# Patient Record
Sex: Female | Born: 1970 | Race: Black or African American | Hispanic: No | Marital: Single | Smoking: Current every day smoker
Health system: Southern US, Community
[De-identification: ages and names within clinical notes are randomized; demographics above are authoritative.]

## PROBLEM LIST (undated history)

## (undated) DIAGNOSIS — I10 Essential (primary) hypertension: Secondary | ICD-10-CM

## (undated) DIAGNOSIS — B029 Zoster without complications: Secondary | ICD-10-CM

## (undated) DIAGNOSIS — R51 Headache: Secondary | ICD-10-CM

## (undated) DIAGNOSIS — I639 Cerebral infarction, unspecified: Secondary | ICD-10-CM

## (undated) DIAGNOSIS — R519 Headache, unspecified: Secondary | ICD-10-CM

## (undated) DIAGNOSIS — E119 Type 2 diabetes mellitus without complications: Secondary | ICD-10-CM

## (undated) DIAGNOSIS — Z21 Asymptomatic human immunodeficiency virus [HIV] infection status: Secondary | ICD-10-CM

## (undated) DIAGNOSIS — I219 Acute myocardial infarction, unspecified: Secondary | ICD-10-CM

## (undated) DIAGNOSIS — B20 Human immunodeficiency virus [HIV] disease: Secondary | ICD-10-CM

---

## 2014-01-23 ENCOUNTER — Encounter (HOSPITAL_COMMUNITY): Payer: Self-pay | Admitting: Emergency Medicine

## 2014-01-23 ENCOUNTER — Emergency Department (HOSPITAL_COMMUNITY)
Admission: EM | Admit: 2014-01-23 | Discharge: 2014-01-23 | Discharge status: Against Medical Advice | Payer: Medicaid Other | Attending: Emergency Medicine | Admitting: Emergency Medicine

## 2014-01-23 DIAGNOSIS — I252 Old myocardial infarction: Secondary | ICD-10-CM | POA: Diagnosis not present

## 2014-01-23 DIAGNOSIS — I10 Essential (primary) hypertension: Secondary | ICD-10-CM | POA: Insufficient documentation

## 2014-01-23 DIAGNOSIS — Z720 Tobacco use: Secondary | ICD-10-CM | POA: Insufficient documentation

## 2014-01-23 DIAGNOSIS — M545 Low back pain: Secondary | ICD-10-CM | POA: Insufficient documentation

## 2014-01-23 DIAGNOSIS — M549 Dorsalgia, unspecified: Secondary | ICD-10-CM | POA: Diagnosis present

## 2014-01-23 DIAGNOSIS — E119 Type 2 diabetes mellitus without complications: Secondary | ICD-10-CM | POA: Diagnosis not present

## 2014-01-23 HISTORY — DX: Type 2 diabetes mellitus without complications: E11.9

## 2014-01-23 HISTORY — DX: Acute myocardial infarction, unspecified: I21.9

## 2014-01-23 HISTORY — DX: Cerebral infarction, unspecified: I63.9

## 2014-01-23 HISTORY — DX: Essential (primary) hypertension: I10

## 2014-01-23 LAB — URINALYSIS, ROUTINE W REFLEX MICROSCOPIC
Bilirubin Urine: NEGATIVE
Glucose, UA: NEGATIVE mg/dL
KETONES UR: NEGATIVE mg/dL
NITRITE: NEGATIVE
PROTEIN: 30 mg/dL — AB
Specific Gravity, Urine: 1.022 (ref 1.005–1.030)
Urobilinogen, UA: 0.2 mg/dL (ref 0.0–1.0)
pH: 5.5 (ref 5.0–8.0)

## 2014-01-23 LAB — CBC WITH DIFFERENTIAL/PLATELET
BASOS ABS: 0 10*3/uL (ref 0.0–0.1)
BASOS PCT: 0 % (ref 0–1)
EOS ABS: 0 10*3/uL (ref 0.0–0.7)
Eosinophils Relative: 1 % (ref 0–5)
HCT: 30.5 % — ABNORMAL LOW (ref 36.0–46.0)
Hemoglobin: 9.9 g/dL — ABNORMAL LOW (ref 12.0–15.0)
Lymphocytes Relative: 9 % — ABNORMAL LOW (ref 12–46)
Lymphs Abs: 0.4 10*3/uL — ABNORMAL LOW (ref 0.7–4.0)
MCH: 25 pg — AB (ref 26.0–34.0)
MCHC: 32.5 g/dL (ref 30.0–36.0)
MCV: 77 fL — ABNORMAL LOW (ref 78.0–100.0)
MONOS PCT: 8 % (ref 3–12)
Monocytes Absolute: 0.4 10*3/uL (ref 0.1–1.0)
NEUTROS ABS: 3.4 10*3/uL (ref 1.7–7.7)
NEUTROS PCT: 82 % — AB (ref 43–77)
Platelets: 343 10*3/uL (ref 150–400)
RBC: 3.96 MIL/uL (ref 3.87–5.11)
RDW: 16.3 % — AB (ref 11.5–15.5)
WBC: 4.2 10*3/uL (ref 4.0–10.5)

## 2014-01-23 LAB — COMPREHENSIVE METABOLIC PANEL
ALBUMIN: 3.4 g/dL — AB (ref 3.5–5.2)
ALK PHOS: 102 U/L (ref 39–117)
ALT: 10 U/L (ref 0–35)
AST: 20 U/L (ref 0–37)
Anion gap: 18 — ABNORMAL HIGH (ref 5–15)
BILIRUBIN TOTAL: 0.4 mg/dL (ref 0.3–1.2)
BUN: 18 mg/dL (ref 6–23)
CO2: 21 mEq/L (ref 19–32)
CREATININE: 1 mg/dL (ref 0.50–1.10)
Calcium: 9.8 mg/dL (ref 8.4–10.5)
Chloride: 97 mEq/L (ref 96–112)
GFR calc Af Amer: 78 mL/min — ABNORMAL LOW (ref >90)
GFR calc non Af Amer: 67 mL/min — ABNORMAL LOW (ref >90)
Glucose, Bld: 95 mg/dL (ref 70–99)
Potassium: 4.2 mEq/L (ref 3.7–5.3)
Sodium: 136 mEq/L — ABNORMAL LOW (ref 137–147)
TOTAL PROTEIN: 9.6 g/dL — AB (ref 6.0–8.3)

## 2014-01-23 LAB — URINE MICROSCOPIC-ADD ON

## 2014-01-23 LAB — LIPASE, BLOOD: LIPASE: 32 U/L (ref 11–59)

## 2014-01-23 MED ORDER — OXYCODONE-ACETAMINOPHEN 5-325 MG PO TABS
1.0000 | ORAL_TABLET | Freq: Once | ORAL | Status: AC
  Administered 2014-01-23: 1 via ORAL
  Filled 2014-01-23: qty 1

## 2014-01-23 MED ORDER — ONDANSETRON 4 MG PO TBDP
8.0000 mg | ORAL_TABLET | Freq: Once | ORAL | Status: AC
  Administered 2014-01-23: 8 mg via ORAL
  Filled 2014-01-23: qty 2

## 2014-01-23 NOTE — ED Notes (Signed)
The pt is c/o lower back pain since yesterday.  She has also had n and v all day

## 2014-01-23 NOTE — ED Notes (Signed)
The pt is hyperventilating 

## 2014-01-23 NOTE — ED Notes (Signed)
The pt takes percocet at home

## 2014-01-27 ENCOUNTER — Encounter (HOSPITAL_COMMUNITY): Payer: Self-pay | Admitting: Emergency Medicine

## 2014-01-27 ENCOUNTER — Emergency Department (HOSPITAL_COMMUNITY): Payer: Medicaid Other

## 2014-01-27 ENCOUNTER — Inpatient Hospital Stay (HOSPITAL_COMMUNITY)
Admission: EM | Admit: 2014-01-27 | Discharge: 2014-02-23 | DRG: 974 | Disposition: A | Discharge status: Home Health | Payer: Medicaid Other | Attending: Internal Medicine | Admitting: Internal Medicine

## 2014-01-27 DIAGNOSIS — D509 Iron deficiency anemia, unspecified: Secondary | ICD-10-CM | POA: Diagnosis present

## 2014-01-27 DIAGNOSIS — E872 Acidosis, unspecified: Secondary | ICD-10-CM

## 2014-01-27 DIAGNOSIS — I252 Old myocardial infarction: Secondary | ICD-10-CM | POA: Diagnosis not present

## 2014-01-27 DIAGNOSIS — A0839 Other viral enteritis: Secondary | ICD-10-CM | POA: Diagnosis present

## 2014-01-27 DIAGNOSIS — E1165 Type 2 diabetes mellitus with hyperglycemia: Secondary | ICD-10-CM | POA: Diagnosis present

## 2014-01-27 DIAGNOSIS — R000 Tachycardia, unspecified: Secondary | ICD-10-CM | POA: Insufficient documentation

## 2014-01-27 DIAGNOSIS — L89152 Pressure ulcer of sacral region, stage 2: Secondary | ICD-10-CM | POA: Diagnosis present

## 2014-01-27 DIAGNOSIS — I10 Essential (primary) hypertension: Secondary | ICD-10-CM

## 2014-01-27 DIAGNOSIS — G0490 Encephalitis and encephalomyelitis, unspecified: Secondary | ICD-10-CM

## 2014-01-27 DIAGNOSIS — B59 Pneumocystosis: Secondary | ICD-10-CM | POA: Diagnosis present

## 2014-01-27 DIAGNOSIS — G969 Disorder of central nervous system, unspecified: Secondary | ICD-10-CM

## 2014-01-27 DIAGNOSIS — B258 Other cytomegaloviral diseases: Secondary | ICD-10-CM | POA: Diagnosis present

## 2014-01-27 DIAGNOSIS — B20 Human immunodeficiency virus [HIV] disease: Secondary | ICD-10-CM

## 2014-01-27 DIAGNOSIS — F17210 Nicotine dependence, cigarettes, uncomplicated: Secondary | ICD-10-CM | POA: Diagnosis present

## 2014-01-27 DIAGNOSIS — E875 Hyperkalemia: Secondary | ICD-10-CM | POA: Diagnosis present

## 2014-01-27 DIAGNOSIS — J690 Pneumonitis due to inhalation of food and vomit: Secondary | ICD-10-CM

## 2014-01-27 DIAGNOSIS — I248 Other forms of acute ischemic heart disease: Secondary | ICD-10-CM | POA: Diagnosis present

## 2014-01-27 DIAGNOSIS — R64 Cachexia: Secondary | ICD-10-CM | POA: Diagnosis present

## 2014-01-27 DIAGNOSIS — R509 Fever, unspecified: Secondary | ICD-10-CM

## 2014-01-27 DIAGNOSIS — N179 Acute kidney failure, unspecified: Secondary | ICD-10-CM

## 2014-01-27 DIAGNOSIS — I5033 Acute on chronic diastolic (congestive) heart failure: Secondary | ICD-10-CM | POA: Diagnosis present

## 2014-01-27 DIAGNOSIS — Z0189 Encounter for other specified special examinations: Secondary | ICD-10-CM

## 2014-01-27 DIAGNOSIS — R451 Restlessness and agitation: Secondary | ICD-10-CM | POA: Diagnosis present

## 2014-01-27 DIAGNOSIS — G9349 Other encephalopathy: Secondary | ICD-10-CM | POA: Diagnosis present

## 2014-01-27 DIAGNOSIS — A312 Disseminated mycobacterium avium-intracellulare complex (DMAC): Secondary | ICD-10-CM | POA: Diagnosis present

## 2014-01-27 DIAGNOSIS — R54 Age-related physical debility: Secondary | ICD-10-CM | POA: Diagnosis present

## 2014-01-27 DIAGNOSIS — B370 Candidal stomatitis: Secondary | ICD-10-CM

## 2014-01-27 DIAGNOSIS — E870 Hyperosmolality and hypernatremia: Secondary | ICD-10-CM | POA: Diagnosis present

## 2014-01-27 DIAGNOSIS — R4182 Altered mental status, unspecified: Secondary | ICD-10-CM | POA: Diagnosis present

## 2014-01-27 DIAGNOSIS — Z515 Encounter for palliative care: Secondary | ICD-10-CM | POA: Diagnosis not present

## 2014-01-27 DIAGNOSIS — R29810 Facial weakness: Secondary | ICD-10-CM | POA: Diagnosis present

## 2014-01-27 DIAGNOSIS — K209 Esophagitis, unspecified: Secondary | ICD-10-CM | POA: Diagnosis present

## 2014-01-27 DIAGNOSIS — G40909 Epilepsy, unspecified, not intractable, without status epilepticus: Secondary | ICD-10-CM | POA: Diagnosis present

## 2014-01-27 DIAGNOSIS — I4892 Unspecified atrial flutter: Secondary | ICD-10-CM | POA: Diagnosis present

## 2014-01-27 DIAGNOSIS — B004 Herpesviral encephalitis: Principal | ICD-10-CM

## 2014-01-27 DIAGNOSIS — E11649 Type 2 diabetes mellitus with hypoglycemia without coma: Secondary | ICD-10-CM | POA: Diagnosis present

## 2014-01-27 DIAGNOSIS — Z681 Body mass index (BMI) 19 or less, adult: Secondary | ICD-10-CM | POA: Diagnosis not present

## 2014-01-27 DIAGNOSIS — I129 Hypertensive chronic kidney disease with stage 1 through stage 4 chronic kidney disease, or unspecified chronic kidney disease: Secondary | ICD-10-CM | POA: Diagnosis present

## 2014-01-27 DIAGNOSIS — N280 Ischemia and infarction of kidney: Secondary | ICD-10-CM | POA: Diagnosis present

## 2014-01-27 DIAGNOSIS — R6510 Systemic inflammatory response syndrome (SIRS) of non-infectious origin without acute organ dysfunction: Secondary | ICD-10-CM | POA: Diagnosis present

## 2014-01-27 DIAGNOSIS — R0602 Shortness of breath: Secondary | ICD-10-CM

## 2014-01-27 DIAGNOSIS — J9601 Acute respiratory failure with hypoxia: Secondary | ICD-10-CM | POA: Diagnosis not present

## 2014-01-27 DIAGNOSIS — E43 Unspecified severe protein-calorie malnutrition: Secondary | ICD-10-CM | POA: Diagnosis present

## 2014-01-27 DIAGNOSIS — E860 Dehydration: Secondary | ICD-10-CM

## 2014-01-27 DIAGNOSIS — F1290 Cannabis use, unspecified, uncomplicated: Secondary | ICD-10-CM | POA: Diagnosis present

## 2014-01-27 DIAGNOSIS — R29898 Other symptoms and signs involving the musculoskeletal system: Secondary | ICD-10-CM

## 2014-01-27 DIAGNOSIS — J189 Pneumonia, unspecified organism: Secondary | ICD-10-CM

## 2014-01-27 DIAGNOSIS — N170 Acute kidney failure with tubular necrosis: Secondary | ICD-10-CM | POA: Diagnosis present

## 2014-01-27 DIAGNOSIS — N189 Chronic kidney disease, unspecified: Secondary | ICD-10-CM | POA: Diagnosis present

## 2014-01-27 DIAGNOSIS — R569 Unspecified convulsions: Secondary | ICD-10-CM

## 2014-01-27 DIAGNOSIS — G9340 Encephalopathy, unspecified: Secondary | ICD-10-CM

## 2014-01-27 DIAGNOSIS — N1330 Unspecified hydronephrosis: Secondary | ICD-10-CM

## 2014-01-27 DIAGNOSIS — Z9911 Dependence on respirator [ventilator] status: Secondary | ICD-10-CM

## 2014-01-27 DIAGNOSIS — Z9289 Personal history of other medical treatment: Secondary | ICD-10-CM

## 2014-01-27 DIAGNOSIS — R4189 Other symptoms and signs involving cognitive functions and awareness: Secondary | ICD-10-CM

## 2014-01-27 DIAGNOSIS — J9600 Acute respiratory failure, unspecified whether with hypoxia or hypercapnia: Secondary | ICD-10-CM

## 2014-01-27 DIAGNOSIS — R739 Hyperglycemia, unspecified: Secondary | ICD-10-CM | POA: Diagnosis present

## 2014-01-27 DIAGNOSIS — R404 Transient alteration of awareness: Secondary | ICD-10-CM

## 2014-01-27 HISTORY — DX: Headache, unspecified: R51.9

## 2014-01-27 HISTORY — DX: Asymptomatic human immunodeficiency virus (hiv) infection status: Z21

## 2014-01-27 HISTORY — DX: Human immunodeficiency virus (HIV) disease: B20

## 2014-01-27 HISTORY — DX: Headache: R51

## 2014-01-27 HISTORY — DX: Type 2 diabetes mellitus without complications: E11.9

## 2014-01-27 HISTORY — DX: Zoster without complications: B02.9

## 2014-01-27 LAB — I-STAT CG4 LACTIC ACID, ED
Lactic Acid, Venous: 3.65 mmol/L — ABNORMAL HIGH (ref 0.5–2.2)
Lactic Acid, Venous: 1.57 mmol/L (ref 0.5–2.2)

## 2014-01-27 LAB — COMPREHENSIVE METABOLIC PANEL
ALT: 8 U/L (ref 0–35)
AST: 15 U/L (ref 0–37)
Albumin: 3.4 g/dL — ABNORMAL LOW (ref 3.5–5.2)
Alkaline Phosphatase: 96 U/L (ref 39–117)
Anion gap: 27 — ABNORMAL HIGH (ref 5–15)
BUN: 75 mg/dL — ABNORMAL HIGH (ref 6–23)
CO2: 16 mEq/L — ABNORMAL LOW (ref 19–32)
Calcium: 10.6 mg/dL — ABNORMAL HIGH (ref 8.4–10.5)
Chloride: 94 mEq/L — ABNORMAL LOW (ref 96–112)
Creatinine, Ser: 3.07 mg/dL — ABNORMAL HIGH (ref 0.50–1.10)
GFR calc Af Amer: 10 mL/min — ABNORMAL LOW (ref >90)
GFR calc non Af Amer: 9 mL/min — ABNORMAL LOW (ref >90)
Glucose, Bld: 147 mg/dL — ABNORMAL HIGH (ref 70–99)
Potassium: 5 mEq/L (ref 3.7–5.3)
SODIUM: 137 meq/L (ref 137–147)
TOTAL PROTEIN: 9.8 g/dL — AB (ref 6.0–8.3)
Total Bilirubin: 0.4 mg/dL (ref 0.3–1.2)

## 2014-01-27 LAB — CK: CK TOTAL: 58 U/L (ref 7–177)

## 2014-01-27 LAB — CBC WITH DIFFERENTIAL/PLATELET
BASOS ABS: 0 10*3/uL (ref 0.0–0.1)
Basophils Relative: 0 % (ref 0–1)
EOS PCT: 0 % (ref 0–5)
Eosinophils Absolute: 0 10*3/uL (ref 0.0–0.7)
HEMATOCRIT: 32.8 % — AB (ref 36.0–46.0)
Hemoglobin: 10.9 g/dL — ABNORMAL LOW (ref 12.0–15.0)
LYMPHS PCT: 8 % — AB (ref 12–46)
Lymphs Abs: 0.4 10*3/uL — ABNORMAL LOW (ref 0.7–4.0)
MCH: 24.9 pg — ABNORMAL LOW (ref 26.0–34.0)
MCHC: 33.2 g/dL (ref 30.0–36.0)
MCV: 74.9 fL — AB (ref 78.0–100.0)
Monocytes Absolute: 0.6 10*3/uL (ref 0.1–1.0)
Monocytes Relative: 11 % (ref 3–12)
Neutro Abs: 4.5 10*3/uL (ref 1.7–7.7)
Neutrophils Relative %: 81 % — ABNORMAL HIGH (ref 43–77)
PLATELETS: 383 10*3/uL (ref 150–400)
RBC: 4.38 MIL/uL (ref 3.87–5.11)
RDW: 16.2 % — AB (ref 11.5–15.5)
WBC: 5.6 10*3/uL (ref 4.0–10.5)

## 2014-01-27 LAB — URINALYSIS, ROUTINE W REFLEX MICROSCOPIC
Bilirubin Urine: NEGATIVE
GLUCOSE, UA: NEGATIVE mg/dL
KETONES UR: NEGATIVE mg/dL
Leukocytes, UA: NEGATIVE
NITRITE: NEGATIVE
PH: 5 (ref 5.0–8.0)
Protein, ur: 100 mg/dL — AB
SPECIFIC GRAVITY, URINE: 1.019 (ref 1.005–1.030)
Urobilinogen, UA: 0.2 mg/dL (ref 0.0–1.0)

## 2014-01-27 LAB — I-STAT TROPONIN, ED: Troponin i, poc: 0.07 ng/mL (ref 0.00–0.08)

## 2014-01-27 LAB — URINE MICROSCOPIC-ADD ON

## 2014-01-27 LAB — PROTIME-INR
INR: 1.06 (ref 0.00–1.49)
Prothrombin Time: 13.9 seconds (ref 11.6–15.2)

## 2014-01-27 LAB — ETHANOL: Alcohol, Ethyl (B): <11 mg/dL (ref 0–11)

## 2014-01-27 LAB — TSH
TSH: 1.69 u[IU]/mL (ref 0.350–4.500)
TSH: 0.883 u[IU]/mL (ref 0.350–4.500)

## 2014-01-27 LAB — GLUCOSE, CAPILLARY: Glucose-Capillary: 108 mg/dL — ABNORMAL HIGH (ref 70–99)

## 2014-01-27 LAB — RAPID URINE DRUG SCREEN, HOSP PERFORMED
Amphetamines: NOT DETECTED
Barbiturates: NOT DETECTED
Benzodiazepines: NOT DETECTED
COCAINE: NOT DETECTED
Opiates: NOT DETECTED
Tetrahydrocannabinol: POSITIVE — AB

## 2014-01-27 LAB — AMMONIA: Ammonia: 21 umol/L (ref 11–60)

## 2014-01-27 LAB — TROPONIN I: Troponin I: 0.58 ng/mL (ref <0.30)

## 2014-01-27 LAB — CBG MONITORING, ED: Glucose-Capillary: 168 mg/dL — ABNORMAL HIGH (ref 70–99)

## 2014-01-27 MED ORDER — SODIUM CHLORIDE 0.9 % IV SOLN
INTRAVENOUS | Status: DC
  Administered 2014-01-27: 1000 mL via INTRAVENOUS
  Administered 2014-01-28 – 2014-01-29 (×2): via INTRAVENOUS

## 2014-01-27 MED ORDER — ONDANSETRON HCL 4 MG PO TABS: 4.0000 mg | ORAL_TABLET | Freq: Four times a day (QID) | ORAL | Status: DC | PRN

## 2014-01-27 MED ORDER — SODIUM CHLORIDE 0.9 % IJ SOLN
3.0000 mL | Freq: Two times a day (BID) | INTRAMUSCULAR | Status: DC
  Administered 2014-01-28 – 2014-02-07 (×16): 3 mL via INTRAVENOUS

## 2014-01-27 MED ORDER — SODIUM CHLORIDE 0.9 % IV BOLUS (SEPSIS)
1000.0000 mL | Freq: Once | INTRAVENOUS | Status: AC
  Administered 2014-01-27: 1000 mL via INTRAVENOUS

## 2014-01-27 MED ORDER — METOPROLOL TARTRATE 1 MG/ML IV SOLN
5.0000 mg | Freq: Once | INTRAVENOUS | Status: AC
  Administered 2014-01-27: 5 mg via INTRAVENOUS
  Filled 2014-01-27: qty 5

## 2014-01-27 MED ORDER — INSULIN ASPART 100 UNIT/ML ~~LOC~~ SOLN: 0.0000 [IU] | Freq: Three times a day (TID) | SUBCUTANEOUS | Status: DC

## 2014-01-27 MED ORDER — HYDRALAZINE HCL 20 MG/ML IJ SOLN
10.0000 mg | Freq: Four times a day (QID) | INTRAMUSCULAR | Status: DC | PRN
  Administered 2014-01-27 – 2014-01-29 (×4): 10 mg via INTRAVENOUS
  Filled 2014-01-27 (×4): qty 1

## 2014-01-27 MED ORDER — ENOXAPARIN SODIUM 40 MG/0.4ML ~~LOC~~ SOLN
40.0000 mg | SUBCUTANEOUS | Status: DC
  Administered 2014-01-27: 40 mg via SUBCUTANEOUS
  Filled 2014-01-27: qty 0.4

## 2014-01-27 MED ORDER — ONDANSETRON HCL 4 MG/2ML IJ SOLN: 4.0000 mg | Freq: Four times a day (QID) | INTRAMUSCULAR | Status: DC | PRN

## 2014-01-27 MED ORDER — SODIUM CHLORIDE 0.9 % IV SOLN
Freq: Once | INTRAVENOUS | Status: AC
  Administered 2014-01-27: 18:00:00 via INTRAVENOUS

## 2014-01-27 MED ORDER — STROKE: EARLY STAGES OF RECOVERY BOOK
Freq: Once | Status: AC
  Administered 2014-01-27
  Filled 2014-01-27: qty 1

## 2014-01-27 MED ORDER — ASPIRIN 300 MG RE SUPP
300.0000 mg | Freq: Every day | RECTAL | Status: DC
  Administered 2014-01-27 – 2014-01-29 (×3): 300 mg via RECTAL
  Filled 2014-01-27 (×7): qty 1

## 2014-01-27 NOTE — ED Notes (Signed)
Patient transported to CT 

## 2014-01-27 NOTE — ED Notes (Signed)
Pt here from home found in floor of bedroom unresponsive, pt awake  But not talking on arrival lsn on Monday

## 2014-01-27 NOTE — ED Notes (Signed)
Family at bedside with MD

## 2014-01-27 NOTE — Progress Notes (Signed)
Patient troponin level 0.58. Notified by lab. MD notified.

## 2014-01-27 NOTE — Consult Note (Addendum)
Referring Physician: Izola PriceMYERS, I    Chief Complaint: Loss of consciousness with possible seizure as well as no speech output.  HPI: Desiree Hale is an 43 y.o. female with a history of hypertension and diabetes mellitus as well as marijuana use, brought to the emergency room following an episode of loss of consciousness and possible seizure. Patient was found unresponsive by a family member with convulsive-like movements. Patient has been unable to speak since regaining consciousness. CT scan of her head showed no acute intracranial abnormality. Patient was noted to be markedly cachectic and dehydrated. Blood sugar was 147. Hemoglobin A1c is pending. Urine drug screen was positive for THC. Ammonia level was normal.  LSN: Unclear from history obtained from family except she was able to speak at 4:00: 01/26/2014. tPA Given: No: Unclear when last seen normal mRankin:  History reviewed. No pertinent past medical history.  No family history on file.   Medications: I have reviewed the patient's current medications.  ROS: Unavailable due to patient's mental status.  Physical Examination: Blood pressure 161/94, pulse 124, temperature 97.8 F (36.6 C), temperature source Axillary, resp. rate 18, height 5\' 7"  (1.702 m), weight 40.2 kg (88 lb 10 oz), SpO2 100.00%.  Neurologic Examination: Patient was alert but lethargic. She had no verbal output and was unable to follow commands. She appear to be moderately agitated. Pupils were equal and reacted normally to light. Extraocular movements were full on right left lateral gaze. Unable to adequately assess visual fields. She appear to have moderate right lower facial weakness. She was diffusely tremulous with slight increase in muscle tone throughout. She also appeared to have mild asterixis bilaterally. Resistance to passive movement was symmetrical. Withdrawal movements to noxious stimuli were equal. Responses to sensory testing unreliable. Deep tendon  reflexes were 1+ and symmetrical. Plantar responses were flexor bilaterally.  Ct Head Wo Contrast  01/27/2014   CLINICAL DATA:  Found unresponsive.  Altered mental status.  EXAM: CT HEAD WITHOUT CONTRAST  CT CERVICAL SPINE WITHOUT CONTRAST  TECHNIQUE: Multidetector CT imaging of the head and cervical spine was performed following the standard protocol without intravenous contrast. Multiplanar CT image reconstructions of the cervical spine were also generated.  COMPARISON:  None.  FINDINGS: CT HEAD FINDINGS  The ventricles are normal in size and configuration. No extra-axial fluid collections are identified. The gray-white differentiation is normal. No CT findings for acute intracranial process such as hemorrhage or infarction. No mass lesions. The brainstem and cerebellum are grossly normal.  The bony structures are intact. The paranasal sinuses and mastoid air cells are clear. The globes are intact.  CT CERVICAL SPINE FINDINGS  Normal alignment of the cervical vertebral bodies. Disc spaces and vertebral bodies are maintained. No acute fracture or abnormal prevertebral soft tissue swelling. The facets are normally aligned. The skullbase C1 and C1-2 articulations are maintained. The dens is normal. No large disc protrusions, spinal or foraminal stenosis. The lung apices are clear. Emphysematous changes are noted. There is some air in the supraclavicular fossa possibly due to a laceration. No pneumothorax is identified.  IMPRESSION: No acute intracranial findings or skull fracture.  Normal CT examination of the cervical spine. No cervical spine fracture.   Electronically Signed   By: Loralie ChampagneMark  Gallerani M.D.   On: 01/27/2014 17:27   Ct Cervical Spine Wo Contrast  01/27/2014   CLINICAL DATA:  Found unresponsive.  Altered mental status.  EXAM: CT HEAD WITHOUT CONTRAST  CT CERVICAL SPINE WITHOUT CONTRAST  TECHNIQUE: Multidetector CT imaging of  the head and cervical spine was performed following the standard protocol  without intravenous contrast. Multiplanar CT image reconstructions of the cervical spine were also generated.  COMPARISON:  None.  FINDINGS: CT HEAD FINDINGS  The ventricles are normal in size and configuration. No extra-axial fluid collections are identified. The gray-white differentiation is normal. No CT findings for acute intracranial process such as hemorrhage or infarction. No mass lesions. The brainstem and cerebellum are grossly normal.  The bony structures are intact. The paranasal sinuses and mastoid air cells are clear. The globes are intact.  CT CERVICAL SPINE FINDINGS  Normal alignment of the cervical vertebral bodies. Disc spaces and vertebral bodies are maintained. No acute fracture or abnormal prevertebral soft tissue swelling. The facets are normally aligned. The skullbase C1 and C1-2 articulations are maintained. The dens is normal. No large disc protrusions, spinal or foraminal stenosis. The lung apices are clear. Emphysematous changes are noted. There is some air in the supraclavicular fossa possibly due to a laceration. No pneumothorax is identified.  IMPRESSION: No acute intracranial findings or skull fracture.  Normal CT examination of the cervical spine. No cervical spine fracture.   Electronically Signed   By: Loralie ChampagneMark  Gallerani M.D.   On: 01/27/2014 17:27   Dg Chest Portable 1 View  01/27/2014   CLINICAL DATA:  Altered mental status  EXAM: PORTABLE CHEST - 1 VIEW  COMPARISON:  None.  FINDINGS: The patient is rotated. There is no focal parenchymal opacity, pleural effusion, or pneumothorax. The heart and mediastinal contours are unremarkable.  The osseous structures are unremarkable.  IMPRESSION: No active disease.   Electronically Signed   By: Elige KoHetal  Patel   On: 01/27/2014 16:17    Assessment: 43 y.o. female with multiple risk factors for stroke presenting with acute encephalopathy, with possible acute left MCA territory cerebral infarction as well as loss of consciousness with possible  seizure activity, and marked dehydration.  Stroke Risk Factors - diabetes mellitus and hypertension  Plan: 1. HgbA1c, fasting lipid panel 2. MRI, MRA  of the brain without contrast 3. PT consult, OT consult, Speech consult 4. Echocardiogram 5. Carotid dopplers 6. Prophylactic therapy-Antiplatelet med: Aspirin 325 mg per day by mouth for 300 mg suppository daily 7. EEG, routine adult study   C.R. Roseanne RenoStewart, MD [facility] [phone]  01/27/2014, 8:20 PM

## 2014-01-27 NOTE — H&P (Addendum)
[facility] History and Physical  Beryle BeamsKristy Valletta RUE:[id number]:[id number] DOB: 1970/04/19 DOA: 01/27/2014  Referring physician: ED physician PCP: No primary provider on file.   Chief Complaint: AMS  HPI:  43 year old female with past medical history of hypertension, marijuana use who [facility] 01/27/2014 after she was found unresponsive at home by a family member today. Patient was found shaking, lying on the floor, unable to speak. Per family members she was covered in urine and feces. Patient's son at the bedside says he spoke with her over the phone at 4 PM one day prior to the admission and patient seemed to be fine. Patient is not able to provide history of present illness and family members at the bedside at this time were not the ones who found her unresponsive at home. There is no respiratory distress at this time. No fevers. No vomiting. No reports of blood in the stool or urine. In ED, blood pressure is 160/112, heart rate 120, Tmax 98.7F and oxygen saturation 100% on room air. Blood work revealed hemoglobin of 10.9, CO2 16, creatinine 3.07, calcium 10.6, glucose 147. CT head shows no acute intracranial findings. Chest x-ray showed no active disease. Patient has received fluid boluses in ED. Patient was admitted for further evaluation and management of altered mental status, dehydration.   Assessment and Plan:  Principal Problem: Acute encephalopathy / dehydration / possible acute CVA  Unclear etiology. CT head did not show acute intracranial findings. UDS positive for THC. Alcohol level within normal limits. Ammonia level within normal limits.   No evidence of infection based on urinalysis a chest x-ray.   Patient is alert at this time but still unable to speak. Obvious concern is for possible stroke. Stroke order set placed. Followup MRI brain, carotid Doppler, 2-D echo. Followup A1c, lipid panel.  PT/OT evaluation once patient able to participate.  Provide supportive care  with IV fluids. Active Problems:   HTN (hypertension)  Added Hydralazine 10 mg IV every 6 hours for better blood pressure control.    Metabolic acidosis / Acute renal failure  Likely prerenal etiology, dehydration   Continue IV fluids started in ED.  Followup BMP in the morning   Hyperglycemia  Glucose 147 on the admission. Patient started on sliding scale insulin.    Hypercalcemia   Likely because of dehydration. Continue IV fluids. Followup calcium level in the morning.   Microcytic anemia   hemoglobin 10.9 on the admission. No evidence of bleeding. Continue to monitor CBC.  Radiological Exams on Admission:  Ct Head Wo Contrast  01/27/2014   No acute intracranial findings or skull fracture.  Normal CT examination of the cervical spine. No cervical spine fracture.    Ct Cervical Spine Wo Contrast  01/27/2014   No acute intracranial findings or skull fracture.  Normal CT examination of the cervical spine. No cervical spine fracture.    Dg Chest Portable 1 View  01/27/2014  No active disease.    Code Status: Full Family Communication: Pt at bedside Disposition Plan: Admit for further evaluation     Review of Systems:  Unable to obtain due to AMS    Unknown past medical history  Unknown past family history due to AMS   Social History:  has no tobacco, alcohol, and drug history on file.  NKDA   Prior to Admission medications   Not on File    Physical Exam: Filed Vitals:   01/27/14 1510  BP: 160/112  Pulse: 140  Temp: 98.1 F (  36.7 C)  TempSrc: Rectal  Resp: 26  SpO2: 100%    Physical Exam  Constitutional: Appears malnourished, in no acute distress HENT: Normocephalic. External right and left ear normal.Dry MM Eyes: Conjunctivae are pale. PERRLA, no scleral icterus.  Neck: Normal ROM. Neck supple. No JVD. No tracheal deviation. No thyromegaly.  CVS: RRR, S1/S2 +, no murmurs, no gallops, no carotid bruit.  Pulmonary: Effort and breath sounds normal, no  stridor, rhonchi, wheezes, rales.  Abdominal: Soft. BS +,  no distension, tenderness, rebound or guarding.  Musculoskeletal: Normal range of motion. No edema and no tenderness.  Lymphadenopathy: No lymphadenopathy noted, cervical, inguinal. Neuro: Alert. No focal neurological deficits. Skin: Skin is warm and dry. No rash noted. Not diaphoretic. No erythema. No pallor.  Psychiatric: Unable to accurately assess due to mental status changes  Labs on Admission:  Basic Metabolic Panel:  Recent Labs Lab 01/27/14 1512  NA 137  K 5.0  CL 94*  CO2 16*  GLUCOSE 147*  BUN 75*  CREATININE 3.07*  CALCIUM 10.6*   Liver Function Tests:  Recent Labs Lab 01/27/14 1512  AST 15  ALT 8  ALKPHOS 96  BILITOT 0.4  PROT 9.8*  ALBUMIN 3.4*    Recent Labs Lab 01/27/14 1514  AMMONIA 21   CBC:  Recent Labs Lab 01/27/14 1512  WBC 5.6  NEUTROABS 4.5  HGB 10.9*  HCT 32.8*  MCV 74.9*  PLT 383   Cardiac Enzymes:  Recent Labs Lab 01/27/14 1524  CKTOTAL 58   CBG:  Recent Labs Lab 01/27/14 1512  GLUCAP 168*    Debbora PrestoMAGICK-Kerri Asche, MD  [facility] Pager [phone]  If 7PM-7AM, please contact night-coverage [email or web address] Password [email or web address] 01/27/2014, 6:19 PM

## 2014-01-27 NOTE — Progress Notes (Signed)
Patient arrive to 4N29 via stretcher from the ED. Patient transferred to bed and assessed. Patient unable to answer any questions. Heart rate tachycardic. Telemetry placed. Husband in room and oriented to room and unit. Will continue to monitor patient closely. Eliezer ChampagneShakenna Idara Woodside,RN

## 2014-01-27 NOTE — ED Provider Notes (Signed)
CSN: [id number]     Arrival date & time 01/27/14  1506 History   First MD Initiated Contact with Patient 01/27/14 1511     Chief Complaint  Patient presents with  . Altered Mental Status     (Consider location/radiation/quality/duration/timing/severity/associated sxs/prior Treatment) Patient is a [age] y.o. female presenting with altered mental status.  Altered Mental Status Presenting symptoms: confusion and partial responsiveness   Severity:  Severe Most recent episode:  2 days ago Episode history:  Continuous Duration:  2 days Timing:  Constant Progression:  Unchanged Chronicity:  New Context comment:  Unknown history Associated symptoms comment:  Non verbal   History reviewed. No pertinent past medical history. History reviewed. No pertinent past surgical history. No family history on file. History  Substance Use Topics  . Smoking status: Not on file  . Smokeless tobacco: Not on file  . Alcohol Use: Not on file   OB History   Grav Para Term Preterm Abortions TAB SAB Ect Mult Living                 Review of Systems  Unable to perform ROS: Acuity of condition  Psychiatric/Behavioral: Positive for confusion.      Allergies  Review of patient's allergies indicates no known allergies.  Home Medications   Prior to Admission medications   Not on File   BP 160/112  Pulse 140  Temp(Src) 98.1 F (36.7 C) (Rectal)  Resp 26  SpO2 100% Physical Exam  Vitals reviewed. Constitutional: She appears lethargic. She appears cachectic. She has a sickly appearance. She appears distressed. Face mask in place.  HENT:  Head: Normocephalic and atraumatic.  Eyes: EOM are normal. Pupils are equal, round, and reactive to light.  Neck: Neck supple.  Cardiovascular: Regular rhythm and normal heart sounds.  Tachycardia present.   No murmur heard. Pulmonary/Chest: Effort normal and breath sounds normal. No stridor. No respiratory distress. She has no wheezes.  Abdominal: Soft.  Bowel sounds are normal. She exhibits no distension. There is no tenderness.  Musculoskeletal: She exhibits no edema.  Neurological: She appears lethargic. GCS eye subscore is 4. GCS verbal subscore is 1. GCS motor subscore is 5.  Skin: Skin is warm and dry. She is not diaphoretic.    ED Course  Procedures (including critical care time) Labs Review Labs Reviewed  CBC WITH DIFFERENTIAL - Abnormal; Notable for the following:    Hemoglobin 10.9 (*)    HCT 32.8 (*)    MCV 74.9 (*)    MCH 24.9 (*)    RDW 16.2 (*)    Neutrophils Relative % 81 (*)    Lymphocytes Relative 8 (*)    Lymphs Abs 0.4 (*)    All other components within normal limits  COMPREHENSIVE METABOLIC PANEL - Abnormal; Notable for the following:    Chloride 94 (*)    CO2 16 (*)    Glucose, Bld 147 (*)    BUN 75 (*)    Creatinine, Ser 3.07 (*)    Calcium 10.6 (*)    Total Protein 9.8 (*)    Albumin 3.4 (*)    GFR calc non Af Amer 9 (*)    GFR calc Af Amer 10 (*)    Anion gap 27 (*)    All other components within normal limits  URINALYSIS, ROUTINE W REFLEX MICROSCOPIC - Abnormal; Notable for the following:    Hgb urine dipstick TRACE (*)    Protein, ur 100 (*)    All other components  within normal limits  URINE RAPID DRUG SCREEN (HOSP PERFORMED) - Abnormal; Notable for the following:    Tetrahydrocannabinol POSITIVE (*)    All other components within normal limits  URINE MICROSCOPIC-ADD ON - Abnormal; Notable for the following:    Squamous Epithelial / LPF FEW (*)    Bacteria, UA FEW (*)    Casts GRANULAR CAST (*)    All other components within normal limits  CBG MONITORING, ED - Abnormal; Notable for the following:    Glucose-Capillary 168 (*)    All other components within normal limits  I-STAT CG4 LACTIC ACID, ED - Abnormal; Notable for the following:    Lactic Acid, Venous 3.65 (*)    All other components within normal limits  URINE CULTURE  AMMONIA  ETHANOL  PROTIME-INR  CK  TSH  I-STAT BETA HCG  BLOOD, ED ([facility], [facility], [facility] ONLY)  I-STAT TROPOININ, ED  I-STAT CG4 LACTIC ACID, ED    Imaging Review Ct Head Wo Contrast  01/27/2014   CLINICAL DATA:  Found unresponsive.  Altered mental status.  EXAM: CT HEAD WITHOUT CONTRAST  CT CERVICAL SPINE WITHOUT CONTRAST  TECHNIQUE: Multidetector CT imaging of the head and cervical spine was performed following the standard protocol without intravenous contrast. Multiplanar CT image reconstructions of the cervical spine were also generated.  COMPARISON:  None.  FINDINGS: CT HEAD FINDINGS  The ventricles are normal in size and configuration. No extra-axial fluid collections are identified. The gray-white differentiation is normal. No CT findings for acute intracranial process such as hemorrhage or infarction. No mass lesions. The brainstem and cerebellum are grossly normal.  The bony structures are intact. The paranasal sinuses and mastoid air cells are clear. The globes are intact.  CT CERVICAL SPINE FINDINGS  Normal alignment of the cervical vertebral bodies. Disc spaces and vertebral bodies are maintained. No acute fracture or abnormal prevertebral soft tissue swelling. The facets are normally aligned. The skullbase C1 and C1-2 articulations are maintained. The dens is normal. No large disc protrusions, spinal or foraminal stenosis. The lung apices are clear. Emphysematous changes are noted. There is some air in the supraclavicular fossa possibly due to a laceration. No pneumothorax is identified.  IMPRESSION: No acute intracranial findings or skull fracture.  Normal CT examination of the cervical spine. No cervical spine fracture.   Electronically Signed   By: Loralie ChampagneMark  Gallerani M.D.   On: 01/27/2014 17:27   Ct Cervical Spine Wo Contrast  01/27/2014   CLINICAL DATA:  Found unresponsive.  Altered mental status.  EXAM: CT HEAD WITHOUT CONTRAST  CT CERVICAL SPINE WITHOUT CONTRAST  TECHNIQUE: Multidetector CT imaging of the head and cervical spine was performed following  the standard protocol without intravenous contrast. Multiplanar CT image reconstructions of the cervical spine were also generated.  COMPARISON:  None.  FINDINGS: CT HEAD FINDINGS  The ventricles are normal in size and configuration. No extra-axial fluid collections are identified. The gray-white differentiation is normal. No CT findings for acute intracranial process such as hemorrhage or infarction. No mass lesions. The brainstem and cerebellum are grossly normal.  The bony structures are intact. The paranasal sinuses and mastoid air cells are clear. The globes are intact.  CT CERVICAL SPINE FINDINGS  Normal alignment of the cervical vertebral bodies. Disc spaces and vertebral bodies are maintained. No acute fracture or abnormal prevertebral soft tissue swelling. The facets are normally aligned. The skullbase C1 and C1-2 articulations are maintained. The dens is normal. No large disc protrusions, spinal or foraminal  stenosis. The lung apices are clear. Emphysematous changes are noted. There is some air in the supraclavicular fossa possibly due to a laceration. No pneumothorax is identified.  IMPRESSION: No acute intracranial findings or skull fracture.  Normal CT examination of the cervical spine. No cervical spine fracture.   Electronically Signed   By: Loralie Champagne M.D.   On: 01/27/2014 17:27   Dg Chest Portable 1 View  01/27/2014   CLINICAL DATA:  Altered mental status  EXAM: PORTABLE CHEST - 1 VIEW  COMPARISON:  None.  FINDINGS: The patient is rotated. There is no focal parenchymal opacity, pleural effusion, or pneumothorax. The heart and mediastinal contours are unremarkable.  The osseous structures are unremarkable.  IMPRESSION: No active disease.   Electronically Signed   By: Elige Ko   On: 01/27/2014 16:17     EKG Interpretation None      MDM   Final diagnoses:  Altered mental status, unspecified altered mental status type  Dehydration  AKI (acute kidney injury)    Patient is a  female that is estimated to be 39 they presents with 2 days of decreased responsiveness. Patient's daughter called EMS because she has not moved from the bed and today's and was covered in feces. When EMS arrived patient was unresponsive to painful stimulation and oxygen was placed. Patient's CBG was 200 and oh. Patient's heart rate is 140. We unfortunately do not have any past medical history and EMS reports that daughter had no knowledge of her mother's medical history. On file the patient had a NPA in and sating 100%. Patient would try me with her eyes however would not respond verbally. Patient localized pain. Patient has no obvious source on initial exam. Patient is very cachectic in appearance. We will get labs, imaging, EKG. Patient's EKG shows sinus tachycardia. Patient's lactic acid is 3.6. Patient is dehydrated with a creatinine of 3 BUN of 75. We will give the patient IV fluid boluses and started on maintenance and a half. Patient will be admitted to medicine telemetry service for further evaluation and care. Patient has had improvement of alertness however still has not been verbal to this point. We'll continue to try to contact family.  The patient's daughter who states that there may have been seizure activity prior to calling EMS. Daughter describes a generalized shaking that stopped prior to EMSs arrival. Patient has no known seizure history. Patient's daughter states that she does have a history of hypertension and pancreatic cancer otherwise she does not note any additional medical history. Patient is to be admitted to medicine however this, we'll consult neurology. Neurologist to evaluate the patient on the floor.  Beverely Risen, MD 01/27/14 [phone]

## 2014-01-27 NOTE — ED Notes (Signed)
i-stat CG4  Lactic acid result given to Dr. Jodi MourningZavitz

## 2014-01-27 NOTE — ED Notes (Signed)
PT CBG 168. RN notified.

## 2014-01-27 NOTE — ED Notes (Signed)
Attempted report 

## 2014-01-28 ENCOUNTER — Inpatient Hospital Stay (HOSPITAL_COMMUNITY): Payer: Medicaid Other

## 2014-01-28 DIAGNOSIS — D509 Iron deficiency anemia, unspecified: Secondary | ICD-10-CM

## 2014-01-28 DIAGNOSIS — E872 Acidosis: Secondary | ICD-10-CM

## 2014-01-28 DIAGNOSIS — I319 Disease of pericardium, unspecified: Secondary | ICD-10-CM

## 2014-01-28 DIAGNOSIS — R4182 Altered mental status, unspecified: Secondary | ICD-10-CM

## 2014-01-28 LAB — HEMOGLOBIN A1C
HEMOGLOBIN A1C: 5.8 % — AB (ref <5.7)
HEMOGLOBIN A1C: 5.7 % — AB (ref <5.7)
MEAN PLASMA GLUCOSE: 117 mg/dL — AB (ref <117)
Mean Plasma Glucose: 120 mg/dL — ABNORMAL HIGH (ref <117)

## 2014-01-28 LAB — COMPREHENSIVE METABOLIC PANEL
ALBUMIN: 2.7 g/dL — AB (ref 3.5–5.2)
ALT: 6 U/L (ref 0–35)
ANION GAP: 19 — AB (ref 5–15)
AST: 14 U/L (ref 0–37)
Alkaline Phosphatase: 77 U/L (ref 39–117)
BILIRUBIN TOTAL: 0.3 mg/dL (ref 0.3–1.2)
BUN: 76 mg/dL — AB (ref 6–23)
CHLORIDE: 107 meq/L (ref 96–112)
CO2: 15 meq/L — AB (ref 19–32)
CREATININE: 2.59 mg/dL — AB (ref 0.50–1.10)
Calcium: 9.3 mg/dL (ref 8.4–10.5)
GFR, EST AFRICAN AMERICAN: 25 mL/min — AB (ref >90)
GFR, EST NON AFRICAN AMERICAN: 21 mL/min — AB (ref >90)
GLUCOSE: 102 mg/dL — AB (ref 70–99)
Potassium: 4.7 mEq/L (ref 3.7–5.3)
Sodium: 141 mEq/L (ref 137–147)
Total Protein: 7.9 g/dL (ref 6.0–8.3)

## 2014-01-28 LAB — TROPONIN I
TROPONIN I: 0.53 ng/mL — AB (ref <0.30)
TROPONIN I: 0.52 ng/mL — AB (ref <0.30)
Troponin I: 0.55 ng/mL (ref <0.30)
Troponin I: 0.56 ng/mL (ref <0.30)

## 2014-01-28 LAB — LIPID PANEL
CHOL/HDL RATIO: 3.4 ratio
CHOLESTEROL: 182 mg/dL (ref 0–200)
HDL: 54 mg/dL (ref >39)
LDL Cholesterol: 104 mg/dL — ABNORMAL HIGH (ref 0–99)
TRIGLYCERIDES: 119 mg/dL (ref <150)
VLDL: 24 mg/dL (ref 0–40)

## 2014-01-28 LAB — URINE CULTURE
Colony Count: NO GROWTH
Culture: NO GROWTH

## 2014-01-28 LAB — GLUCOSE, CAPILLARY
GLUCOSE-CAPILLARY: 103 mg/dL — AB (ref 70–99)
Glucose-Capillary: 94 mg/dL (ref 70–99)
Glucose-Capillary: 84 mg/dL (ref 70–99)
Glucose-Capillary: 83 mg/dL (ref 70–99)

## 2014-01-28 LAB — CBC
HCT: 27.8 % — ABNORMAL LOW (ref 36.0–46.0)
Hemoglobin: 9.2 g/dL — ABNORMAL LOW (ref 12.0–15.0)
MCH: 25.4 pg — ABNORMAL LOW (ref 26.0–34.0)
MCHC: 33.1 g/dL (ref 30.0–36.0)
MCV: 76.8 fL — ABNORMAL LOW (ref 78.0–100.0)
PLATELETS: 295 10*3/uL (ref 150–400)
RBC: 3.62 MIL/uL — ABNORMAL LOW (ref 3.87–5.11)
RDW: 16.6 % — ABNORMAL HIGH (ref 11.5–15.5)
WBC: 6.7 10*3/uL (ref 4.0–10.5)

## 2014-01-28 MED ORDER — ENOXAPARIN SODIUM 30 MG/0.3ML ~~LOC~~ SOLN
30.0000 mg | SUBCUTANEOUS | Status: DC
  Administered 2014-01-28: 30 mg via SUBCUTANEOUS
  Filled 2014-01-28 (×2): qty 0.3

## 2014-01-28 MED ORDER — LEVETIRACETAM IN NACL 500 MG/100ML IV SOLN
500.0000 mg | Freq: Two times a day (BID) | INTRAVENOUS | Status: DC
  Administered 2014-01-28 – 2014-02-17 (×40): 500 mg via INTRAVENOUS
  Filled 2014-01-28 (×47): qty 100

## 2014-01-28 MED ORDER — LEVETIRACETAM IN NACL 1000 MG/100ML IV SOLN
1000.0000 mg | INTRAVENOUS | Status: AC
  Administered 2014-01-28: 1000 mg via INTRAVENOUS
  Filled 2014-01-28: qty 100

## 2014-01-28 MED ORDER — METOPROLOL TARTRATE 1 MG/ML IV SOLN
2.5000 mg | Freq: Four times a day (QID) | INTRAVENOUS | Status: DC
  Administered 2014-01-28 – 2014-01-29 (×4): 2.5 mg via INTRAVENOUS
  Filled 2014-01-28 (×9): qty 5

## 2014-01-28 NOTE — Evaluation (Addendum)
Physical Therapy Evaluation Patient Details Name: Desiree BeamsKristy Hale MRN: [id number] DOB: 1970/04/11 Today's Date: 01/28/2014   History of Present Illness  43 year old female with past medical history of hypertension, pancreatic cancer (per ED resident note, but this is not noted in H&P), marijuana use who [facility] after she was found unresponsive at home by a family member. Patient was found shaking, lying on the floor, unable to speak.  Clinical Impression  *Pt admitted with *unresponsiveness*. Pt currently with functional limitations due to the deficits listed below (see PT Problem List).  Pt will benefit from skilled PT to increase their independence and safety with mobility to allow discharge to the venue listed below.   Noted CT imaging of head negative, MRI of brain performed today results are pending. Per chart pt was "normal" until 01/27/14 when she was found unresponsive on floor by family. Pt opens eyes but otherwise does not respond or communicate in any way. Her PROM is WNL for BUE/LEs, no active movement noted. She is a total assist for mobility. SNF recommended.   No contact information for family available in her chart. Her baseline of function is unknown.  **    Follow Up Recommendations SNF    Equipment Recommendations  Other (comment) (to be determined)    Recommendations for Other Services OT consult     Precautions / Restrictions Precautions Precautions: Fall Precaution Comments: non-communicative Restrictions Weight Bearing Restrictions: No      Mobility  Bed Mobility Overal bed mobility: +2 for physical assistance;Needs Assistance Bed Mobility: Supine to Sit     Supine to sit: Total assist     General bed mobility comments: pt 0%  Transfers                    Ambulation/Gait                Stairs            Wheelchair Mobility    Modified Rankin (Stroke Patients Only)       Balance Overall balance  assessment: Needs assistance   Sitting balance-Leahy Scale: Zero                                       Pertinent Vitals/Pain Pain Assessment: Faces Faces Pain Scale: No hurt    Home Living Family/patient expects to be discharged to:: Skilled nursing facility                      Prior Function Level of Independence: Independent               Hand Dominance   Dominant Hand:  (unknown)    Extremity/Trunk Assessment   Upper Extremity Assessment: Difficult to assess due to impaired cognition ([facility])           Lower Extremity Assessment: Difficult to assess due to impaired cognition ([facility])         Communication   Communication: Receptive difficulties;Expressive difficulties (pt did not communicate verbally or non-verbally, no response to questions/commands)  Cognition Arousal/Alertness: Lethargic Behavior During Therapy: Flat affect Overall Cognitive Status: No family/caregiver present to determine baseline cognitive functioning                      General Comments      Exercises  Assessment/Plan    PT Assessment Patient needs continued PT services  PT Diagnosis Altered mental status;Generalized weakness   PT Problem List Decreased activity tolerance;Decreased strength;Decreased mobility;Decreased balance;Decreased cognition  PT Treatment Interventions Functional mobility training;Therapeutic activities;Patient/family education;Therapeutic exercise;Balance training   PT Goals (Current goals can be found in the Care Plan section) Acute Rehab PT Goals PT Goal Formulation: Patient unable to participate in goal setting Time For Goal Achievement: 02/11/14 Potential to Achieve Goals: Fair    Frequency Min 3X/week   Barriers to discharge        Co-evaluation               End of Session   Activity Tolerance: Patient tolerated treatment well Patient left: in bed;with call bell/phone  within reach;with bed alarm set Nurse Communication: Mobility status;Need for lift equipment         Time: [id number] PT Time Calculation (min): 8 min   Charges:   PT Evaluation $Initial PT Evaluation Tier I: 1 Procedure     PT G Codes:          [name], [name] 01/28/2014, 10:30 AM [phone]

## 2014-01-28 NOTE — Progress Notes (Signed)
Routine EEG completed, results pending. 

## 2014-01-28 NOTE — Evaluation (Signed)
Speech Language Pathology Evaluation Patient Details Name: Desiree Hale MRN: [id number] DOB: 08-Nov-1970 Today's Date: 01/28/2014 Time: [id number] SLP Time Calculation (min): 10 min  Problem List:  Patient Active Problem List   Diagnosis Date Noted  . Altered mental state 01/27/2014  . HTN (hypertension) 01/27/2014  . Tachycardia 01/27/2014  . Metabolic acidosis 01/27/2014  . Hyperglycemia 01/27/2014  . Acute renal failure 01/27/2014  . Microcytic anemia 01/27/2014  . Hypercalcemia 01/27/2014   Past Medical History: History reviewed. No pertinent past medical history. Past Surgical History: History reviewed. No pertinent past surgical history. HPI:  [age] [facility] ? [facility].  Pt with PMH + for DM, marijuana use, HTN, pancreatic cancer per resident MD note.  Speech and swallow evaluation ordered.  CT head negative.  MRI results pending.  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     Assessment / Plan / Recommendation Clinical Impression  Pt with gross cognitive linguistic deficits with primitive phonation only.  Pt did not follow directions but turned and tracked SLP when her name was called.   She did not nod yes/no to basic environmental or needs questions.  Suspect cogntiive deficits present as well  - including decreased attention, problem solving.    No family present to establish baseline.  Per RN, pt was verbal prior to this medical event.  Will follow for basic cognitive linguistic goals to decrease caregiver burden and family education.      SLP Assessment  Patient needs continued Speech Lanaguage Pathology Services    Follow Up Recommendations  Skilled Nursing facility    Frequency and Duration min 2x/week  2 weeks   Pertinent Vitals/Pain Pain Assessment: Faces Faces Pain Scale: No hurt   SLP Goals  Patient/Family Stated Goal: none stated Potential to Achieve Goals: Fair Potential Considerations: Co-morbidities;Severity of impairments  SLP Evaluation Prior  Functioning  Cognitive/Linguistic Baseline: Information not available (no family present, per RN, pt stays with daughter when spouse goes to work at night)   Cognition  Overall Cognitive Status: No family/caregiver present to determine baseline cognitive functioning Arousal/Alertness: Awake/alert Orientation Level: Disoriented X4 (pt non verbal and not answering yes/no questions) Attention: Focused Focused Attention: Impaired Focused Attention Impairment: Functional basic Problem Solving: Impaired Problem Solving Impairment: Functional basic (grossly impaired)    Comprehension  Auditory Comprehension Overall Auditory Comprehension: Impaired Yes/No Questions: Impaired Basic Biographical Questions: 0-25% accurate Commands: Impaired One Step Basic Commands: 0-24% accurate Conversation: Simple Interfering Components: Attention EffectiveTechniques:  (multiple techniques attempted without success) Visual Recognition/Discrimination Discrimination: Not tested Reading Comprehension Reading Status: Not tested    Expression Expression Primary Mode of Expression: Other (comment) (per discussion with RN, pt was verbal prior to this medical event) Verbal Expression Overall Verbal Expression: Impaired Other Verbal Expression Comments: pt with basic primitive vocalizations only, no attempts to verbally communicate Written Expression Dominant Hand:  (unknown) Written Expression: Not tested   Oral / Motor Oral Motor/Sensory Function Overall Oral Motor/Sensory Function:  (pt did not follow directions, bit down on oral suction cathether, able to seal lips bilaterally) Motor Speech Overall Motor Speech: Other (comment) (pt nonverbal currently, primitive phonation only)   GO     Donavan Burnetamara [name], MS [facility] SLP [phone]

## 2014-01-28 NOTE — ED Provider Notes (Signed)
Medical screening examination/treatment/procedure(s) were conducted as a shared visit with non-physician practitioner(s) or resident and myself. I personally evaluated the patient during the encounter and agree with the findings.  I have personally reviewed any xrays and/ or EKG's with the provider and I agree with interpretation.  Patient presents with altered mental status/unresponsive worsening for the past 2 days. Unable to get details on history and even EMS because family did not get very many details except patient is been in the bed for 2 days with no nutrition and no specific reason why. Patient arrived with general lethargy, nonverbal, alert to voice and follows some commands. Very dry mucous membranes, significant sinus tachycardia, abdomen soft nontender, significant weakness bilateral, very mild flexion of both arms, sensation of pain intact, patient unable to left extremities on her own, pupils equal bilateral neck supple no meningismus, cachectic. Patient received fluid boluses in ER, bedside ultrasound for significant tachycardia performed no significant pericardial effusion, mild decreased ejection fraction, IVC collapsing consistent with significant dehydration. CT head pending,  plan for admission for acute renal therapy, altered mental status, dehydration, lethargy, lactic acidosis, general weakness.  EMERGENCY DEPARTMENT US CARDIAC EXAM  "Study: Limited Ultrasound of the heart and pericardium"  INDICATIONS:Tachycardia  Multiple views of the heart and pericardium were obtained in real-time with a multi-frequency probe.  PERFORMED ZO:XWRUEABY:Myself  IMAGES ARCHIVED?: Yes  FINDINGS: Small effusion, Decreased contractility and Tamponade physiology absent  LIMITATIONS: Body habitus cachectic  VIEWS USED: Subcostal 4 chamber, Parasternal long axis, Parasternal short axis, Apical 4 chamber and Inferior Vena Cava  INTERPRETATION: Cardiac activity present, Pericardial effusioin absent, Cardiac  tamponade absent, Probable low CVP and Decreased contractility  CRITICAL CARE Performed by: Enid SkeensZAVITZ, Nakia Koble M  Total critical care time: 35 min  Critical care time was exclusive of separately billable procedures and treating other patients.  Critical care was necessary to treat or prevent imminent or life-threatening deterioration.  Critical care was time spent personally by me on the following activities: development of treatment plan with patient and/or surrogate as well as nursing, discussions with consultants, evaluation of patient's response to treatment, examination of patient, obtaining history from patient or surrogate, ordering and performing treatments and interventions, ordering and review of laboratory studies, ordering and review of radiographic studies, pulse oximetry and re-evaluation of patient's condition.      Enid SkeensJoshua M Crystall Donaldson, MD 01/28/14 [phone]

## 2014-01-28 NOTE — Progress Notes (Signed)
RN paged elevated troponin of .58. Pt's mental status precludes her from complaining of CP. Admitted with ? Stroke vs seizure, acidosis, and ARF 01/27/14. EKG showed Aflutter at that time. Metoprolol x 1 dose given.  Neuro had seen pt and placed on daily ASA PR. No hx CAD. On O2. Given multiple issues on admission, elevated troponin was thought to be demand ischemia. 2nd Troponin down to .55. Pt no longer in Aflutter, but ST.  Continue to follow enzymes and continue stroke/sz workup.  KJKG, NP

## 2014-01-28 NOTE — Progress Notes (Signed)
UR complete.  Chelsia Serres RN, MSN 

## 2014-01-28 NOTE — Procedures (Signed)
ELECTROENCEPHALOGRAM REPORT   Patient: Desiree Hale       Room #: 1O104N29 EEG No. ID: [id number] Age: 43 y.o.        Sex: female Referring Physician: Rai Report Date:  01/28/2014        Interpreting Physician: Thana FarrEYNOLDS,Mahad Newstrom D  History: Desiree BeamsKristy Balaban is an 43 y.o. female with episodes of loss of consciousness and possible seizures  Medications:  Scheduled: . aspirin  300 mg Rectal Daily  . enoxaparin (LOVENOX) injection  30 mg Subcutaneous Q24H  . insulin aspart  0-9 Units Subcutaneous TID WC  . levETIRAcetam  500 mg Intravenous Q12H  . metoprolol  2.5 mg Intravenous 4 times per day  . sodium chloride  3 mL Intravenous Q12H    Conditions of Recording:  This is a 16 channel EEG carried out with the patient in the drowsy and asleep states.  Description:  The patient is lying in bed with her eyes closed throughout the entire recording.  The initial tracing resembles drowse with a background activity of low to moderate voltage that is poorly organized and consisting mostly of slower frequencies.  Only some occasional alpha is noted that is poorly organized and poorly sustained.    The patient goes in to a light sleep with symmetrical sleep spindles, vertex central sharp transients and irregular slow activity.    No epileptiform activity is noted.    Hyperventilation and intermittent photic stimulation were not performed.   IMPRESSION: This is a normal drowsy and asleep electroencephalogram.  No epileptiform activity is noted.     Thana FarrLeslie Koi Zangara, MD [facility] [phone] 01/28/2014, 4:16 PM

## 2014-01-28 NOTE — Evaluation (Signed)
Clinical/Bedside Swallow Evaluation Patient Details  Name: Beryle BeamsKristy Goldwasser MRN: [id number] Date of Birth: 07-31-1970  Today's Date: 01/28/2014 Time: [id number] SLP Time Calculation (min): 24 min  Past Medical History: History reviewed. No pertinent past medical history. Past Surgical History: History reviewed. No pertinent past surgical history. HPI:  43 [facility] ? [facility].  Pt with PMH + for DM, marijuana use, HTN, pancreatic cancer per resident MD note.  Speech and swallow evaluation ordered.     Assessment / Plan / Recommendation Clinical Impression  Pt currently presents with severe oral dysphagia and possible pharyngeal deficits.  Poor awareness to boluses evidenced by pt's lack of oral transiting, oral holding and right anterior labial spillage (pt leaning on right side).  She will readily open her mouth to accept moisture via toothette but does not transit and swallow.    With oral suctioning, she demonstrated tonic bite on Yankauer catheter - ? cognitive based.  SLP able to remove using toothette with oral moisture to allow reflexive oral opening.  RN made aware.    Recommend NPO with oral care and SlP to re-evaluate for po readiness.  No family present at this time.      Aspiration Risk  Severe    Diet Recommendation NPO   Medication Administration: Via alternative means    Other  Recommendations Oral Care Recommendations: Oral care Q4 per protocol   Follow Up Recommendations  Skilled Nursing facility    Frequency and Duration min 2x/week  2 weeks   Pertinent Vitals/Pain Low grade temperature, decreased      Swallow Study Prior Functional Status  Cognitive/Linguistic Baseline: Information not available (no family present, per RN, pt stays with daughter when spouse goes to work at night)    General Date of Onset: 01/28/14 H[facility]: 43 [facility].  Pt with PMH + for DM, marijuana use, HTN, pancreatic cancer per resident  MD note.  Speech and swallow evaluation ordered.   Type of Study: Bedside swallow evaluation Diet Prior to this Study: NPO Temperature Spikes Noted: No Respiratory Status: Nasal cannula History of Recent Intubation: No Behavior/Cognition: Distractible;Doesn't follow directions;Decreased sustained attention;Alert Oral Cavity - Dentition: Adequate natural dentition (missing some dentition, difficult to examine due to decreased direction following) Self-Feeding Abilities: Total assist Patient Positioning: Upright in bed Baseline Vocal Quality: Low vocal intensity (primitive phonation only) Volitional Cough: Cognitively unable to elicit Volitional Swallow: Unable to elicit    Oral/Motor/Sensory Function Overall Oral Motor/Sensory Function:  (pt did not follow directions, bit down on oral suction cathether, able to seal lips bilaterally)   Ice Chips Ice chips: Not tested   Thin Liquid Thin Liquid: Impaired Presentation: Spoon (via toothette) Oral Phase Impairments: Reduced labial seal;Reduced lingual movement/coordination;Impaired anterior to posterior transit;Poor awareness of bolus Oral Phase Functional Implications: Right anterior spillage;Oral residue;Oral holding (pt leaning on right side) Other Comments: slp suctioned water from pt's oral cavity    Nectar Thick Nectar Thick Liquid: Not tested   Honey Thick Honey Thick Liquid: Not tested   Puree Puree: Not tested   Solid   GO    Solid: Not tested       Donavan Burnetamara Dalayna Lauter, MS [facility] SLP [phone]

## 2014-01-28 NOTE — Progress Notes (Signed)
Patient ID: Desiree Hale  female  RUE:[id number]    DOB: 19-Nov-1970    DOA: 01/27/2014  PCP: No primary provider on file.   Brief history of present illness  Patient is a 43 year old female with past medical history of hypertension, marijuana use who presented to [facility] ED 01/27/2014 after she was found unresponsive at home by a family member today. Patient was found shaking, lying on the floor, unable to speak. Patient's son at the bedside says he spoke with her over the phone at 4 PM one day prior to the admission and patient seemed to be fine. Neurology consulted. Patient was admitted for acute encephalopathy and dehydration.    Assessment/Plan: Principal Problem:   Altered mental state/acute encephalopathy - Neurology consulted initial concern for acute CVA however MRI negative for acute stroke, recommended followup MRI without and with contrast in 2-3 weeks - PTOT evaluation when possible to participate continue IV fluids for now - No UTI on UA, chest x-ray showed no acute disease  Active Problems:   HTN (hypertension) - Placed on IV Lopressor, aspirin PR    Metabolic acidosis/acute renal insufficiency -Improving, likely due to lactic acidosis from seizures, continue IV fluid hydration     Microcytic anemia - Follow closely, hemoglobin currently stable    Hypercalcemia -Improved likely due to dehydration   elevated troponins: Likely due to demand ischemia from seizures - Recheck serial troponins, obtain 2-D echocardiogram - EKG showed sinus tachycardia with repolarization abnormalities  - Will place on IV Lopressor and aspirin, add statins once taking oral  DVT Prophylaxis:  Code Status:  Family Communication:  Disposition:  Consultants:  Neurology   Procedures: MRI of the brain : Diffuse cortical and subcortical signal abnormality bilaterally  within the temporal and posterior frontal lobes as described. This  is most likely a postictal phenomenon. There is no  restricted  diffusion to suggest acute or subacute ischemia.     Antibiotics:  None    Subjective: Patient seen and examined, confused not following any verbal commands   Objective: Weight change:  No intake or output data in the 24 hours ending 01/28/14 1310 Blood pressure 178/97, pulse 100, temperature 98.1 F (36.7 C), temperature source Oral, resp. rate 18, height 5\' 7"  (1.702 m), weight 42.7 kg (94 lb 2.2 oz), SpO2 100.00%.  Physical Exam: General: Alert and awake, confused, not following commands  CVS: S1-S2 clear, no murmur rubs or gallops Chest: clear to auscultation bilaterally, no wheezing, rales or rhonchi Abdomen: soft nontender, nondistended, normal bowel sounds  Extremities: no cyanosis, clubbing or edema noted bilaterally Neuro:  not following commands   Lab Results: Basic Metabolic Panel:  Recent Labs Lab 01/27/14 1512 01/28/14 0137  NA 137 141  K 5.0 4.7  CL 94* 107  CO2 16* 15*  GLUCOSE 147* 102*  BUN 75* 76*  CREATININE 3.07* 2.59*  CALCIUM 10.6* 9.3   Liver Function Tests:  Recent Labs Lab 01/27/14 1512 01/28/14 0137  AST 15 14  ALT 8 6  ALKPHOS 96 77  BILITOT 0.4 0.3  PROT 9.8* 7.9  ALBUMIN 3.4* 2.7*   No results found for this basename: LIPASE, AMYLASE,  in the last 168 hours  Recent Labs Lab 01/27/14 1514  AMMONIA 21   CBC:  Recent Labs Lab 01/27/14 1512 01/28/14 0137  WBC 5.6 6.7  NEUTROABS 4.5  --   HGB 10.9* 9.2*  HCT 32.8* 27.8*  MCV 74.9* 76.8*  PLT 383 295   Cardiac Enzymes:  Recent  Labs Lab 01/27/14 1524 01/27/14 1956 01/28/14 0137 01/28/14 0916  CKTOTAL 58  --   --   --   TROPONINI  --  0.58* 0.55* 0.56*   BNP: No components found with this basename: POCBNP,  CBG:  Recent Labs Lab 01/27/14 1512 01/27/14 2211 01/28/14 0701 01/28/14 1237  GLUCAP 168* 108* 94 103*     Micro Results: No results found for this or any previous visit (from the past 240 hour(s)).  Studies/Results: Ct Head  Wo Contrast  01/27/2014   CLINICAL DATA:  Found unresponsive.  Altered mental status.  EXAM: CT HEAD WITHOUT CONTRAST  CT CERVICAL SPINE WITHOUT CONTRAST  TECHNIQUE: Multidetector CT imaging of the head and cervical spine was performed following the standard protocol without intravenous contrast. Multiplanar CT image reconstructions of the cervical spine were also generated.  COMPARISON:  None.  FINDINGS: CT HEAD FINDINGS  The ventricles are normal in size and configuration. No extra-axial fluid collections are identified. The gray-white differentiation is normal. No CT findings for acute intracranial process such as hemorrhage or infarction. No mass lesions. The brainstem and cerebellum are grossly normal.  The bony structures are intact. The paranasal sinuses and mastoid air cells are clear. The globes are intact.  CT CERVICAL SPINE FINDINGS  Normal alignment of the cervical vertebral bodies. Disc spaces and vertebral bodies are maintained. No acute fracture or abnormal prevertebral soft tissue swelling. The facets are normally aligned. The skullbase C1 and C1-2 articulations are maintained. The dens is normal. No large disc protrusions, spinal or foraminal stenosis. The lung apices are clear. Emphysematous changes are noted. There is some air in the supraclavicular fossa possibly due to a laceration. No pneumothorax is identified.  IMPRESSION: No acute intracranial findings or skull fracture.  Normal CT examination of the cervical spine. No cervical spine fracture.   Electronically Signed   By: Loralie ChampagneMark  Gallerani M.D.   On: 01/27/2014 17:27   Ct Cervical Spine Wo Contrast  01/27/2014   CLINICAL DATA:  Found unresponsive.  Altered mental status.  EXAM: CT HEAD WITHOUT CONTRAST  CT CERVICAL SPINE WITHOUT CONTRAST  TECHNIQUE: Multidetector CT imaging of the head and cervical spine was performed following the standard protocol without intravenous contrast. Multiplanar CT image reconstructions of the cervical spine  were also generated.  COMPARISON:  None.  FINDINGS: CT HEAD FINDINGS  The ventricles are normal in size and configuration. No extra-axial fluid collections are identified. The gray-white differentiation is normal. No CT findings for acute intracranial process such as hemorrhage or infarction. No mass lesions. The brainstem and cerebellum are grossly normal.  The bony structures are intact. The paranasal sinuses and mastoid air cells are clear. The globes are intact.  CT CERVICAL SPINE FINDINGS  Normal alignment of the cervical vertebral bodies. Disc spaces and vertebral bodies are maintained. No acute fracture or abnormal prevertebral soft tissue swelling. The facets are normally aligned. The skullbase C1 and C1-2 articulations are maintained. The dens is normal. No large disc protrusions, spinal or foraminal stenosis. The lung apices are clear. Emphysematous changes are noted. There is some air in the supraclavicular fossa possibly due to a laceration. No pneumothorax is identified.  IMPRESSION: No acute intracranial findings or skull fracture.  Normal CT examination of the cervical spine. No cervical spine fracture.   Electronically Signed   By: Loralie ChampagneMark  Gallerani M.D.   On: 01/27/2014 17:27   Mr Brain Wo Contrast  01/28/2014   CLINICAL DATA:  Unresponsive. The patient had an episode  with loss of consciousness. She has been aphasic since that episode despite regaining who consciousness otherwise.  EXAM: MRI HEAD WITHOUT CONTRAST  TECHNIQUE: Multiplanar, multiecho pulse sequences of the brain and surrounding structures were obtained without intravenous contrast.  COMPARISON:  CT head without contrast 01/27/2014.  FINDINGS: Abnormal cortical and subcortical T2 hyperintensity is evident in the posterior insular cortex and along the sylvian fissure bilaterally. There is abnormal signal extending into the operculum bilaterally, left greater than right. This signal abnormality extends nearly to the vertex within the  precentral sulcus. There is no significant associated restricted diffusion. No hemorrhage or mass lesion is present. There is no significant mass effect.  Flow is present in the major intracranial arteries. The globes and orbits are intact. Mild mucosal thickening is noted in the ethmoid air cells and sphenoid sinuses bilaterally.  The coronal T2 weighted images are somewhat degraded by patient motion. No definite hippocampal abnormality is evident.  IMPRESSION: 1. Diffuse cortical and subcortical signal abnormality bilaterally within the temporal and posterior frontal lobes as described. This is most likely a postictal phenomenon. There is no restricted diffusion to suggest acute or subacute ischemia. No mass lesion is evident. This could be related to elevated blood pressure similar to posterior reversible encephalopathy syndrome. The distribution is atypical. Recommend follow-up MRI of the brain without and with contrast in 2-3 weeks as the symptoms resolve. 2. Minimal sinus disease.   Electronically Signed   By: Gennette Pachris  Mattern M.D.   On: 01/28/2014 10:27   Dg Chest Portable 1 View  01/27/2014   CLINICAL DATA:  Altered mental status  EXAM: PORTABLE CHEST - 1 VIEW  COMPARISON:  None.  FINDINGS: The patient is rotated. There is no focal parenchymal opacity, pleural effusion, or pneumothorax. The heart and mediastinal contours are unremarkable.  The osseous structures are unremarkable.  IMPRESSION: No active disease.   Electronically Signed   By: Elige KoHetal  Patel   On: 01/27/2014 16:17    Medications: Scheduled Meds: . aspirin  300 mg Rectal Daily  . enoxaparin (LOVENOX) injection  30 mg Subcutaneous Q24H  . insulin aspart  0-9 Units Subcutaneous TID WC  . levETIRAcetam  500 mg Intravenous Q12H  . sodium chloride  3 mL Intravenous Q12H      LOS: 1 day   Devante Capano M.D. [facility] 01/28/2014, 1:10 PM Pager: [phone]  If 7PM-7AM, please contact night-coverage [email or web address] Password  [email or web address]

## 2014-01-28 NOTE — Progress Notes (Signed)
  Echocardiogram 2D Echocardiogram has been performed.  Arvil ChacoFoster, Elba Dendinger 01/28/2014, 11:08 AM

## 2014-01-28 NOTE — Progress Notes (Signed)
Subjective: Patient remains nonverbal.  She is moaning but follows no commands.   Objective: Current vital signs: BP 178/97  Pulse 100  Temp(Src) 98.1 F (36.7 C) (Oral)  Resp 18  Ht 5\' 7"  (1.702 m)  Wt 42.7 kg (94 lb 2.2 oz)  BMI 14.74 kg/m2  SpO2 100% Vital signs in last 24 hours: Temp:  [97.8 F (36.6 C)-99.1 F (37.3 C)] 98.1 F (36.7 C) (10/22 1012) Pulse Rate:  [99-140] 100 (10/22 1012) Resp:  [14-26] 18 (10/22 1012) BP: (160-198)/(88-126) 178/97 mmHg (10/22 1012) SpO2:  [97 %-100 %] 100 % (10/22 1012) Weight:  [40.2 kg (88 lb 10 oz)-42.7 kg (94 lb 2.2 oz)] 42.7 kg (94 lb 2.2 oz) (10/22 0500)  Intake/Output from previous day:   Intake/Output this shift:   Nutritional status: NPO  Neurologic Exam: Patient was alert and moaning in bed. She had no verbal output and not following commands. She appear to be moderately agitated.  Pupils were equal and reacted normally to light.  Extraocular movements were full on right left lateral gaze and tracks my motions in the room Blinks to threat bilaterally.  She appear to have moderate right lower facial weakness.  She was diffusely tremulous with slight increase in muscle tone throughout. She also appeared to have mild asterixis bilaterally. Resistance to passive movement was symmetrical. Withdrawal movements to noxious stimuli were equal.  Responses to sensory testing unreliable.  Deep tendon reflexes were 1+ and symmetrical.  Plantar responses were flexor bilaterally.   Lab Results: Basic Metabolic Panel:  Recent Labs Lab 01/27/14 1512 01/28/14 0137  NA 137 141  K 5.0 4.7  CL 94* 107  CO2 16* 15*  GLUCOSE 147* 102*  BUN 75* 76*  CREATININE 3.07* 2.59*  CALCIUM 10.6* 9.3    Liver Function Tests:  Recent Labs Lab 01/27/14 1512 01/28/14 0137  AST 15 14  ALT 8 6  ALKPHOS 96 77  BILITOT 0.4 0.3  PROT 9.8* 7.9  ALBUMIN 3.4* 2.7*   No results found for this basename: LIPASE, AMYLASE,  in the last 168  hours  Recent Labs Lab 01/27/14 1514  AMMONIA 21    CBC:  Recent Labs Lab 01/27/14 1512 01/28/14 0137  WBC 5.6 6.7  NEUTROABS 4.5  --   HGB 10.9* 9.2*  HCT 32.8* 27.8*  MCV 74.9* 76.8*  PLT 383 295    Cardiac Enzymes:  Recent Labs Lab 01/27/14 1524 01/27/14 1956 01/28/14 0137 01/28/14 0916  CKTOTAL 58  --   --   --   TROPONINI  --  0.58* 0.55* 0.56*    Lipid Panel:  Recent Labs Lab 01/28/14 0137  CHOL 182  TRIG 119  HDL 54  CHOLHDL 3.4  VLDL 24  LDLCALC 161104*    CBG:  Recent Labs Lab 01/27/14 1512 01/27/14 2211 01/28/14 0701 01/28/14 1237  GLUCAP 168* 108* 94 103*    Microbiology: No results found for this or any previous visit.  Coagulation Studies:  Recent Labs  01/27/14 1512  LABPROT 13.9  INR 1.06    Imaging: Ct Head Wo Contrast  01/27/2014   CLINICAL DATA:  Found unresponsive.  Altered mental status.  EXAM: CT HEAD WITHOUT CONTRAST  CT CERVICAL SPINE WITHOUT CONTRAST  TECHNIQUE: Multidetector CT imaging of the head and cervical spine was performed following the standard protocol without intravenous contrast. Multiplanar CT image reconstructions of the cervical spine were also generated.  COMPARISON:  None.  FINDINGS: CT HEAD FINDINGS  The ventricles are normal  in size and configuration. No extra-axial fluid collections are identified. The gray-white differentiation is normal. No CT findings for acute intracranial process such as hemorrhage or infarction. No mass lesions. The brainstem and cerebellum are grossly normal.  The bony structures are intact. The paranasal sinuses and mastoid air cells are clear. The globes are intact.  CT CERVICAL SPINE FINDINGS  Normal alignment of the cervical vertebral bodies. Disc spaces and vertebral bodies are maintained. No acute fracture or abnormal prevertebral soft tissue swelling. The facets are normally aligned. The skullbase C1 and C1-2 articulations are maintained. The dens is normal. No large disc  protrusions, spinal or foraminal stenosis. The lung apices are clear. Emphysematous changes are noted. There is some air in the supraclavicular fossa possibly due to a laceration. No pneumothorax is identified.  IMPRESSION: No acute intracranial findings or skull fracture.  Normal CT examination of the cervical spine. No cervical spine fracture.   Electronically Signed   By: Loralie Champagne M.D.   On: 01/27/2014 17:27   Ct Cervical Spine Wo Contrast  01/27/2014   CLINICAL DATA:  Found unresponsive.  Altered mental status.  EXAM: CT HEAD WITHOUT CONTRAST  CT CERVICAL SPINE WITHOUT CONTRAST  TECHNIQUE: Multidetector CT imaging of the head and cervical spine was performed following the standard protocol without intravenous contrast. Multiplanar CT image reconstructions of the cervical spine were also generated.  COMPARISON:  None.  FINDINGS: CT HEAD FINDINGS  The ventricles are normal in size and configuration. No extra-axial fluid collections are identified. The gray-white differentiation is normal. No CT findings for acute intracranial process such as hemorrhage or infarction. No mass lesions. The brainstem and cerebellum are grossly normal.  The bony structures are intact. The paranasal sinuses and mastoid air cells are clear. The globes are intact.  CT CERVICAL SPINE FINDINGS  Normal alignment of the cervical vertebral bodies. Disc spaces and vertebral bodies are maintained. No acute fracture or abnormal prevertebral soft tissue swelling. The facets are normally aligned. The skullbase C1 and C1-2 articulations are maintained. The dens is normal. No large disc protrusions, spinal or foraminal stenosis. The lung apices are clear. Emphysematous changes are noted. There is some air in the supraclavicular fossa possibly due to a laceration. No pneumothorax is identified.  IMPRESSION: No acute intracranial findings or skull fracture.  Normal CT examination of the cervical spine. No cervical spine fracture.    Electronically Signed   By: Loralie Champagne M.D.   On: 01/27/2014 17:27   Mr Brain Wo Contrast  01/28/2014   CLINICAL DATA:  Unresponsive. The patient had an episode with loss of consciousness. She has been aphasic since that episode despite regaining who consciousness otherwise.  EXAM: MRI HEAD WITHOUT CONTRAST  TECHNIQUE: Multiplanar, multiecho pulse sequences of the brain and surrounding structures were obtained without intravenous contrast.  COMPARISON:  CT head without contrast 01/27/2014.  FINDINGS: Abnormal cortical and subcortical T2 hyperintensity is evident in the posterior insular cortex and along the sylvian fissure bilaterally. There is abnormal signal extending into the operculum bilaterally, left greater than right. This signal abnormality extends nearly to the vertex within the precentral sulcus. There is no significant associated restricted diffusion. No hemorrhage or mass lesion is present. There is no significant mass effect.  Flow is present in the major intracranial arteries. The globes and orbits are intact. Mild mucosal thickening is noted in the ethmoid air cells and sphenoid sinuses bilaterally.  The coronal T2 weighted images are somewhat degraded by patient motion. No definite  hippocampal abnormality is evident.  IMPRESSION: 1. Diffuse cortical and subcortical signal abnormality bilaterally within the temporal and posterior frontal lobes as described. This is most likely a postictal phenomenon. There is no restricted diffusion to suggest acute or subacute ischemia. No mass lesion is evident. This could be related to elevated blood pressure similar to posterior reversible encephalopathy syndrome. The distribution is atypical. Recommend follow-up MRI of the brain without and with contrast in 2-3 weeks as the symptoms resolve. 2. Minimal sinus disease.   Electronically Signed   By: Gennette Pachris  Mattern M.D.   On: 01/28/2014 10:27   Dg Chest Portable 1 View  01/27/2014   CLINICAL DATA:   Altered mental status  EXAM: PORTABLE CHEST - 1 VIEW  COMPARISON:  None.  FINDINGS: The patient is rotated. There is no focal parenchymal opacity, pleural effusion, or pneumothorax. The heart and mediastinal contours are unremarkable.  The osseous structures are unremarkable.  IMPRESSION: No active disease.   Electronically Signed   By: Elige KoHetal  Patel   On: 01/27/2014 16:17    Medications:  Scheduled: . aspirin  300 mg Rectal Daily  . enoxaparin (LOVENOX) injection  30 mg Subcutaneous Q24H  . insulin aspart  0-9 Units Subcutaneous TID WC  . levETIRAcetam  1,000 mg Intravenous STAT  . levETIRAcetam  500 mg Intravenous Q12H  . sodium chloride  3 mL Intravenous Q12H    Assessment/Plan: 43 yO female presenting with loss of consciousness followed by encephalopathy.  Patient remains non verbal on exam. MRI brain demonstrates diffuse cortical and subcortical signal abnormality bilaterally within the temporal and posterior frontal lobe most likely a postictal phenomenon. EEG has been obtained but pending formal reading.  Due to MRI findings Keppra has been started with initial 1 gram bolus followed bu renal dose of 500 mg BID.   Recommend: 1) Keppra at current dose.  2) EEG and will adjust AED according to EEG findings.  3) MRI shows no acute stroke thus Echo and carotids have been canceled.       Felicie MornDavid Kaylub Detienne PA-C [facility] [phone]  01/28/2014, 12:50 PM

## 2014-01-29 ENCOUNTER — Inpatient Hospital Stay (HOSPITAL_COMMUNITY): Payer: Medicaid Other

## 2014-01-29 ENCOUNTER — Encounter (HOSPITAL_COMMUNITY): Payer: Self-pay | Admitting: *Deleted

## 2014-01-29 DIAGNOSIS — J9600 Acute respiratory failure, unspecified whether with hypoxia or hypercapnia: Secondary | ICD-10-CM | POA: Insufficient documentation

## 2014-01-29 DIAGNOSIS — B370 Candidal stomatitis: Secondary | ICD-10-CM

## 2014-01-29 DIAGNOSIS — J9601 Acute respiratory failure with hypoxia: Secondary | ICD-10-CM

## 2014-01-29 DIAGNOSIS — R197 Diarrhea, unspecified: Secondary | ICD-10-CM

## 2014-01-29 DIAGNOSIS — B59 Pneumocystosis: Secondary | ICD-10-CM

## 2014-01-29 DIAGNOSIS — J690 Pneumonitis due to inhalation of food and vomit: Secondary | ICD-10-CM | POA: Insufficient documentation

## 2014-01-29 DIAGNOSIS — E43 Unspecified severe protein-calorie malnutrition: Secondary | ICD-10-CM

## 2014-01-29 DIAGNOSIS — R569 Unspecified convulsions: Secondary | ICD-10-CM

## 2014-01-29 DIAGNOSIS — N189 Chronic kidney disease, unspecified: Secondary | ICD-10-CM

## 2014-01-29 LAB — BASIC METABOLIC PANEL
Anion gap: 20 — ABNORMAL HIGH (ref 5–15)
BUN: 87 mg/dL — ABNORMAL HIGH (ref 6–23)
CALCIUM: 9.8 mg/dL (ref 8.4–10.5)
CHLORIDE: 113 meq/L — AB (ref 96–112)
CO2: 13 meq/L — AB (ref 19–32)
CREATININE: 2.67 mg/dL — AB (ref 0.50–1.10)
GFR calc non Af Amer: 21 mL/min — ABNORMAL LOW (ref >90)
GFR, EST AFRICAN AMERICAN: 24 mL/min — AB (ref >90)
Glucose, Bld: 83 mg/dL (ref 70–99)
Potassium: 4.2 mEq/L (ref 3.7–5.3)
SODIUM: 146 meq/L (ref 137–147)

## 2014-01-29 LAB — RPR

## 2014-01-29 LAB — HEPATIC FUNCTION PANEL
ALBUMIN: 2.8 g/dL — AB (ref 3.5–5.2)
ALT: 6 U/L (ref 0–35)
AST: 16 U/L (ref 0–37)
Alkaline Phosphatase: 69 U/L (ref 39–117)
BILIRUBIN TOTAL: 0.4 mg/dL (ref 0.3–1.2)
Bilirubin, Direct: <0.2 mg/dL (ref 0.0–0.3)
Total Protein: 7.5 g/dL (ref 6.0–8.3)

## 2014-01-29 LAB — CBC
HEMATOCRIT: 25.7 % — AB (ref 36.0–46.0)
HEMOGLOBIN: 8.3 g/dL — AB (ref 12.0–15.0)
MCH: 24.7 pg — ABNORMAL LOW (ref 26.0–34.0)
MCHC: 32.3 g/dL (ref 30.0–36.0)
MCV: 76.5 fL — ABNORMAL LOW (ref 78.0–100.0)
Platelets: 283 10*3/uL (ref 150–400)
RBC: 3.36 MIL/uL — ABNORMAL LOW (ref 3.87–5.11)
RDW: 17 % — AB (ref 11.5–15.5)
WBC: 10.2 10*3/uL (ref 4.0–10.5)

## 2014-01-29 LAB — GLUCOSE, CAPILLARY
GLUCOSE-CAPILLARY: 79 mg/dL (ref 70–99)
GLUCOSE-CAPILLARY: 108 mg/dL — AB (ref 70–99)
GLUCOSE-CAPILLARY: 142 mg/dL — AB (ref 70–99)
Glucose-Capillary: 83 mg/dL (ref 70–99)
Glucose-Capillary: 146 mg/dL — ABNORMAL HIGH (ref 70–99)

## 2014-01-29 LAB — BLOOD GAS, ARTERIAL
Acid-base deficit: 11.4 mmol/L — ABNORMAL HIGH (ref 0.0–2.0)
BICARBONATE: 12.9 meq/L — AB (ref 20.0–24.0)
Drawn by: 31101
O2 Content: 2 L/min
O2 Saturation: 92.7 %
PATIENT TEMPERATURE: 98.6
PCO2 ART: 22.9 mmHg — AB (ref 35.0–45.0)
PH ART: 7.369 (ref 7.350–7.450)
TCO2: 13.6 mmol/L (ref 0–100)
pO2, Arterial: 71.8 mmHg — ABNORMAL LOW (ref 80.0–100.0)

## 2014-01-29 LAB — CRYPTOCOCCAL ANTIGEN: Crypto Ag: NEGATIVE

## 2014-01-29 LAB — CK: Total CK: 142 U/L (ref 7–177)

## 2014-01-29 LAB — TROPONIN I: TROPONIN I: 0.49 ng/mL — AB (ref <0.30)

## 2014-01-29 LAB — PROCALCITONIN: PROCALCITONIN: 0.54 ng/mL

## 2014-01-29 LAB — T-HELPER CELLS (CD4) COUNT (NOT AT ARMC)
CD4 % Helper T Cell: 4 % — ABNORMAL LOW (ref 33–55)
CD4 T CELL ABS: 10 /uL — AB (ref 400–2700)

## 2014-01-29 LAB — CLOSTRIDIUM DIFFICILE BY PCR: Toxigenic C. Difficile by PCR: NEGATIVE

## 2014-01-29 LAB — STREP PNEUMONIAE URINARY ANTIGEN: STREP PNEUMO URINARY ANTIGEN: NEGATIVE

## 2014-01-29 LAB — INFLUENZA PANEL BY PCR (TYPE A & B)
H1N1 flu by pcr: NOT DETECTED
INFLAPCR: NEGATIVE
Influenza B By PCR: NEGATIVE

## 2014-01-29 LAB — AMMONIA: AMMONIA: 22 umol/L (ref 11–60)

## 2014-01-29 LAB — MRSA PCR SCREENING: MRSA by PCR: POSITIVE — AB

## 2014-01-29 LAB — LACTIC ACID, PLASMA: LACTIC ACID, VENOUS: 1.1 mmol/L (ref 0.5–2.2)

## 2014-01-29 MED ORDER — CETYLPYRIDINIUM CHLORIDE 0.05 % MT LIQD
7.0000 mL | Freq: Two times a day (BID) | OROMUCOSAL | Status: DC
  Administered 2014-01-29 – 2014-02-04 (×14): 7 mL via OROMUCOSAL

## 2014-01-29 MED ORDER — CHLORHEXIDINE GLUCONATE 0.12 % MT SOLN
15.0000 mL | Freq: Two times a day (BID) | OROMUCOSAL | Status: DC
  Administered 2014-01-29 – 2014-02-06 (×17): 15 mL via OROMUCOSAL
  Filled 2014-01-29 (×18): qty 15

## 2014-01-29 MED ORDER — DEXTROSE 5 % IV SOLN
1.0000 g | INTRAVENOUS | Status: DC
  Filled 2014-01-29: qty 10

## 2014-01-29 MED ORDER — SODIUM BICARBONATE 8.4 % IV SOLN
INTRAVENOUS | Status: DC
  Administered 2014-01-29 – 2014-01-30 (×3): via INTRAVENOUS
  Filled 2014-01-29 (×5): qty 1000

## 2014-01-29 MED ORDER — DEXTROSE 5 % IV SOLN
420.0000 mg | INTRAVENOUS | Status: DC
  Administered 2014-01-29: 420 mg via INTRAVENOUS
  Filled 2014-01-29: qty 8.4

## 2014-01-29 MED ORDER — DEXTROSE-NACL 5-0.45 % IV SOLN
INTRAVENOUS | Status: DC
  Administered 2014-01-29: 08:00:00 via INTRAVENOUS

## 2014-01-29 MED ORDER — METOPROLOL TARTRATE 1 MG/ML IV SOLN
5.0000 mg | Freq: Four times a day (QID) | INTRAVENOUS | Status: DC
  Administered 2014-01-29 – 2014-02-01 (×11): 5 mg via INTRAVENOUS
  Filled 2014-01-29 (×17): qty 5

## 2014-01-29 MED ORDER — SODIUM CHLORIDE 0.9 % IV SOLN
500.0000 mg | INTRAVENOUS | Status: DC
  Administered 2014-01-29 – 2014-01-31 (×2): 500 mg via INTRAVENOUS
  Filled 2014-01-29 (×3): qty 500

## 2014-01-29 MED ORDER — DEXTROSE 5 % IV SOLN
500.0000 mg | INTRAVENOUS | Status: DC
  Filled 2014-01-29: qty 500

## 2014-01-29 MED ORDER — PIPERACILLIN-TAZOBACTAM IN DEX 2-0.25 GM/50ML IV SOLN
2.2500 g | Freq: Four times a day (QID) | INTRAVENOUS | Status: DC
  Administered 2014-01-29 – 2014-01-31 (×9): 2.25 g via INTRAVENOUS
  Filled 2014-01-29 (×12): qty 50

## 2014-01-29 MED ORDER — MUPIROCIN 2 % EX OINT
1.0000 "application " | TOPICAL_OINTMENT | Freq: Two times a day (BID) | CUTANEOUS | Status: AC
  Administered 2014-01-29 – 2014-02-03 (×10): 1 via NASAL
  Filled 2014-01-29: qty 22

## 2014-01-29 MED ORDER — LABETALOL HCL 5 MG/ML IV SOLN
10.0000 mg | INTRAVENOUS | Status: DC | PRN
  Administered 2014-01-29 – 2014-02-01 (×5): 10 mg via INTRAVENOUS
  Filled 2014-01-29 (×5): qty 4

## 2014-01-29 NOTE — Progress Notes (Signed)
Patient ID: Desiree BeamsKristy Hale  female  ZOX:[id number]    DOB: 1970/10/08    DOA: 01/27/2014  PCP: No primary provider on file.  Brief history of present illness  Patient is a 43 year old female with past medical history of hypertension, marijuana use who presented to [facility] 01/27/2014 after she was found unresponsive at home by a family member today. Patient was found shaking, lying on the floor, unable to speak. Patient's son at the bedside says he spoke with her over the phone at 4 PM one day prior to the admission and patient seemed to be fine. Neurology consulted. Patient was admitted for acute encephalopathy and dehydration. On 10/23, response was called due to tachypnea and acute respiratory distress, chest x-ray consistent with right lung pneumonia possibly due to aspiration at the time of seizures. Patient spiking temp, chills,   Assessment/Plan: Principal Problem:   Acute respiratory failure likely due to aspiration pneumonia, SIRS - Patient seen and examined earlier this morning during rapid response, having tachypnea, tachycardia - Chest x-ray showed increased density in the right lung base was placed on IV vancomycin and Zosyn - Calcitonin 0.54, follow blood cultures - Stat ABG showed respiratory failure compensated with metabolic acidosis bicarbonate 13   Active Problems:   acute encephalopathy; no improvement since admission - I talked to her husband in detail this morning, he reported that patient was in her normal state of health (oriented x3, conversing) before this admission. [place] for shingles on her face, since April she has lost about 50-60 lbs unintentionally. She hasn't seen her PCP in one year, he does not remember the name of the PCP. He reported that she probably has been having Pap smears and mammograms.  - Given her mental status, fever, I am concerned about some underlying disease, possible herpes encephalitis or meningitis, discussed with  infectious disease, Dr. Ilsa IhaSnyder. I have ordered influenza panel, urine legionella antigen, urine strep antigen, HIV, RPR, LP with CSF studies (discussed with neurology, Dr. Cyril Mourningamillo will attempt lumbar puncture). - EEG negative for acute epileptiform activity   Seizures - Continue IV Keppra  High anion gap metabolic acidosis with acute renal insufficiency: Unclear etiology - Placed on IV fluids with bicarbonate    HTN (hypertension) with Tachycardia    Microcytic anemia: Hemoglobin currently stable   elevated troponins: Likely due to demand ischemia from seizures, SIRS  - Troponins trending down, 2-D echo showed EF of 55-60%, grade 1 diastolic dysfunction  - Continue IV Lopressor, aspirin pr, statin when taking oral  DVT Prophylaxis:  Code Status: FULL CODE  Family Communication: d/w patient's husband Danne Baxternthony Toro   Disposition: transferred to step down unit   Consultants:  Infectious disease, Dr. Ilsa IhaSnyder    neurology, Dr. Cyril Mourningamillo  Procedures:  MRI brain    2-D echo  Antibiotics:  IV vancomycin    IV Zosyn   Subjective: Patient seen and examined, rapid response this morning, not following verbal commands, having difficulty breathing, shivering   Objective: Weight change: 1.894 kg (4 lb 2.8 oz)  Intake/Output Summary (Last 24 hours) at 01/29/14 0931 Last data filed at 01/29/14 0900  Gross per 24 hour  Intake    200 ml  Output      1 ml  Net    199 ml   Blood pressure 176/96, pulse 123, temperature 100.8 F (38.2 C), temperature source Oral, resp. rate 30, height 5\' 7"  (1.702 m), weight 42.094 kg (92 lb 12.8 oz), SpO2  95.00%.  Physical Exam: General: not following verbal commands, confused  CVS: S1-S2 clear, no murmur rubs or gallops Chest:  coarse breath sounds throughout  Abdomen: soft nontender, nondistended, normal bowel sounds  Extremities: no cyanosis, clubbing or edema noted bilaterally Neuro:  not following commands   Lab Results: Basic Metabolic  Panel:  Recent Labs Lab 01/28/14 0137 01/29/14 0658  NA 141 146  K 4.7 4.2  CL 107 113*  CO2 15* 13*  GLUCOSE 102* 83  BUN 76* 87*  CREATININE 2.59* 2.67*  CALCIUM 9.3 9.8   Liver Function Tests:  Recent Labs Lab 01/27/14 1512 01/28/14 0137  AST 15 14  ALT 8 6  ALKPHOS 96 77  BILITOT 0.4 0.3  PROT 9.8* 7.9  ALBUMIN 3.4* 2.7*   No results found for this basename: LIPASE, AMYLASE,  in the last 168 hours  Recent Labs Lab 01/27/14 1514 01/29/14 0845  AMMONIA 21 22   CBC:  Recent Labs Lab 01/27/14 1512 01/28/14 0137 01/29/14 0658  WBC 5.6 6.7 10.2  NEUTROABS 4.5  --   --   HGB 10.9* 9.2* 8.3*  HCT 32.8* 27.8* 25.7*  MCV 74.9* 76.8* 76.5*  PLT 383 295 283   Cardiac Enzymes:  Recent Labs Lab 01/27/14 1524  01/28/14 1500 01/28/14 1900 01/29/14 0105  CKTOTAL 58  --   --   --   --   TROPONINI  --   < > 0.53* 0.52* 0.49*  < > = values in this interval not displayed. BNP: No components found with this basename: POCBNP,  CBG:  Recent Labs Lab 01/28/14 1237 01/28/14 1625 01/28/14 2134 01/29/14 0651 01/29/14 0803  GLUCAP 103* 84 83 83 79     Micro Results: Recent Results (from the past 240 hour(s))  URINE CULTURE     Status: None   Collection Time    01/27/14  3:26 PM      Result Value Ref Range Status   Specimen Description URINE, CATHETERIZED   Final   Special Requests NONE   Final   Culture  Setup Time     Final   Value: 01/27/2014 22:22     Performed at [facility]   Colony Count     Final   Value: NO GROWTH     Performed at [facility]   Culture     Final   Value: NO GROWTH     Performed at [facility]   Report Status 01/28/2014 FINAL   Final    Studies/Results: Ct Head Wo Contrast  01/27/2014   CLINICAL DATA:  Found unresponsive.  Altered mental status.  EXAM: CT HEAD WITHOUT CONTRAST  CT CERVICAL SPINE WITHOUT CONTRAST  TECHNIQUE: Multidetector CT imaging of the head and cervical spine was performed  following the standard protocol without intravenous contrast. Multiplanar CT image reconstructions of the cervical spine were also generated.  COMPARISON:  None.  FINDINGS: CT HEAD FINDINGS  The ventricles are normal in size and configuration. No extra-axial fluid collections are identified. The gray-white differentiation is normal. No CT findings for acute intracranial process such as hemorrhage or infarction. No mass lesions. The brainstem and cerebellum are grossly normal.  The bony structures are intact. The paranasal sinuses and mastoid air cells are clear. The globes are intact.  CT CERVICAL SPINE FINDINGS  Normal alignment of the cervical vertebral bodies. Disc spaces and vertebral bodies are maintained. No acute fracture or abnormal prevertebral soft tissue swelling. The facets are normally aligned. The  skullbase C1 and C1-2 articulations are maintained. The dens is normal. No large disc protrusions, spinal or foraminal stenosis. The lung apices are clear. Emphysematous changes are noted. There is some air in the supraclavicular fossa possibly due to a laceration. No pneumothorax is identified.  IMPRESSION: No acute intracranial findings or skull fracture.  Normal CT examination of the cervical spine. No cervical spine fracture.   Electronically Signed   By: Loralie Champagne M.D.   On: 01/27/2014 17:27   Ct Cervical Spine Wo Contrast  01/27/2014   CLINICAL DATA:  Found unresponsive.  Altered mental status.  EXAM: CT HEAD WITHOUT CONTRAST  CT CERVICAL SPINE WITHOUT CONTRAST  TECHNIQUE: Multidetector CT imaging of the head and cervical spine was performed following the standard protocol without intravenous contrast. Multiplanar CT image reconstructions of the cervical spine were also generated.  COMPARISON:  None.  FINDINGS: CT HEAD FINDINGS  The ventricles are normal in size and configuration. No extra-axial fluid collections are identified. The gray-white differentiation is normal. No CT findings for  acute intracranial process such as hemorrhage or infarction. No mass lesions. The brainstem and cerebellum are grossly normal.  The bony structures are intact. The paranasal sinuses and mastoid air cells are clear. The globes are intact.  CT CERVICAL SPINE FINDINGS  Normal alignment of the cervical vertebral bodies. Disc spaces and vertebral bodies are maintained. No acute fracture or abnormal prevertebral soft tissue swelling. The facets are normally aligned. The skullbase C1 and C1-2 articulations are maintained. The dens is normal. No large disc protrusions, spinal or foraminal stenosis. The lung apices are clear. Emphysematous changes are noted. There is some air in the supraclavicular fossa possibly due to a laceration. No pneumothorax is identified.  IMPRESSION: No acute intracranial findings or skull fracture.  Normal CT examination of the cervical spine. No cervical spine fracture.   Electronically Signed   By: Loralie Champagne M.D.   On: 01/27/2014 17:27   Mr Brain Wo Contrast  01/28/2014   CLINICAL DATA:  Unresponsive. The patient had an episode with loss of consciousness. She has been aphasic since that episode despite regaining who consciousness otherwise.  EXAM: MRI HEAD WITHOUT CONTRAST  TECHNIQUE: Multiplanar, multiecho pulse sequences of the brain and surrounding structures were obtained without intravenous contrast.  COMPARISON:  CT head without contrast 01/27/2014.  FINDINGS: Abnormal cortical and subcortical T2 hyperintensity is evident in the posterior insular cortex and along the sylvian fissure bilaterally. There is abnormal signal extending into the operculum bilaterally, left greater than right. This signal abnormality extends nearly to the vertex within the precentral sulcus. There is no significant associated restricted diffusion. No hemorrhage or mass lesion is present. There is no significant mass effect.  Flow is present in the major intracranial arteries. The globes and orbits are  intact. Mild mucosal thickening is noted in the ethmoid air cells and sphenoid sinuses bilaterally.  The coronal T2 weighted images are somewhat degraded by patient motion. No definite hippocampal abnormality is evident.  IMPRESSION: 1. Diffuse cortical and subcortical signal abnormality bilaterally within the temporal and posterior frontal lobes as described. This is most likely a postictal phenomenon. There is no restricted diffusion to suggest acute or subacute ischemia. No mass lesion is evident. This could be related to elevated blood pressure similar to posterior reversible encephalopathy syndrome. The distribution is atypical. Recommend follow-up MRI of the brain without and with contrast in 2-3 weeks as the symptoms resolve. 2. Minimal sinus disease.   Electronically Signed  By: Gennette Pac M.D.   On: 01/28/2014 10:27   Dg Chest Port 1 View  01/29/2014   CLINICAL DATA:  Shortness of breath, unresponsive  EXAM: PORTABLE CHEST - 1 VIEW  COMPARISON:  01/27/2014  FINDINGS: The cardiac shadow is stable. The lungs are well aerated bilaterally. Increasing density in the medial right lung base is noted consistent with atelectasis and/or early infiltrate. No pneumothorax or sizable effusion is seen.  IMPRESSION: Increased density in the right lung base related to atelectasis/early infiltrate.   Electronically Signed   By: Alcide Clever M.D.   On: 01/29/2014 06:55   Dg Chest Portable 1 View  01/27/2014   CLINICAL DATA:  Altered mental status  EXAM: PORTABLE CHEST - 1 VIEW  COMPARISON:  None.  FINDINGS: The patient is rotated. There is no focal parenchymal opacity, pleural effusion, or pneumothorax. The heart and mediastinal contours are unremarkable.  The osseous structures are unremarkable.  IMPRESSION: No active disease.   Electronically Signed   By: Elige Ko   On: 01/27/2014 16:17    Medications: Scheduled Meds: . antiseptic oral rinse  7 mL Mouth Rinse q12n4p  . aspirin  300 mg Rectal Daily    . chlorhexidine  15 mL Mouth Rinse BID  . enoxaparin (LOVENOX) injection  30 mg Subcutaneous Q24H  . levETIRAcetam  500 mg Intravenous Q12H  . metoprolol  5 mg Intravenous 4 times per day  . piperacillin-tazobactam (ZOSYN)  IV  2.25 g Intravenous 4 times per day  . sodium chloride  3 mL Intravenous Q12H  . vancomycin  500 mg Intravenous Q48H      LOS: 2 days   Cristela Stalder M.D. [facility] 01/29/2014, 9:31 AM Pager: [phone]  If 7PM-7AM, please contact night-coverage [email or web address] Password [email or web address]

## 2014-01-29 NOTE — Progress Notes (Signed)
C diff negative, contact precautions DC'ed, remains on droplet until flu results.

## 2014-01-29 NOTE — Progress Notes (Signed)
[facility].

## 2014-01-29 NOTE — Progress Notes (Signed)
Many atempts to fax [facility] for pt [facility].

## 2014-01-29 NOTE — Progress Notes (Addendum)
RN paged this am secondary to pt having increased WOB, tachypnea, and tachycardia with worsening responsiveness. RRRN to bedside and this NP spoke to her. NP to bedside. S: pt can not participate in ROS secondary to mental status.  O: Frail, AAF in moderate acute respiratory distress. T 99.4 r. Shivering noted. Skin warm and dry. BP 160s. RR 30s. O2 sat 96% on 2L. Opens eyes and moans to tactile stimulation. Withdraws to pain x 4 extremities. Non verbal. Follows no commands (however, this has been her baseline since admission yesterday). Reg rhythm. EKG showed ST in 130s. ABG with low PCO2 in the 20s, slightly low PO2 in the 70s, normal pH. Lungs congested on the right lower lobe. No wheezing.  A/P: 1. Acute respiratory distress-? Secondary to PNA.  CXR with ? PNA RLL awaiting official reading.  Given acute tachypnea and ? PNA with shivering and increased rectal temp, she could be developing SIRS. Check Lactate, CBC, BMP. Will move to SDU in anticipation of her becoming worse this am and possibly needing bipap.  Will report to oncoming [facility] at 7am-Dr. Isidoro Donningai on unit and is aware of situation. CXR confirmed PNA, Suspicion for aspiration PNA given seizures and mental status. Started Vanc and Zosyn.   Jimmye NormanKaren Kirby-Graham, NP [facility]

## 2014-01-29 NOTE — Consult Note (Addendum)
[facility]  Total days of antibiotics 1        Day 1 vanco        Day 1 piptazo               Reason for Consult: encephalopathy & fevers    Referring Physician: Tana Coast  Principal Problem:   Acute respiratory failure Active Problems:   Altered mental state   HTN (hypertension)   Tachycardia   Metabolic acidosis   Hyperglycemia   Acute renal failure   Microcytic anemia   Hypercalcemia   Aspiration pneumonia   Seizures    HPI: Desiree Hale is a 43 y.o. female  with past medical history of hypertension was brought [facility] ED 01/27/2014 after being found unresponsive at home by a family member. She reportedly have seizure like activity with inability to speak found covered in urine and feces. Patient appeared normal 1 day prior to event. She was not in any respiratory distress . No fevers. No vomiting.  In ED, blood pressure is 160/112, heart rate 120, Tmax 98.41F and oxygen saturation 100% on room air. Blood work revealed wbc 5.6, TLC 0.4, hemoglobin of 10.9, CO2 16, creatinine 3.07, calcium 10.6, glucose 147. CT head shows no acute intracranial findings. Chest x-ray showed no active disease. Received IVF hydration, started on anti-epileptics. She also underwent mri that showed diffuse cortical and subcortical signal abnormality bilaterally within the temporal and posterior frontal lobes as described.There is no restricted  diffusion to suggest acute or subacute ischemia. Overnight had fever of 100.8 with CXR showing possible infiltrate of RLL, concerning for aspiration. She was started on vancomycin and piptazo.  Family member reports she has had significant unintentional weight loss in the last year, as well as a few episodes of zoster.    History reviewed. No pertinent past medical history.  Allergies:  Allergies  Allergen Reactions  . Oxycodone Other (See Comments)   MEDICATIONS: . antiseptic oral rinse  7 mL Mouth Rinse q12n4p  . aspirin  300 mg Rectal  Daily  . chlorhexidine  15 mL Mouth Rinse BID  . enoxaparin (LOVENOX) injection  30 mg Subcutaneous Q24H  . levETIRAcetam  500 mg Intravenous Q12H  . metoprolol  5 mg Intravenous 4 times per day  . piperacillin-tazobactam (ZOSYN)  IV  2.25 g Intravenous 4 times per day  . sodium chloride  3 mL Intravenous Q12H  . vancomycin  500 mg Intravenous Q48H    History  Substance Use Topics  . Smoking status: Not on file  . Smokeless tobacco: Not on file  . Alcohol Use: Not on file    No family history on file.  Review of Systems - Patient is encephalopathic unable to answer quesitons  OBJECTIVE: Temp:  [97.9 F (36.6 C)-100.8 F (38.2 C)] 100 F (37.8 C) (10/23 1200) Pulse Rate:  [100-135] 110 (10/23 1400) Resp:  [20-33] 26 (10/23 1400) BP: (139-193)/(81-108) 174/81 mmHg (10/23 1400) SpO2:  [92 %-100 %] 97 % (10/23 1400) Weight:  [92 lb 12.8 oz (42.094 kg)] 92 lb 12.8 oz (42.094 kg) (10/23 0458) Physical Exam  Constitutional:  Opens eyes to name, but does not track movements. She appears chronically ill, disheveled, cachetic HENT: normocephalic, with mild bitemporal wasting Mouth/Throat: Oropharynx is clear and moist. No oropharyngeal exudate.  Cardiovascular: Normal rate, regular rhythm and normal heart sounds. Exam reveals no gallop and no friction rub.  No murmur heard.  Pulmonary/Chest: Effort normal and breath sounds normal. No  respiratory distress.  has no wheezes.  Abdominal: Soft. Bowel sounds are normal.  exhibits no distension. There is no tenderness.  Lymphadenopathy: no cervical adenopathy.  Neurological: moves all extremities to touch Skin: Skin is warm and dry. No rash noted. No erythema.  Ext: muscle wasting to extremities  LABS: Results for orders placed during the hospital encounter of 01/27/14 (from the past 48 hour(s))  CBC WITH DIFFERENTIAL     Status: Abnormal   Collection Time    01/27/14  3:12 PM      Result Value Ref Range   WBC 5.6  4.0 - 10.5 K/uL     RBC 4.38  3.87 - 5.11 MIL/uL   Hemoglobin 10.9 (*) 12.0 - 15.0 g/dL   HCT 32.8 (*) 36.0 - 46.0 %   MCV 74.9 (*) 78.0 - 100.0 fL   MCH 24.9 (*) 26.0 - 34.0 pg   MCHC 33.2  30.0 - 36.0 g/dL   RDW 16.2 (*) 11.5 - 15.5 %   Platelets 383  150 - 400 K/uL   Neutrophils Relative % 81 (*) 43 - 77 %   Neutro Abs 4.5  1.7 - 7.7 K/uL   Lymphocytes Relative 8 (*) 12 - 46 %   Lymphs Abs 0.4 (*) 0.7 - 4.0 K/uL   Monocytes Relative 11  3 - 12 %   Monocytes Absolute 0.6  0.1 - 1.0 K/uL   Eosinophils Relative 0  0 - 5 %   Eosinophils Absolute 0.0  0.0 - 0.7 K/uL   Basophils Relative 0  0 - 1 %   Basophils Absolute 0.0  0.0 - 0.1 K/uL  COMPREHENSIVE METABOLIC PANEL     Status: Abnormal   Collection Time    01/27/14  3:12 PM      Result Value Ref Range   Sodium 137  137 - 147 mEq/L   Potassium 5.0  3.7 - 5.3 mEq/L   Chloride 94 (*) 96 - 112 mEq/L   CO2 16 (*) 19 - 32 mEq/L   Glucose, Bld 147 (*) 70 - 99 mg/dL   BUN 75 (*) 6 - 23 mg/dL   Creatinine, Ser 3.07 (*) 0.50 - 1.10 mg/dL   Calcium 10.6 (*) 8.4 - 10.5 mg/dL   Total Protein 9.8 (*) 6.0 - 8.3 g/dL   Albumin 3.4 (*) 3.5 - 5.2 g/dL   AST 15  0 - 37 U/L   ALT 8  0 - 35 U/L   Alkaline Phosphatase 96  39 - 117 U/L   Total Bilirubin 0.4  0.3 - 1.2 mg/dL   GFR calc non Af Amer 9 (*) >90 mL/min   GFR calc Af Amer 10 (*) >90 mL/min   Comment: (NOTE)     The eGFR has been calculated using the CKD EPI equation.     This calculation has not been validated in all clinical situations.     eGFR's persistently <90 mL/min signify possible Chronic Kidney     Disease.   Anion gap 27 (*) 5 - 15  CBG MONITORING, ED     Status: Abnormal   Collection Time    01/27/14  3:12 PM      Result Value Ref Range   Glucose-Capillary 168 (*) 70 - 99 mg/dL  ETHANOL     Status: None   Collection Time    01/27/14  3:12 PM      Result Value Ref Range   Alcohol, Ethyl (B) <11  0 - 11 mg/dL  Comment:            LOWEST DETECTABLE LIMIT FOR     SERUM ALCOHOL IS  11 mg/dL     FOR MEDICAL PURPOSES ONLY  PROTIME-INR     Status: None   Collection Time    01/27/14  3:12 PM      Result Value Ref Range   Prothrombin Time 13.9  11.6 - 15.2 seconds   INR 1.06  0.00 - 1.49  AMMONIA     Status: None   Collection Time    01/27/14  3:14 PM      Result Value Ref Range   Ammonia 21  11 - 60 umol/L  CK     Status: None   Collection Time    01/27/14  3:24 PM      Result Value Ref Range   Total CK 58  7 - 177 U/L  URINALYSIS, ROUTINE W REFLEX MICROSCOPIC     Status: Abnormal   Collection Time    01/27/14  3:26 PM      Result Value Ref Range   Color, Urine YELLOW  YELLOW   APPearance CLEAR  CLEAR   Specific Gravity, Urine 1.019  1.005 - 1.030   pH 5.0  5.0 - 8.0   Glucose, UA NEGATIVE  NEGATIVE mg/dL   Hgb urine dipstick TRACE (*) NEGATIVE   Bilirubin Urine NEGATIVE  NEGATIVE   Ketones, ur NEGATIVE  NEGATIVE mg/dL   Protein, ur 100 (*) NEGATIVE mg/dL   Urobilinogen, UA 0.2  0.0 - 1.0 mg/dL   Nitrite NEGATIVE  NEGATIVE   Leukocytes, UA NEGATIVE  NEGATIVE  URINE RAPID DRUG SCREEN (HOSP PERFORMED)     Status: Abnormal   Collection Time    01/27/14  3:26 PM      Result Value Ref Range   Opiates NONE DETECTED  NONE DETECTED   Cocaine NONE DETECTED  NONE DETECTED   Benzodiazepines NONE DETECTED  NONE DETECTED   Amphetamines NONE DETECTED  NONE DETECTED   Tetrahydrocannabinol POSITIVE (*) NONE DETECTED   Barbiturates NONE DETECTED  NONE DETECTED   Comment:            DRUG SCREEN FOR MEDICAL PURPOSES     ONLY.  IF CONFIRMATION IS NEEDED     FOR ANY PURPOSE, NOTIFY LAB     WITHIN 5 DAYS.                LOWEST DETECTABLE LIMITS     FOR URINE DRUG SCREEN     Drug Class       Cutoff (ng/mL)     Amphetamine      1000     Barbiturate      200     Benzodiazepine   030     Tricyclics       092     Opiates          300     Cocaine          300     THC              50  URINE CULTURE     Status: None   Collection Time    01/27/14  3:26 PM       Result Value Ref Range   Specimen Description URINE, CATHETERIZED     Special Requests NONE     Culture  Setup Time       Value: 01/27/2014 22:22  Performed at [place]       Value: NO GROWTH     Performed at [facility]   Culture       Value: NO GROWTH     Performed at [facility]   Report Status 01/28/2014 FINAL    URINE MICROSCOPIC-ADD ON     Status: Abnormal   Collection Time    01/27/14  3:26 PM      Result Value Ref Range   Squamous Epithelial / LPF FEW (*) RARE   WBC, UA 3-6  <3 WBC/hpf   RBC / HPF 0-2  <3 RBC/hpf   Bacteria, UA FEW (*) RARE   Casts GRANULAR CAST (*) NEGATIVE   Comment: HYALINE CASTS   Urine-Other MUCOUS PRESENT    I-STAT TROPOININ, ED     Status: None   Collection Time    01/27/14  3:52 PM      Result Value Ref Range   Troponin i, poc 0.07  0.00 - 0.08 ng/mL   Comment 3            Comment: Due to the release kinetics of cTnI,     a negative result within the first hours     of the onset of symptoms does not rule out     myocardial infarction with certainty.     If myocardial infarction is still suspected,     repeat the test at appropriate intervals.  I-STAT CG4 LACTIC ACID, ED     Status: Abnormal   Collection Time    01/27/14  3:55 PM      Result Value Ref Range   Lactic Acid, Venous 3.65 (*) 0.5 - 2.2 mmol/L  TSH     Status: None   Collection Time    01/27/14  4:21 PM      Result Value Ref Range   TSH 1.690  0.350 - 4.500 uIU/mL  HEMOGLOBIN A1C     Status: Abnormal   Collection Time    01/27/14  6:38 PM      Result Value Ref Range   Hemoglobin A1C 5.8 (*) <5.7 %   Comment: (NOTE)                                                                               According to the ADA Clinical Practice Recommendations for 2011, when     HbA1c is used as a screening test:      >=6.5%   Diagnostic of Diabetes Mellitus               (if abnormal result is confirmed)     5.7-6.4%   Increased risk  of developing Diabetes Mellitus     References:Diagnosis and Classification of Diabetes Mellitus,Diabetes     DZHG,9924,26(STMHD 1):S62-S69 and Standards of Medical Care in             Diabetes - 2011,Diabetes Care,2011,34 (Suppl 1):S11-S61.   Mean Plasma Glucose 120 (*) <117 mg/dL   Comment: Performed at [facility], ED     Status: None   Collection Time    01/27/14  6:42 PM  Result Value Ref Range   Lactic Acid, Venous 1.57  0.5 - 2.2 mmol/L  TROPONIN I     Status: Abnormal   Collection Time    01/27/14  7:56 PM      Result Value Ref Range   Troponin I 0.58 (*) <0.30 ng/mL   Comment:            Due to the release kinetics of cTnI,     a negative result within the first hours     of the onset of symptoms does not rule out     myocardial infarction with certainty.     If myocardial infarction is still suspected,     repeat the test at appropriate intervals.     CRITICAL RESULT CALLED TO, READ BACK BY AND VERIFIED WITH:     S.BASS RN [id number] 01/27/14 E.GADDY  TSH     Status: None   Collection Time    01/27/14  8:30 PM      Result Value Ref Range   TSH 0.883  0.350 - 4.500 uIU/mL  GLUCOSE, CAPILLARY     Status: Abnormal   Collection Time    01/27/14 10:11 PM      Result Value Ref Range   Glucose-Capillary 108 (*) 70 - 99 mg/dL   Comment 1 Documented in Chart     Comment 2 Notify RN    HEMOGLOBIN A1C     Status: Abnormal   Collection Time    01/28/14  1:37 AM      Result Value Ref Range   Hemoglobin A1C 5.7 (*) <5.7 %   Comment: (NOTE)                                                                               According to the ADA Clinical Practice Recommendations for 2011, when     HbA1c is used as a screening test:      >=6.5%   Diagnostic of Diabetes Mellitus               (if abnormal result is confirmed)     5.7-6.4%   Increased risk of developing Diabetes Mellitus     References:Diagnosis and Classification of Diabetes  [place],Diabetes     HFWY,6378,58(IFOYD 1):S62-S69 and Standards of Medical Care in             Diabetes - 2011,Diabetes Care,2011,34 (Suppl 1):S11-S61.   Mean Plasma Glucose 117 (*) <117 mg/dL   Comment: Performed at [facility]  TROPONIN I     Status: Abnormal   Collection Time    01/28/14  1:37 AM      Result Value Ref Range   Troponin I 0.55 (*) <0.30 ng/mL   Comment:            Due to the release kinetics of cTnI,     a negative result within the first hours     of the onset of symptoms does not rule out     myocardial infarction with certainty.     If myocardial infarction is still suspected,     repeat the test at appropriate intervals.     CRITICAL VALUE NOTED.  VALUE  IS CONSISTENT WITH PREVIOUSLY REPORTED AND CALLED VALUE.  COMPREHENSIVE METABOLIC PANEL     Status: Abnormal   Collection Time    01/28/14  1:37 AM      Result Value Ref Range   Sodium 141  137 - 147 mEq/L   Potassium 4.7  3.7 - 5.3 mEq/L   Chloride 107  96 - 112 mEq/L   Comment: DELTA CHECK NOTED   CO2 15 (*) 19 - 32 mEq/L   Glucose, Bld 102 (*) 70 - 99 mg/dL   BUN 76 (*) 6 - 23 mg/dL   Creatinine, Ser 2.59 (*) 0.50 - 1.10 mg/dL   Calcium 9.3  8.4 - 10.5 mg/dL   Total Protein 7.9  6.0 - 8.3 g/dL   Albumin 2.7 (*) 3.5 - 5.2 g/dL   AST 14  0 - 37 U/L   ALT 6  0 - 35 U/L   Alkaline Phosphatase 77  39 - 117 U/L   Total Bilirubin 0.3  0.3 - 1.2 mg/dL   GFR calc non Af Amer 21 (*) >90 mL/min   GFR calc Af Amer 25 (*) >90 mL/min   Comment: (NOTE)     The eGFR has been calculated using the CKD EPI equation.     This calculation has not been validated in all clinical situations.     eGFR's persistently <90 mL/min signify possible Chronic Kidney     Disease.   Anion gap 19 (*) 5 - 15  CBC     Status: Abnormal   Collection Time    01/28/14  1:37 AM      Result Value Ref Range   WBC 6.7  4.0 - 10.5 K/uL   RBC 3.62 (*) 3.87 - 5.11 MIL/uL   Hemoglobin 9.2 (*) 12.0 - 15.0 g/dL   HCT 27.8 (*) 36.0  - 46.0 %   MCV 76.8 (*) 78.0 - 100.0 fL   MCH 25.4 (*) 26.0 - 34.0 pg   MCHC 33.1  30.0 - 36.0 g/dL   RDW 16.6 (*) 11.5 - 15.5 %   Platelets 295  150 - 400 K/uL  LIPID PANEL     Status: Abnormal   Collection Time    01/28/14  1:37 AM      Result Value Ref Range   Cholesterol 182  0 - 200 mg/dL   Triglycerides 119  <150 mg/dL   HDL 54  >39 mg/dL   Total CHOL/HDL Ratio 3.4     VLDL 24  0 - 40 mg/dL   LDL Cholesterol 104 (*) 0 - 99 mg/dL   Comment:            Total Cholesterol/HDL:CHD Risk     Coronary Heart Disease Risk Table                         Men   Women      1/2 Average Risk   3.4   3.3      Average Risk       5.0   4.4      2 X Average Risk   9.6   7.1      3 X Average Risk  23.4   11.0                Use the calculated Patient Ratio     above and the CHD Risk Table     to determine the patient's CHD Risk.  ATP III CLASSIFICATION (LDL):      <100     mg/dL   Optimal      100-129  mg/dL   Near or Above                        Optimal      130-159  mg/dL   Borderline      160-189  mg/dL   High      >190     mg/dL   Very High  GLUCOSE, CAPILLARY     Status: None   Collection Time    01/28/14  7:01 AM      Result Value Ref Range   Glucose-Capillary 94  70 - 99 mg/dL   Comment 1 Documented in Chart     Comment 2 Notify RN    TROPONIN I     Status: Abnormal   Collection Time    01/28/14  9:16 AM      Result Value Ref Range   Troponin I 0.56 (*) <0.30 ng/mL   Comment:            Due to the release kinetics of cTnI,     a negative result within the first hours     of the onset of symptoms does not rule out     myocardial infarction with certainty.     If myocardial infarction is still suspected,     repeat the test at appropriate intervals.     CRITICAL VALUE NOTED.  VALUE IS CONSISTENT WITH PREVIOUSLY REPORTED AND CALLED VALUE.  GLUCOSE, CAPILLARY     Status: Abnormal   Collection Time    01/28/14 12:37 PM      Result Value Ref Range    Glucose-Capillary 103 (*) 70 - 99 mg/dL  TROPONIN I     Status: Abnormal   Collection Time    01/28/14  3:00 PM      Result Value Ref Range   Troponin I 0.53 (*) <0.30 ng/mL   Comment:            Due to the release kinetics of cTnI,     a negative result within the first hours     of the onset of symptoms does not rule out     myocardial infarction with certainty.     If myocardial infarction is still suspected,     repeat the test at appropriate intervals.     CRITICAL VALUE NOTED.  VALUE IS CONSISTENT WITH PREVIOUSLY REPORTED AND CALLED VALUE.  GLUCOSE, CAPILLARY     Status: None   Collection Time    01/28/14  4:25 PM      Result Value Ref Range   Glucose-Capillary 84  70 - 99 mg/dL  TROPONIN I     Status: Abnormal   Collection Time    01/28/14  7:00 PM      Result Value Ref Range   Troponin I 0.52 (*) <0.30 ng/mL   Comment:            Due to the release kinetics of cTnI,     a negative result within the first hours     of the onset of symptoms does not rule out     myocardial infarction with certainty.     If myocardial infarction is still suspected,     repeat the test at appropriate intervals.     CRITICAL VALUE NOTED.  VALUE IS CONSISTENT WITH PREVIOUSLY  REPORTED AND CALLED VALUE.  GLUCOSE, CAPILLARY     Status: None   Collection Time    01/28/14  9:34 PM      Result Value Ref Range   Glucose-Capillary 83  70 - 99 mg/dL   Comment 1 Notify RN     Comment 2 Documented in Chart    TROPONIN I     Status: Abnormal   Collection Time    01/29/14  1:05 AM      Result Value Ref Range   Troponin I 0.49 (*) <0.30 ng/mL   Comment:            Due to the release kinetics of cTnI,     a negative result within the first hours     of the onset of symptoms does not rule out     myocardial infarction with certainty.     If myocardial infarction is still suspected,     repeat the test at appropriate intervals.     CRITICAL VALUE NOTED.  VALUE IS CONSISTENT WITH PREVIOUSLY  REPORTED AND CALLED VALUE.  BLOOD GAS, ARTERIAL     Status: Abnormal   Collection Time    01/29/14  6:33 AM      Result Value Ref Range   O2 Content 2.0     Delivery systems NASAL CANNULA     pH, Arterial 7.369  7.350 - 7.450   pCO2 arterial 22.9 (*) 35.0 - 45.0 mmHg   pO2, Arterial 71.8 (*) 80.0 - 100.0 mmHg   Bicarbonate 12.9 (*) 20.0 - 24.0 mEq/L   TCO2 13.6  0 - 100 mmol/L   Acid-base deficit 11.4 (*) 0.0 - 2.0 mmol/L   O2 Saturation 92.7     Patient temperature 98.6     Collection site RIGHT RADIAL     Drawn by [id number]     Sample type ARTERIAL DRAW     Allens test (pass/fail) PASS  PASS  GLUCOSE, CAPILLARY     Status: None   Collection Time    01/29/14  6:51 AM      Result Value Ref Range   Glucose-Capillary 83  70 - 99 mg/dL   Comment 1 Notify RN     Comment 2 Documented in Chart    BASIC METABOLIC PANEL     Status: Abnormal   Collection Time    01/29/14  6:58 AM      Result Value Ref Range   Sodium 146  137 - 147 mEq/L   Potassium 4.2  3.7 - 5.3 mEq/L   Chloride 113 (*) 96 - 112 mEq/L   CO2 13 (*) 19 - 32 mEq/L   Glucose, Bld 83  70 - 99 mg/dL   BUN 87 (*) 6 - 23 mg/dL   Creatinine, Ser 2.67 (*) 0.50 - 1.10 mg/dL   Calcium 9.8  8.4 - 10.5 mg/dL   GFR calc non Af Amer 21 (*) >90 mL/min   GFR calc Af Amer 24 (*) >90 mL/min   Comment: (NOTE)     The eGFR has been calculated using the CKD EPI equation.     This calculation has not been validated in all clinical situations.     eGFR's persistently <90 mL/min signify possible Chronic Kidney     Disease.   Anion gap 20 (*) 5 - 15  CBC     Status: Abnormal   Collection Time    01/29/14  6:58 AM      Result Value Ref Range  WBC 10.2  4.0 - 10.5 K/uL   RBC 3.36 (*) 3.87 - 5.11 MIL/uL   Hemoglobin 8.3 (*) 12.0 - 15.0 g/dL   HCT 25.7 (*) 36.0 - 46.0 %   MCV 76.5 (*) 78.0 - 100.0 fL   MCH 24.7 (*) 26.0 - 34.0 pg   MCHC 32.3  30.0 - 36.0 g/dL   RDW 17.0 (*) 11.5 - 15.5 %   Platelets 283  150 - 400 K/uL  LACTIC ACID,  PLASMA     Status: None   Collection Time    01/29/14  7:31 AM      Result Value Ref Range   Lactic Acid, Venous 1.1  0.5 - 2.2 mmol/L  GLUCOSE, CAPILLARY     Status: None   Collection Time    01/29/14  8:03 AM      Result Value Ref Range   Glucose-Capillary 79  70 - 99 mg/dL   Comment 1 Documented in Chart     Comment 2 Notify RN    AMMONIA     Status: None   Collection Time    01/29/14  8:45 AM      Result Value Ref Range   Ammonia 22  11 - 60 umol/L  HEPATIC FUNCTION PANEL     Status: Abnormal   Collection Time    01/29/14  8:45 AM      Result Value Ref Range   Total Protein 7.5  6.0 - 8.3 g/dL   Albumin 2.8 (*) 3.5 - 5.2 g/dL   AST 16  0 - 37 U/L   ALT 6  0 - 35 U/L   Alkaline Phosphatase 69  39 - 117 U/L   Total Bilirubin 0.4  0.3 - 1.2 mg/dL   Bilirubin, Direct <0.2  0.0 - 0.3 mg/dL   Indirect Bilirubin NOT CALCULATED  0.3 - 0.9 mg/dL  PROCALCITONIN     Status: None   Collection Time    01/29/14  8:45 AM      Result Value Ref Range   Procalcitonin 0.54     Comment:            Interpretation:     PCT > 0.5 ng/mL and <= 2 ng/mL:     Systemic infection (sepsis) is possible,     but other conditions are known to elevate     PCT as well.     (NOTE)             ICU PCT Algorithm               Non ICU PCT Algorithm        ----------------------------     ------------------------------             PCT < 0.25 ng/mL                 PCT < 0.1 ng/mL         Stopping of antibiotics            Stopping of antibiotics           strongly encouraged.               strongly encouraged.        ----------------------------     ------------------------------           PCT level decrease by               PCT < 0.25 ng/mL           >=  80% from peak PCT           OR PCT 0.25 - 0.5 ng/mL          Stopping of antibiotics                                                 encouraged.         Stopping of antibiotics               encouraged.        ----------------------------      ------------------------------           PCT level decrease by              PCT >= 0.25 ng/mL           < 80% from peak PCT            AND PCT >= 0.5 ng/mL            Continuing antibiotics                                                  encouraged.           Continuing antibiotics                encouraged.        ----------------------------     ------------------------------         PCT level increase compared          PCT > 0.5 ng/mL             with peak PCT AND              PCT >= 0.5 ng/mL             Escalation of antibiotics                                              strongly encouraged.          Escalation of antibiotics            strongly encouraged.  CRYPTOCOCCAL ANTIGEN     Status: None   Collection Time    01/29/14  9:30 AM      Result Value Ref Range   Crypto Ag NEGATIVE  NEGATIVE   Cryptococcal Ag Titer NOT INDICATED  NOT INDICATED  T-HELPER CELLS (CD4) COUNT     Status: Abnormal   Collection Time    01/29/14  9:30 AM      Result Value Ref Range   CD4 T Cell Abs 10 (*) 400 - 2700 /uL   CD4 % Helper T Cell 4 (*) 33 - 55 %   Comment: Performed at [facility] (TYPE A & B, H1N1)     Status: None   Collection Time    01/29/14 10:44 AM      Result Value Ref Range   Influenza A By PCR NEGATIVE  NEGATIVE   Influenza B By PCR NEGATIVE  NEGATIVE   H1N1 flu  by pcr NOT DETECTED  NOT DETECTED   Comment:            The Xpert Flu assay (FDA approved for     nasal aspirates or washes and     nasopharyngeal swab specimens), is     intended as an aid in the diagnosis of     influenza and should not be used as     a sole basis for treatment.  CLOSTRIDIUM DIFFICILE BY PCR     Status: None   Collection Time    01/29/14 11:10 AM      Result Value Ref Range   C difficile by pcr NEGATIVE  NEGATIVE  GLUCOSE, CAPILLARY     Status: Abnormal   Collection Time    01/29/14 12:09 PM      Result Value Ref Range   Glucose-Capillary 108  (*) 70 - 99 mg/dL   Comment 1 Documented in Chart     Comment 2 Notify RN    STREP PNEUMONIAE URINARY ANTIGEN     Status: None   Collection Time    01/29/14 12:18 PM      Result Value Ref Range   Strep Pneumo Urinary Antigen NEGATIVE  NEGATIVE   Comment:            Infection due to S. pneumoniae     cannot be absolutely ruled out     since the antigen present     may be below the detection limit     of the test.    MICRO:  IMAGING: Ct Head Wo Contrast  01/27/2014   CLINICAL DATA:  Found unresponsive.  Altered mental status.  EXAM: CT HEAD WITHOUT CONTRAST  CT CERVICAL SPINE WITHOUT CONTRAST  TECHNIQUE: Multidetector CT imaging of the head and cervical spine was performed following the standard protocol without intravenous contrast. Multiplanar CT image reconstructions of the cervical spine were also generated.  COMPARISON:  None.  FINDINGS: CT HEAD FINDINGS  The ventricles are normal in size and configuration. No extra-axial fluid collections are identified. The gray-white differentiation is normal. No CT findings for acute intracranial process such as hemorrhage or infarction. No mass lesions. The brainstem and cerebellum are grossly normal.  The bony structures are intact. The paranasal sinuses and mastoid air cells are clear. The globes are intact.  CT CERVICAL SPINE FINDINGS  Normal alignment of the cervical vertebral bodies. Disc spaces and vertebral bodies are maintained. No acute fracture or abnormal prevertebral soft tissue swelling. The facets are normally aligned. The skullbase C1 and C1-2 articulations are maintained. The dens is normal. No large disc protrusions, spinal or foraminal stenosis. The lung apices are clear. Emphysematous changes are noted. There is some air in the supraclavicular fossa possibly due to a laceration. No pneumothorax is identified.  IMPRESSION: No acute intracranial findings or skull fracture.  Normal CT examination of the cervical spine. No cervical spine  fracture.   Electronically Signed   By: Kalman Jewels M.D.   On: 01/27/2014 17:27   Ct Cervical Spine Wo Contrast  01/27/2014   CLINICAL DATA:  Found unresponsive.  Altered mental status.  EXAM: CT HEAD WITHOUT CONTRAST  CT CERVICAL SPINE WITHOUT CONTRAST  TECHNIQUE: Multidetector CT imaging of the head and cervical spine was performed following the standard protocol without intravenous contrast. Multiplanar CT image reconstructions of the cervical spine were also generated.  COMPARISON:  None.  FINDINGS: CT HEAD FINDINGS  The ventricles are normal in size and configuration. No extra-axial  fluid collections are identified. The gray-white differentiation is normal. No CT findings for acute intracranial process such as hemorrhage or infarction. No mass lesions. The brainstem and cerebellum are grossly normal.  The bony structures are intact. The paranasal sinuses and mastoid air cells are clear. The globes are intact.  CT CERVICAL SPINE FINDINGS  Normal alignment of the cervical vertebral bodies. Disc spaces and vertebral bodies are maintained. No acute fracture or abnormal prevertebral soft tissue swelling. The facets are normally aligned. The skullbase C1 and C1-2 articulations are maintained. The dens is normal. No large disc protrusions, spinal or foraminal stenosis. The lung apices are clear. Emphysematous changes are noted. There is some air in the supraclavicular fossa possibly due to a laceration. No pneumothorax is identified.  IMPRESSION: No acute intracranial findings or skull fracture.  Normal CT examination of the cervical spine. No cervical spine fracture.   Electronically Signed   By: Kalman Jewels M.D.   On: 01/27/2014 17:27   Mr Brain Wo Contrast  01/28/2014   CLINICAL DATA:  Unresponsive. The patient had an episode with loss of consciousness. She has been aphasic since that episode despite regaining who consciousness otherwise.  EXAM: MRI HEAD WITHOUT CONTRAST  TECHNIQUE: Multiplanar,  multiecho pulse sequences of the brain and surrounding structures were obtained without intravenous contrast.  COMPARISON:  CT head without contrast 01/27/2014.  FINDINGS: Abnormal cortical and subcortical T2 hyperintensity is evident in the posterior insular cortex and along the sylvian fissure bilaterally. There is abnormal signal extending into the operculum bilaterally, left greater than right. This signal abnormality extends nearly to the vertex within the precentral sulcus. There is no significant associated restricted diffusion. No hemorrhage or mass lesion is present. There is no significant mass effect.  Flow is present in the major intracranial arteries. The globes and orbits are intact. Mild mucosal thickening is noted in the ethmoid air cells and sphenoid sinuses bilaterally.  The coronal T2 weighted images are somewhat degraded by patient motion. No definite hippocampal abnormality is evident.  IMPRESSION: 1. Diffuse cortical and subcortical signal abnormality bilaterally within the temporal and posterior frontal lobes as described. This is most likely a postictal phenomenon. There is no restricted diffusion to suggest acute or subacute ischemia. No mass lesion is evident. This could be related to elevated blood pressure similar to posterior reversible encephalopathy syndrome. The distribution is atypical. Recommend follow-up MRI of the brain without and with contrast in 2-3 weeks as the symptoms resolve. 2. Minimal sinus disease.   Electronically Signed   By: Lawrence Santiago M.D.   On: 01/28/2014 10:27   Dg Chest Port 1 View  01/29/2014   CLINICAL DATA:  Shortness of breath, unresponsive  EXAM: PORTABLE CHEST - 1 VIEW  COMPARISON:  01/27/2014  FINDINGS: The cardiac shadow is stable. The lungs are well aerated bilaterally. Increasing density in the medial right lung base is noted consistent with atelectasis and/or early infiltrate. No pneumothorax or sizable effusion is seen.  IMPRESSION: Increased  density in the right lung base related to atelectasis/early infiltrate.   Electronically Signed   By: Inez Catalina M.D.   On: 01/29/2014 06:55   Dg Chest Portable 1 View  01/27/2014   CLINICAL DATA:  Altered mental status  EXAM: PORTABLE CHEST - 1 VIEW  COMPARISON:  None.  FINDINGS: The patient is rotated. There is no focal parenchymal opacity, pleural effusion, or pneumothorax. The heart and mediastinal contours are unremarkable.  The osseous structures are unremarkable.  IMPRESSION: No active  disease.   Electronically Signed   By: Kathreen Devoid   On: 01/27/2014 16:17    Assessment/Plan:  43yo F found unconscious with ongoing encephalopathy. EEg did not show ongoing change to suggest seizure activity. Placed on broad spectrum for aspiration pneumonia  - agree with primary team for HIV testing, concerning that she has had zoster, and significant weight loss with TLC of 0.4.  - will check cd 4 count and hiv viral load - if positive, will also need to get genotype, hep a ,hep b, and hep c, rpr - recommend to get LP and send fluid analysis for cell count, protein, glu, gram stain and culture. Recommend to get hsv pcr. RPR /VDRL.Save extra tube for additional testing - MRI does involve temporal lobes but also posterior frontal lobes. 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 Since patient has AKI, will not start acyclovir now but will wait to see if LP abnormal and suggestive of lymphocytic predominance then start IV acyclovir. Please get LP today - attempt to get sputum culture, continue with vancomycin and piptazo for now. - in regards to diarrhea, we will check for giardia and cryptosporidium  Dr. Johnnye Sima to follow up over the weekend to provide further details.  Elzie [facility] [phone]

## 2014-01-29 NOTE — Progress Notes (Deleted)
43yo female has inc'd WOB, tachypnea, tachycardia, and worsening responsiveness, CXR cocnerning for PNA, to begin IV ABX.  Will start Rocephin 1g IV Q24H and monitor CBC and Cx.  Vernard GamblesVeronda Noelle Sease, PharmD, BCPS 01/29/2014 7:08 AM

## 2014-01-29 NOTE — Progress Notes (Signed)
Stool to lab

## 2014-01-29 NOTE — Progress Notes (Signed)
Please hold acyclovir until after LP is done to determine if c/w hsv encephalitis. Want to avoid any unnecessary nephrotoxity. i have d/c'd acy  Yehudah Standing B. Drue SecondSnider MD MPH [facility] [phone]

## 2014-01-29 NOTE — Progress Notes (Signed)
Lab called to draw HIV viral test.

## 2014-01-29 NOTE — Evaluation (Signed)
SLP Cancellation Note  Patient Details Name: Desiree BeamsKristy Hale MRN: [id number] DOB: 1970-12-08   Cancelled treatment:       Reason Eval/Treat Not Completed: Medical issues which prohibited therapy (pt transferred to ICU due to medical issues, will follow for po readiness)   Donavan Burnetamara [name], MS [facility] S[facility] [phone]

## 2014-01-29 NOTE — Significant Event (Signed)
Rapid Response Event Note Called per floor RN for pt with worsening tachypnea, chills, elevated HR and BP. Admitted for acute encephalopathy, r/o seizure, r/o stroke.  Overview: Time Called:0540 Time Arrived : [id number] Event Type: Respiratory  Initial Focused Assessment: UPon my arrival pt found resting in bed, restless, shivering and ocasional moaning. Per RN her baseline neuro status since admission has been non verbal. Pt withdraws to pain x 4 extremeties. Rectal temp obtained prior to my arrival yielding 97.3,pt skin hot to touch. Bp elevated and HR 130s ST. RR 30-45 po2 95-98 on 2 LNC. Lungs congested, rhonchi worse on Right lower lobe.  Interventions: Repeat rectal temp obtained as pt appears feverish, yielding 99.4. [facility] PA Paged and updated on pt status, orders received for AM stat labs, ekg, cxr. Encouraged PA to come see pt, [facility] NP on campus K.Kirby  notified per PA of pt. K. Craige CottaKirby to bedside at 0620 to assess pt. ABG, EKG results relayed to NP. CXR reviewed for possible PNA. Pt to transfer to SD or overflow SD in ICU bed. Dr. Isidoro Donningai at bedside for morning rounds at 0700. Lactic acid added to stat labs. Pt awaiting tranfer, RN to provide report to SDU  Event Summary: Name of Physician Notified: Holley SinkSaha Osman PA at 0515    at    Outcome: Transferred (Comment)  Event End Time: 0715  Lolita RiegerWhite, Kinslea Frances Leigh Zeba Luby

## 2014-01-29 NOTE — Progress Notes (Signed)
ANTIBIOTIC CONSULT NOTE - INITIAL  Pharmacy Consult for acyclovir Indication: r/o encephalitis  Allergies  Allergen Reactions  . Oxycodone Other (See Comments)    Patient Measurements: Height: 5\' 7"  (170.2 cm) Weight: 92 lb 12.8 oz (42.094 kg) IBW/kg (Calculated) : 61.6 Adjusted Body Weight:   Vital Signs: Temp: 100 F (37.8 C) (10/23 1200) Temp Source: Oral (10/23 1200) BP: 174/81 mmHg (10/23 1400) Pulse Rate: 110 (10/23 1400) Intake/Output from previous day:   Intake/Output from this shift: Total I/O In: 760 [I.V.:460; IV Piggyback:300] Out: 1370 [Urine:1368; Stool:2]  Labs:  Recent Labs  01/27/14 1512 01/28/14 0137 01/29/14 0658  WBC 5.6 6.7 10.2  HGB 10.9* 9.2* 8.3*  PLT 383 295 283  CREATININE 3.07* 2.59* 2.67*   Estimated Creatinine Clearance: 17.9 ml/min (by C-G formula based on Cr of 2.67). No results found for this basename: VANCOTROUGH, Leodis BinetVANCOPEAK, VANCORANDOM, GENTTROUGH, GENTPEAK, GENTRANDOM, TOBRATROUGH, TOBRAPEAK, TOBRARND, AMIKACINPEAK, AMIKACINTROU, AMIKACIN,  in the last 72 hours   Microbiology: Recent Results (from the past 720 hour(s))  URINE CULTURE     Status: None   Collection Time    01/27/14  3:26 PM      Result Value Ref Range Status   Specimen Description URINE, CATHETERIZED   Final   Special Requests NONE   Final   Culture  Setup Time     Final   Value: 01/27/2014 22:22     [facility]formed at [facility]   Colony Count     Final   Value: NO GROWTH     [facility]formed at [facility]   Culture     Final   Value: NO GROWTH     Performed at [facility]   Report Status 01/28/2014 FINAL   Final  CLOSTRIDIUM DIFFICILE BY PCR     Status: None   Collection Time    01/29/14 11:10 AM      Result Value Ref Range Status   C difficile by pcr NEGATIVE  NEGATIVE Final    Medical History: History reviewed. No pertinent past medical history.  Medications:  Anti-infectives   Start     Dose/Rate Route Frequency Ordered  Stop   01/29/14 1600  acyclovir (ZOVIRAX) 420 mg in dextrose 5 % 100 mL IVPB     420 mg 108.4 mL/hr over 60 Minutes Intravenous Every 24 hours 01/29/14 1457     01/29/14 0800  cefTRIAXone (ROCEPHIN) 1 g in dextrose 5 % 50 mL IVPB  Status:  Discontinued     1 g 100 mL/hr over 30 Minutes Intravenous Every 24 hours 01/29/14 0707 01/29/14 0710   01/29/14 0800  vancomycin (VANCOCIN) 500 mg in sodium chloride 0.9 % 100 mL IVPB     500 mg 100 mL/hr over 60 Minutes Intravenous Every 48 hours 01/29/14 0719     01/29/14 0730  piperacillin-tazobactam (ZOSYN) IVPB 2.25 g     2.25 g 100 mL/hr over 30 Minutes Intravenous 4 times per day 01/29/14 0719     01/29/14 0715  azithromycin (ZITHROMAX) 500 mg in dextrose 5 % 250 mL IVPB  Status:  Discontinued     500 mg 250 mL/hr over 60 Minutes Intravenous Every 24 hours 01/29/14 0704 01/29/14 0710     Assessment: 43 yof presented to the hosptial with AMS. Initially started on vancomycin + zosyn for possible pneumonia. Now concern for encephalitis and ID adding acyclovir. Pt is poor renal function with CrCl<1820ml/min.  Vanc 10/23>> Zosyn 10/23>> Acyclovir 10/23>>  10/21 Urine - NEG  10/23 Blood - pending 10/23 Cdiff - NEG  Goal of Therapy:  Eradication of infection  Plan:  1. Acyclovir 420mg  IV Q24H 2. F/u renal fxn, C&S, clinical status   Rockne Dearinger, Drake Leachachel Lynn 01/29/2014,2:58 PM

## 2014-01-29 NOTE — Progress Notes (Signed)
ANTIBIOTIC CONSULT NOTE - INITIAL  Pharmacy Consult for Vancocin and Zosyn Indication: rule out pneumonia  Allergies  Allergen Reactions  . Oxycodone Other (See Comments)    Patient Measurements: Height: 5\' 7"  (170.2 cm) Weight: 92 lb 12.8 oz (42.094 kg) IBW/kg (Calculated) : 61.6  Vital Signs: Temp: 99.4 F (37.4 C) (10/23 0630) Temp Source: Rectal (10/23 0630) BP: 161/85 mmHg (10/23 0630) Pulse Rate: 135 (10/23 0630)  Labs:  Recent Labs  01/27/14 1512 01/28/14 0137  WBC 5.6 6.7  HGB 10.9* 9.2*  PLT 383 295  CREATININE 3.07* 2.59*   Estimated Creatinine Clearance: 18.4 ml/min (by C-G formula based on Cr of 2.59).   Microbiology: Recent Results (from the past 720 hour(s))  URINE CULTURE     Status: None   Collection Time    01/27/14  3:26 PM      Result Value Ref Range Status   Specimen Description URINE, CATHETERIZED   Final   Special Requests NONE   Final   Culture  Setup Time     Final   Value: 01/27/2014 22:22     Performed at [facility]   Colony Count     Final   Value: NO GROWTH     Performed at [facility]   Culture     Final   Value: NO GROWTH     Performed at [facility]   Report Status 01/28/2014 FINAL   Final    Medical History: History reviewed. No pertinent past medical history.  Medications:  Prescriptions prior to admission  Medication Sig Dispense Refill  . diphenhydramine-acetaminophen (TYLENOL PM) 25-500 MG TABS Take 1 tablet by mouth at bedtime as needed (for sleep).        Scheduled:  . aspirin  300 mg Rectal Daily  . enoxaparin (LOVENOX) injection  30 mg Subcutaneous Q24H  . insulin aspart  0-9 Units Subcutaneous TID WC  . levETIRAcetam  500 mg Intravenous Q12H  . metoprolol  2.5 mg Intravenous 4 times per day  . sodium chloride  3 mL Intravenous Q12H   Infusions:  . sodium chloride 100 mL/hr at 01/29/14 0018    Assessment: 43yo female admitted 10/21 for encephalopathy now has inc'd WOB,  tachypnea, tachycardia, and worsening responsiveness, CXR cocnerning for PNA, to begin IV ABX.   Goal of Therapy:  Vancomycin trough level 15-20 mcg/ml  Plan:  Will start vancomycin 500mg  IV Q48H and Zosyn 2.25g IV Q8H and monitor CBC, Cx, CrCl, levels prn.  Vernard GamblesVeronda Avya Flavell, PharmD, BCPS  01/29/2014,7:14 AM

## 2014-01-29 NOTE — Progress Notes (Signed)
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

## 2014-01-29 NOTE — Progress Notes (Signed)
NEURO HOSPITALIST PROGRESS NOTE   SUBJECTIVE:                                                                                                                        Remains encephalopathic. MRI brain showed dffuse cortical and subcortical signal abnormality bilaterally within the temporal and posterior frontal lobes. EEG unimpressive. Keppra 500 mg BID.  Placed on broad spectrum for aspiration pneumonia. Started on acyclovir. Cr 2.67   OBJECTIVE:                                                                                                                           Vital signs in last 24 hours: Temp:  [97.9 F (36.6 C)-100.8 F (38.2 C)] 100 F (37.8 C) (10/23 1600) Pulse Rate:  [97-135] 97 (10/23 1800) Resp:  [20-33] 22 (10/23 1800) BP: (139-188)/(81-108) 150/86 mmHg (10/23 1800) SpO2:  [92 %-100 %] 100 % (10/23 1800) Weight:  [42.094 kg (92 lb 12.8 oz)] 42.094 kg (92 lb 12.8 oz) (10/23 0458)  Intake/Output from previous day:   Intake/Output this shift: Total I/O In: 1310 [I.V.:860; IV Piggyback:450] Out: 3500 [[id number] [Urine:1683; Stool:102] Nutritional status: NPO  History reviewed. No pertinent past medical history.   Neurologic Exam:  [name] Pupils were equal and reacted normally to light.  Extraocular movements were full on right left lateral gaze and tracks my motions in the room  Blinks to threat bilaterally.  She appear to have moderate right lower facial weakness.  She was diffusely tremulous with slight increase in muscle tone throughout.Resistance to passive movement was symmetrical. Withdrawal movements to noxious stimuli were equal.  Responses to sensory testing unreliable.  Deep tendon reflexes were 1+ and symmetrical.  Plantar responses were flexor bilaterally  Lab Results: Lab Results  Component Value Date/Time   CHOL 182 01/28/2014  1:37 AM   Lipid  Panel  Recent Labs  01/28/14 0137  CHOL 182  TRIG 119  HDL 54  CHOLHDL 3.4  VLDL 24  LDLCALC 716104*    Studies/Results: Mr Brain Wo Contrast  01/28/2014   CLINICAL DATA:  Unresponsive. The patient had an episode with loss of consciousness. She has been aphasic  since that episode despite regaining who consciousness otherwise.  EXAM: MRI HEAD WITHOUT CONTRAST  TECHNIQUE: Multiplanar, multiecho pulse sequences of the brain and surrounding structures were obtained without intravenous contrast.  COMPARISON:  CT head without contrast 01/27/2014.  FINDINGS: Abnormal cortical and subcortical T2 hyperintensity is evident in the posterior insular cortex and along the sylvian fissure bilaterally. There is abnormal signal extending into the operculum bilaterally, left greater than right. This signal abnormality extends nearly to the vertex within the precentral sulcus. There is no significant associated restricted diffusion. No hemorrhage or mass lesion is present. There is no significant mass effect.  Flow is present in the major intracranial arteries. The globes and orbits are intact. Mild mucosal thickening is noted in the ethmoid air cells and sphenoid sinuses bilaterally.  The coronal T2 weighted images are somewhat degraded by patient motion. No definite hippocampal abnormality is evident.  IMPRESSION: 1. Diffuse cortical and subcortical signal abnormality bilaterally within the temporal and posterior frontal lobes as described. This is most likely a postictal phenomenon. There is no restricted diffusion to suggest acute or subacute ischemia. No mass lesion is evident. This could be related to elevated blood pressure similar to posterior reversible encephalopathy syndrome. The distribution is atypical. Recommend follow-up MRI of the brain without and with contrast in 2-3 weeks as the symptoms resolve. 2. Minimal sinus disease.   Electronically Signed   By: Gennette Pachris  Mattern M.D.   On: 01/28/2014 10:27   Dg  Chest Port 1 View  01/29/2014   CLINICAL DATA:  Shortness of breath, unresponsive  EXAM: PORTABLE CHEST - 1 VIEW  COMPARISON:  01/27/2014  FINDINGS: The cardiac shadow is stable. The lungs are well aerated bilaterally. Increasing density in the medial right lung base is noted consistent with atelectasis and/or early infiltrate. No pneumothorax or sizable effusion is seen.  IMPRESSION: Increased density in the right lung base related to atelectasis/early infiltrate.   Electronically Signed   By: Alcide CleverMark  Lukens M.D.   On: 01/29/2014 06:55    MEDICATIONS                                                                                                                        Scheduled: . acyclovir  420 mg Intravenous Q24H  . antiseptic oral rinse  7 mL Mouth Rinse q12n4p  . aspirin  300 mg Rectal Daily  . chlorhexidine  15 mL Mouth Rinse BID  . levETIRAcetam  500 mg Intravenous Q12H  . metoprolol  5 mg Intravenous 4 times per day  . mupirocin ointment  1 application Nasal BID  . piperacillin-tazobactam (ZOSYN)  IV  2.25 g Intravenous 4 times per day  . sodium chloride  3 mL Intravenous Q12H  . vancomycin  500 mg Intravenous Q48H    ASSESSMENT/PLAN:  43 y/o with new onset encephalopathy and probable seizure. Profound weight loss. 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 Paraneoplastic, autoimmune encephalitis, HIV encephalitis in the differential. LP not feasible today as Lovenox was stopped less than 72 hours ago. Will attempt tomorrow. Will follow up.  Wyatt Portela, MD [facility] [phone]  01/29/2014, 6:16 PM

## 2014-01-29 NOTE — Progress Notes (Signed)
[facility]....[facility]....faxed them for pt info.

## 2014-01-29 NOTE — Progress Notes (Signed)
OT Cancellation Note  Patient Details Name: Beryle BeamsKristy Ayler MRN: [id number] DOB: March 25, 1971   Cancelled Treatment:    Reason Eval/Treat Not Completed: Medical issues which prohibited therapy. Rapid response called for pt this AM and transferred to surgical ICU. OT will hold evaluation at this time until appropriate.   Nena JordanMiller, Sherley Leser M  Carney LivingLeeAnn Marie Krish Bailly, OTR/L Occupational Therapist [phone] (pager)  01/29/2014, 7:54 AM

## 2014-01-29 NOTE — Progress Notes (Addendum)
PT Cancellation Note  Patient Details Name: Beryle BeamsKristy Bechtel MRN: [id number] DOB: 22-Feb-1971   Cancelled Treatment:    Reason Eval/Treat Not Completed: Medical issues which prohibited therapy (Rapid response called due to high HR and respiratory rate.  Pt transfered to ICU this morning.  Will follow. )   Tamala SerUhlenberg, Suhana Wilner Kistler 01/29/2014, 9:09 AM [phone]

## 2014-01-29 NOTE — Progress Notes (Signed)
Rapid response paged to room due to patient exhibiting forceful breathing and increased respirations. Patient also shivering and showing discomfort. Oral and rectal temperature showed no fever. MD paged. EKG done. Chest xray completed and labs drawn. It was agreed upon [facility]. Monia PouchShakenna Jacaria Colburn, RN

## 2014-01-30 ENCOUNTER — Inpatient Hospital Stay (HOSPITAL_COMMUNITY): Payer: Medicaid Other

## 2014-01-30 DIAGNOSIS — R634 Abnormal weight loss: Secondary | ICD-10-CM

## 2014-01-30 DIAGNOSIS — J9690 Respiratory failure, unspecified, unspecified whether with hypoxia or hypercapnia: Secondary | ICD-10-CM

## 2014-01-30 LAB — GLUCOSE, CAPILLARY
Glucose-Capillary: 128 mg/dL — ABNORMAL HIGH (ref 70–99)
Glucose-Capillary: 114 mg/dL — ABNORMAL HIGH (ref 70–99)

## 2014-01-30 LAB — CBC
HCT: 22 % — ABNORMAL LOW (ref 36.0–46.0)
HEMOGLOBIN: 7.2 g/dL — AB (ref 12.0–15.0)
MCH: 24.7 pg — AB (ref 26.0–34.0)
MCHC: 32.7 g/dL (ref 30.0–36.0)
MCV: 75.6 fL — AB (ref 78.0–100.0)
Platelets: 209 10*3/uL (ref 150–400)
RBC: 2.91 MIL/uL — AB (ref 3.87–5.11)
RDW: 16.9 % — ABNORMAL HIGH (ref 11.5–15.5)
WBC: 8.9 10*3/uL (ref 4.0–10.5)

## 2014-01-30 LAB — CSF CELL COUNT WITH DIFFERENTIAL
Eosinophils, CSF: 0 % (ref 0–1)
Lymphs, CSF: 89 % — ABNORMAL HIGH (ref 40–80)
Monocyte-Macrophage-Spinal Fluid: 10 % — ABNORMAL LOW (ref 15–45)
RBC Count, CSF: 1479 /mm3 — ABNORMAL HIGH
Segmented Neutrophils-CSF: 1 % (ref 0–6)
TUBE #: 3
WBC, CSF: 78 /mm3 (ref 0–5)

## 2014-01-30 LAB — HEPATITIS PANEL, ACUTE
HCV AB: NEGATIVE
Hep A IgM: NONREACTIVE
Hep B C IgM: NONREACTIVE
Hepatitis B Surface Ag: NEGATIVE

## 2014-01-30 LAB — COMPREHENSIVE METABOLIC PANEL
ALT: 5 U/L (ref 0–35)
ANION GAP: 12 (ref 5–15)
AST: 11 U/L (ref 0–37)
Albumin: 2.1 g/dL — ABNORMAL LOW (ref 3.5–5.2)
Alkaline Phosphatase: 52 U/L (ref 39–117)
BILIRUBIN TOTAL: 0.7 mg/dL (ref 0.3–1.2)
BUN: 70 mg/dL — AB (ref 6–23)
CALCIUM: 9.1 mg/dL (ref 8.4–10.5)
CO2: 24 meq/L (ref 19–32)
CREATININE: 2.1 mg/dL — AB (ref 0.50–1.10)
Chloride: 116 mEq/L — ABNORMAL HIGH (ref 96–112)
GFR, EST AFRICAN AMERICAN: 32 mL/min — AB (ref >90)
GFR, EST NON AFRICAN AMERICAN: 28 mL/min — AB (ref >90)
Glucose, Bld: 132 mg/dL — ABNORMAL HIGH (ref 70–99)
Potassium: 3.1 mEq/L — ABNORMAL LOW (ref 3.7–5.3)
Sodium: 152 mEq/L — ABNORMAL HIGH (ref 137–147)
Total Protein: 6.5 g/dL (ref 6.0–8.3)

## 2014-01-30 LAB — HIV ANTIBODY (ROUTINE TESTING W REFLEX): HIV: REACTIVE — AB

## 2014-01-30 LAB — PROTEIN AND GLUCOSE, CSF
Glucose, CSF: 85 mg/dL — ABNORMAL HIGH (ref 43–76)
Total  Protein, CSF: 399 mg/dL — ABNORMAL HIGH (ref 15–45)

## 2014-01-30 LAB — RPR

## 2014-01-30 LAB — CRYPTOCOCCAL ANTIGEN, CSF: CRYPTO AG: NEGATIVE

## 2014-01-30 LAB — HIV 1/2 CONFIRMATION
HIV 2 AB: NEGATIVE
HIV-1 antibody: POSITIVE — AB

## 2014-01-30 LAB — GRAM STAIN

## 2014-01-30 LAB — ABO/RH: ABO/RH(D): A POS

## 2014-01-30 LAB — PREPARE RBC (CROSSMATCH)

## 2014-01-30 MED ORDER — DEXTROSE 5 % IV SOLN
10.0000 mg/kg | INTRAVENOUS | Status: DC
  Administered 2014-01-30 – 2014-01-31 (×2): 390 mg via INTRAVENOUS
  Filled 2014-01-30 (×4): qty 7.8

## 2014-01-30 MED ORDER — POTASSIUM CHLORIDE 10 MEQ/100ML IV SOLN
10.0000 meq | INTRAVENOUS | Status: AC
  Administered 2014-01-30 (×3): 10 meq via INTRAVENOUS
  Filled 2014-01-30 (×3): qty 100

## 2014-01-30 MED ORDER — POTASSIUM CL IN DEXTROSE 5% 20 MEQ/L IV SOLN
20.0000 meq | INTRAVENOUS | Status: DC
  Administered 2014-01-30 – 2014-02-02 (×4): 20 meq via INTRAVENOUS
  Filled 2014-01-30 (×9): qty 1000

## 2014-01-30 MED ORDER — POTASSIUM CHLORIDE 10 MEQ/100ML IV SOLN: 10.0000 meq | INTRAVENOUS | Status: DC

## 2014-01-30 MED ORDER — SODIUM CHLORIDE 0.9 % IV SOLN
Freq: Once | INTRAVENOUS | Status: AC
  Administered 2014-01-30: 17:00:00 via INTRAVENOUS

## 2014-01-30 NOTE — Progress Notes (Addendum)
NEURO HOSPITALIST PROGRESS NOTE   SUBJECTIVE:                                                                                                                        Neurologically unchanged. CXR showed increased density in the right lung base, on IV vancomycin and Zosyn. Keppra 500 mg BID.    OBJECTIVE:                                                                                                                           Vital signs in last 24 hours: Temp:  [97.9 F (36.6 C)-100.8 F (38.2 C)] 98.3 F (36.8 C) (10/24 1100) Pulse Rate:  [96-124] 109 (10/24 1000) Resp:  [21-30] 24 (10/24 1000) BP: (136-182)/(76-112) 165/84 mmHg (10/24 1000) SpO2:  [92 %-100 %] 100 % (10/24 1000) Weight:  [39.1 kg (86 lb 3.2 oz)] 39.1 kg (86 lb 3.2 oz) (10/24 0600)  Intake/Output from previous day: 10/23 0701 - 10/24 0700 In: 2810 [I.V.:2160; IV Piggyback:650] Out: 2555 [Urine:2303; Stool:252] Intake/Output this shift: Total I/O In: 688.3 [I.V.:438.3; IV Piggyback:250] Out: 40 [Urine:40] Nutritional status: NPO  History reviewed. No pertinent past medical history.  Neurologic Exam:  [name]  Pupils were equal and reacted normally to light.  Extraocular movements were full on right left lateral gaze and tracks my motions in the room  Blinks to threat bilaterally.  She appear to have moderate right lower facial weakness.  She was diffusely tremulous with slight increase in muscle tone throughout.Resistance to passive movement was symmetrical. Withdrawal movements to noxious stimuli were equal.  Responses to sensory testing unreliable.  Deep tendon reflexes were 1+ and symmetrical.  Plantar responses were flexor bilaterally   Lab Results: Lab Results  Component Value Date/Time   CHOL 182 01/28/2014  1:37 AM   Lipid Panel  Recent Labs  01/28/14 0137  CHOL 182  TRIG 119  HDL 54   CHOLHDL 3.4  VLDL 24  LDLCALC 161104*    Studies/Results: Dg Chest Port 1 View  01/29/2014   CLINICAL DATA:  Shortness of breath, unresponsive  EXAM: PORTABLE CHEST - 1 VIEW  COMPARISON:  01/27/2014  FINDINGS: The  cardiac shadow is stable. The lungs are well aerated bilaterally. Increasing density in the medial right lung base is noted consistent with atelectasis and/or early infiltrate. No pneumothorax or sizable effusion is seen.  IMPRESSION: Increased density in the right lung base related to atelectasis/early infiltrate.   Electronically Signed   By: Alcide CleverMark  Lukens M.D.   On: 01/29/2014 06:55    MEDICATIONS                                                                                                                        Scheduled: . sodium chloride   Intravenous Once  . antiseptic oral rinse  7 mL Mouth Rinse q12n4p  . aspirin  300 mg Rectal Daily  . chlorhexidine  15 mL Mouth Rinse BID  . levETIRAcetam  500 mg Intravenous Q12H  . metoprolol  5 mg Intravenous 4 times per day  . mupirocin ointment  1 application Nasal BID  . piperacillin-tazobactam (ZOSYN)  IV  2.25 g Intravenous 4 times per day  . potassium chloride  10 mEq Intravenous Q1 Hr x 3  . sodium chloride  3 mL Intravenous Q12H  . vancomycin  500 mg Intravenous Q48H    ASSESSMENT/PLAN:                                                                                                           43 y/o with new onset encephalopathy and probable seizure. Profound weight loss.  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  Paraneoplastic, autoimmune encephalitis, HIV encephalitis in the differential. Attempted LP at the bedside but unsuccessfully after several attempts. Will ask to be done under fluoro.   Wyatt Portelasvaldo Camilo, MD [facility] [phone]  01/30/2014, 11:55 AM  Procedure note: patient was placed in the lateral decubitus position. Under strict aseptic conditions, an 18 gauge needle was inserted  at the L4-L5 level. Restlessness of the patient impeded proper positioning and every attempt was faced by bony structures. After unsuccessful attempts, the needle was removed and a request for the procedure to be done under fluoro control was made.  Wyatt Portelasvaldo Camilo, MD

## 2014-01-30 NOTE — Progress Notes (Signed)
CRITICAL VALUE ALERT  Critical value received:  CSF White cell count 78  Date of notification:  01/30/14  Time of notification:  1900  Critical value read back: Yes  Nurse who received alert:  Doylene BodeEmily Ardelle Haliburton, RN  MD notified (1st page):  Rai  Time of first page:  [phone]  MD notified (2nd page):  Time of second page:  Responding MD: Isidoro Donningai  Time MD responded: [phone]

## 2014-01-30 NOTE — Progress Notes (Addendum)
Patient ID: Desiree BeamsKristy Polidori  female  WUJ:[id number]    DOB: Feb 06, 1971    DOA: 01/27/2014  PCP: No primary provider on file.  Addendum  Neuro unable to do LP after multiple unsuccessful attempts Requested LP under fluoro, called IR. Consult placed.    Rhaya Coale M.D. [facility] 01/30/2014, 2:32 PM  Pager: [phone]     Brief history of present illness  Patient is a 43 year old female with past medical history of hypertension, marijuana use who [facility]2014 after she was found unresponsive at home by a family member today. Patient was found shaking, lying on the floor, unable to speak. Patient's son at the bedside says he spoke with her over the phone at 4 PM one day prior to the admission and patient seemed to be fine. Neurology consulted. Patient was admitted for acute encephalopathy and dehydration. On 10/23, rapid response was called due to tachypnea and acute respiratory distress, chest x-ray consistent with right lung pneumonia possibly due to aspiration at the time of seizures. Patient transferred to step down unit.   Assessment/Plan: Principal Problem:   Acute respiratory failure likely due to aspiration pneumonia, SIRS - CXR showed increased density in the right lung base, on IV vancomycin and Zosyn - Urine strep antigen negative, urine Legionella antigen pending  Active Problems:   acute encephalopathy: Unclear etiology, was in her normal state of health (oriented x3, conversing) before this admission. - LP pending, CSF studies ordered, neuro will attempt today, Lovenox on hold  - EEG negative for acute epileptiform activity - Appreciate infectious disease recommendations, AFP, HIV, hepatitis panel, RPR pending   Seizures - Continue IV Keppra  High anion gap metabolic acidosis with acute renal insufficiency: Unclear etiology - Placed on IV fluids with bicarbonate, BMET pending    HTN (hypertension) with Tachycardia - Continue IV beta blocker,  labetalol as needed    Microcytic anemia: Hemoglobin currently stable   elevated troponins: Likely due to demand ischemia from seizures, SIRS  - Troponins trending down, 2-D echo showed EF of 55-60%, grade 1 diastolic dysfunction  - Continue IV Lopressor, aspirin pr, statin when taking oral  DVT Prophylaxis:  Code Status: FULL CODE  Family Communication: d/w patient's husband Desiree Hale in detail, he consented to spinal tap.   Disposition: Continue to monitor in SDU  Consultants:  Infectious disease, Dr. Ilsa IhaSnyder, Dr Ninetta LightsHatcher   Neurology, Dr. Cyril Mourningamillo  Procedures:  MRI brain    2-D echo  Antibiotics:  IV vancomycin    IV Zosyn   Subjective: Patient seen and examined, somewhat more alert today, afebrile this morning, O2 sats 99% on 3 L, however still not following commands  Objective: Weight change: -2.994 kg (-6 lb 9.6 oz)  Intake/Output Summary (Last 24 hours) at 01/30/14 0819 Last data filed at 01/30/14 0800  Gross per 24 hour  Intake   2910 ml  Output   2575 ml  Net    335 ml   Blood pressure 154/90, pulse 107, temperature 97.9 F (36.6 C), temperature source Axillary, resp. rate 23, height 5\' 7"  (1.702 m), weight 39.1 kg (86 lb 3.2 oz), SpO2 99.00%.  Physical Exam: General: not following verbal commands, confused, cachetic looking  CVS: S1-S2 clear, no murmur rubs or gallops Chest: Decreased breath sounds throughout Abdomen: soft nontender, nondistended, normal bowel sounds  Extremities: no cyanosis, clubbing or edema noted bilaterally Neuro:  not following commands   Lab Results: Basic Metabolic Panel:  Recent Labs Lab 01/28/14 0137 01/29/14 [id number]  NA 141 146  K 4.7 4.2  CL 107 113*  CO2 15* 13*  GLUCOSE 102* 83  BUN 76* 87*  CREATININE 2.59* 2.67*  CALCIUM 9.3 9.8   Liver Function Tests:  Recent Labs Lab 01/28/14 0137 01/29/14 0845  AST 14 16  ALT 6 6  ALKPHOS 77 69  BILITOT 0.3 0.4  PROT 7.9 7.5  ALBUMIN 2.7* 2.8*   No results  found for this basename: LIPASE, AMYLASE,  in the last 168 hours  Recent Labs Lab 01/27/14 1514 01/29/14 0845  AMMONIA 21 22   CBC:  Recent Labs Lab 01/27/14 1512 01/28/14 0137 01/29/14 0658  WBC 5.6 6.7 10.2  NEUTROABS 4.5  --   --   HGB 10.9* 9.2* 8.3*  HCT 32.8* 27.8* 25.7*  MCV 74.9* 76.8* 76.5*  PLT 383 295 283   Cardiac Enzymes:  Recent Labs Lab 01/27/14 1524  01/28/14 1500 01/28/14 1900 01/29/14 0105 01/29/14 0845  CKTOTAL 58  --   --   --   --  142  TROPONINI  --   < > 0.53* 0.52* 0.49*  --   < > = values in this interval not displayed. BNP: No components found with this basename: POCBNP,  CBG:  Recent Labs Lab 01/29/14 0651 01/29/14 0803 01/29/14 1209 01/29/14 1552 01/29/14 2202  GLUCAP 83 79 108* 142* 146*     Micro Results: Recent Results (from the past 240 hour(s))  URINE CULTURE     Status: None   Collection Time    01/27/14  3:26 PM      Result Value Ref Range Status   Specimen Description URINE, CATHETERIZED   Final   Special Requests NONE   Final   Culture  Setup Time     Final   Value: 01/27/2014 22:22     Performed at [facility]   Colony Count     Final   Value: NO GROWTH     [facility]formed at [facility]   Culture     Final   Value: NO GROWTH     Performed at [facility]   Report Status 01/28/2014 FINAL   Final  CLOSTRIDIUM DIFFICILE BY PCR     Status: None   Collection Time    01/29/14 11:10 AM      Result Value Ref Range Status   C difficile by pcr NEGATIVE  NEGATIVE Final  MRSA PCR SCREENING     Status: Abnormal   Collection Time    01/29/14  3:40 PM      Result Value Ref Range Status   MRSA by PCR POSITIVE (*) NEGATIVE Final   Comment:            The GeneXpert MRSA Assay (FDA     approved for NASAL specimens     only), is one component of a     comprehensive MRSA colonization     surveillance program. It is not     intended to diagnose MRSA     infection nor to guide or     monitor  treatment for     MRSA infections.     RESULT CALLED TO, READ BACK BY AND VERIFIED WITH:     Judge StallP. KEATON RN 17:50 01/29/14 ([email or web address])    Studies/Results: Ct Head Wo Contrast  01/27/2014   CLINICAL DATA:  Found unresponsive.  Altered mental status.  EXAM: CT HEAD WITHOUT CONTRAST  CT CERVICAL SPINE WITHOUT CONTRAST  TECHNIQUE: Multidetector CT imaging of  the head and cervical spine was performed following the standard protocol without intravenous contrast. Multiplanar CT image reconstructions of the cervical spine were also generated.  COMPARISON:  None.  FINDINGS: CT HEAD FINDINGS  The ventricles are normal in size and configuration. No extra-axial fluid collections are identified. The gray-white differentiation is normal. No CT findings for acute intracranial process such as hemorrhage or infarction. No mass lesions. The brainstem and cerebellum are grossly normal.  The bony structures are intact. The paranasal sinuses and mastoid air cells are clear. The globes are intact.  CT CERVICAL SPINE FINDINGS  Normal alignment of the cervical vertebral bodies. Disc spaces and vertebral bodies are maintained. No acute fracture or abnormal prevertebral soft tissue swelling. The facets are normally aligned. The skullbase C1 and C1-2 articulations are maintained. The dens is normal. No large disc protrusions, spinal or foraminal stenosis. The lung apices are clear. Emphysematous changes are noted. There is some air in the supraclavicular fossa possibly due to a laceration. No pneumothorax is identified.  IMPRESSION: No acute intracranial findings or skull fracture.  Normal CT examination of the cervical spine. No cervical spine fracture.   Electronically Signed   By: Loralie Champagne M.D.   On: 01/27/2014 17:27   Ct Cervical Spine Wo Contrast  01/27/2014   CLINICAL DATA:  Found unresponsive.  Altered mental status.  EXAM: CT HEAD WITHOUT CONTRAST  CT CERVICAL SPINE WITHOUT CONTRAST  TECHNIQUE: Multidetector CT imaging  of the head and cervical spine was performed following the standard protocol without intravenous contrast. Multiplanar CT image reconstructions of the cervical spine were also generated.  COMPARISON:  None.  FINDINGS: CT HEAD FINDINGS  The ventricles are normal in size and configuration. No extra-axial fluid collections are identified. The gray-white differentiation is normal. No CT findings for acute intracranial process such as hemorrhage or infarction. No mass lesions. The brainstem and cerebellum are grossly normal.  The bony structures are intact. The paranasal sinuses and mastoid air cells are clear. The globes are intact.  CT CERVICAL SPINE FINDINGS  Normal alignment of the cervical vertebral bodies. Disc spaces and vertebral bodies are maintained. No acute fracture or abnormal prevertebral soft tissue swelling. The facets are normally aligned. The skullbase C1 and C1-2 articulations are maintained. The dens is normal. No large disc protrusions, spinal or foraminal stenosis. The lung apices are clear. Emphysematous changes are noted. There is some air in the supraclavicular fossa possibly due to a laceration. No pneumothorax is identified.  IMPRESSION: No acute intracranial findings or skull fracture.  Normal CT examination of the cervical spine. No cervical spine fracture.   Electronically Signed   By: Loralie Champagne M.D.   On: 01/27/2014 17:27   Mr Brain Wo Contrast  01/28/2014   CLINICAL DATA:  Unresponsive. The patient had an episode with loss of consciousness. She has been aphasic since that episode despite regaining who consciousness otherwise.  EXAM: MRI HEAD WITHOUT CONTRAST  TECHNIQUE: Multiplanar, multiecho pulse sequences of the brain and surrounding structures were obtained without intravenous contrast.  COMPARISON:  CT head without contrast 01/27/2014.  FINDINGS: Abnormal cortical and subcortical T2 hyperintensity is evident in the posterior insular cortex and along the sylvian fissure  bilaterally. There is abnormal signal extending into the operculum bilaterally, left greater than right. This signal abnormality extends nearly to the vertex within the precentral sulcus. There is no significant associated restricted diffusion. No hemorrhage or mass lesion is present. There is no significant mass effect.  Flow is present  in the major intracranial arteries. The globes and orbits are intact. Mild mucosal thickening is noted in the ethmoid air cells and sphenoid sinuses bilaterally.  The coronal T2 weighted images are somewhat degraded by patient motion. No definite hippocampal abnormality is evident.  IMPRESSION: 1. Diffuse cortical and subcortical signal abnormality bilaterally within the temporal and posterior frontal lobes as described. This is most likely a postictal phenomenon. There is no restricted diffusion to suggest acute or subacute ischemia. No mass lesion is evident. This could be related to elevated blood pressure similar to posterior reversible encephalopathy syndrome. The distribution is atypical. Recommend follow-up MRI of the brain without and with contrast in 2-3 weeks as the symptoms resolve. 2. Minimal sinus disease.   Electronically Signed   By: Gennette Pac M.D.   On: 01/28/2014 10:27   Dg Chest Port 1 View  01/29/2014   CLINICAL DATA:  Shortness of breath, unresponsive  EXAM: PORTABLE CHEST - 1 VIEW  COMPARISON:  01/27/2014  FINDINGS: The cardiac shadow is stable. The lungs are well aerated bilaterally. Increasing density in the medial right lung base is noted consistent with atelectasis and/or early infiltrate. No pneumothorax or sizable effusion is seen.  IMPRESSION: Increased density in the right lung base related to atelectasis/early infiltrate.   Electronically Signed   By: Alcide Clever M.D.   On: 01/29/2014 06:55   Dg Chest Portable 1 View  01/27/2014   CLINICAL DATA:  Altered mental status  EXAM: PORTABLE CHEST - 1 VIEW  COMPARISON:  None.  FINDINGS: The  patient is rotated. There is no focal parenchymal opacity, pleural effusion, or pneumothorax. The heart and mediastinal contours are unremarkable.  The osseous structures are unremarkable.  IMPRESSION: No active disease.   Electronically Signed   By: Elige Ko   On: 01/27/2014 16:17    Medications: Scheduled Meds: . antiseptic oral rinse  7 mL Mouth Rinse q12n4p  . aspirin  300 mg Rectal Daily  . chlorhexidine  15 mL Mouth Rinse BID  . levETIRAcetam  500 mg Intravenous Q12H  . metoprolol  5 mg Intravenous 4 times per day  . mupirocin ointment  1 application Nasal BID  . piperacillin-tazobactam (ZOSYN)  IV  2.25 g Intravenous 4 times per day  . sodium chloride  3 mL Intravenous Q12H  . vancomycin  500 mg Intravenous Q48H      LOS: 3 days   Lasonia Casino M.D. [facility] 01/30/2014, 8:19 AM Pager: [phone]  If 7PM-7AM, please contact night-coverage [email or web address] Password [email or web address]

## 2014-01-30 NOTE — Progress Notes (Signed)
Spoke with Dr. Isidoro Donningai to confirm level of care. Patient remains step-down. Will request step-down bed for transfer.

## 2014-01-30 NOTE — Progress Notes (Signed)
ANTIBIOTIC CONSULT NOTE - INITIAL  Pharmacy Consult for acyclovir Indication: r/o encephalitis  Allergies  Allergen Reactions  . Oxycodone Other (See Comments)    Patient Measurements: Height: 5\' 7"  (170.2 cm) Weight: 86 lb 3.2 oz (39.1 kg) IBW/kg (Calculated) : 61.6 Adjusted Body Weight:   Vital Signs: Temp: 98.5 F (36.9 C) (10/24 2000) Temp Source: Axillary (10/24 2000) BP: 152/84 mmHg (10/24 2000) Pulse Rate: 99 (10/24 2000) Intake/Output from previous day: 10/23 0701 - 10/24 0700 In: 2810 [I.V.:2160; IV Piggyback:650] Out: 2555 [Urine:2303; Stool:252] Intake/Output from this shift:    Labs:  Recent Labs  01/28/14 0137 01/29/14 0658 01/30/14 0850  WBC 6.7 10.2 8.9  HGB 9.2* 8.3* 7.2*  PLT 295 283 209  CREATININE 2.59* 2.67* 2.10*   Estimated Creatinine Clearance: 21.1 ml/min (by C-G formula based on Cr of 2.1). No results found for this basename: VANCOTROUGH, Leodis BinetVANCOPEAK, VANCORANDOM, GENTTROUGH, GENTPEAK, GENTRANDOM, TOBRATROUGH, TOBRAPEAK, TOBRARND, AMIKACINPEAK, AMIKACINTROU, AMIKACIN,  in the last 72 hours   Microbiology: Recent Results (from the past 720 hour(s))  URINE CULTURE     Status: None   Collection Time    01/27/14  3:26 PM      Result Value Ref Range Status   Specimen Description URINE, CATHETERIZED   Final   Special Requests NONE   Final   Culture  Setup Time     Final   Value: 01/27/2014 22:22     Performed at [facility]   Colony Count     Final   Value: NO GROWTH     Performed at [facility]   Culture     Final   Value: NO GROWTH     Performed at [facility]   Report Status 01/28/2014 FINAL   Final  CULTURE, BLOOD (ROUTINE X 2)     Status: None   Collection Time    01/29/14  9:19 AM      Result Value Ref Range Status   Specimen Description BLOOD LEFT ARM   Final   Special Requests BOTTLES DRAWN AEROBIC AND ANAEROBIC 10CC   Final   Culture  Setup Time     Final   Value: 01/29/2014 15:04     Performed  at [facility]   Culture     Final   Value:        BLOOD CULTURE RECEIVED NO GROWTH TO DATE CULTURE WILL BE HELD FOR 5 DAYS BEFORE ISSUING A FINAL NEGATIVE REPORT     Performed at [facility]   Report Status PENDING   Incomplete  CULTURE, BLOOD (ROUTINE X 2)     Status: None   Collection Time    01/29/14  9:30 AM      Result Value Ref Range Status   Specimen Description BLOOD LEFT HAND   Final   Special Requests BOTTLES DRAWN AEROBIC AND ANAEROBIC 5CC   Final   Culture  Setup Time     Final   Value: 01/29/2014 15:04     Performed at [facility]   Culture     Final   Value:        BLOOD CULTURE RECEIVED NO GROWTH TO DATE CULTURE WILL BE HELD FOR 5 DAYS BEFORE ISSUING A FINAL NEGATIVE REPORT     Performed at [facility]   Report Status PENDING   Incomplete  CLOSTRIDIUM DIFFICILE BY PCR     Status: None   Collection Time    01/29/14 11:10 AM  Result Value Ref Range Status   C difficile by pcr NEGATIVE  NEGATIVE Final  MRSA PCR SCREENING     Status: Abnormal   Collection Time    01/29/14  3:40 PM      Result Value Ref Range Status   MRSA by PCR POSITIVE (*) NEGATIVE Final   Comment:            The GeneXpert MRSA Assay (FDA     approved for NASAL specimens     only), is one component of a     comprehensive MRSA colonization     surveillance program. It is not     intended to diagnose MRSA     infection nor to guide or     monitor treatment for     MRSA infections.     RESULT CALLED TO, READ BACK BY AND VERIFIED WITH:     Judge StallP. KEATON RN 17:50 01/29/14 ([email or web address])  GRAM STAIN     Status: None   Collection Time    01/30/14  5:01 PM      Result Value Ref Range Status   Specimen Description CSF   Final   Special Requests NONE   Final   Gram Stain     Final   Value: CYTOSPIN SLIDE     WBC PRESENT, PREDOMINANTLY MONONUCLEAR     NO ORGANISMS SEEN   Report Status 01/30/2014 FINAL   Final    Medical History: History reviewed. No pertinent  past medical history.  Medications:  Anti-infectives   Start     Dose/Rate Route Frequency Ordered Stop   01/30/14 2100  acyclovir (ZOVIRAX) 390 mg in dextrose 5 % 100 mL IVPB     10 mg/kg  39.1 kg 107.8 mL/hr over 60 Minutes Intravenous Every 24 hours 01/30/14 2018     01/29/14 1600  acyclovir (ZOVIRAX) 420 mg in dextrose 5 % 100 mL IVPB  Status:  Discontinued     420 mg 108.4 mL/hr over 60 Minutes Intravenous Every 24 hours 01/29/14 1457 01/29/14 2015   01/29/14 0800  cefTRIAXone (ROCEPHIN) 1 g in dextrose 5 % 50 mL IVPB  Status:  Discontinued     1 g 100 mL/hr over 30 Minutes Intravenous Every 24 hours 01/29/14 0707 01/29/14 0710   01/29/14 0800  vancomycin (VANCOCIN) 500 mg in sodium chloride 0.9 % 100 mL IVPB     500 mg 100 mL/hr over 60 Minutes Intravenous Every 48 hours 01/29/14 0719     01/29/14 0730  piperacillin-tazobactam (ZOSYN) IVPB 2.25 g     2.25 g 100 mL/hr over 30 Minutes Intravenous 4 times per day 01/29/14 0719     01/29/14 0715  azithromycin (ZITHROMAX) 500 mg in dextrose 5 % 250 mL IVPB  Status:  Discontinued     500 mg 250 mL/hr over 60 Minutes Intravenous Every 24 hours 01/29/14 0704 01/29/14 0710     Assessment: 43 yof presented to the hosptial with AMS. Initially started on vancomycin + zosyn for possible pneumonia. She got 1 dose of acyclovir before ID recommended to hold until LP is done. LP is done tonight. Elevated WBC, protein.   Vanc 10/23>> Zosyn 10/23>> Acyclovir 10/23>>  10/21 Urine - NEG 10/23 Blood - pending 10/23 Cdiff - NEG 10/24 CSF>>  Goal of Therapy:  Eradication of infection  Plan:   Acyclovir 390mg  IV Q24H F/u renal fxn, C&S, clinical status   Ulyses SouthwardMinh Kashonda Sarkisyan, PharmD Pager: [phone] 01/30/2014 8:19 PM

## 2014-01-30 NOTE — Progress Notes (Signed)
INFECTIOUS DISEASE PROGRESS NOTE  ID: Desiree Hale is a 43 y.o. female with  Principal Problem:   Acute respiratory failure Active Problems:   Altered mental state   HTN (hypertension)   Tachycardia   Metabolic acidosis   Hyperglycemia   Acute renal failure   Microcytic anemia   Hypercalcemia   Aspiration pneumonia   Seizures  Subjective: moaining  Abtx:  Anti-infectives   Start     Dose/Rate Route Frequency Ordered Stop   01/29/14 1600  acyclovir (ZOVIRAX) 420 mg in dextrose 5 % 100 mL IVPB  Status:  Discontinued     420 mg 108.4 mL/hr over 60 Minutes Intravenous Every 24 hours 01/29/14 1457 01/29/14 2015   01/29/14 0800  cefTRIAXone (ROCEPHIN) 1 g in dextrose 5 % 50 mL IVPB  Status:  Discontinued     1 g 100 mL/hr over 30 Minutes Intravenous Every 24 hours 01/29/14 0707 01/29/14 0710   01/29/14 0800  vancomycin (VANCOCIN) 500 mg in sodium chloride 0.9 % 100 mL IVPB     500 mg 100 mL/hr over 60 Minutes Intravenous Every 48 hours 01/29/14 0719     01/29/14 0730  piperacillin-tazobactam (ZOSYN) IVPB 2.25 g     2.25 g 100 mL/hr over 30 Minutes Intravenous 4 times per day 01/29/14 0719     01/29/14 0715  azithromycin (ZITHROMAX) 500 mg in dextrose 5 % 250 mL IVPB  Status:  Discontinued     500 mg 250 mL/hr over 60 Minutes Intravenous Every 24 hours 01/29/14 0704 01/29/14 0710      Medications:  Scheduled: . antiseptic oral rinse  7 mL Mouth Rinse q12n4p  . aspirin  300 mg Rectal Daily  . chlorhexidine  15 mL Mouth Rinse BID  . levETIRAcetam  500 mg Intravenous Q12H  . metoprolol  5 mg Intravenous 4 times per day  . mupirocin ointment  1 application Nasal BID  . piperacillin-tazobactam (ZOSYN)  IV  2.25 g Intravenous 4 times per day  . sodium chloride  3 mL Intravenous Q12H  . vancomycin  500 mg Intravenous Q48H    Objective: Vital signs in last 24 hours: Temp:  [98.6 F (37 C)-100.8 F (38.2 C)] 100.1 F (37.8 C) (10/24 0400) Pulse Rate:  [96-127] 100  (10/24 0700) Resp:  [21-33] 21 (10/24 0700) BP: (136-188)/(76-112) 155/108 mmHg (10/24 0700) SpO2:  [92 %-100 %] 100 % (10/24 0700) Weight:  [39.1 kg (86 lb 3.2 oz)] 39.1 kg (86 lb 3.2 oz) (10/24 0600)   General appearance: does not follow commands or answer questions Eyes: will not open eyes to exam Throat: will not open mouth to exam Resp: rhonchi bilaterally and mild-mod Cardio: tachycardia GI: normal findings: bowel sounds normal and soft, non-tender and bruit  Lab Results  Recent Labs  01/28/14 0137 01/29/14 0658  WBC 6.7 10.2  HGB 9.2* 8.3*  HCT 27.8* 25.7*  NA 141 146  K 4.7 4.2  CL 107 113*  CO2 15* 13*  BUN 76* 87*  CREATININE 2.59* 2.67*   Liver Panel  Recent Labs  01/28/14 0137 01/29/14 0845  PROT 7.9 7.5  ALBUMIN 2.7* 2.8*  AST 14 16  ALT 6 6  ALKPHOS 77 69  BILITOT 0.3 0.4  BILIDIR  --  <0.2  IBILI  --  NOT CALCULATED   Sedimentation Rate No results found for this basename: ESRSEDRATE,  in the last 72 hours C-Reactive Protein No results found for this basename: CRP,  in the last 72 hours  Microbiology: Recent Results (from the past 240 hour(s))  URINE CULTURE     Status: None   Collection Time    01/27/14  3:26 PM      Result Value Ref Range Status   Specimen Description URINE, CATHETERIZED   Final   Special Requests NONE   Final   Culture  Setup Time     Final   Value: 01/27/2014 22:22     [facility]formed [facility]   Colony Count     Final   Value: NO GROWTH     [facility]   Culture     Final   Value: NO GROWTH     Performed at [facility]   Report Status 01/28/2014 FINAL   Final  CLOSTRIDIUM DIFFICILE BY PCR     Status: None   Collection Time    01/29/14 11:10 AM      Result Value Ref Range Status   C difficile by pcr NEGATIVE  NEGATIVE Final  MRSA PCR SCREENING     Status: Abnormal   Collection Time    01/29/14  3:40 PM      Result Value Ref Range Status   MRSA by PCR POSITIVE (*)  NEGATIVE Final   Comment:            The GeneXpert MRSA Assay (FDA     approved for NASAL specimens     only), is one component of a     comprehensive MRSA colonization     surveillance program. It is not     intended to diagnose MRSA     infection nor to guide or     monitor treatment for     MRSA infections.     RESULT CALLED TO, READ BACK BY AND VERIFIED WITH:     Judge StallP. KEATON RN 17:50 01/29/14 ([email or web address])    Studies/Results: Dg Chest Port 1 View  01/29/2014   CLINICAL DATA:  Shortness of breath, unresponsive  EXAM: PORTABLE CHEST - 1 VIEW  COMPARISON:  01/27/2014  FINDINGS: The cardiac shadow is stable. The lungs are well aerated bilaterally. Increasing density in the medial right lung base is noted consistent with atelectasis and/or early infiltrate. No pneumothorax or sizable effusion is seen.  IMPRESSION: Increased density in the right lung base related to atelectasis/early infiltrate.   Electronically Signed   By: Alcide CleverMark  Lukens M.D.   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  Await LP, needs CSF crypto, HSV Serum crypto can be (-) with CNS disease Will send BCx MAI, hepatitis panel          Johny SaxJeffrey Wallie Lagrand Infectious Diseases (pager) [phone] [email or web address] 01/30/2014, 7:54 AM  LOS: 3 days

## 2014-01-30 NOTE — Procedures (Signed)
Fluoroscopic guided lumbar puncture performed at L3-L4.  Opening pressure 17 cmH2O.  CSF slightly yellow in all samples (initially slightly bloody despite non-traumatic tap).  A total of 10 mL of CSF sent to lab for analysis.  No complications.

## 2014-01-31 ENCOUNTER — Inpatient Hospital Stay (HOSPITAL_COMMUNITY): Payer: Medicaid Other

## 2014-01-31 DIAGNOSIS — E860 Dehydration: Secondary | ICD-10-CM

## 2014-01-31 DIAGNOSIS — B20 Human immunodeficiency virus [HIV] disease: Secondary | ICD-10-CM

## 2014-01-31 LAB — GLUCOSE, CAPILLARY
GLUCOSE-CAPILLARY: 103 mg/dL — AB (ref 70–99)
Glucose-Capillary: 110 mg/dL — ABNORMAL HIGH (ref 70–99)
Glucose-Capillary: 96 mg/dL (ref 70–99)
Glucose-Capillary: 105 mg/dL — ABNORMAL HIGH (ref 70–99)

## 2014-01-31 LAB — HEMOGLOBIN AND HEMATOCRIT, BLOOD
HCT: 29.9 % — ABNORMAL LOW (ref 36.0–46.0)
HEMOGLOBIN: 10 g/dL — AB (ref 12.0–15.0)

## 2014-01-31 LAB — LEGIONELLA ANTIGEN, URINE

## 2014-01-31 LAB — TYPE AND SCREEN
ABO/RH(D): A POS
Antibody Screen: NEGATIVE
Unit division: 0
Unit division: 0

## 2014-01-31 LAB — BASIC METABOLIC PANEL
Anion gap: 14 (ref 5–15)
BUN: 55 mg/dL — ABNORMAL HIGH (ref 6–23)
CO2: 22 mEq/L (ref 19–32)
Calcium: 9.2 mg/dL (ref 8.4–10.5)
Chloride: 115 mEq/L — ABNORMAL HIGH (ref 96–112)
Creatinine, Ser: 1.64 mg/dL — ABNORMAL HIGH (ref 0.50–1.10)
GFR, EST AFRICAN AMERICAN: 43 mL/min — AB (ref >90)
GFR, EST NON AFRICAN AMERICAN: 37 mL/min — AB (ref >90)
Glucose, Bld: 111 mg/dL — ABNORMAL HIGH (ref 70–99)
Potassium: 3.2 mEq/L — ABNORMAL LOW (ref 3.7–5.3)
SODIUM: 151 meq/L — AB (ref 137–147)

## 2014-01-31 LAB — CBC
HCT: 29.6 % — ABNORMAL LOW (ref 36.0–46.0)
Hemoglobin: 10 g/dL — ABNORMAL LOW (ref 12.0–15.0)
MCH: 27 pg (ref 26.0–34.0)
MCHC: 33.8 g/dL (ref 30.0–36.0)
MCV: 79.8 fL (ref 78.0–100.0)
PLATELETS: 182 10*3/uL (ref 150–400)
RBC: 3.71 MIL/uL — AB (ref 3.87–5.11)
RDW: 16.4 % — AB (ref 11.5–15.5)
WBC: 9.3 10*3/uL (ref 4.0–10.5)

## 2014-01-31 LAB — PROCALCITONIN: Procalcitonin: 1.26 ng/mL

## 2014-01-31 LAB — HIV-1 RNA ULTRAQUANT REFLEX TO GENTYP+
HIV 1 RNA Quant: 251358 copies/mL — ABNORMAL HIGH (ref <20)
HIV-1 RNA Quant, Log: 5.4 {Log} — ABNORMAL HIGH (ref <1.30)

## 2014-01-31 MED ORDER — CLONIDINE HCL 0.2 MG/24HR TD PTWK
0.2000 mg | MEDICATED_PATCH | TRANSDERMAL | Status: DC
  Administered 2014-01-31: 0.2 mg via TRANSDERMAL
  Filled 2014-01-31: qty 1

## 2014-01-31 MED ORDER — POTASSIUM CHLORIDE 10 MEQ/100ML IV SOLN
10.0000 meq | INTRAVENOUS | Status: AC
  Administered 2014-01-31 (×3): 10 meq via INTRAVENOUS
  Filled 2014-01-31 (×3): qty 100

## 2014-01-31 MED ORDER — PIPERACILLIN-TAZOBACTAM 3.375 G IVPB
3.3750 g | Freq: Three times a day (TID) | INTRAVENOUS | Status: DC
  Administered 2014-01-31 – 2014-02-03 (×10): 3.375 g via INTRAVENOUS
  Filled 2014-01-31 (×13): qty 50

## 2014-01-31 MED ORDER — VANCOMYCIN HCL 500 MG IV SOLR
500.0000 mg | INTRAVENOUS | Status: DC
  Filled 2014-01-31: qty 500

## 2014-01-31 NOTE — Progress Notes (Signed)
NEURO HOSPITALIST PROGRESS NOTE   SUBJECTIVE:                                                                                                                        Nursing staff said that she is more responsive today. No further seizures noted. CSF WBC 78, RBC 1479, protein 399, glucose 85. Gram stain negative. Cryptococcal antigen negative. CD4 count low, HIV reactive, HIV viral load pending. Started on acyclovir. Keppra 500 mg BID.   OBJECTIVE:                                                                                                                           Vital signs in last 24 hours: Temp:  [97.5 F (36.4 C)-100.9 F (38.3 C)] 98.4 F (36.9 C) (10/25 1250) Pulse Rate:  [89-109] 109 (10/25 1700) Resp:  [19-33] 27 (10/25 1700) BP: (152-200)/(81-118) 191/100 mmHg (10/25 1700) SpO2:  [96 %-100 %] 100 % (10/25 1700) Weight:  [39.3 kg (86 lb 10.3 oz)] 39.3 kg (86 lb 10.3 oz) (10/25 0500)  Intake/Output from previous day: 10/24 0701 - 10/25 0700 In: 3537.8 [I.V.:2125; Blood:705; IV Piggyback:707.8] Out: 1680 [Urine:1280; Stool:400] Intake/Output this shift: Total I/O In: 1350 [I.V.:800; IV Piggyback:550] Out: 415 [Urine:415] Nutritional status: NPO  History reviewed. No pertinent past medical history.   Neurologic Exam:  Patient is awake but is non verbal and does not follow commands.  Pupils were equal and reacted normally to light.  Extraocular movements were full on right left lateral gaze and tracks my motions in the room  Blinks to threat bilaterally.  She appear to have moderate right lower facial weakness.  She was diffusely tremulous with slight increase in muscle tone throughout.Resistance to passive movement was symmetrical. Withdrawal movements to noxious stimuli were equal.  Responses to sensory testing unreliable.  Deep tendon reflexes were 1+ and symmetrical.  Plantar responses were flexor bilaterally    Lab  Results: Lab Results  Component Value Date/Time   CHOL 182 01/28/2014  1:37 AM   Lipid Panel No results found for this basename: CHOL, TRIG, HDL, CHOLHDL, VLDL, LDLCALC,  in the last 72 hours  Studies/Results: Dg Fluoro Guide Lumbar Puncture  01/30/2014   Trudie Reedaniel Entrikin, MD  01/30/2014  4:33 PM Fluoroscopic guided lumbar puncture performed at L3-L4.  Opening  pressure 17 cmH2O.  CSF slightly yellow in all samples (initially  slightly bloody despite non-traumatic tap).  A total of 10 mL of  CSF sent to lab for analysis.  No complications.     MEDICATIONS                                                                                                                        Scheduled: . acyclovir  10 mg/kg Intravenous Q24H  . antiseptic oral rinse  7 mL Mouth Rinse q12n4p  . aspirin  300 mg Rectal Daily  . chlorhexidine  15 mL Mouth Rinse BID  . cloNIDine  0.2 mg Transdermal Weekly  . levETIRAcetam  500 mg Intravenous Q12H  . metoprolol  5 mg Intravenous 4 times per day  . mupirocin ointment  1 application Nasal BID  . piperacillin-tazobactam (ZOSYN)  IV  3.375 g Intravenous Q8H  . sodium chloride  3 mL Intravenous Q12H  . [START ON 02/01/2014] vancomycin  500 mg Intravenous Q24H    ASSESSMENT/PLAN:                                                                                                            43 y/o with new onset encephalopathy and probable seizure. Profound weight loss.  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  Paraneoplastic, autoimmune encephalitis, HIV encephalitis in the differential.  CSF as described above: on acyclovir pending HSV CSF. 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  Wyatt Portelasvaldo Naveena Eyman, MD [facility] [phone]  01/31/2014, 6:31 PM

## 2014-01-31 NOTE — Progress Notes (Signed)
INFECTIOUS DISEASE PROGRESS NOTE  ID: Desiree BeamsKristy Hale is a 43 y.o. female with  Principal Problem:   Acute respiratory failure Active Problems:   Altered mental state   HTN (hypertension)   Tachycardia   Metabolic acidosis   Hyperglycemia   Acute renal failure   Microcytic anemia   Hypercalcemia   Aspiration pneumonia   Seizures  Subjective: Moans, does not respond to voice  Abtx:  Anti-infectives   Start     Dose/Rate Route Frequency Ordered Stop   02/01/14 0823  vancomycin (VANCOCIN) 500 mg in sodium chloride 0.9 % 100 mL IVPB     500 mg 100 mL/hr over 60 Minutes Intravenous Every 24 hours 01/31/14 1029     01/31/14 1200  piperacillin-tazobactam (ZOSYN) IVPB 3.375 g     3.375 g 12.5 mL/hr over 240 Minutes Intravenous Every 8 hours 01/31/14 1029     01/30/14 2100  acyclovir (ZOVIRAX) 390 mg in dextrose 5 % 100 mL IVPB     10 mg/kg  39.1 kg 107.8 mL/hr over 60 Minutes Intravenous Every 24 hours 01/30/14 2018     01/29/14 1600  acyclovir (ZOVIRAX) 420 mg in dextrose 5 % 100 mL IVPB  Status:  Discontinued     420 mg 108.4 mL/hr over 60 Minutes Intravenous Every 24 hours 01/29/14 1457 01/29/14 2015   01/29/14 0800  cefTRIAXone (ROCEPHIN) 1 g in dextrose 5 % 50 mL IVPB  Status:  Discontinued     1 g 100 mL/hr over 30 Minutes Intravenous Every 24 hours 01/29/14 0707 01/29/14 0710   01/29/14 0800  vancomycin (VANCOCIN) 500 mg in sodium chloride 0.9 % 100 mL IVPB  Status:  Discontinued     500 mg 100 mL/hr over 60 Minutes Intravenous Every 48 hours 01/29/14 0719 01/31/14 1029   01/29/14 0730  piperacillin-tazobactam (ZOSYN) IVPB 2.25 g  Status:  Discontinued     2.25 g 100 mL/hr over 30 Minutes Intravenous 4 times per day 01/29/14 0719 01/31/14 1028   01/29/14 0715  azithromycin (ZITHROMAX) 500 mg in dextrose 5 % 250 mL IVPB  Status:  Discontinued     500 mg 250 mL/hr over 60 Minutes Intravenous Every 24 hours 01/29/14 0704 01/29/14 0710      Medications:  Scheduled: .  acyclovir  10 mg/kg Intravenous Q24H  . antiseptic oral rinse  7 mL Mouth Rinse q12n4p  . aspirin  300 mg Rectal Daily  . chlorhexidine  15 mL Mouth Rinse BID  . cloNIDine  0.2 mg Transdermal Weekly  . levETIRAcetam  500 mg Intravenous Q12H  . metoprolol  5 mg Intravenous 4 times per day  . mupirocin ointment  1 application Nasal BID  . piperacillin-tazobactam (ZOSYN)  IV  3.375 g Intravenous Q8H  . potassium chloride  10 mEq Intravenous Q1 Hr x 3  . sodium chloride  3 mL Intravenous Q12H  . [START ON 02/01/2014] vancomycin  500 mg Intravenous Q24H    Objective: Vital signs in last 24 hours: Temp:  [97.5 F (36.4 C)-100.7 F (38.2 C)] 98.2 F (36.8 C) (10/25 0400) Pulse Rate:  [89-117] 102 (10/25 1025) Resp:  [19-29] 24 (10/25 1025) BP: (116-200)/(77-118) 179/93 mmHg (10/25 1025) SpO2:  [96 %-100 %] 99 % (10/25 1025) Weight:  [39.3 kg (86 lb 10.3 oz)] 39.3 kg (86 lb 10.3 oz) (10/25 0500)   General appearance: alert, delirious and mild distress Resp: clear to auscultation bilaterally Cardio: regular rate and rhythm GI: normal findings: bowel sounds normal and soft,  non-tender Neurologic: Mental status: alert, does not answer questions, ? tracks, moans  Lab Results  Recent Labs  01/30/14 0850 01/31/14 0004 01/31/14 0250  WBC 8.9  --  9.3  HGB 7.2* 10.0* 10.0*  HCT 22.0* 29.9* 29.6*  NA 152*  --  151*  K 3.1*  --  3.2*  CL 116*  --  115*  CO2 24  --  22  BUN 70*  --  55*  CREATININE 2.10*  --  1.64*   Liver Panel  Recent Labs  01/29/14 0845 01/30/14 0850  PROT 7.5 6.5  ALBUMIN 2.8* 2.1*  AST 16 11  ALT 6 5  ALKPHOS 69 52  BILITOT 0.4 0.7  BILIDIR <0.2  --   IBILI NOT CALCULATED  --    Sedimentation Rate No results found for this basename: ESRSEDRATE,  in the last 72 hours C-Reactive Protein No results found for this basename: CRP,  in the last 72 hours  Microbiology: Recent Results (from the past 240 hour(s))  URINE CULTURE     Status: None    Collection Time    01/27/14  3:26 PM      Result Value Ref Range Status   Specimen Description URINE, CATHETERIZED   Final   Special Requests NONE   Final   Culture  Setup Time     Final   Value: 01/27/2014 22:22     Performed at [facility]   Colony Count     Final   Value: NO GROWTH     [facility]formed [facility]   Culture     Final   Value: NO GROWTH     Performed at [facility]   Report Status 01/28/2014 FINAL   Final  CULTURE, BLOOD (ROUTINE X 2)     Status: None   Collection Time    01/29/14  9:19 AM      Result Value Ref Range Status   Specimen Description BLOOD LEFT ARM   Final   Special Requests BOTTLES DRAWN AEROBIC AND ANAEROBIC 10CC   Final   Culture  Setup Time     Final   Value: 01/29/2014 15:04     Performed at [facility]   Culture     Final   Value:        BLOOD CULTURE RECEIVED NO GROWTH TO DATE CULTURE WILL BE HELD FOR 5 DAYS BEFORE ISSUING A FINAL NEGATIVE REPORT     Performed at [facility]   Report Status PENDING   Incomplete  CULTURE, BLOOD (ROUTINE X 2)     Status: None   Collection Time    01/29/14  9:30 AM      Result Value Ref Range Status   Specimen Description BLOOD LEFT HAND   Final   Special Requests BOTTLES DRAWN AEROBIC AND ANAEROBIC 5CC   Final   Culture  Setup Time     Final   Value: 01/29/2014 15:04     Performed at [facility]   Culture     Final   Value:        BLOOD CULTURE RECEIVED NO GROWTH TO DATE CULTURE WILL BE HELD FOR 5 DAYS BEFORE ISSUING A FINAL NEGATIVE REPORT     Performed at [facility]   Report Status PENDING   Incomplete  CLOSTRIDIUM DIFFICILE BY PCR     Status: None   Collection Time    01/29/14 11:10 AM      Result  Value Ref Range Status   C difficile by pcr NEGATIVE  NEGATIVE Final  MRSA PCR SCREENING     Status: Abnormal   Collection Time    01/29/14  3:40 PM      Result Value Ref Range Status   MRSA by PCR POSITIVE (*) NEGATIVE Final   Comment:             The GeneXpert MRSA Assay (FDA     approved for NASAL specimens     only), is one component of a     comprehensive MRSA colonization     surveillance program. It is not     intended to diagnose MRSA     infection nor to guide or     monitor treatment for     MRSA infections.     RESULT CALLED TO, READ BACK BY AND VERIFIED WITH:     Judge Stall RN 17:50 01/29/14 ([email or web address])  AFB CULTURE, BLOOD     Status: None   Collection Time    01/30/14 11:13 AM      Result Value Ref Range Status   Specimen Description BLOOD LEFT ARM   Final   Special Requests BOTTLES DRAWN AEROBIC ONLY 7CC   Final   Culture     Final   Value: CULTURE WILL BE EXAMINED FOR 6 WEEKS BEFORE ISSUING A FINAL REPORT     Performed at [facility]   Report Status PENDING   Incomplete  CSF CULTURE     Status: None   Collection Time    01/30/14  5:01 PM      Result Value Ref Range Status   Specimen Description CSF   Final   Special Requests NONE   Final   Gram Stain     Final   Value: CYTOSPIN SLIDE WBC PRESENT, PREDOMINANTLY MONONUCLEAR     NO ORGANISMS SEEN     Performed at [facility]     Performed at [facility]   Culture PENDING   Incomplete   Report Status PENDING   Incomplete  GRAM STAIN     Status: None   Collection Time    01/30/14  5:01 PM      Result Value Ref Range Status   Specimen Description CSF   Final   Special Requests NONE   Final   Gram Stain     Final   Value: CYTOSPIN SLIDE     WBC PRESENT, PREDOMINANTLY MONONUCLEAR     NO ORGANISMS SEEN   Report Status 01/30/2014 FINAL   Final    Studies/Results: Dg Fluoro Guide Lumbar Puncture  01/30/2014   Trudie Reed, MD     01/30/2014  4:33 PM Fluoroscopic guided lumbar puncture performed at L3-L4.  Opening  pressure 17 cmH2O.  CSF slightly yellow in all samples (initially  slightly bloody despite non-traumatic tap).  A total of 10 mL of  CSF sent to lab for analysis.  No complications.       Assessment/Plan: Encephalopathy, found down/incontenent  Aspiration pna  CD4 10  ARF  HTN  Prev zoster  Wt loss   Total days of antibiotics 3 (vanco/zosyn)        1 Acycylovir  No change in abx for now Had LP yesterday under flouro High protein, WBC and RBC. She is now on acyclovir.  Her crypto on Csf is (-), will not add fluconazole.  Will ask lab to add CMV/EBV pcr to CSF  Johny Sax Infectious Diseases (pager) [phone] [email or web address] 01/31/2014, 10:41 AM  LOS: 4 days

## 2014-01-31 NOTE — Progress Notes (Signed)
Patient ID: Desiree Hale  female  JYN:[id number]    DOB: Nov 05, 1970    DOA: 01/27/2014  PCP: No primary provider on file.   Brief history of present illness  Patient is a 43 year old female with past medical history of hypertension, marijuana use who presented to [facility] ED 01/27/2014 after she was found unresponsive at home by a family member today. Patient was found shaking, lying on the floor, unable to speak. Patient's son at the bedside says he spoke with her over the phone at 4 PM one day prior to the admission and patient seemed to be fine. Neurology consulted. Patient was admitted for acute encephalopathy and dehydration. On 10/23, rapid response was called due to tachypnea and acute respiratory distress, chest x-ray consistent with right lung pneumonia possibly due to aspiration at the time of seizures. Patient transferred to step down unit.   Assessment/Plan: Principal Problem:   Acute respiratory failure likely due to aspiration pneumonia, SIRS - CXR showed increased density in the right lung base, on IV vancomycin and Zosyn - Urine strep antigen negative, urine Legionella antigen negative  Active Problems:   Acute encephalopathy: Unclear etiology, was in her normal state of health (oriented x3, conversing) before this admission. - Spinal tap done, CSF studies showed elevated protein, glucose, lymphocytosis, started on IV acyclovir - EEG negative for acute epileptiform activity - Appreciate infectious disease recommendations, cryptococcal antigen negative, CSF cultures pending, HSV PCR, EBV and CMV pending AFB, hepatitis panel negative, RPR nonreactive - CD4 count low, HIV reactive, HIV viral load pending   Seizures - Continue IV Keppra  High anion gap metabolic acidosis with acute renal insufficiency: Unclear etiology - Metabolic acidosis improved - Creatinine function improving  Hypernatremia: - Continue D5 solution, swallow testing today, if fails, will need PANDA tube   HTN (hypertension) with Tachycardia - Continue IV beta blocker, labetalol as needed    Microcytic anemia: Hemoglobin currently stable   elevated troponins: Likely due to demand ischemia from seizures, SIRS  - Troponins trending down, 2-D echo showed EF of 55-60%, grade 1 diastolic dysfunction  - Continue IV Lopressor, aspirin pr, statin when taking oral  DVT Prophylaxis: SCD's  Code Status: FULL CODE  Family Communication: called patient's daughter, Ms Tamikka Pilger, unable to make contact   Disposition: Continue to monitor in SDU  Consultants:  Infectious disease, Dr. Ilsa Iha, Dr Ninetta Lights   Neurology, Dr. Cyril Mourning  Procedures:  MRI brain    2-D echo  Spinal tap  Antibiotics:  IV vancomycin    IV Zosyn   IV acyclovir   Subjective: Patient seen and examined, somewhat more alert and tracking  today,  but still not following commands  Objective: Weight change: 0.2 kg (7.1 oz)  Intake/Output Summary (Last 24 hours) at 01/31/14 1102 Last data filed at 01/31/14 1020  Gross per 24 hour  Intake 3837.8 ml  Output   1890 ml  Net 1947.8 ml   Blood pressure 179/93, pulse 102, temperature 98.2 F (36.8 C), temperature source Axillary, resp. rate 24, height 5\' 7"  (1.702 m), weight 39.3 kg (86 lb 10.3 oz), SpO2 99.00%.  Physical Exam: General: not following verbal commands, confused, cachetic looking  CVS: S1-S2 clear, no murmur rubs or gallops Chest: CTA anteriorly  Abdomen: soft nontender, nondistended, normal bowel sounds  Extremities: no cyanosis, clubbing or edema noted bilaterally Neuro:  not following commands   Lab Results: Basic Metabolic Panel:  Recent Labs Lab 01/30/14 0850 01/31/14 0250  NA 152* 151*  K 3.1*  3.2*  CL 116* 115*  CO2 24 22  GLUCOSE 132* 111*  BUN 70* 55*  CREATININE 2.10* 1.64*  CALCIUM 9.1 9.2   Liver Function Tests:  Recent Labs Lab 01/29/14 0845 01/30/14 0850  AST 16 11  ALT 6 5  ALKPHOS 69 52  BILITOT 0.4 0.7  PROT  7.5 6.5  ALBUMIN 2.8* 2.1*   No results found for this basename: LIPASE, AMYLASE,  in the last 168 hours  Recent Labs Lab 01/27/14 1514 01/29/14 0845  AMMONIA 21 22   CBC:  Recent Labs Lab 01/27/14 1512  01/30/14 0850 01/31/14 0004 01/31/14 0250  WBC 5.6  < > 8.9  --  9.3  NEUTROABS 4.5  --   --   --   --   HGB 10.9*  < > 7.2* 10.0* 10.0*  HCT 32.8*  < > 22.0* 29.9* 29.6*  MCV 74.9*  < > 75.6*  --  79.8  PLT 383  < > 209  --  182  < > = values in this interval not displayed. Cardiac Enzymes:  Recent Labs Lab 01/27/14 1524  01/28/14 1500 01/28/14 1900 01/29/14 0105 01/29/14 0845  CKTOTAL 58  --   --   --   --  142  TROPONINI  --   < > 0.53* 0.52* 0.49*  --   < > = values in this interval not displayed. BNP: No components found with this basename: POCBNP,  CBG:  Recent Labs Lab 01/29/14 1552 01/29/14 2202 01/30/14 0757 01/30/14 2204 01/31/14 0831  GLUCAP 142* 146* 128* 114* 110*     Micro Results: Recent Results (from the past 240 hour(s))  URINE CULTURE     Status: None   Collection Time    01/27/14  3:26 PM      Result Value Ref Range Status   Specimen Description URINE, CATHETERIZED   Final   Special Requests NONE   Final   Culture  Setup Time     Final   Value: 01/27/2014 22:22     Performed at [facility]   Colony Count     Final   Value: NO GROWTH     [facility]formed at [facility]   Culture     Final   Value: NO GROWTH     Performed at [facility]   Report Status 01/28/2014 FINAL   Final  CULTURE, BLOOD (ROUTINE X 2)     Status: None   Collection Time    01/29/14  9:19 AM      Result Value Ref Range Status   Specimen Description BLOOD LEFT ARM   Final   Special Requests BOTTLES DRAWN AEROBIC AND ANAEROBIC 10CC   Final   Culture  Setup Time     Final   Value: 01/29/2014 15:04     Performed at [facility]   Culture     Final   Value:        BLOOD CULTURE RECEIVED NO GROWTH TO DATE CULTURE WILL BE HELD  FOR 5 DAYS BEFORE ISSUING A FINAL NEGATIVE REPORT     Performed at [facility]   Report Status PENDING   Incomplete  CULTURE, BLOOD (ROUTINE X 2)     Status: None   Collection Time    01/29/14  9:30 AM      Result Value Ref Range Status   Specimen Description BLOOD LEFT HAND   Final   Special Requests BOTTLES DRAWN AEROBIC AND ANAEROBIC 5CC  Final   Culture  Setup Time     Final   Value: 01/29/2014 15:04     Performed at [facility]   Culture     Final   Value:        BLOOD CULTURE RECEIVED NO GROWTH TO DATE CULTURE WILL BE HELD FOR 5 DAYS BEFORE ISSUING A FINAL NEGATIVE REPORT     Performed at [facility]   Report Status PENDING   Incomplete  CLOSTRIDIUM DIFFICILE BY PCR     Status: None   Collection Time    01/29/14 11:10 AM      Result Value Ref Range Status   C difficile by pcr NEGATIVE  NEGATIVE Final  MRSA PCR SCREENING     Status: Abnormal   Collection Time    01/29/14  3:40 PM      Result Value Ref Range Status   MRSA by PCR POSITIVE (*) NEGATIVE Final   Comment:            The GeneXpert MRSA Assay (FDA     approved for NASAL specimens     only), is one component of a     comprehensive MRSA colonization     surveillance program. It is not     intended to diagnose MRSA     infection nor to guide or     monitor treatment for     MRSA infections.     RESULT CALLED TO, READ BACK BY AND VERIFIED WITH:     Judge StallP. KEATON RN 17:50 01/29/14 ([email or web address])  AFB CULTURE, BLOOD     Status: None   Collection Time    01/30/14 11:13 AM      Result Value Ref Range Status   Specimen Description BLOOD LEFT ARM   Final   Special Requests BOTTLES DRAWN AEROBIC ONLY 7CC   Final   Culture     Final   Value: CULTURE WILL BE EXAMINED FOR 6 WEEKS BEFORE ISSUING A FINAL REPORT     Performed at [facility]   Report Status PENDING   Incomplete  CSF CULTURE     Status: None   Collection Time    01/30/14  5:01 PM      Result Value Ref Range Status   Specimen  Description CSF   Final   Special Requests NONE   Final   Gram Stain     Final   Value: CYTOSPIN SLIDE WBC PRESENT, PREDOMINANTLY MONONUCLEAR     NO ORGANISMS SEEN     [facility]     Performed at [facility]   Culture PENDING   Incomplete   Report Status PENDING   Incomplete  GRAM STAIN     Status: None   Collection Time    01/30/14  5:01 PM      Result Value Ref Range Status   Specimen Description CSF   Final   Special Requests NONE   Final   Gram Stain     Final   Value: CYTOSPIN SLIDE     WBC PRESENT, PREDOMINANTLY MONONUCLEAR     NO ORGANISMS SEEN   Report Status 01/30/2014 FINAL   Final    Studies/Results: Ct Head Wo Contrast  01/27/2014   CLINICAL DATA:  Found unresponsive.  Altered mental status.  EXAM: CT HEAD WITHOUT CONTRAST  CT CERVICAL SPINE WITHOUT CONTRAST  TECHNIQUE: Multidetector CT imaging of the head and cervical spine was performed following the standard protocol without  intravenous contrast. Multiplanar CT image reconstructions of the cervical spine were also generated.  COMPARISON:  None.  FINDINGS: CT HEAD FINDINGS  The ventricles are normal in size and configuration. No extra-axial fluid collections are identified. The gray-white differentiation is normal. No CT findings for acute intracranial process such as hemorrhage or infarction. No mass lesions. The brainstem and cerebellum are grossly normal.  The bony structures are intact. The paranasal sinuses and mastoid air cells are clear. The globes are intact.  CT CERVICAL SPINE FINDINGS  Normal alignment of the cervical vertebral bodies. Disc spaces and vertebral bodies are maintained. No acute fracture or abnormal prevertebral soft tissue swelling. The facets are normally aligned. The skullbase C1 and C1-2 articulations are maintained. The dens is normal. No large disc protrusions, spinal or foraminal stenosis. The lung apices are clear. Emphysematous changes are noted. There is some air  in the supraclavicular fossa possibly due to a laceration. No pneumothorax is identified.  IMPRESSION: No acute intracranial findings or skull fracture.  Normal CT examination of the cervical spine. No cervical spine fracture.   Electronically Signed   By: Loralie Champagne M.D.   On: 01/27/2014 17:27   Ct Cervical Spine Wo Contrast  01/27/2014   CLINICAL DATA:  Found unresponsive.  Altered mental status.  EXAM: CT HEAD WITHOUT CONTRAST  CT CERVICAL SPINE WITHOUT CONTRAST  TECHNIQUE: Multidetector CT imaging of the head and cervical spine was performed following the standard protocol without intravenous contrast. Multiplanar CT image reconstructions of the cervical spine were also generated.  COMPARISON:  None.  FINDINGS: CT HEAD FINDINGS  The ventricles are normal in size and configuration. No extra-axial fluid collections are identified. The gray-white differentiation is normal. No CT findings for acute intracranial process such as hemorrhage or infarction. No mass lesions. The brainstem and cerebellum are grossly normal.  The bony structures are intact. The paranasal sinuses and mastoid air cells are clear. The globes are intact.  CT CERVICAL SPINE FINDINGS  Normal alignment of the cervical vertebral bodies. Disc spaces and vertebral bodies are maintained. No acute fracture or abnormal prevertebral soft tissue swelling. The facets are normally aligned. The skullbase C1 and C1-2 articulations are maintained. The dens is normal. No large disc protrusions, spinal or foraminal stenosis. The lung apices are clear. Emphysematous changes are noted. There is some air in the supraclavicular fossa possibly due to a laceration. No pneumothorax is identified.  IMPRESSION: No acute intracranial findings or skull fracture.  Normal CT examination of the cervical spine. No cervical spine fracture.   Electronically Signed   By: Loralie Champagne M.D.   On: 01/27/2014 17:27   Mr Brain Wo Contrast  01/28/2014   CLINICAL DATA:   Unresponsive. The patient had an episode with loss of consciousness. She has been aphasic since that episode despite regaining who consciousness otherwise.  EXAM: MRI HEAD WITHOUT CONTRAST  TECHNIQUE: Multiplanar, multiecho pulse sequences of the brain and surrounding structures were obtained without intravenous contrast.  COMPARISON:  CT head without contrast 01/27/2014.  FINDINGS: Abnormal cortical and subcortical T2 hyperintensity is evident in the posterior insular cortex and along the sylvian fissure bilaterally. There is abnormal signal extending into the operculum bilaterally, left greater than right. This signal abnormality extends nearly to the vertex within the precentral sulcus. There is no significant associated restricted diffusion. No hemorrhage or mass lesion is present. There is no significant mass effect.  Flow is present in the major intracranial arteries. The globes and orbits are intact. Mild  mucosal thickening is noted in the ethmoid air cells and sphenoid sinuses bilaterally.  The coronal T2 weighted images are somewhat degraded by patient motion. No definite hippocampal abnormality is evident.  IMPRESSION: 1. Diffuse cortical and subcortical signal abnormality bilaterally within the temporal and posterior frontal lobes as described. This is most likely a postictal phenomenon. There is no restricted diffusion to suggest acute or subacute ischemia. No mass lesion is evident. This could be related to elevated blood pressure similar to posterior reversible encephalopathy syndrome. The distribution is atypical. Recommend follow-up MRI of the brain without and with contrast in 2-3 weeks as the symptoms resolve. 2. Minimal sinus disease.   Electronically Signed   By: Gennette Pac M.D.   On: 01/28/2014 10:27   Dg Chest Port 1 View  01/29/2014   CLINICAL DATA:  Shortness of breath, unresponsive  EXAM: PORTABLE CHEST - 1 VIEW  COMPARISON:  01/27/2014  FINDINGS: The cardiac shadow is stable. The  lungs are well aerated bilaterally. Increasing density in the medial right lung base is noted consistent with atelectasis and/or early infiltrate. No pneumothorax or sizable effusion is seen.  IMPRESSION: Increased density in the right lung base related to atelectasis/early infiltrate.   Electronically Signed   By: Alcide Clever M.D.   On: 01/29/2014 06:55   Dg Chest Portable 1 View  01/27/2014   CLINICAL DATA:  Altered mental status  EXAM: PORTABLE CHEST - 1 VIEW  COMPARISON:  None.  FINDINGS: The patient is rotated. There is no focal parenchymal opacity, pleural effusion, or pneumothorax. The heart and mediastinal contours are unremarkable.  The osseous structures are unremarkable.  IMPRESSION: No active disease.   Electronically Signed   By: Elige Ko   On: 01/27/2014 16:17    Medications: Scheduled Meds: . acyclovir  10 mg/kg Intravenous Q24H  . antiseptic oral rinse  7 mL Mouth Rinse q12n4p  . aspirin  300 mg Rectal Daily  . chlorhexidine  15 mL Mouth Rinse BID  . cloNIDine  0.2 mg Transdermal Weekly  . levETIRAcetam  500 mg Intravenous Q12H  . metoprolol  5 mg Intravenous 4 times per day  . mupirocin ointment  1 application Nasal BID  . piperacillin-tazobactam (ZOSYN)  IV  3.375 g Intravenous Q8H  . potassium chloride  10 mEq Intravenous Q1 Hr x 3  . sodium chloride  3 mL Intravenous Q12H  . [START ON 02/01/2014] vancomycin  500 mg Intravenous Q24H      LOS: 4 days   RAI,RIPUDEEP M.D. [facility] 01/31/2014, 11:02 AM Pager: [phone]  If 7PM-7AM, please contact night-coverage [email or web address] Password [email or web address]

## 2014-01-31 NOTE — Progress Notes (Signed)
Speech Language Pathology Treatment: Dysphagia;Cognitive-Linquistic  Patient Details Name: Desiree BeamsKristy Hale MRN: [id number] DOB: 09/28/1970 Today's Date: 01/31/2014 Time: 1420-1450 SLP Time Calculation (min): 30 min  Assessment / Plan / Recommendation Clinical Impression  F/u to address PO readiness and communication.  Oral care provided. Pt demonstrated emerging ability to follow commands, initially after clinician model, then with faded cues (wrinkle nose, raise eyebrows, widen eyes, open mouth).  Continues to vocalize seemingly without control, but able to slightly modulate sound of voice when cued.  After oral care was provided, pt anticipated arrival of spoon at lips; effort to manipulate ice chip was identified, but not yet functional.  Inconsistent swallow response was triggered.  Pt is demonstrating subtle changes in bulbar motor output, but requires maximal multimodal cueing. She is not yet ready for POs - enteral feeding may need to be considered.  Continue SLP for communication and swallowing.        HPI HPI: 43 year old female with past medical history of hypertension, marijuana use who [facility]/27/2014 after she was found unresponsive at home.  Dx include acute encephalopathy, unclear etiology, probable seizure.  Paraneoplastic, autoimmune encephalitis, HIV encephalitis in the differential.  LP pending.  MRI: "Diffuse cortical and subcortical signal abnormality bilaterally within the temporal and posterior frontal lobes as described. This is most likely a postictal phenomenon. "  Clinical swallow and speech language evals completed on 10/22.  Goals established to address severe dysphagia and cogn/communication deficits.  New swallow orders received 10/25.       Pertinent Vitals Pain Assessment: Faces Faces Pain Scale: Hurts little more Pain Intervention(s): Monitored during session;Limited activity within patient's tolerance  SLP Plan  Continue with current plan of care     Recommendations Diet recommendations: NPO Medication Administration: Via alternative means              Oral Care Recommendations:  (QD) Follow up Recommendations:  (tba) Plan: Continue with current plan of care   Nuri Larmer L. Samson Fredericouture, [place] CCC/SLP Pager [phone]      Blenda MountsCouture, Thurl Boen Laurice 01/31/2014, 3:01 PM

## 2014-01-31 NOTE — Progress Notes (Signed)
Report given to [facility] RN. Patient transferred to 2C01 via bed. VS WDL.

## 2014-02-01 ENCOUNTER — Inpatient Hospital Stay (HOSPITAL_COMMUNITY): Payer: Medicaid Other

## 2014-02-01 DIAGNOSIS — G9340 Encephalopathy, unspecified: Secondary | ICD-10-CM

## 2014-02-01 LAB — GLUCOSE, CAPILLARY
GLUCOSE-CAPILLARY: 118 mg/dL — AB (ref 70–99)
Glucose-Capillary: 112 mg/dL — ABNORMAL HIGH (ref 70–99)
Glucose-Capillary: 122 mg/dL — ABNORMAL HIGH (ref 70–99)
Glucose-Capillary: 132 mg/dL — ABNORMAL HIGH (ref 70–99)

## 2014-02-01 LAB — GI PATHOGEN PANEL BY PCR, STOOL
C difficile toxin A/B: NEGATIVE
Campylobacter by PCR: NEGATIVE
Cryptosporidium by PCR: NEGATIVE
E coli (ETEC) LT/ST: NEGATIVE
E coli (STEC): NEGATIVE
E coli 0157 by PCR: NEGATIVE
G lamblia by PCR: NEGATIVE
Norovirus GI/GII: NEGATIVE
Rotavirus A by PCR: NEGATIVE
Salmonella by PCR: NEGATIVE
Shigella by PCR: NEGATIVE

## 2014-02-01 LAB — CBC
HCT: 30.4 % — ABNORMAL LOW (ref 36.0–46.0)
HEMOGLOBIN: 9.9 g/dL — AB (ref 12.0–15.0)
MCH: 25.9 pg — AB (ref 26.0–34.0)
MCHC: 32.6 g/dL (ref 30.0–36.0)
MCV: 79.6 fL (ref 78.0–100.0)
Platelets: 148 10*3/uL — ABNORMAL LOW (ref 150–400)
RBC: 3.82 MIL/uL — ABNORMAL LOW (ref 3.87–5.11)
RDW: 16.7 % — ABNORMAL HIGH (ref 11.5–15.5)
WBC: 9.2 10*3/uL (ref 4.0–10.5)

## 2014-02-01 LAB — HERPES SIMPLEX VIRUS(HSV) DNA BY PCR
HSV 1 DNA: NOT DETECTED
HSV 2 DNA: DETECTED

## 2014-02-01 LAB — LIPID PANEL
Cholesterol: 120 mg/dL (ref 0–200)
HDL: 46 mg/dL (ref >39)
LDL CALC: 50 mg/dL (ref 0–99)
TRIGLYCERIDES: 118 mg/dL (ref <150)
Total CHOL/HDL Ratio: 2.6 RATIO
VLDL: 24 mg/dL (ref 0–40)

## 2014-02-01 LAB — BASIC METABOLIC PANEL
Anion gap: 13 (ref 5–15)
BUN: 36 mg/dL — ABNORMAL HIGH (ref 6–23)
CHLORIDE: 111 meq/L (ref 96–112)
CO2: 20 mEq/L (ref 19–32)
Calcium: 9 mg/dL (ref 8.4–10.5)
Creatinine, Ser: 1.49 mg/dL — ABNORMAL HIGH (ref 0.50–1.10)
GFR calc Af Amer: 48 mL/min — ABNORMAL LOW (ref >90)
GFR calc non Af Amer: 42 mL/min — ABNORMAL LOW (ref >90)
GLUCOSE: 106 mg/dL — AB (ref 70–99)
POTASSIUM: 3.5 meq/L — AB (ref 3.7–5.3)
Sodium: 144 mEq/L (ref 137–147)

## 2014-02-01 LAB — GIARDIA/CRYPTOSPORIDIUM SCREEN(EIA)
Cryptosporidium Screen (EIA): NEGATIVE
Giardia Screen - EIA: NEGATIVE

## 2014-02-01 LAB — PATHOLOGIST SMEAR REVIEW

## 2014-02-01 MED ORDER — JEVITY 1.2 CAL PO LIQD
1000.0000 mL | ORAL | Status: DC
  Administered 2014-02-01 – 2014-02-02 (×2): 1000 mL
  Administered 2014-02-03: 10:00:00
  Filled 2014-02-01 (×7): qty 1000

## 2014-02-01 MED ORDER — DEXTROSE 5 % IV SOLN
10.0000 mg/kg | Freq: Two times a day (BID) | INTRAVENOUS | Status: DC
  Administered 2014-02-01 – 2014-02-03 (×5): 420 mg via INTRAVENOUS
  Filled 2014-02-01 (×6): qty 8.4

## 2014-02-01 MED ORDER — VANCOMYCIN HCL IN DEXTROSE 750-5 MG/150ML-% IV SOLN
750.0000 mg | INTRAVENOUS | Status: DC
  Administered 2014-02-01 – 2014-02-02 (×2): 750 mg via INTRAVENOUS
  Filled 2014-02-01 (×3): qty 150

## 2014-02-01 MED ORDER — ACETAMINOPHEN 160 MG/5ML PO SOLN
650.0000 mg | ORAL | Status: DC | PRN
  Administered 2014-02-01 – 2014-02-12 (×6): 650 mg
  Filled 2014-02-01 (×7): qty 20.3

## 2014-02-01 MED ORDER — SULFAMETHOXAZOLE-TMP DS 800-160 MG PO TABS
1.0000 | ORAL_TABLET | ORAL | Status: DC
  Administered 2014-02-01: 1 via ORAL
  Filled 2014-02-01 (×2): qty 1

## 2014-02-01 MED ORDER — METOPROLOL TARTRATE 1 MG/ML IV SOLN
5.0000 mg | INTRAVENOUS | Status: DC
  Administered 2014-02-01: 5 mg via INTRAVENOUS

## 2014-02-01 MED ORDER — AZITHROMYCIN 600 MG PO TABS
1200.0000 mg | ORAL_TABLET | ORAL | Status: DC
  Administered 2014-02-01: 1200 mg via ORAL
  Filled 2014-02-01: qty 2

## 2014-02-01 MED ORDER — METOPROLOL TARTRATE 1 MG/ML IV SOLN
5.0000 mg | INTRAVENOUS | Status: DC
  Administered 2014-02-01 – 2014-02-03 (×10): 5 mg via INTRAVENOUS
  Filled 2014-02-01 (×13): qty 5

## 2014-02-01 NOTE — Progress Notes (Signed)
NEURO HOSPITALIST PROGRESS NOTE   SUBJECTIVE:                                                                                                                        Eyes open, moans, otherwise non verbal. No further seizures noted. CSF WBC 78, RBC 1479, protein 399, glucose 85. Gram stain negative. Cryptococcal antigen negative. CD4 count low, HIV reactive, HIV viral load pending. Started on acyclovir. Keppra 500 mg BID.   OBJECTIVE:                                                                                                                           Vital signs in last 24 hours: Temp:  [98.4 F (36.9 C)-101.3 F (38.5 C)] 100.7 F (38.2 C) (10/26 0400) Pulse Rate:  [98-121] 110 (10/26 0535) Resp:  [19-39] 29 (10/26 0535) BP: (161-200)/(87-103) 161/95 mmHg (10/26 0535) SpO2:  [91 %-100 %] 98 % (10/26 0535) Weight:  [41.9 kg (92 lb 6 oz)] 41.9 kg (92 lb 6 oz) (10/26 0535)  Intake/Output from previous day: 10/25 0701 - 10/26 0700 In: 2907.8 [I.V.:2100; IV Piggyback:807.8] Out: 1140 [Urine:1140] Intake/Output this shift:   Nutritional status: NPO  History reviewed. No pertinent past medical history.   Neurologic Exam:  Patient is awake but is non verbal and does not follow commands.  Pupils were equal and reacted normally to light.  Extraocular movements were full on right left lateral gaze and tracks my motions in the room  Blinks to threat bilaterally.  She appear to have moderate right lower facial weakness.  Slight increase in muscle tone throughout.Resistance to passive movement was symmetrical. Withdrawal movements to noxious stimuli were equal.  Responses to sensory testing unreliable.  Deep tendon reflexes were 1+ and symmetrical.  Plantar responses were flexor bilaterally    Lab Results: Lab Results  Component Value Date/Time   CHOL 182 01/28/2014  1:37 AM   Lipid Panel No results found for this basename: CHOL, TRIG,  HDL, CHOLHDL, VLDL, LDLCALC,  in the last 72 hours  Studies/Results: Dg Abd Portable 1v  01/31/2014   CLINICAL DATA:  NG tube placement.  EXAM: PORTABLE ABDOMEN - 1 VIEW  COMPARISON:  None.  FINDINGS: Bowel gas pattern is  nonobstructive. A feeding tube follows the greater curvature of the stomach and terminates in the expected location of the antrum/pylorus.  IMPRESSION: Feeding tube terminates in the expected location of the antrum/pylorus.   Electronically Signed   By: Britta MccreedySusan  Turner M.D.   On: 01/31/2014 18:58   Dg Fluoro Guide Lumbar Puncture  01/30/2014   Trudie Reedaniel Entrikin, MD     01/30/2014  4:33 PM Fluoroscopic guided lumbar puncture performed at L3-L4.  Opening  pressure 17 cmH2O.  CSF slightly yellow in all samples (initially  slightly bloody despite non-traumatic tap).  A total of 10 mL of  CSF sent to lab for analysis.  No complications.     MEDICATIONS                                                                                                                        Scheduled: . acyclovir  10 mg/kg Intravenous Q24H  . antiseptic oral rinse  7 mL Mouth Rinse q12n4p  . aspirin  300 mg Rectal Daily  . chlorhexidine  15 mL Mouth Rinse BID  . cloNIDine  0.2 mg Transdermal Weekly  . levETIRAcetam  500 mg Intravenous Q12H  . metoprolol  5 mg Intravenous 4 times per day  . mupirocin ointment  1 application Nasal BID  . piperacillin-tazobactam (ZOSYN)  IV  3.375 g Intravenous Q8H  . sodium chloride  3 mL Intravenous Q12H  . vancomycin  500 mg Intravenous Q24H    ASSESSMENT/PLAN:                                                                                                            43 y/o with new onset encephalopathy and probable seizure. Profound weight loss.  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  Paraneoplastic, autoimmune encephalitis, HIV encephalitis in the differential.  CSF as described above: on acyclovir pending HSV CSF. Paraneoplastic CNS profile sent,  but more inclined to believe this is HIV encephalitis versus herpes encephalitis  presenting with seizures.  Elspeth Choeter Devean Skoczylas, DO [facility] [phone]  If 7pm- 7am, please page neurology on call as listed in AMION.   02/01/2014, 7:17 AM

## 2014-02-01 NOTE — Progress Notes (Addendum)
INITIAL NUTRITION ASSESSMENT  DOCUMENTATION CODES Per approved criteria  -Severe malnutrition in the context of chronic illness -Underweight   INTERVENTION: Initiate Jevity 1.2 formula at 15 ml/hr and increase by 10 ml every 12 hours to goal rate of 55 ml/hr to provide 1584 kcals, 73 gm protein, 1065 ml of free water Monitor Mg, Phos, K+ levels RD to follow for nutrition care plan  NUTRITION DIAGNOSIS: Inadequate oral intake related to inability to eat, acute encephalopathy as evidenced by NPO status   Goal: Pt to meet >/= 90% of their estimated nutrition needs   Monitor:  TF regimen & tolerance, PO diet advancement, weight, labs, I/O's  Reason for Assessment: Consult  43 y.o. female  Admitting Dx: Acute respiratory failure  ASSESSMENT: 43 year old Female with PMH of HTN, marijuana use who [facility] after she was found unresponsive at home by a family member. CT head showed no acute intracranial findings. Chest x-ray showed no active disease. Patient has received fluid boluses in ED. Patient was admitted for further evaluation and management of altered mental status, dehydration.  Patient with + HIV test.  Patient s/p procedure 10/24: HC DG FLUORO GUIDE LUMBAR PUNCTURE   RD unable to obtain nutrition hx.  Speech Path following.  Pt not ready for PO diet.  Panda tube in place (tip in antrum/pylorus).    RD consulted for TF initiation & management.  Nutrition Focused Physical Exam:  Subcutaneous Fat:  Orbital Region: N/A Upper Arm Region: severe depletion Thoracic and Lumbar Region: N/A  Muscle:  Temple Region: moderate depletion Clavicle Bone Region: severe depletion Clavicle and Acromion Bone Region: severe depletion Scapular Bone Region: N/A Dorsal Hand: N/A Patellar Region: severe depeltion Anterior Thigh Region: severe depletion Posterior Calf Region: severe depletion  Edema: none   Patient meets criteria for severe malnutrition in the  context of chronic illness as evidenced by severe muscle & subcutaneous fat loss.  Patient at risk for refeeding given malnutrition.   Height: Ht Readings from Last 1 Encounters:  01/27/14 5\' 7"  (1.702 m)    Weight: Wt Readings from Last 1 Encounters:  02/01/14 92 lb 6 oz (41.9 kg)    Ideal Body Weight: 135 lb  % Ideal Body Weight: 68%  Wt Readings from Last 10 Encounters:  02/01/14 92 lb 6 oz (41.9 kg)    Usual Body Weight: unable to obtain  % Usual Body Weight: ---  BMI:  Body mass index is 14.46 kg/(m^2).  Estimated Nutritional Needs: Kcal: 1450-1650 Protein: 70-80 gm Fluid: >/= 1.5 L  Skin: Intact  Diet Order: NPO  EDUCATION NEEDS: -No education needs identified at this time   Intake/Output Summary (Last 24 hours) at 02/01/14 0954 Last data filed at 02/01/14 0658  Gross per 24 hour  Intake 2657.8 ml  Output   1040 ml  Net 1617.8 ml    Labs:   Recent Labs Lab 01/30/14 0850 01/31/14 0250 02/01/14 0501  NA 152* 151* 144  K 3.1* 3.2* 3.5*  CL 116* 115* 111  CO2 24 22 20   BUN 70* 55* 36*  CREATININE 2.10* 1.64* 1.49*  CALCIUM 9.1 9.2 9.0  GLUCOSE 132* 111* 106*    CBG (last 3)   Recent Labs  01/31/14 1618 01/31/14 2005 02/01/14 0741  GLUCAP 105* 103* 112*    Scheduled Meds: . acyclovir  10 mg/kg Intravenous Q12H  . antiseptic oral rinse  7 mL Mouth Rinse q12n4p  . aspirin  300 mg Rectal Daily  . chlorhexidine  15 mL Mouth Rinse BID  . cloNIDine  0.2 mg Transdermal Weekly  . levETIRAcetam  500 mg Intravenous Q12H  . metoprolol  5 mg Intravenous 4 times per day  . mupirocin ointment  1 application Nasal BID  . piperacillin-tazobactam (ZOSYN)  IV  3.375 g Intravenous Q8H  . sodium chloride  3 mL Intravenous Q12H  . vancomycin  750 mg Intravenous Q24H    Continuous Infusions: . dextrose 5 % with KCl 20 mEq / L 20 mEq (01/31/14 2200)    History reviewed. No pertinent past medical history.  History reviewed. No pertinent past  surgical history.  Maureen ChattersKatie Christophe Rising, RD, LDN Pager #: [phone] After-Hours Pager #: [phone]

## 2014-02-01 NOTE — Progress Notes (Signed)
Physical Therapy Treatment Patient Details Name: Desiree Hale MRN: [id number] DOB: 1970/12/24 Today's Date: 02/01/2014    History of Present Illness 43 year old female with past medical history of hypertension, pancreatic cancer (per ED resident note, but this is not noted in H&P), marijuana use who [facility] after she was found unresponsive at home by a family member. Patient was found shaking, lying on the floor, unable to speak.    PT Comments    Pt with no participation in session.  Mobility limited 2/2 elevated BP 187/105 while simply supine in bed.  RN made aware of BPs and pt grimacing to the touch.  Will continue to follow along.    Follow Up Recommendations  SNF     Equipment Recommendations   (TBD)    Recommendations for Other Services       Precautions / Restrictions Precautions Precautions: Fall Precaution Comments: non-communicative Restrictions Weight Bearing Restrictions: No    Mobility  Bed Mobility               General bed mobility comments: Attempted re-positioning of pt in bed with pt grimacing when touched.  [facility]    Transfers                 General transfer comment: Pt with increased BP, limited activity to bed level.  Ambulation/Gait                 Stairs            Wheelchair Mobility    Modified Rankin (Stroke Patients Only)       Balance                                    Cognition Arousal/Alertness: Lethargic Behavior During Therapy: Flat affect Overall Cognitive Status: Difficult to assess Area of Impairment: Following commands;Attention   Current Attention Level: Focused                Exercises      General Comments        Pertinent Vitals/Pain Pain Assessment: Faces Faces Pain Scale: Hurts little more Pain Location: pt grimaces, but unable to determine location.   Pain Descriptors / Indicators: Grimacing Pain Intervention(s):  Monitored during session;Repositioned (RN made aware.)    Home Living Family/patient expects to be discharged to:: Skilled nursing facility                    Prior Function Level of Independence: Independent          PT Goals (current goals can now be found in the care plan section) Acute Rehab PT Goals PT Goal Formulation: Patient unable to participate in goal setting Time For Goal Achievement: 02/11/14 Potential to Achieve Goals: Fair Progress towards PT goals: Not progressing toward goals - comment (Elevated BP and no participation)    Frequency  Min 2X/week    PT Plan Frequency needs to be updated    Co-evaluation             End of Session   Activity Tolerance: Patient limited by lethargy;Treatment limited secondary to medical complications (Comment) (Increased BP) Patient left: in bed;with call bell/phone within reach;with bed alarm set;with family/visitor present     Time: [id number] PT Time Calculation (min): 14 min  Charges:  $Therapeutic Activity: 8-22 mins  G CodesSunny Hale:      Desiree Hale F, [place] CarolinaPT [phone] 02/01/2014, 11:32 AM

## 2014-02-01 NOTE — Progress Notes (Signed)
ANTIBIOTIC CONSULT NOTE - FOLLOW UP  Pharmacy Consult for Acyclovir Indication: Encephalitis, PNA  Allergies  Allergen Reactions  . Oxycodone Other (See Comments)    Patient Measurements: Height: 5\' 7"  (170.2 cm) Weight: 92 lb 6 oz (41.9 kg) IBW/kg (Calculated) : 61.6 Adjusted Body Weight:    Vital Signs: Temp: 97.8 F (36.6 C) (10/26 0745) Temp Source: Oral (10/26 0745) BP: 178/94 mmHg (10/26 0745) Pulse Rate: 110 (10/26 0535) Intake/Output from previous day: 10/25 0701 - 10/26 0700 In: 3157.8 [I.V.:2300; IV Piggyback:857.8] Out: 1140 [Urine:1140] Intake/Output from this shift:    Labs:  Recent Labs  01/30/14 0850 01/31/14 0004 01/31/14 0250 02/01/14 0501  WBC 8.9  --  9.3 9.2  HGB 7.2* 10.0* 10.0* 9.9*  PLT 209  --  182 148*  CREATININE 2.10*  --  1.64* 1.49*   Estimated Creatinine Clearance: 31.9 ml/min (by C-G formula based on Cr of 1.49). No results found for this basename: VANCOTROUGH, Leodis BinetVANCOPEAK, VANCORANDOM, GENTTROUGH, GENTPEAK, GENTRANDOM, TOBRATROUGH, TOBRAPEAK, TOBRARND, AMIKACINPEAK, AMIKACINTROU, AMIKACIN,  in the last 72 hours   Microbiology:   Anti-infectives   Start     Dose/Rate Route Frequency Ordered Stop   02/01/14 1000  acyclovir (ZOVIRAX) 420 mg in dextrose 5 % 100 mL IVPB     10 mg/kg  41.9 kg 108.4 mL/hr over 60 Minutes Intravenous Every 12 hours 02/01/14 0909     02/01/14 1000  vancomycin (VANCOCIN) IVPB 750 mg/150 ml premix     750 mg 150 mL/hr over 60 Minutes Intravenous Every 24 hours 02/01/14 0910     02/01/14 0823  vancomycin (VANCOCIN) 500 mg in sodium chloride 0.9 % 100 mL IVPB  Status:  Discontinued     500 mg 100 mL/hr over 60 Minutes Intravenous Every 24 hours 01/31/14 1029 02/01/14 0909   01/31/14 1200  piperacillin-tazobactam (ZOSYN) IVPB 3.375 g     3.375 g 12.5 mL/hr over 240 Minutes Intravenous Every 8 hours 01/31/14 1029     01/30/14 2100  acyclovir (ZOVIRAX) 390 mg in dextrose 5 % 100 mL IVPB  Status:   Discontinued     10 mg/kg  39.1 kg 107.8 mL/hr over 60 Minutes Intravenous Every 24 hours 01/30/14 2018 02/01/14 0908   01/29/14 1600  acyclovir (ZOVIRAX) 420 mg in dextrose 5 % 100 mL IVPB  Status:  Discontinued     420 mg 108.4 mL/hr over 60 Minutes Intravenous Every 24 hours 01/29/14 1457 01/29/14 2015   01/29/14 0800  cefTRIAXone (ROCEPHIN) 1 g in dextrose 5 % 50 mL IVPB  Status:  Discontinued     1 g 100 mL/hr over 30 Minutes Intravenous Every 24 hours 01/29/14 0707 01/29/14 0710   01/29/14 0800  vancomycin (VANCOCIN) 500 mg in sodium chloride 0.9 % 100 mL IVPB  Status:  Discontinued     500 mg 100 mL/hr over 60 Minutes Intravenous Every 48 hours 01/29/14 0719 01/31/14 1029   01/29/14 0730  piperacillin-tazobactam (ZOSYN) IVPB 2.25 g  Status:  Discontinued     2.25 g 100 mL/hr over 30 Minutes Intravenous 4 times per day 01/29/14 0719 01/31/14 1028   01/29/14 0715  azithromycin (ZITHROMAX) 500 mg in dextrose 5 % 250 mL IVPB  Status:  Discontinued     500 mg 250 mL/hr over 60 Minutes Intravenous Every 24 hours 01/29/14 0704 01/29/14 0710      Assessment: CC: AMS  PMH: HTN, marijuana abuse  AC: ([facility] d/c'd 10/23 for LP 10/24). PAS hose.  ID: Vanc/Zosyn for PNA,  Acyclovir r/o encephalitis, WBC 9.2, SCr 1.49 down- PCT 1.26. HIV came back +. (Pt went from 86lbs to 92lbs overnight??). Brain MRI not consistent with HSV encephalitis. Paraneoplastic, autoimmune encephalitis, HIV encephalitis in the differential.   CD4 T cell = 10, CD4 % helper T cell 4 CSF with elevated WBC and protein  Vanc 10/23>> Zosyn 10/23>> Acyclovir 10/24>>  10/21 Urine - NEG 10/23 Blood - pending 10/23 Cdiff - NEG 10/23 MRSA pcr (+) 10/24 BC>> 10/24 CSF>>  CV: Hx HTN - BP 178/94, HR 110- ASA300 supp, Clonidine patch, metoprolol  Endo: No hx (A1c 5.7) - AM gluc ok, TSH WNL  GI/Nutr: NPO - LFTs WNL. Profound weight loss.  Neuro: Hx marijuana use - UDS+THC, keppra IV for seizures.   Renal: Scr  down to 1.49  Pulm: RA  Heme/Onc: Hg 10, PTLC 182  Best practices: MC, f/u restart VTE px after LP   Goal of Therapy:  Vancomycin trough level 15-20 mcg/ml  Plan:  Change Zosyn 3.375 gm IV q8 hours Change Vanc 750mg  IV Q24 with improved Scr. Acyclovir 420mg  IV q12h (watch weight) with improved Scr Please consider resuming VTE prophylaxis s/p lumbar puncture.   Londin Antone S. Merilynn Finlandobertson, PharmD, [facility] CenterBCPS Clinical Staff Pharmacist Pager [phone]  Misty Stanleyobertson, Nuria Phebus Stillinger 02/01/2014,9:11 AM

## 2014-02-01 NOTE — Progress Notes (Signed)
Patient ID: Desiree Hale  female  XLK:[id number]    DOB: [birth year]    DOA: 01/27/2014  PCP: No primary provider on file.   Brief history of present illness  Patient is a 43 year old female with past medical history of hypertension, marijuana use who presented to [facility] ED 01/27/2014 after she was found unresponsive at home by a family member today. Patient was found shaking, lying on the floor, unable to speak. Patient's son at the bedside says he spoke with her over the phone at 4 PM one day prior to the admission and patient seemed to be fine. Neurology consulted. Patient was admitted for acute encephalopathy and dehydration. On 10/23, rapid response was called due to tachypnea and acute respiratory distress, chest x-ray consistent with right lung pneumonia possibly due to aspiration at the time of seizures. Patient transferred to step down unit.   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 Neurology also considering paraneoplastic syndrome/encephalitis. Patient was in her normal state of health (oriented x3, conversing) before this admission. - Spinal tap done, CSF studies showed elevated protein, glucose, lymphocytosis, on IV acyclovir - EEG negative for acute epileptiform activity - Appreciate infectious disease recommendations, cryptococcal antigen negative, CSF cultures pending, HSV PCR, EBV and CMV pending AFB, hepatitis panel negative, RPR nonreactive - CD4 count low, HIV reactive, HIV viral load [id number]   Seizures - Continue IV Keppra  High anion gap metabolic acidosis with acute renal insufficiency: Unclear etiology - Metabolic acidosis improved - Creatinine function  improving  Hypernatremia: Improving - Continue D5 solution, placed PANDA tube - Nutrition consult placed for tube feeding    HTN (hypertension) with Tachycardia - Continue IV beta blocker increased to q4hrs, placed on clonidine patch, labetalol as needed    Microcytic anemia: Hemoglobin currently stable   elevated troponins: Likely due to demand ischemia from seizures, SIRS  - Troponins trending down, 2-D echo showed EF of 55-60%, grade 1 diastolic dysfunction  - Continue IV Lopressor, aspirin pr, statin when taking oral  DVT Prophylaxis: SCD's  Code Status: FULL CODE  Family Communication: Discussed with patient's husband and daughter at the bedside  Disposition: Continue to monitor in SDU  Consultants:  Infectious disease, Dr. Ilsa IhaSnyder, Dr Ninetta LightsHatcher   Neurology, Dr. Cyril Mourningamillo  Procedures:  MRI brain    2-D echo  Spinal tap  Antibiotics:  IV vancomycin    IV Zosyn   IV acyclovir   Subjective: Patient seen and examined, somewhat more alert today, asking for food, oriented to self, still not following verbal commands  Objective: Weight change: 2.6 kg (5 lb 11.7 oz)  Intake/Output Summary (Last 24 hours) at 02/01/14 1047 Last data filed at 02/01/14 0957  Gross per 24 hour  Intake 2966.2 ml  Output    890 ml  Net 2076.2 ml   Blood pressure 178/94, pulse 110, temperature 97.8 F (36.6 C), temperature source Oral, resp. rate 29, height 5\' 7"  (1.702 m), weight 41.9 kg (92 lb 6 oz), SpO2 98.00%.  Physical Exam: General: not following verbal commands, confused, cachetic looking , more alert CVS: S1-S2 clear, no murmur rubs or gallops Chest: CTA anteriorly  Abdomen: soft nontender, nondistended, normal bowel sounds  Extremities: no cyanosis, clubbing or edema  noted bilaterally Neuro:  not following commands   Lab Results: Basic Metabolic Panel:  Recent Labs Lab 01/31/14 0250 02/01/14 0501  NA 151* 144  K 3.2* 3.5*  CL 115* 111  CO2 22 20  GLUCOSE 111*  106*  BUN 55* 36*  CREATININE 1.64* 1.49*  CALCIUM 9.2 9.0   Liver Function Tests:  Recent Labs Lab 01/29/14 0845 01/30/14 0850  AST 16 11  ALT 6 5  ALKPHOS 69 52  BILITOT 0.4 0.7  PROT 7.5 6.5  ALBUMIN 2.8* 2.1*   No results found for this basename: LIPASE, AMYLASE,  in the last 168 hours  Recent Labs Lab 01/27/14 1514 01/29/14 0845  AMMONIA 21 22   CBC:  Recent Labs Lab 01/27/14 1512  01/31/14 0250 02/01/14 0501  WBC 5.6  < > 9.3 9.2  NEUTROABS 4.5  --   --   --   HGB 10.9*  < > 10.0* 9.9*  HCT 32.8*  < > 29.6* 30.4*  MCV 74.9*  < > 79.8 79.6  PLT 383  < > 182 148*  < > = values in this interval not displayed. Cardiac Enzymes:  Recent Labs Lab 01/27/14 1524  01/28/14 1500 01/28/14 1900 01/29/14 0105 01/29/14 0845  CKTOTAL 58  --   --   --   --  142  TROPONINI  --   < > 0.53* 0.52* 0.49*  --   < > = values in this interval not displayed. BNP: No components found with this basename: POCBNP,  CBG:  Recent Labs Lab 01/31/14 0831 01/31/14 1248 01/31/14 1618 01/31/14 2005 02/01/14 0741  GLUCAP 110* 96 105* 103* 112*     Micro Results: Recent Results (from the past 240 hour(s))  URINE CULTURE     Status: None   Collection Time    01/27/14  3:26 PM      Result Value Ref Range Status   Specimen Description URINE, CATHETERIZED   Final   Special Requests NONE   Final   Culture  Setup Time     Final   Value: 01/27/2014 22:22     Performed at [facility] Count     Final   Value: NO GROWTH     Performed at [facility]   Culture     Final   Value: NO GROWTH     Performed at [facility]   Report Status 01/28/2014 FINAL   Final  CULTURE, BLOOD (ROUTINE X 2)     Status: None   Collection Time    01/29/14  9:19 AM      Result Value Ref Range Status   Specimen Description BLOOD LEFT ARM   Final   Special Requests BOTTLES DRAWN AEROBIC AND ANAEROBIC 10CC   Final   Culture  Setup Time     Final   Value:  01/29/2014 15:04     Performed at [facility]   Culture     Final   Value:        BLOOD CULTURE RECEIVED NO GROWTH TO DATE CULTURE WILL BE HELD FOR 5 DAYS BEFORE ISSUING A FINAL NEGATIVE REPORT     Performed at [facility]   Report Status PENDING   Incomplete  CULTURE, BLOOD (ROUTINE X 2)     Status: None   Collection Time    01/29/14  9:30 AM      Result Value Ref Range Status   Specimen Description BLOOD LEFT HAND  Final   Special Requests BOTTLES DRAWN AEROBIC AND ANAEROBIC 5CC   Final   Culture  Setup Time     Final   Value: 01/29/2014 15:04     Performed at [facility]   Culture     Final   Value:        BLOOD CULTURE RECEIVED NO GROWTH TO DATE CULTURE WILL BE HELD FOR 5 DAYS BEFORE ISSUING A FINAL NEGATIVE REPORT     Performed at [facility]   Report Status PENDING   Incomplete  CLOSTRIDIUM DIFFICILE BY PCR     Status: None   Collection Time    01/29/14 11:10 AM      Result Value Ref Range Status   C difficile by pcr NEGATIVE  NEGATIVE Final  MRSA PCR SCREENING     Status: Abnormal   Collection Time    01/29/14  3:40 PM      Result Value Ref Range Status   MRSA by PCR POSITIVE (*) NEGATIVE Final   Comment:            The GeneXpert MRSA Assay (FDA     approved for NASAL specimens     only), is one component of a     comprehensive MRSA colonization     surveillance program. It is not     intended to diagnose MRSA     infection nor to guide or     monitor treatment for     MRSA infections.     RESULT CALLED TO, READ BACK BY AND VERIFIED WITH:     Judge Stall RN 17:50 01/29/14 ([email or web address])  AFB CULTURE, BLOOD     Status: None   Collection Time    01/30/14 11:13 AM      Result Value Ref Range Status   Specimen Description BLOOD LEFT ARM   Final   Special Requests BOTTLES DRAWN AEROBIC ONLY 7CC   Final   Culture     Final   Value: CULTURE WILL BE EXAMINED FOR 6 WEEKS BEFORE ISSUING A FINAL REPORT     Performed at [facility]    Report Status PENDING   Incomplete  CSF CULTURE     Status: None   Collection Time    01/30/14  5:01 PM      Result Value Ref Range Status   Specimen Description CSF   Final   Special Requests NONE   Final   Gram Stain     Final   Value: CYTOSPIN SLIDE WBC PRESENT, PREDOMINANTLY MONONUCLEAR     NO ORGANISMS SEEN     Performed at [facility]     Performed at [facility]   Culture     Final   Value: NO GROWTH     Performed at [facility]   Report Status PENDING   Incomplete  GRAM STAIN     Status: None   Collection Time    01/30/14  5:01 PM      Result Value Ref Range Status   Specimen Description CSF   Final   Special Requests NONE   Final   Gram Stain     Final   Value: CYTOSPIN SLIDE     WBC PRESENT, PREDOMINANTLY MONONUCLEAR     NO ORGANISMS SEEN   Report Status 01/30/2014 FINAL   Final    Studies/Results: Ct Head Wo Contrast  01/27/2014   CLINICAL DATA:  Found unresponsive.  Altered mental status.  EXAM:  CT HEAD WITHOUT CONTRAST  CT CERVICAL SPINE WITHOUT CONTRAST  TECHNIQUE: Multidetector CT imaging of the head and cervical spine was performed following the standard protocol without intravenous contrast. Multiplanar CT image reconstructions of the cervical spine were also generated.  COMPARISON:  None.  FINDINGS: CT HEAD FINDINGS  The ventricles are normal in size and configuration. No extra-axial fluid collections are identified. The gray-white differentiation is normal. No CT findings for acute intracranial process such as hemorrhage or infarction. No mass lesions. The brainstem and cerebellum are grossly normal.  The bony structures are intact. The paranasal sinuses and mastoid air cells are clear. The globes are intact.  CT CERVICAL SPINE FINDINGS  Normal alignment of the cervical vertebral bodies. Disc spaces and vertebral bodies are maintained. No acute fracture or abnormal prevertebral soft tissue swelling. The facets are normally aligned. The  skullbase C1 and C1-2 articulations are maintained. The dens is normal. No large disc protrusions, spinal or foraminal stenosis. The lung apices are clear. Emphysematous changes are noted. There is some air in the supraclavicular fossa possibly due to a laceration. No pneumothorax is identified.  IMPRESSION: No acute intracranial findings or skull fracture.  Normal CT examination of the cervical spine. No cervical spine fracture.   Electronically Signed   By: Loralie ChampagneMark  Gallerani M.D.   On: 01/27/2014 17:27   Ct Cervical Spine Wo Contrast  01/27/2014   CLINICAL DATA:  Found unresponsive.  Altered mental status.  EXAM: CT HEAD WITHOUT CONTRAST  CT CERVICAL SPINE WITHOUT CONTRAST  TECHNIQUE: Multidetector CT imaging of the head and cervical spine was performed following the standard protocol without intravenous contrast. Multiplanar CT image reconstructions of the cervical spine were also generated.  COMPARISON:  None.  FINDINGS: CT HEAD FINDINGS  The ventricles are normal in size and configuration. No extra-axial fluid collections are identified. The gray-white differentiation is normal. No CT findings for acute intracranial process such as hemorrhage or infarction. No mass lesions. The brainstem and cerebellum are grossly normal.  The bony structures are intact. The paranasal sinuses and mastoid air cells are clear. The globes are intact.  CT CERVICAL SPINE FINDINGS  Normal alignment of the cervical vertebral bodies. Disc spaces and vertebral bodies are maintained. No acute fracture or abnormal prevertebral soft tissue swelling. The facets are normally aligned. The skullbase C1 and C1-2 articulations are maintained. The dens is normal. No large disc protrusions, spinal or foraminal stenosis. The lung apices are clear. Emphysematous changes are noted. There is some air in the supraclavicular fossa possibly due to a laceration. No pneumothorax is identified.  IMPRESSION: No acute intracranial findings or skull  fracture.  Normal CT examination of the cervical spine. No cervical spine fracture.   Electronically Signed   By: Loralie ChampagneMark  Gallerani M.D.   On: 01/27/2014 17:27   Mr Brain Wo Contrast  01/28/2014   CLINICAL DATA:  Unresponsive. The patient had an episode with loss of consciousness. She has been aphasic since that episode despite regaining who consciousness otherwise.  EXAM: MRI HEAD WITHOUT CONTRAST  TECHNIQUE: Multiplanar, multiecho pulse sequences of the brain and surrounding structures were obtained without intravenous contrast.  COMPARISON:  CT head without contrast 01/27/2014.  FINDINGS: Abnormal cortical and subcortical T2 hyperintensity is evident in the posterior insular cortex and along the sylvian fissure bilaterally. There is abnormal signal extending into the operculum bilaterally, left greater than right. This signal abnormality extends nearly to the vertex within the precentral sulcus. There is no significant associated restricted diffusion. No  hemorrhage or mass lesion is present. There is no significant mass effect.  Flow is present in the major intracranial arteries. The globes and orbits are intact. Mild mucosal thickening is noted in the ethmoid air cells and sphenoid sinuses bilaterally.  The coronal T2 weighted images are somewhat degraded by patient motion. No definite hippocampal abnormality is evident.  IMPRESSION: 1. Diffuse cortical and subcortical signal abnormality bilaterally within the temporal and posterior frontal lobes as described. This is most likely a postictal phenomenon. There is no restricted diffusion to suggest acute or subacute ischemia. No mass lesion is evident. This could be related to elevated blood pressure similar to posterior reversible encephalopathy syndrome. The distribution is atypical. Recommend follow-up MRI of the brain without and with contrast in 2-3 weeks as the symptoms resolve. 2. Minimal sinus disease.   Electronically Signed   By: Gennette Pac M.D.    On: 01/28/2014 10:27   Dg Chest Port 1 View  01/29/2014   CLINICAL DATA:  Shortness of breath, unresponsive  EXAM: PORTABLE CHEST - 1 VIEW  COMPARISON:  01/27/2014  FINDINGS: The cardiac shadow is stable. The lungs are well aerated bilaterally. Increasing density in the medial right lung base is noted consistent with atelectasis and/or early infiltrate. No pneumothorax or sizable effusion is seen.  IMPRESSION: Increased density in the right lung base related to atelectasis/early infiltrate.   Electronically Signed   By: Alcide Clever M.D.   On: 01/29/2014 06:55   Dg Chest Portable 1 View  01/27/2014   CLINICAL DATA:  Altered mental status  EXAM: PORTABLE CHEST - 1 VIEW  COMPARISON:  None.  FINDINGS: The patient is rotated. There is no focal parenchymal opacity, pleural effusion, or pneumothorax. The heart and mediastinal contours are unremarkable.  The osseous structures are unremarkable.  IMPRESSION: No active disease.   Electronically Signed   By: Elige Ko   On: 01/27/2014 16:17    Medications: Scheduled Meds: . acyclovir  10 mg/kg Intravenous Q12H  . antiseptic oral rinse  7 mL Mouth Rinse q12n4p  . aspirin  300 mg Rectal Daily  . chlorhexidine  15 mL Mouth Rinse BID  . cloNIDine  0.2 mg Transdermal Weekly  . levETIRAcetam  500 mg Intravenous Q12H  . metoprolol  5 mg Intravenous 4 times per day  . mupirocin ointment  1 application Nasal BID  . piperacillin-tazobactam (ZOSYN)  IV  3.375 g Intravenous Q8H  . sodium chloride  3 mL Intravenous Q12H  . vancomycin  750 mg Intravenous Q24H      LOS: 5 days   Yiselle Babich M.D. [facility] 02/01/2014, 10:47 AM Pager: [phone]  If 7PM-7AM, please contact night-coverage [email or web address] Password [email or web address]

## 2014-02-01 NOTE — Evaluation (Signed)
Occupational Therapy Evaluation Patient Details Name: Desiree BeamsKristy Hale MRN: [id number] DOB: [birth year] Today's Date: 02/01/2014    History of Present Illness 43 year old female with past medical history of hypertension, pancreatic cancer (per ED resident note, but this is not noted in H&P), marijuana use who presented [facility] 01/27/2014 after she was found unresponsive at home by a family member. Patient was found shaking, lying on the floor, unable to speak.   Clinical Impression   Pt with elevated BP, limiting session to bed level, may have been pain related as pt grimaced with bed positioning, RN notified.  Prior to admission, pt was independent in ADL and IADL.  She presents today with lethargy and dependence in all ADL and mobility.  She is non verbal and did not follow commands.  Will follow acutely for trial.       Follow Up Recommendations  SNF    Equipment Recommendations       Recommendations for Other Services       Precautions / Restrictions Precautions Precautions: Fall Precaution Comments: non-communicative Restrictions Weight Bearing Restrictions: No      Mobility Bed Mobility               General bed mobility comments: Attempted re-positioning of pt in bed with pt grimacing when touched.  [facility]    Transfers                 General transfer comment: Pt with increased BP, limited activity to bed level.    Balance                                            ADL Overall ADL's : Needs assistance/impaired Eating/Feeding: NPO                                   Functional mobility during ADLs: +2 for physical assistance;Total assistance (to reposition in bed) General ADL Comments: Total assist     Vision                     Perception     Praxis      Pertinent Vitals/Pain Pain Assessment: Faces Faces Pain Scale: Hurts little more Pain Location: pt grimaces, but unable to  determine location.   Pain Descriptors / Indicators: Grimacing Pain Intervention(s): Monitored during session;Repositioned (RN made aware.)     Hand Dominance     Extremity/Trunk Assessment Upper Extremity Assessment Upper Extremity Assessment: RUE deficits/detail;LUE deficits/detail RUE Deficits / Details: no functional use observed, full PROM LUE Deficits / Details: no functional use observed, full PROM   Lower Extremity Assessment Lower Extremity Assessment: Defer to PT evaluation       Communication Communication Communication: Receptive difficulties;Expressive difficulties   Cognition Arousal/Alertness: Lethargic Behavior During Therapy: Flat affect Overall Cognitive Status: Difficult to assess Area of Impairment: Following commands;Attention   Current Attention Level: Focused               General Comments       Exercises       Shoulder Instructions      Home Living Family/patient expects to be discharged to:: Skilled nursing facility  Prior Functioning/Environment Level of Independence: Independent             OT Diagnosis: Generalized weakness;Cognitive deficits;Acute pain   OT Problem List: Decreased strength;Decreased activity tolerance;Impaired balance (sitting and/or standing);Decreased coordination;Decreased cognition;Decreased knowledge of use of DME or AE;Impaired UE functional use;Pain   OT Treatment/Interventions: Therapeutic activities;Patient/family education;Balance training;Self-care/ADL training    OT Goals(Current goals can be found in the care plan section) Acute Rehab OT Goals OT Goal Formulation: Patient unable to participate in goal setting Time For Goal Achievement: 02/15/14 Potential to Achieve Goals: Good ADL Goals Additional ADL Goal #1: Pt will roll with mod assist to assist with bed positioning and ADL. Additional ADL Goal #2: Pt will follow one step commands with  verbal and tactile cues 25% of requests. Additional ADL Goal #3: Pt will sit EOB with min assist.  OT Frequency: Min 2X/week   Barriers to D/C:            Co-evaluation              End of Session Nurse Communication: Other (comment) (elevated BP)  Activity Tolerance: Patient limited by fatigue;Treatment limited secondary to medical complications (Comment) (BP 118/105) Patient left: in bed;with call bell/phone within reach;with family/visitor present   Time: [id number] OT Time Calculation (min): 14 min Charges:  OT General Charges $OT Visit: 1 Procedure OT Evaluation $Initial OT Evaluation Tier I: 1 Procedure G-Codes:    Evern BioMayberry, Juliona Vales Lynn 02/01/2014, 11:00 AM [phone]

## 2014-02-01 NOTE — Clinical Social Work Note (Signed)
CSW attempted to contact patient spouse to discuss SNF- left message.  CSW will continue to follow.  Merlyn LotJenna Holoman, LCSWA Clinical Social Worker [phone]

## 2014-02-01 NOTE — Progress Notes (Signed)
[facility]    Date of Admission:  01/27/2014   Total days of antibiotics 4        Day 4 vanc        Day 4 Zosyn        Day 4 Acyclovir   ID: Desiree Hale is a 43 y.o. female with  presented with AMS, seizure like activity, unintentional weightloss.  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 Also with feats suggestive of aspiration PNA on chest xray. Lumber puncture done- 10/24.   Principal Problem:   Acute respiratory failure Active Problems:   Altered mental state   HTN (hypertension)   Tachycardia   Metabolic acidosis   Hyperglycemia   Acute renal failure   Microcytic anemia   Hypercalcemia   Aspiration pneumonia   Seizures  Subjective: Appears awake today but still remains lethargic, responds to voice, but does not responds to questions or commands. Still Spiking fevers on antibiotics- high- 102. 5. Moans to touch.   Medications:  . acyclovir  10 mg/kg Intravenous Q12H  . antiseptic oral rinse  7 mL Mouth Rinse q12n4p  . aspirin  300 mg Rectal Daily  . chlorhexidine  15 mL Mouth Rinse BID  . cloNIDine  0.2 mg Transdermal Weekly  . levETIRAcetam  500 mg Intravenous Q12H  . metoprolol  5 mg Intravenous Q4H  . mupirocin ointment  1 application Nasal BID  . piperacillin-tazobactam (ZOSYN)  IV  3.375 g Intravenous Q8H  . sodium chloride  3 mL Intravenous Q12H  . vancomycin  750 mg Intravenous Q24H    Objective: Vital signs in last 24 hours: Temp:  [97.8 F (36.6 C)-101.3 F (38.5 C)] 97.8 F (36.6 C) (10/26 0745) Pulse Rate:  [98-121] 110 (10/26 0535) Resp:  [22-39] 29 (10/26 0535) BP: (161-192)/(87-103) 178/94 mmHg (10/26 0745) SpO2:  [91 %-100 %] 98 % (10/26 0535) Weight:  [92 lb 6 oz (41.9 kg)] 92 lb 6 oz (41.9 kg) (10/26 0535)  Physical Exam-  General appearance: appears stated age and delirious, lethargic, moans to touch Head: Normocephalic, without obvious  abnormality, atraumatic, poor dentition, could not check for thrush, pt not co-operative, neck stiffness- hard to appreciate as pt moans to every touch.  GI: soft, moans on abdominal palpation, guarding on exam,, rectal tube draining dark liquid stool- >15850mls. Extremities: extremities normal, atraumatic, no cyanosis or edema, moves all extrem Pulses: 2+ and symmetric Skin: warm.  Lab Results  Recent Labs  01/31/14 0250 02/01/14 0501  WBC 9.3 9.2  HGB 10.0* 9.9*  HCT 29.6* 30.4*  NA 151* 144  K 3.2* 3.5*  CL 115* 111  CO2 22 20  BUN 55* 36*  CREATININE 1.64* 1.49*   Liver Panel  Recent Labs  01/30/14 0850  PROT 6.5  ALBUMIN 2.1*  AST 11  ALT 5  ALKPHOS 52  BILITOT 0.7    Microbiology: 10/21- Urine culture- NG- Final 10/23- Blood culture X2 - NGTD >>  10/24- AFB culture blood- NGTD >> 10/24- CSF gram stain- No org seen, WBC present. 10/24- CSF culture- NGTD >> 10/23 - stool studies negative for cdiff, giardia, cryptosporidium Studies/Results: Dg Abd Portable 1v  01/31/2014   CLINICAL DATA:  NG tube placement.  EXAM: PORTABLE ABDOMEN - 1 VIEW  COMPARISON:  None.  FINDINGS: Bowel gas pattern is nonobstructive. A feeding tube follows the greater curvature of the stomach and terminates in the expected location  of the antrum/pylorus.  IMPRESSION: Feeding tube terminates in the expected location of the antrum/pylorus.   Electronically Signed   By: Britta MccreedySusan  Turner M.D.   On: 01/31/2014 18:58   Dg Fluoro Guide Lumbar Puncture  01/30/2014   CLINICAL DATA:  43 year old female with fever and altered mental status. Suspicious for encephalitis.  IMPRESSION: 1. Successful uncomplicated fluoroscopic guided lumbar puncture at L3-L4 with opening pressure of 17 cm of water, yielding consistently light yellow tinged CSF.   Electronically Signed   By: Trudie Reedaniel  Entrikin M.D.   On: 01/30/2014 17:02   Assessment/Plan:  43 Y O F, recently diagnosed with HIV- AIDS- this admission, found  unconscious with ongoing encephalopathy. EEg did not show ongoing change to suggest seizure activity. 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   Acute encephalopathy- MRI- 10/22- with diffuse signal abnormality- temporal and posterior frontal lobes. CSF- Increased protein and lymphocytes. CSF Crypto, Gram stain and cultures so far negative. CSF HSV pending. Differentials- HSV encephalitis, HIV encephalitis. Seen by neurology.  - CSF VDRL- pending - CSF- CMV pending - EBV DNA by PCR pending - CSF Surgical pathology pending. - Cont Acyclovir - Will repeat blood cultures - CSF JCV- added on   Aspiration PNA- Suggested by new infiltrates - right lung on chest xray- 10/23. Legionella and Strep Ag negative. 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 CD4- 10,  HIV genotype pending. - Start MAC and PCP prophylaxis with Azithromycin and Bactrim - disclosed her HIV + positive status to the patient who appeared like she understood very briefly but was unable to tell us who else she would like us to speak with. Unclear if she has power of attorney. Will keep discussing this with her. At this point, i don't believe she has full understanding of diagnosis.  AIDs with Diarrhea- Giardia and cryptosporidium negative, GI pathogen panel pending. Differentials- Disseminated MAC, CMV colitis, HIV related enteropathy - continue to support for volume loss  Fevers : fungal and afb blood cultures collected. Will check CMV viral load, and as well as lipid profile and ferritin, rule out- HLH (hemophagocytic lymphohistiocytosis).  Severe protein-caloric malnutrition = continue with nutritional support through ng  Desiree Hale, Desiree Hale PGY-1. IMTS. Pager- [phone] 02/01/2014, 11:29 AM  ----------------------------  i have personally examined and reviewed lab results. i agree with the assessment and plan as described by Dr. Mariea ClontsEmokpae. If physical exam reveals  thrush, would add antifungal therapy of fluconazole 400mg  daily for presumed eso candidiasis.  Duke Salviaynthia B. Drue SecondSnider MD MPH [facility] [phone]

## 2014-02-02 LAB — VDRL, CSF: VDRL Quant, CSF: NONREACTIVE

## 2014-02-02 LAB — GC/CHLAMYDIA PROBE AMP
CT PROBE, AMP APTIMA: NEGATIVE
GC Probe RNA: NEGATIVE

## 2014-02-02 LAB — CBC
HCT: 31.3 % — ABNORMAL LOW (ref 36.0–46.0)
Hemoglobin: 10.3 g/dL — ABNORMAL LOW (ref 12.0–15.0)
MCH: 26.1 pg (ref 26.0–34.0)
MCHC: 32.9 g/dL (ref 30.0–36.0)
MCV: 79.4 fL (ref 78.0–100.0)
PLATELETS: 126 10*3/uL — AB (ref 150–400)
RBC: 3.94 MIL/uL (ref 3.87–5.11)
RDW: 16.6 % — AB (ref 11.5–15.5)
WBC: 10.5 10*3/uL (ref 4.0–10.5)

## 2014-02-02 LAB — GLUCOSE, CAPILLARY
GLUCOSE-CAPILLARY: 100 mg/dL — AB (ref 70–99)
GLUCOSE-CAPILLARY: 111 mg/dL — AB (ref 70–99)
GLUCOSE-CAPILLARY: 112 mg/dL — AB (ref 70–99)
GLUCOSE-CAPILLARY: 115 mg/dL — AB (ref 70–99)
GLUCOSE-CAPILLARY: 131 mg/dL — AB (ref 70–99)
Glucose-Capillary: 104 mg/dL — ABNORMAL HIGH (ref 70–99)
Glucose-Capillary: 114 mg/dL — ABNORMAL HIGH (ref 70–99)
Glucose-Capillary: 130 mg/dL — ABNORMAL HIGH (ref 70–99)

## 2014-02-02 LAB — BASIC METABOLIC PANEL
Anion gap: 14 (ref 5–15)
BUN: 32 mg/dL — ABNORMAL HIGH (ref 6–23)
CHLORIDE: 106 meq/L (ref 96–112)
CO2: 19 mEq/L (ref 19–32)
CREATININE: 1.45 mg/dL — AB (ref 0.50–1.10)
Calcium: 8.8 mg/dL (ref 8.4–10.5)
GFR calc non Af Amer: 43 mL/min — ABNORMAL LOW (ref >90)
GFR, EST AFRICAN AMERICAN: 50 mL/min — AB (ref >90)
Glucose, Bld: 110 mg/dL — ABNORMAL HIGH (ref 70–99)
Potassium: 3.6 mEq/L — ABNORMAL LOW (ref 3.7–5.3)
Sodium: 139 mEq/L (ref 137–147)

## 2014-02-02 LAB — LACTATE DEHYDROGENASE: LDH: 449 U/L — AB (ref 94–250)

## 2014-02-02 LAB — PROCALCITONIN: Procalcitonin: 0.76 ng/mL

## 2014-02-02 LAB — FERRITIN: FERRITIN: 1050 ng/mL — AB (ref 10–291)

## 2014-02-02 LAB — HIV-1 RNA ULTRAQUANT REFLEX TO GENTYP+
HIV 1 RNA QUANT: 487128 {copies}/mL — AB (ref <20)
HIV-1 RNA Quant, Log: 5.69 {Log} — ABNORMAL HIGH (ref <1.30)

## 2014-02-02 MED ORDER — POTASSIUM CL IN DEXTROSE 5% 20 MEQ/L IV SOLN
20.0000 meq | INTRAVENOUS | Status: DC
  Administered 2014-02-03: 20 meq via INTRAVENOUS
  Filled 2014-02-02 (×2): qty 1000

## 2014-02-02 MED ORDER — FLUCONAZOLE 200 MG PO TABS
400.0000 mg | ORAL_TABLET | Freq: Every day | ORAL | Status: DC
  Administered 2014-02-02 – 2014-02-03 (×2): 400 mg via ORAL
  Filled 2014-02-02 (×2): qty 2

## 2014-02-02 MED ORDER — ASPIRIN 81 MG PO CHEW
81.0000 mg | CHEWABLE_TABLET | Freq: Every day | ORAL | Status: DC
  Administered 2014-02-02 – 2014-02-23 (×22): 81 mg via ORAL
  Filled 2014-02-02 (×23): qty 1

## 2014-02-02 MED ORDER — SULFAMETHOXAZOLE-TRIMETHOPRIM 400-80 MG/5ML IV SOLN
15.0000 mg/kg/d | Freq: Three times a day (TID) | INTRAVENOUS | Status: DC
  Administered 2014-02-02 – 2014-02-03 (×3): 200 mg via INTRAVENOUS
  Filled 2014-02-02 (×7): qty 12.5

## 2014-02-02 MED ORDER — ENOXAPARIN SODIUM 30 MG/0.3ML ~~LOC~~ SOLN
30.0000 mg | SUBCUTANEOUS | Status: DC
  Administered 2014-02-02 – 2014-02-03 (×2): 30 mg via SUBCUTANEOUS
  Filled 2014-02-02 (×3): qty 0.3

## 2014-02-02 NOTE — Progress Notes (Signed)
Dr. Drue SecondSnider in to see requesting POA.

## 2014-02-02 NOTE — Clinical Social Work Note (Signed)
Clinical Social Work Department BRIEF PSYCHOSOCIAL ASSESSMENT 02/02/2014  Patient:  Desiree Hale,Desiree Hale     Account Number:  [id number]     Admit date:  01/27/2014  Clinical Social Worker:  Merlyn LotHOLOMAN,Shaneca Orne, CLINICAL SOCIAL WORKER  Date/Time:  02/02/2014 10:41 AM  Referred by:  Physician  Date Referred:  02/01/2014 Referred for  SNF Placement   Other Referral:   Interview type:  Family Other interview type:    PSYCHOSOCIAL DATA Living Status:  FAMILY Admitted from facility:   Level of care:   Primary support name:  [name] Primary support relationship to patient:  SPOUSE Degree of support available:   CSW spoke with patients husband who stated that patient no longer lives with him but lives with their daughter.  When CSW inquired where patient should be placed for SNF, husband [place]. Patient does not appear to have a high level of support from her spouse but has been living with her daughter, Consepcion HearingKnadia, for the past 6 months.    CURRENT CONCERNS Current Concerns  Post-Acute Placement   Other Concerns:    SOCIAL WORK ASSESSMENT / PLAN CSW spoke with patients husband concerning PTs recommendation for SNF.  Patients [place] agree[place]Patients husband also stated that patient does not have any Advanced Directive paperwork that he is aware of. CSW will continue to follow.   Assessment/plan status:  Psychosocial Support/Ongoing Assessment of Needs Other assessment/ plan:   FL2  PASAR   Information/referral to community resources:   [place]    PATIENT'S/FAMILY'S RESPONSE TO PLAN OF CARE: Patients husband is agreeable with plan for patient to go to SNF after DC.       Merlyn LotJenna Holoman, [facility] Center[facility]WA Clinical Social Worker [phone]

## 2014-02-02 NOTE — Progress Notes (Signed)
[facility]    Date of Admission:  01/27/2014   Total days of antibiotics 5        Day 5 vanc        Day 5 Zosyn        Day 5 Acyclovir   ID: Desiree Hale is a 43 y.o. female with  presented with AMS, seizure like activity, unintentional weightloss.  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 Also with feats suggestive of aspiration PNA on chest xray. Lumber puncture done- 10/24. CSF HSV2 detected.  Principal Problem:   Acute respiratory failure Active Problems:   Altered mental state   HTN (hypertension)   Tachycardia   Metabolic acidosis   Hyperglycemia   Acute renal failure   Microcytic anemia   Hypercalcemia   Aspiration pneumonia   Seizures  Subjective: More awake today, eyes open, some response to commands. Opens mouth, brings out her tongue to some extent. Does not vocalize or moan. Still spiking very high fevers- 103 overnight. Tachycardic.   RN reports one word response, motioning to staff to come to her.  Son is in the room and states his mom has asked about the wellbeing of her grandchildren. He states that he is one of her 6 children. She lives with his sister in [place]. Remaining family lives in [place]. He too mentions difficult to get in touch with dad. He mentioned that she told her family that she had cancer, maybe in reference to why she has been ill recently.  Medications:  . acyclovir  10 mg/kg Intravenous Q12H  . antiseptic oral rinse  7 mL Mouth Rinse q12n4p  . aspirin  300 mg Rectal Daily  . azithromycin  1,200 mg Oral Weekly  . chlorhexidine  15 mL Mouth Rinse BID  . cloNIDine  0.2 mg Transdermal Weekly  . levETIRAcetam  500 mg Intravenous Q12H  . metoprolol  5 mg Intravenous Q4H  . mupirocin ointment  1 application Nasal BID  . piperacillin-tazobactam (ZOSYN)  IV  3.375 g Intravenous Q8H  . sodium chloride  3 mL Intravenous Q12H  .  sulfamethoxazole-trimethoprim  1 tablet Oral Once per day on Mon Wed Fri  . vancomycin  750 mg Intravenous Q24H    Objective: Vital signs in last 24 hours: Temp:  [97.9 F (36.6 C)-103.3 F (39.6 C)] 102.4 F (39.1 C) (10/27 0718) Pulse Rate:  [111-115] 115 (10/27 0717) Resp:  [24-36] 32 (10/27 0717) BP: (141-180)/(88-96) 156/88 mmHg (10/27 0718) SpO2:  [87 %-95 %] 90 % (10/27 0717) Weight:  [88 lb 2.9 oz (40 kg)] 88 lb 2.9 oz (40 kg) (10/27 0400)  Physical Exam-  General appearance: appears stated age and lethargic, appears malnourished. Head: Normocephalic, without obvious abnormality, atraumatic, poor dentition, whitish plaque on lateral side of tongue, also small bullae on ant tongue- on the left side, likely from seizure activity. GI: soft, no guarding today, no hepato or splenomegaly appreciated, rectal tube draining dark liquid stool- >164ms. Extremities: extremities normal, atraumatic, no cyanosis or edema, boot on feet. Pulses: 2+ and symmetric Skin: warm.  Lab Results  Recent Labs  02/01/14 0501 02/02/14 0018  WBC 9.2 10.5  HGB 9.9* 10.3*  HCT 30.4* 31.3*  NA 144 139  K 3.5* 3.6*  CL 111 106  CO2 20 19  BUN 36* 32*  CREATININE 1.49* 1.45*   Microbiology: 10/21- Urine culture- NG- Final 10/23- Blood culture X2 -  NGTD >>  10/23 - stool studies negative for cdiff, giardia, cryptosporidium 10/23- MRSA positive. 10/24- AFB culture blood- NGTD >> 10/24- CSF gram stain- No org seen, WBC present. 10/24- CSF culture- NGTD >> 10/26- Fungal culture >> 10/27- Blood cultures X2 >>  Studies/Results: Dg Abd Portable 1v  01/31/2014   CLINICAL DATA:  NG tube placement.  EXAM: PORTABLE ABDOMEN - 1 VIEW  COMPARISON:  None.  FINDINGS: Bowel gas pattern is nonobstructive. A feeding tube follows the greater curvature of the stomach and terminates in the expected location of the antrum/pylorus.  IMPRESSION: Feeding tube terminates in the expected location of the  antrum/pylorus.   Electronically Signed   By: Curlene Dolphin M.D.   On: 01/31/2014 18:58   Dg Fluoro Guide Lumbar Puncture  01/30/2014   CLINICAL DATA:  43 year old female with fever and altered mental status. Suspicious for encephalitis.  IMPRESSION: 1. Successful uncomplicated fluoroscopic guided lumbar puncture at L3-L4 with opening pressure of 17 cm of water, yielding consistently light yellow tinged CSF.   Electronically Signed   By: Vinnie Langton M.D.   On: 01/30/2014 17:02   Assessment/Plan:  42 Y O F, recently diagnosed with HIV- AIDS- this admission, found unconscious with ongoing encephalopathy. EEg did not show ongoing change to suggest seizure activity. CSF positive for HSV 2. Placed on broad spectrum for aspiration pneumonia, and Acyclovir- for HSV encephalitis.   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 CSF- Increased protein and lymphocytes. CSF Crypto, Gram stain and cultures so far negative. CSF HSV 2 detected.  - Neurology following. - CSF VDRL, CMV, EBV, JCV- pending - CSF Surgical pathology- Mixed inflammatory cells. - Continue Acyclovir  - overall, encephalopathy is slowing improving  Aspiration PNA vs. pcp- Suggested by new infiltrates - right lung on chest xray- 10/23. Legionella and Strep Ag negative. Repeat chest xray- 10/26- increased diffuse airspace disease, in addition to right lower lobe infiltrate. 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 CD4- 10,  HIV genotype pending. - Start MAC prophylaxis with Azithromycin , start treatment with Bactrim for PCP - Oral thrush- Will start Fluconazole. - disclosed her HIV + positive status to the patient- 10/27, who appeared like she understood very briefly but was unable to tell us who else she would like [place] to speak with. Unclear if she has  power of attorney. Will keep discussing this with her. At this point, i don't believe she has full understanding of diagnosis.  AIDs with Diarrhea- Giardia and cryptosporidium negative, C. Diff, GI pathogen panel negative. Differentials- Disseminated MAC, CMV colitis, HIV related enteropathy - continue to support for volume loss   Fevers : fungal and afb blood cultures collected. Will check CMV viral load. Lipid profile- TG- 120, ferritin- 1050, so far has not met criteria for [facility] (hemophagocytic lymphohistiocytosis). - repeat blood culture and fungal cultures, AFB pending. - if cmv viral load is negative, may need to consider disseminated mac as possible cause of on going fever. Fever curve trending down this morning  Severe protein-caloric malnutrition = continue with nutritional support through ng  dispo = attempt to find poa for her and also address if she would like for [place] to tell her family/children of new diagnosis of hiv.   Emokpae, [place] PGY-1. IMTS. Pager- [phone] 02/02/2014, 10:09 AM --------------------------------  i have personally examined nad reviewed results. i agree with assessment and plan as discussed/described by dr. Denton Brick.  Elzie Rings [facility] [phone]

## 2014-02-02 NOTE — Progress Notes (Signed)
Speech Language Pathology Treatment: Cognitive-Linquistic  Patient Details Name: Desiree BeamsKristy Hale MRN: [id number] DOB: 09-Feb-1971 Today's Date: 02/02/2014 Time: 1040-1100 SLP Time Calculation (min): 20 min  Assessment / Plan / Recommendation Clinical Impression  Pt. able to remain awake after aroused.  Responded with head nod to yes/no questions x 1 out of 5 attempts given max tactile and verbal cues.  Spontaneous phrases x 3 with significant dysarthria, however difficulty with volitional command following. Unable to establish consistent responses via phonation or gestures (thumb up or down) with decreased sustained attention.  ST will continue efforts for meaningful communication/cognition and address swallow during next session.   HPI HPI: 43 year old female with past medical history of hypertension, marijuana use who [facility] after she was found unresponsive at home.  Dx include acute encephalopathy, unclear etiology, probable seizure.  Paraneoplastic, autoimmune encephalitis, HIV encephalitis in the differential.  LP pending.  MRI: "Diffuse cortical and subcortical signal abnormality bilaterally within the temporal and posterior frontal lobes as described. This is most likely a postictal phenomenon. "  Clinical swallow and speech language evals completed on 10/22.  Goals established to address severe dysphagia and cogn/communication deficits.  New swallow orders received 10/25.       Pertinent Vitals Pain Assessment:  (no evidence)  SLP Plan  Continue with current plan of care    Recommendations Medication Administration: Via alternative means              Oral Care Recommendations: Oral care BID Follow up Recommendations: Skilled Nursing facility Plan: Continue with current plan of care    GO     Royce MacadamiaLitaker, Samvel Zinn Willis 02/02/2014, 1:11 PM  Breck CoonsLisa Willis Lonell FaceLitaker M.Ed [employer]-SLP Pager [phone]

## 2014-02-02 NOTE — Clinical Social Work Note (Signed)
CSW spoke with patients husband, Danne Baxternthony Zundel, concerning SNF.  Patients husband stated that [place] in [place] with her daughter, Consepcion HearingKnadia, while patients husband has been [place] in [place].  Patients husband stated that patient has been living with their daughter for 6 months because her husband works the 3rd shift.  Patients [place] agree[place].  Patients husband reported that there is no Advanced Directives that he knows of that would state a HCPOA- if no paperwork than the spouse is the lawful decision maker.  CSW will continue to follow.  Merlyn LotJenna Holoman, LCSWA Clinical Social Worker [phone]

## 2014-02-02 NOTE — Progress Notes (Signed)
NEURO HOSPITALIST PROGRESS NOTE   SUBJECTIVE:                                                                                                                        Eyes open, moans, otherwise non verbal. No further seizures noted. HSV 2 detected  CSF WBC 78, RBC 1479, protein 399, glucose 85. Gram stain negative. Cryptococcal antigen negative. CD4 count low, HIV reactive, HIV viral load 251,358.  Started on acyclovir. Keppra 500 mg BID.   OBJECTIVE:                                                                                                                           Vital signs in last 24 hours: Temp:  [97.9 F (36.6 C)-103.3 F (39.6 C)] 102.4 F (39.1 C) (10/27 0718) Pulse Rate:  [111-115] 115 (10/27 0717) Resp:  [24-36] 32 (10/27 0717) BP: (141-180)/(88-96) 156/88 mmHg (10/27 0718) SpO2:  [87 %-95 %] 90 % (10/27 0717) Weight:  [40 kg (88 lb 2.9 oz)] 40 kg (88 lb 2.9 oz) (10/27 0400)  Intake/Output from previous day: 10/26 0701 - 10/27 0700 In: 1870.6 [I.V.:1150; NG/GT:154.2; IV Piggyback:566.4] Out: 2450 [Urine:1050; Stool:1400] Intake/Output this shift: Total I/O In: -  Out: 150 [Urine:150] Nutritional status: NPO  History reviewed. No pertinent past medical history.   Neurologic Exam:  Patient is awake but is non verbal and does not follow commands.  Pupils were equal and reacted normally to light.  Extraocular movements were full on right left lateral gaze and tracks my motions in the room  Blinks to threat bilaterally.  She appear to have moderate right lower facial weakness.  Slight increase in muscle tone throughout.Resistance to passive movement was symmetrical. Withdrawal movements to noxious stimuli were equal.  Responses to sensory testing unreliable.  Deep tendon reflexes were 1+ and symmetrical.  Plantar responses were flexor bilaterally    Lab Results: Lab Results  Component Value Date/Time   CHOL 120  02/01/2014  5:01 AM   Lipid Panel  Recent Labs  02/01/14 0501  CHOL 120  TRIG 118  HDL 46  CHOLHDL 2.6  VLDL 24  LDLCALC 50    Studies/Results: Dg Chest Port 1 View  02/01/2014   CLINICAL  DATA:  Fever, aspiration pneumonia.  EXAM: PORTABLE CHEST - 1 VIEW  COMPARISON:  01/29/2014.  FINDINGS: Feeding tube is followed into the stomach with the tip projecting beyond the inferior margin of the image. Trachea is midline. Heart size stable. There is worsening bilateral airspace disease, somewhat perihilar predominant, right greater than left. No definite pleural fluid. No pneumothorax.  IMPRESSION: Worsening bilateral airspace disease, somewhat perihilar predominant, right greater than left, suspicious for pneumonia. Aspiration not excluded.   Electronically Signed   By: Leanna BattlesMelinda  Blietz M.D.   On: 02/01/2014 15:26   Dg Abd Portable 1v  01/31/2014   CLINICAL DATA:  NG tube placement.  EXAM: PORTABLE ABDOMEN - 1 VIEW  COMPARISON:  None.  FINDINGS: Bowel gas pattern is nonobstructive. A feeding tube follows the greater curvature of the stomach and terminates in the expected location of the antrum/pylorus.  IMPRESSION: Feeding tube terminates in the expected location of the antrum/pylorus.   Electronically Signed   By: Britta MccreedySusan  Turner M.D.   On: 01/31/2014 18:58    MEDICATIONS                                                                                                                        Scheduled: . acyclovir  10 mg/kg Intravenous Q12H  . antiseptic oral rinse  7 mL Mouth Rinse q12n4p  . aspirin  300 mg Rectal Daily  . azithromycin  1,200 mg Oral Weekly  . chlorhexidine  15 mL Mouth Rinse BID  . cloNIDine  0.2 mg Transdermal Weekly  . levETIRAcetam  500 mg Intravenous Q12H  . metoprolol  5 mg Intravenous Q4H  . mupirocin ointment  1 application Nasal BID  . piperacillin-tazobactam (ZOSYN)  IV  3.375 g Intravenous Q8H  . sodium chloride  3 mL Intravenous Q12H  .  sulfamethoxazole-trimethoprim  1 tablet Oral Once per day on Mon Wed Fri  . vancomycin  750 mg Intravenous Q24H    ASSESSMENT/PLAN:                                                                                                            43 y/o with new onset encephalopathy and probable seizure. Profound weight loss.  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 Will continue Acyclovir for now pending further ID review Paraneoplastic, autoimmune encephalitis, HIV encephalitis in the differential.  No subsequent seizure activity noted Will continue to follow   Elspeth Choeter Oriah Leinweber, DO [facility] [phone]  If 7pm- 7am, please page neurology on call as listed in AMION.   02/02/2014, 7:53 AM

## 2014-02-02 NOTE — Care Management Note (Addendum)
    Page 1 of 2   02/19/2014     3:50:49 PM CARE MANAGEMENT NOTE 02/19/2014  Patient:  Desiree Hale, Desiree Hale   Account Number:  [id number]  Date Initiated:  01/28/2014  Documentation initiated by:  Lorne Skeens  Subjective/Objective Assessment:   Patient was admitted with seizures.  Admitted from home.     Action/Plan:   Will follow for discharge needs pending PT/OT evals and physician orders.   Anticipated DC Date:  02/22/2014   Anticipated DC Plan:  [place]  In-house referral  Clinical Social Worker  Development worker, community      DC Planning Services  CM consult      Choice offered to / List presented to:  C-1 Patient   DME arranged  [place]      DME agency  [place] arranged  [place] RN      [place].   Status of service:  In process, will continue to follow Medicare Important Message given?   (If response is "NO", the following Medicare IM given date fields will be blank) Date Medicare IM given:   Medicare IM given by:   Date Additional Medicare IM given:   Additional Medicare IM given by:    Discharge Disposition:    Per UR Regulation:  Reviewed for med. necessity/level of care/duration of stay  If discussed at [place] of Stay Meetings, dates discussed:   02/02/2014    Comments:  02/19/14 Sharolyn Douglas met with SSW to discuss dc planning. pt wants to go home to son Elberta Fortis) house [place] phone # [phone]. Met with pt to discuss dc needs, pt requesting rolling walker, bsc, wheelchair & hoyer lift. Discussed service options. All choices for [place] and DME given to pt, and choice was for [facility] D[facility] as family has used this agency in past. Call to [place] and discussed case will need RN & HHA as MA pt. Call to [facility] with advanced DME and informed of DME needs. Call to [place] [facility]  [place] and pt will not be ready for dc until Monday as awaiting ADAP approval. discussed with pt.    02/02/14 [place] with dtr, Hilary Hertz [phone]), who states pt does not have [place] POA.  She also states that pt has been living with her recently and she would like to become [facility] POA when pt's mentation permits.  Pt's husband is living in [place], dtr lives in [place].

## 2014-02-02 NOTE — Progress Notes (Signed)
Patient ID: Desiree BeamsKristy Hale  female  ZOX:[id number]    DOB: Jan 27, 1971    DOA: 01/27/2014  PCP: No primary provider on file.   Brief history of present illness  Patient is a 43 year old female with past medical history of hypertension, marijuana use who [facility] after she was found unresponsive at home by a family member today. Patient was found shaking, lying on the floor, unable to speak. Patient's son at the bedside says he spoke with her over the phone at 4 PM one day prior to the admission and patient seemed to be fine. Neurology consulted. Patient was admitted for acute encephalopathy and dehydration. On 10/23, rapid response was called due to fevers, tachypnea and acute respiratory distress, chest x-ray consistent with right lung pneumonia possibly due to aspiration at the time of seizures. Patient transferred to step down unit. Patient was started on vancomycin and Zosyn. EEG was negative. Infectious disease was consulted. Patient underwent LP, CSF studies showed elevated protein, glucose, lymphocytosis, patient was started on acyclovir. Further workup showed positive HIV with low CD4 count and high viral load. CSF HSV 2 detected.    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 - Patient was in her normal state of health (oriented x3, conversing) before this admission. - Spinal tap done, CSF studies showed elevated protein, glucose, lymphocytosis, on IV acyclovir - EEG negative for acute epileptiform activity - Appreciate infectious disease recommendations, cryptococcal antigen negative, CSF VDRL EBV and CMV pending AFB, hepatitis panel negative, RPR nonreactive  HIV  AIDS- new diagnosis - CD4 count low, HIV reactive, HIV viral load [phone] - ID following, started on fluconazole for thrush, MAC and PCP prophylaxis with Azithromycin and Bactrim - ID disclosed her HIV status to the patient, not sure if she understood given her encephalopathy  Seizures - Continue IV Keppra  High anion gap metabolic acidosis with acute renal insufficiency: Unclear etiology - Metabolic acidosis improved - Creatinine function improving  Hypernatremia: Improving - Continue D5 solution, decreased to 50cc/hr placed PANDA tube - Continue tube feeds    HTN (hypertension) with Tachycardia - Continue IV beta blocker increased to q4hrs, placed on clonidine patch, labetalol as needed    Microcytic anemia: Hemoglobin currently stable   elevated troponins: Likely due to demand ischemia from seizures, SIRS  - Troponins trending down, 2-D echo showed EF of 55-60%, grade 1 diastolic dysfunction  - Continue IV Lopressor, aspirin pr, statin when taking oral  DVT Prophylaxis: SCD's  Code Status: FULL CODE  Family Communication: Discussed with patient's husband and daughter at the bedside yesterday  Disposition: Continue to monitor in SDU  Consultants:  Infectious disease, Dr. Ilsa IhaSnyder, Dr Ninetta LightsHatcher   Neurology, Dr. Cyril Mourningamillo  Procedures:  MRI brain    2-D echo  Spinal tap  Antibiotics:  IV vancomycin    IV Zosyn   IV acyclovir   Subjective: Still lethargic, not following commands   Objective: Weight change: -1.9 kg (-4 lb 3 oz)  Intake/Output Summary (Last 24 hours) at 02/02/14 1050 Last data filed at 02/02/14 0721  Gross per 24 hour  Intake 1362.17 ml  Output   2450 ml  Net -1087.83 ml   Blood pressure 156/88, pulse 115, temperature  102.4 F (39.1 C), temperature source Oral, resp. rate 32, height 5\' 7"  (1.702 m), weight 40 kg (88 lb 2.9 oz), SpO2 90.00%.  Physical Exam: General: not following verbal commands, cachetic looki, lethargic  S1-S2 clear, no  murmur rubs or gallops Chest: CTA anteriorly  Abdomen: soft nontender, nondistended, normal bowel sounds  Extremities: no cyanosis, clubbing or edema noted bilaterally Neuro:  not following commands   Lab Results: Basic Metabolic Panel:  Recent Labs Lab 02/01/14 0501 02/02/14 0018  NA 144 139  K 3.5* 3.6*  CL 111 106  CO2 20 19  GLUCOSE 106* 110*  BUN 36* 32*  CREATININE 1.49* 1.45*  CALCIUM 9.0 8.8   Liver Function Tests:  Recent Labs Lab 01/29/14 0845 01/30/14 0850  AST 16 11  ALT 6 5  ALKPHOS 69 52  BILITOT 0.4 0.7  PROT 7.5 6.5  ALBUMIN 2.8* 2.1*   No results found for this basename: LIPASE, AMYLASE,  in the last 168 hours  Recent Labs Lab 01/27/14 1514 01/29/14 0845  AMMONIA 21 22   CBC:  Recent Labs Lab 01/27/14 1512  02/01/14 0501 02/02/14 0018  WBC 5.6  < > 9.2 10.5  NEUTROABS 4.5  --   --   --   HGB 10.9*  < > 9.9* 10.3*  HCT 32.8*  < > 30.4* 31.3*  MCV 74.9*  < > 79.6 79.4  PLT 383  < > 148* 126*  < > = values in this interval not displayed. Cardiac Enzymes:  Recent Labs Lab 01/27/14 1524  01/28/14 1500 01/28/14 1900 01/29/14 0105 01/29/14 0845  CKTOTAL 58  --   --   --   --  142  TROPONINI  --   < > 0.53* 0.52* 0.49*  --   < > = values in this interval not displayed. BNP: No components found with this basename: POCBNP,  CBG:  Recent Labs Lab 02/01/14 1559 02/01/14 2100 02/02/14 0006 02/02/14 0357 02/02/14 0715  GLUCAP 122* 132* 114* 100* 111*     Micro Results: Recent Results (from the past 240 hour(s))  URINE CULTURE     Status: None   Collection Time    01/27/14  3:26 PM      Result Value Ref Range Status   Specimen Description URINE, CATHETERIZED   Final   Special Requests NONE   Final   Culture  Setup Time     Final   Value: 01/27/2014 22:22     Performed at [facility] Count     Final   Value: NO GROWTH     Performed at [facility]   Culture     Final   Value: NO GROWTH      Performed at [facility]   Report Status 01/28/2014 FINAL   Final  CULTURE, BLOOD (ROUTINE X 2)     Status: None   Collection Time    01/29/14  9:19 AM      Result Value Ref Range Status   Specimen Description BLOOD LEFT ARM   Final   Special Requests BOTTLES DRAWN AEROBIC AND ANAEROBIC 10CC   Final   Culture  Setup Time     Final   Value: 01/29/2014 15:04     Performed at [facility]   Culture     Final   Value:        BLOOD CULTURE RECEIVED NO GROWTH TO DATE CULTURE WILL BE HELD FOR 5 DAYS BEFORE ISSUING  A FINAL NEGATIVE REPORT     Performed at [facility]   Report Status PENDING   Incomplete  CULTURE, BLOOD (ROUTINE X 2)     Status: None   Collection Time    01/29/14  9:30 AM      Result Value Ref Range Status   Specimen Description BLOOD LEFT HAND   Final   Special Requests BOTTLES DRAWN AEROBIC AND ANAEROBIC 5CC   Final   Culture  Setup Time     Final   Value: 01/29/2014 15:04     Performed at [facility]   Culture     Final   Value:        BLOOD CULTURE RECEIVED NO GROWTH TO DATE CULTURE WILL BE HELD FOR 5 DAYS BEFORE ISSUING A FINAL NEGATIVE REPORT     Performed at [facility]   Report Status PENDING   Incomplete  CLOSTRIDIUM DIFFICILE BY PCR     Status: None   Collection Time    01/29/14 11:10 AM      Result Value Ref Range Status   C difficile by pcr NEGATIVE  NEGATIVE Final  MRSA PCR SCREENING     Status: Abnormal   Collection Time    01/29/14  3:40 PM      Result Value Ref Range Status   MRSA by PCR POSITIVE (*) NEGATIVE Final   Comment:            The GeneXpert MRSA Assay (FDA     approved for NASAL specimens     only), is one component of a     comprehensive MRSA colonization     surveillance program. It is not     intended to diagnose MRSA     infection nor to guide or     monitor treatment for     MRSA infections.     RESULT CALLED TO, READ BACK BY AND VERIFIED WITH:     Judge Stall RN 17:50 01/29/14 ([email or web address])    AFB CULTURE, BLOOD     Status: None   Collection Time    01/30/14 11:13 AM      Result Value Ref Range Status   Specimen Description BLOOD LEFT ARM   Final   Special Requests BOTTLES DRAWN AEROBIC ONLY 7CC   Final   Culture     Final   Value: CULTURE WILL BE EXAMINED FOR 6 WEEKS BEFORE ISSUING A FINAL REPORT     Performed at [facility]   Report Status PENDING   Incomplete  GC/CHLAMYDIA PROBE AMP     Status: None   Collection Time    01/30/14 11:30 AM      Result Value Ref Range Status   CT Probe RNA NEGATIVE  NEGATIVE Final   GC Probe RNA NEGATIVE  NEGATIVE Final   Comment: (NOTE)                                                                                               **Normal Reference Range: Negative**          Assay performed using  the Gen-Probe APTIMA COMBO2 (R) Assay.     Acceptable specimen types for this assay include APTIMA Swabs (Unisex,     endocervical, urethral, or vaginal), first void urine, and ThinPrep     liquid based cytology samples.     Performed at [facility]  CSF CULTURE     Status: None   Collection Time    01/30/14  5:01 PM      Result Value Ref Range Status   Specimen Description CSF   Final   Special Requests NONE   Final   Gram Stain     Final   Value: CYTOSPIN SLIDE WBC PRESENT, PREDOMINANTLY MONONUCLEAR     NO ORGANISMS SEEN     Performed at [facility]     Performed at [facility]   Culture     Final   Value: NO GROWTH 1 DAY     Performed at [facility]   Report Status PENDING   Incomplete  GRAM STAIN     Status: None   Collection Time    01/30/14  5:01 PM      Result Value Ref Range Status   Specimen Description CSF   Final   Special Requests NONE   Final   Gram Stain     Final   Value: CYTOSPIN SLIDE     WBC PRESENT, PREDOMINANTLY MONONUCLEAR     NO ORGANISMS SEEN   Report Status 01/30/2014 FINAL   Final    Studies/Results: Ct Head Wo Contrast  01/27/2014   CLINICAL DATA:  Found  unresponsive.  Altered mental status.  EXAM: CT HEAD WITHOUT CONTRAST  CT CERVICAL SPINE WITHOUT CONTRAST  TECHNIQUE: Multidetector CT imaging of the head and cervical spine was performed following the standard protocol without intravenous contrast. Multiplanar CT image reconstructions of the cervical spine were also generated.  COMPARISON:  None.  FINDINGS: CT HEAD FINDINGS  The ventricles are normal in size and configuration. No extra-axial fluid collections are identified. The gray-white differentiation is normal. No CT findings for acute intracranial process such as hemorrhage or infarction. No mass lesions. The brainstem and cerebellum are grossly normal.  The bony structures are intact. The paranasal sinuses and mastoid air cells are clear. The globes are intact.  CT CERVICAL SPINE FINDINGS  Normal alignment of the cervical vertebral bodies. Disc spaces and vertebral bodies are maintained. No acute fracture or abnormal prevertebral soft tissue swelling. The facets are normally aligned. The skullbase C1 and C1-2 articulations are maintained. The dens is normal. No large disc protrusions, spinal or foraminal stenosis. The lung apices are clear. Emphysematous changes are noted. There is some air in the supraclavicular fossa possibly due to a laceration. No pneumothorax is identified.  IMPRESSION: No acute intracranial findings or skull fracture.  Normal CT examination of the cervical spine. No cervical spine fracture.   Electronically Signed   By: Loralie Champagne M.D.   On: 01/27/2014 17:27   Ct Cervical Spine Wo Contrast  01/27/2014   CLINICAL DATA:  Found unresponsive.  Altered mental status.  EXAM: CT HEAD WITHOUT CONTRAST  CT CERVICAL SPINE WITHOUT CONTRAST  TECHNIQUE: Multidetector CT imaging of the head and cervical spine was performed following the standard protocol without intravenous contrast. Multiplanar CT image reconstructions of the cervical spine were also generated.  COMPARISON:  None.   FINDINGS: CT HEAD FINDINGS  The ventricles are normal in size and configuration. No extra-axial fluid collections are identified. The gray-white differentiation is normal.  No CT findings for acute intracranial process such as hemorrhage or infarction. No mass lesions. The brainstem and cerebellum are grossly normal.  The bony structures are intact. The paranasal sinuses and mastoid air cells are clear. The globes are intact.  CT CERVICAL SPINE FINDINGS  Normal alignment of the cervical vertebral bodies. Disc spaces and vertebral bodies are maintained. No acute fracture or abnormal prevertebral soft tissue swelling. The facets are normally aligned. The skullbase C1 and C1-2 articulations are maintained. The dens is normal. No large disc protrusions, spinal or foraminal stenosis. The lung apices are clear. Emphysematous changes are noted. There is some air in the supraclavicular fossa possibly due to a laceration. No pneumothorax is identified.  IMPRESSION: No acute intracranial findings or skull fracture.  Normal CT examination of the cervical spine. No cervical spine fracture.   Electronically Signed   By: Loralie ChampagneMark  Gallerani M.D.   On: 01/27/2014 17:27   Mr Brain Wo Contrast  01/28/2014   CLINICAL DATA:  Unresponsive. The patient had an episode with loss of consciousness. She has been aphasic since that episode despite regaining who consciousness otherwise.  EXAM: MRI HEAD WITHOUT CONTRAST  TECHNIQUE: Multiplanar, multiecho pulse sequences of the brain and surrounding structures were obtained without intravenous contrast.  COMPARISON:  CT head without contrast 01/27/2014.  FINDINGS: Abnormal cortical and subcortical T2 hyperintensity is evident in the posterior insular cortex and along the sylvian fissure bilaterally. There is abnormal signal extending into the operculum bilaterally, left greater than right. This signal abnormality extends nearly to the vertex within the precentral sulcus. There is no significant  associated restricted diffusion. No hemorrhage or mass lesion is present. There is no significant mass effect.  Flow is present in the major intracranial arteries. The globes and orbits are intact. Mild mucosal thickening is noted in the ethmoid air cells and sphenoid sinuses bilaterally.  The coronal T2 weighted images are somewhat degraded by patient motion. No definite hippocampal abnormality is evident.  IMPRESSION: 1. Diffuse cortical and subcortical signal abnormality bilaterally within the temporal and posterior frontal lobes as described. This is most likely a postictal phenomenon. There is no restricted diffusion to suggest acute or subacute ischemia. No mass lesion is evident. This could be related to elevated blood pressure similar to posterior reversible encephalopathy syndrome. The distribution is atypical. Recommend follow-up MRI of the brain without and with contrast in 2-3 weeks as the symptoms resolve. 2. Minimal sinus disease.   Electronically Signed   By: Gennette Pachris  Mattern M.D.   On: 01/28/2014 10:27   Dg Chest Port 1 View  01/29/2014   CLINICAL DATA:  Shortness of breath, unresponsive  EXAM: PORTABLE CHEST - 1 VIEW  COMPARISON:  01/27/2014  FINDINGS: The cardiac shadow is stable. The lungs are well aerated bilaterally. Increasing density in the medial right lung base is noted consistent with atelectasis and/or early infiltrate. No pneumothorax or sizable effusion is seen.  IMPRESSION: Increased density in the right lung base related to atelectasis/early infiltrate.   Electronically Signed   By: Alcide CleverMark  Lukens M.D.   On: 01/29/2014 06:55   Dg Chest Portable 1 View  01/27/2014   CLINICAL DATA:  Altered mental status  EXAM: PORTABLE CHEST - 1 VIEW  COMPARISON:  None.  FINDINGS: The patient is rotated. There is no focal parenchymal opacity, pleural effusion, or pneumothorax. The heart and mediastinal contours are unremarkable.  The osseous structures are unremarkable.  IMPRESSION: No active  disease.   Electronically Signed   By: Alan RipperHetal  Patel   On: 01/27/2014 16:17    Medications: Scheduled Meds: . acyclovir  10 mg/kg Intravenous Q12H  . antiseptic oral rinse  7 mL Mouth Rinse q12n4p  . aspirin  300 mg Rectal Daily  . azithromycin  1,200 mg Oral Weekly  . chlorhexidine  15 mL Mouth Rinse BID  . cloNIDine  0.2 mg Transdermal Weekly  . fluconazole  400 mg Oral Daily  . levETIRAcetam  500 mg Intravenous Q12H  . metoprolol  5 mg Intravenous Q4H  . mupirocin ointment  1 application Nasal BID  . piperacillin-tazobactam (ZOSYN)  IV  3.375 g Intravenous Q8H  . sodium chloride  3 mL Intravenous Q12H  . sulfamethoxazole-trimethoprim  1 tablet Oral Once per day on Mon Wed Fri  . vancomycin  750 mg Intravenous Q24H      LOS: 6 days   RAI,RIPUDEEP M.D. [facility] 02/02/2014, 10:50 AM Pager: [phone]  If 7PM-7AM, please contact night-coverage [email or web address] Password [email or web address]

## 2014-02-02 NOTE — Progress Notes (Signed)
Son, Ethelene Brownsnthony here to visit and name put on board.

## 2014-02-02 NOTE — Progress Notes (Signed)
ANTIBIOTIC CONSULT NOTE - FOLLOW UP  Pharmacy Consult for Bactrim Indication: PCP  Allergies  Allergen Reactions  . Oxycodone Other (See Comments)   Patient Measurements: Height: 5\' 7"  (170.2 cm) Weight: 88 lb 2.9 oz (40 kg) IBW/kg (Calculated) : 61.6  Vital Signs: Temp: 97.8 F (36.6 C) (10/27 1125) Temp Source: Oral (10/27 1125) BP: 124/75 mmHg (10/27 1125) Pulse Rate: 127 (10/27 1125) Intake/Output from previous day: 10/26 0701 - 10/27 0700 In: 1870.6 [I.V.:1150; NG/GT:154.2; IV Piggyback:566.4] Out: 2450 [Urine:1050; Stool:1400] Intake/Output from this shift: Total I/O In: -  Out: 150 [Urine:150]  Labs:  Recent Labs  01/31/14 0250 02/01/14 0501 02/02/14 0018  WBC 9.3 9.2 10.5  HGB 10.0* 9.9* 10.3*  PLT 182 148* 126*  CREATININE 1.64* 1.49* 1.45*   Estimated Creatinine Clearance: 31.3 ml/min (by C-G formula based on Cr of 1.45).   Assessment: 43yof with newly diagnosed HIV continues to spike fevers despite being on vancomycin, zosyn, and acyclovir. She was started on azithromycin/bactrim for MAC/PCP prophylaxis yesterday. CXR shows worsening bilateral airspace disease. Bactrim will be changed to treatment dose to cover for PCP. Her CrCl is borderline for for q8 to q12 dosing, but since her sCr appears to be improving will initiate q8.   Vancomycin 10/23>> Zosyn 10/23>> Acyclovir 10/24>> PO Fluconazole (thrush) 10/27>> PO Azith (MAC px) 10/26>> PO Bactrim (PCP px) 10/26>>10/27 IV Bactrim (PCP tx) 10/27>>  10/21 Urine - NEG 10/23 Blood - ngtd 10/23 Cdiff - NEG 10/23 MRSA pcr (+) 10/24 blood>>ngtd 10/24 CSF>>ngtd 10/27 blood>>  Goal of Therapy:  Appropriate bactrim dosing  Plan:  1) Bactrim 200mg  IV q8 (5mg /kg q8) 2) Continue to follow renal function  Fredrik RiggerMarkle, Coe Angelos Sue 02/02/2014,1:47 PM

## 2014-02-02 NOTE — Clinical Social Work Placement (Signed)
Clinical Social Work Department CLINICAL SOCIAL WORK PLACEMENT NOTE 02/02/2014  Patient:  Desiree Hale,Desiree Hale  Account Number:  [id number] Admit date:  01/27/2014  Clinical Social Worker:  Merlyn LotJENNA HOLOMAN, CLINICAL SOCIAL WORKER  Date/time:  02/02/2014 12:33 PM  Clinical Social Work is seeking post-discharge placement for this patient at the following level of care:   SKILLED NURSING   (*CSW will update this form in Epic as items are completed)   02/02/2014  Patient/family provided with [facility] of Clinical Social Work's list of facilities offering this level of care within the geographic area requested by the patient (or if unable, by the patient's family).  02/02/2014  Patient/family informed of their freedom to choose among providers that offer the needed level of care, that participate in Medicare, Medicaid or managed care program needed by the patient, have an available bed and are willing to accept the patient.  02/02/2014  Patient/family informed of [facility]' ownership interest in [facility], as well as of the fact that they are under no obligation to receive care at this facility.  PASARR submitted to EDS on 02/02/2014 PASARR number received on 02/02/2014  FL2 transmitted to all facilities in geographic area requested by pt/family on  02/02/2014 FL2 transmitted to all facilities within larger geographic area on   Patient informed that his/her managed care company has contracts with or will negotiate with  certain facilities, including the following:     Patient/family informed of bed offers received:   Patient chooses bed at  Physician recommends and patient chooses bed at    Patient to be transferred to  on   Patient to be transferred to facility by  Patient and family notified of transfer on  Name of family member notified:    The following physician request were entered in Epic:   Additional Comments:   Merlyn LotJenna Holoman, [facility] Clinical Social  Worker [phone]

## 2014-02-03 ENCOUNTER — Encounter (HOSPITAL_COMMUNITY): Payer: Self-pay | Admitting: Adult Health

## 2014-02-03 ENCOUNTER — Inpatient Hospital Stay (HOSPITAL_COMMUNITY): Payer: Medicaid Other

## 2014-02-03 DIAGNOSIS — J189 Pneumonia, unspecified organism: Secondary | ICD-10-CM

## 2014-02-03 DIAGNOSIS — R509 Fever, unspecified: Secondary | ICD-10-CM

## 2014-02-03 DIAGNOSIS — B004 Herpesviral encephalitis: Principal | ICD-10-CM

## 2014-02-03 LAB — CBC
HCT: 27 % — ABNORMAL LOW (ref 36.0–46.0)
Hemoglobin: 9 g/dL — ABNORMAL LOW (ref 12.0–15.0)
MCH: 25.9 pg — AB (ref 26.0–34.0)
MCHC: 33.3 g/dL (ref 30.0–36.0)
MCV: 77.8 fL — AB (ref 78.0–100.0)
PLATELETS: 157 10*3/uL (ref 150–400)
RBC: 3.47 MIL/uL — ABNORMAL LOW (ref 3.87–5.11)
RDW: 16.9 % — ABNORMAL HIGH (ref 11.5–15.5)
WBC: 12.8 10*3/uL — ABNORMAL HIGH (ref 4.0–10.5)

## 2014-02-03 LAB — URINALYSIS, ROUTINE W REFLEX MICROSCOPIC
Bilirubin Urine: NEGATIVE
Glucose, UA: NEGATIVE mg/dL
Ketones, ur: NEGATIVE mg/dL
Leukocytes, UA: NEGATIVE
Nitrite: NEGATIVE
PROTEIN: 100 mg/dL — AB
Specific Gravity, Urine: 1.019 (ref 1.005–1.030)
UROBILINOGEN UA: 0.2 mg/dL (ref 0.0–1.0)
pH: 5.5 (ref 5.0–8.0)

## 2014-02-03 LAB — CMV (CYTOMEGALOVIRUS) DNA ULTRAQUANT, PCR

## 2014-02-03 LAB — CSF CULTURE: CULTURE: NO GROWTH

## 2014-02-03 LAB — GLUCOSE, CAPILLARY
GLUCOSE-CAPILLARY: 109 mg/dL — AB (ref 70–99)
GLUCOSE-CAPILLARY: 142 mg/dL — AB (ref 70–99)
Glucose-Capillary: 119 mg/dL — ABNORMAL HIGH (ref 70–99)
Glucose-Capillary: 102 mg/dL — ABNORMAL HIGH (ref 70–99)

## 2014-02-03 LAB — EPSTEIN BARR VRS(EBV DNA BY PCR): EBV DNA QN by PCR: <200 copies/mL

## 2014-02-03 LAB — BASIC METABOLIC PANEL
Anion gap: 16 — ABNORMAL HIGH (ref 5–15)
BUN: 37 mg/dL — ABNORMAL HIGH (ref 6–23)
CALCIUM: 8.6 mg/dL (ref 8.4–10.5)
CO2: 16 mEq/L — ABNORMAL LOW (ref 19–32)
CREATININE: 2.14 mg/dL — AB (ref 0.50–1.10)
Chloride: 102 mEq/L (ref 96–112)
GFR calc Af Amer: 31 mL/min — ABNORMAL LOW (ref >90)
GFR calc non Af Amer: 27 mL/min — ABNORMAL LOW (ref >90)
GLUCOSE: 119 mg/dL — AB (ref 70–99)
Potassium: 3.6 mEq/L — ABNORMAL LOW (ref 3.7–5.3)
Sodium: 134 mEq/L — ABNORMAL LOW (ref 137–147)

## 2014-02-03 LAB — URINE MICROSCOPIC-ADD ON: RBC / HPF: NONE SEEN RBC/hpf (ref <3)

## 2014-02-03 LAB — JC VIRUS, PCR CSF: JC VIRUS PCR (CSF): NOT DETECTED

## 2014-02-03 LAB — CSF CULTURE W GRAM STAIN

## 2014-02-03 LAB — CMV ANTIBODY, IGG (EIA): CMV Ab - IgG: >10 U/mL — ABNORMAL HIGH (ref <0.60)

## 2014-02-03 MED ORDER — DEXMEDETOMIDINE HCL IN NACL 200 MCG/50ML IV SOLN
0.0000 ug/kg/h | INTRAVENOUS | Status: DC
  Administered 2014-02-04 (×4): 0.8 ug/kg/h via INTRAVENOUS
  Administered 2014-02-04: 0.5 ug/kg/h via INTRAVENOUS
  Administered 2014-02-05: 1 ug/kg/h via INTRAVENOUS
  Administered 2014-02-05: 0.8 ug/kg/h via INTRAVENOUS
  Filled 2014-02-03 (×7): qty 50

## 2014-02-03 MED ORDER — FLUCONAZOLE 40 MG/ML PO SUSR
200.0000 mg | Freq: Every day | ORAL | Status: DC
  Administered 2014-02-04: 200 mg
  Filled 2014-02-03 (×2): qty 5

## 2014-02-03 MED ORDER — MIDAZOLAM HCL 2 MG/2ML IJ SOLN
2.0000 mg | Freq: Once | INTRAMUSCULAR | Status: AC
  Administered 2014-02-03: 2 mg via INTRAVENOUS

## 2014-02-03 MED ORDER — DEXTROSE 5 % IV SOLN
5.0000 mg/kg | Freq: Two times a day (BID) | INTRAVENOUS | Status: DC
  Administered 2014-02-03 – 2014-02-05 (×3): 200 mg via INTRAVENOUS
  Filled 2014-02-03 (×7): qty 12.5

## 2014-02-03 MED ORDER — FENTANYL CITRATE 0.05 MG/ML IJ SOLN
100.0000 ug | Freq: Once | INTRAMUSCULAR | Status: AC
  Administered 2014-02-03: 100 ug via INTRAVENOUS

## 2014-02-03 MED ORDER — FENTANYL CITRATE 0.05 MG/ML IJ SOLN
50.0000 ug | INTRAMUSCULAR | Status: DC | PRN
  Administered 2014-02-04 – 2014-02-05 (×8): 100 ug via INTRAVENOUS
  Filled 2014-02-03 (×8): qty 2

## 2014-02-03 MED ORDER — DEXTROSE 5 % IV SOLN
10.0000 mg/kg | INTRAVENOUS | Status: DC
  Administered 2014-02-04: 400 mg via INTRAVENOUS
  Filled 2014-02-03: qty 8

## 2014-02-03 MED ORDER — AZITHROMYCIN 200 MG/5ML PO SUSR: 1200.0000 mg | ORAL | Status: DC

## 2014-02-03 MED ORDER — ROCURONIUM BROMIDE 50 MG/5ML IV SOLN
50.0000 mg | Freq: Once | INTRAVENOUS | Status: AC
  Administered 2014-02-03: 50 mg via INTRAVENOUS

## 2014-02-03 MED ORDER — METOPROLOL TARTRATE 1 MG/ML IV SOLN
2.5000 mg | INTRAVENOUS | Status: DC | PRN
  Filled 2014-02-03: qty 5

## 2014-02-03 MED ORDER — ETOMIDATE 2 MG/ML IV SOLN
20.0000 mg | Freq: Once | INTRAVENOUS | Status: AC
  Administered 2014-02-03: 20 mg via INTRAVENOUS

## 2014-02-03 MED ORDER — FLUCONAZOLE 40 MG/ML PO SUSR: 400.0000 mg | Freq: Every day | ORAL | Status: DC

## 2014-02-03 MED ORDER — HYDRALAZINE HCL 20 MG/ML IJ SOLN
10.0000 mg | INTRAMUSCULAR | Status: DC | PRN
  Administered 2014-02-06 – 2014-02-07 (×4): 20 mg via INTRAVENOUS
  Administered 2014-02-08: 40 mg via INTRAVENOUS
  Administered 2014-02-10 – 2014-02-13 (×7): 20 mg via INTRAVENOUS
  Filled 2014-02-03 (×2): qty 1
  Filled 2014-02-03: qty 2
  Filled 2014-02-03 (×4): qty 1
  Filled 2014-02-03 (×2): qty 2
  Filled 2014-02-03 (×3): qty 1

## 2014-02-03 MED ORDER — POTASSIUM CL IN DEXTROSE 5% 20 MEQ/L IV SOLN
20.0000 meq | INTRAVENOUS | Status: DC
  Administered 2014-02-03: 20 meq via INTRAVENOUS
  Filled 2014-02-03 (×3): qty 1000

## 2014-02-03 MED ORDER — DEXTROSE-NACL 5-0.9 % IV SOLN
INTRAVENOUS | Status: DC
  Administered 2014-02-03 – 2014-02-06 (×5): via INTRAVENOUS

## 2014-02-03 MED ORDER — AZITHROMYCIN 600 MG PO TABS: 1200.0000 mg | ORAL_TABLET | ORAL | Status: DC

## 2014-02-03 MED ORDER — FLUCONAZOLE 200 MG PO TABS: 400.0000 mg | ORAL_TABLET | Freq: Every day | ORAL | Status: DC

## 2014-02-03 MED ORDER — FENTANYL CITRATE 0.05 MG/ML IJ SOLN
INTRAMUSCULAR | Status: AC
  Filled 2014-02-03: qty 4

## 2014-02-03 MED ORDER — PANTOPRAZOLE SODIUM 40 MG PO PACK
40.0000 mg | PACK | Freq: Every day | ORAL | Status: DC
  Administered 2014-02-04 – 2014-02-05 (×2): 40 mg
  Filled 2014-02-03 (×2): qty 20

## 2014-02-03 MED ORDER — MIDAZOLAM HCL 2 MG/2ML IJ SOLN
INTRAMUSCULAR | Status: AC
  Filled 2014-02-03: qty 4

## 2014-02-03 MED ORDER — PIPERACILLIN-TAZOBACTAM IN DEX 2-0.25 GM/50ML IV SOLN
2.2500 g | Freq: Three times a day (TID) | INTRAVENOUS | Status: DC
  Administered 2014-02-03 – 2014-02-04 (×3): 2.25 g via INTRAVENOUS
  Filled 2014-02-03 (×5): qty 50

## 2014-02-03 NOTE — Procedures (Addendum)
Intubation Procedure Note Beryle BeamsKristy Bouza [id number] February 11, 1971  Procedure: Intubation Indications: Respiratory insufficiency  Procedure Details Consent: Risks of procedure as well as the alternatives and risks of each were explained to the (patient/caregiver).  Consent for procedure obtained. Time Out: Verified patient identification, verified procedure, site/side was marked, verified correct patient position, special equipment/implants available, medications/allergies/relevent history reviewed, required imaging and test results available.  Performed  Maximum sterile technique was used including gloves, gown, hand hygiene and mask.  MAC and 3  4mg  versed, 200 mcg fent, etomidate 20 mg , rocuronium 50 mg used in divided doses Initially 7.0 ETT passed, then changed over bougie to 7.5 ETT  Evaluation Hemodynamic Status: BP stable throughout; O2 sats: stable throughout Patient's Current Condition: stable Complications: No apparent complications Patient did tolerate procedure well. Chest X-ray ordered to verify placement.  CXR: pending.  Procedure performed by Celine Mansesai NP uner my direct supervision  Oretha MilchALVA,RAKESH V. MD 02/03/2014

## 2014-02-03 NOTE — Progress Notes (Signed)
ANTIBIOTIC CONSULT NOTE - FOLLOW UP  Pharmacy Consult for Bactrim, Acyclovir, Zosyn, Fluconazole Indication: PCP  Allergies  Allergen Reactions  . Oxycodone Other (See Comments)   Patient Measurements: Height: 5\' 7"  (170.2 cm) Weight: 88 lb 2.9 oz (40 kg) IBW/kg (Calculated) : 61.6  Vital Signs: Temp: 98 F (36.7 C) (10/28 1141) Temp Source: Oral (10/28 1141) BP: 152/92 mmHg (10/28 1345) Pulse Rate: 117 (10/28 1345) Intake/Output from previous day: 10/27 0701 - 10/28 0700 In: 2305.2 [I.V.:900; NG/GT:542.7; IV Piggyback:862.5] Out: 2425 [Urine:1625; Stool:800] Intake/Output from this shift: Total I/O In: 674.2 [I.V.:216.7; NG/GT:70; IV Piggyback:387.5] Out: 325 [Urine:325]  Labs:  Recent Labs  02/01/14 0501 02/02/14 0018 02/03/14 1210  WBC 9.2 10.5 12.8*  HGB 9.9* 10.3* 9.0*  PLT 148* 126* 157  CREATININE 1.49* 1.45* 2.14*   Estimated Creatinine Clearance: 21.2 ml/min (by C-G formula based on Cr of 2.14).   Assessment: 43yof with newly diagnosed HIV continues to spike fevers despite being on vancomycin, zosyn, and acyclovir. She was started on azithromycin/bactrim for MAC/PCP prophylaxis yesterday. CXR shows worsening bilateral airspace disease. Bactrim will be changed to treatment dose to cover for PCP. Her is rising at 2.14 with estimated CrCl ~ 820mL/min. WBC with mild increase 10.5 >> 12.8. Tmax 102.2  Vancomycin 10/23>>10/28 Zosyn 10/23>> Acyclovir 10/24>> PO Fluconazole (thrush) 10/27>> PO Azith (MAC px) 10/26>> PO Bactrim (PCP px) 10/26>>10/27 IV Bactrim (PCP tx) 10/27>>  10/21 Urine - NEG 10/23 Blood - ngtd 10/23 Cdiff - NEG 10/23 MRSA pcr (+) 10/24 blood>>ngtd 10/24 CSF>>ngtd 10/27 blood>>  Goal of Therapy:  Appropriate bactrim dosing Clinical resolution of infection  Plan:  1) Decrease Bactrim to 200mg  IV q12 (5mg /kg q12) 2) Decrease Zosyn to 2.25g IV q8h.  3) Decrease Acyclovir to 400mg  IV q24h 4) Decrease Fluconazole to 200mg  per tube  q24h   Link SnufferJessica Miko Sirico, PharmD, BCPS Clinical Pharmacist [phone] 02/03/2014,2:13 PM

## 2014-02-03 NOTE — Consult Note (Signed)
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  Followed by neurology and ID and rx with acyclovir, PCP rx.  On 10/28 developed worsening fevers, tachypnea/resp failure and PCCM consulted for tx ICU.   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  Otherwise neg acute.  EEG 10/22>>>normal drowsy and asleep electroencephalogram. No epileptiform activity is noted.    SIGNIFICANT EVENTS:   10/28 - worsening tachypnea, AMS, tx ICU    HISTORY OF PRESENT ILLNESS:   43yo female with hx HTN, admitted 10/21 with AMS/?seizures.  LP showed HSV2.  Also with PNa, ?PCP.  New dx HIV (CD4=10).  Followed by neurology and ID and rx with acyclovir, PCP rx.  On 10/28 developed worsening fevers, tachypnea/resp failure and PCCM consulted for tx ICU.   PAST MEDICAL HISTORY :   has a past medical history of Hypertension and HIV (human immunodeficiency virus infection).  has no past surgical history on file. Prior to Admission medications   Medication Sig Start Date End Date Taking? Authorizing Provider  gabapentin (NEURONTIN) 300 MG capsule Take 600 mg by mouth 2 (two) times daily.   Yes Historical Provider, MD  Ibuprofen-Diphenhydramine HCl (ADVIL PM) 200-25 MG CAPS Take 1 tablet by mouth at bedtime as needed (for sleep and pain).   Yes Historical Provider, MD   Allergies  Allergen Reactions  . Oxycodone Other (See Comments)    FAMILY HISTORY:  has no family status information on file.  SOCIAL HISTORY:    REVIEW OF SYSTEMS:  Unable. Pt altered.   SUBJECTIVE:   VITAL SIGNS: Temp:  [97.5 F (36.4  C)-102.2 F (39 C)] 102.2 F (39 C) (10/28 1000) Pulse Rate:  [100-127] 100 (10/28 0750) Resp:  [28-40] 40 (10/28 0410) BP: (124-153)/(75-92) 144/86 mmHg (10/28 0750) SpO2:  [94 %-100 %] 99 % (10/28 0859) FiO2 (%):  [28 %] 28 % (10/28 0750) Weight:  [88 lb 2.9 oz (40 kg)] 88 lb 2.9 oz (40 kg) (10/28 0410) HEMODYNAMICS:   VENTILATOR SETTINGS: Vent Mode:  [-]  FiO2 (%):  [28 %] 28 % INTAKE / OUTPUT:  Intake/Output Summary (Last 24 hours) at 02/03/14 1057 Last data filed at 02/03/14 1000  Gross per 24 hour  Intake 2290.17 ml  Output   2575 ml  Net -284.83 ml    PHYSICAL EXAMINATION: General:  Frail, cachectic, chronically ill appearing female, mild distress  Neuro:  Opens eyes, tracks, otherwise does not interact, does not follow commands, MAE spontaneously  HEENT:  Mm dry, no JVD Cardiovascular:  s1s2 rrr, tachy Lungs:  resps even non labored, tachypneic, diminished bases  Abdomen:  Soft, +bs Musculoskeletal:  Warm and dry, no edema, foot drops boots   LABS:  CBC  Recent Labs Lab 01/31/14 0250 02/01/14 0501 02/02/14 0018  WBC 9.3 9.2 10.5  HGB 10.0* 9.9* 10.3*  HCT 29.6* 30.4* 31.3*  PLT 182 148* 126*   Coag's  Recent Labs Lab 01/27/14 1512  INR 1.06   BMET  Recent Labs Lab 01/31/14 0250 02/01/14 0501 02/02/14 0018  NA 151* 144 139  K 3.2* 3.5* 3.6*  CL  115* 111 106  CO2 22 20 19   BUN 55* 36* 32*  CREATININE 1.64* 1.49* 1.45*  GLUCOSE 111* 106* 110*   Electrolytes  Recent Labs Lab 01/31/14 0250 02/01/14 0501 02/02/14 0018  CALCIUM 9.2 9.0 8.8   Sepsis Markers  Recent Labs Lab 01/27/14 1555 01/27/14 1842 01/29/14 0731 01/29/14 0845 01/31/14 0250 02/02/14 0018  LATICACIDVEN 3.65* 1.57 1.1  --   --   --   PROCALCITON  --   --   --  0.54 1.26 0.76   ABG  Recent Labs Lab 01/29/14 0633  PHART 7.369  PCO2ART 22.9*  PO2ART 71.8*   Liver Enzymes  Recent Labs Lab 01/28/14 0137 01/29/14 0845 01/30/14 0850  AST 14 16 11    ALT 6 6 5   ALKPHOS 77 69 52  BILITOT 0.3 0.4 0.7  ALBUMIN 2.7* 2.8* 2.1*   Cardiac Enzymes  Recent Labs Lab 01/28/14 1500 01/28/14 1900 01/29/14 0105  TROPONINI 0.53* 0.52* 0.49*   Glucose  Recent Labs Lab 02/02/14 1124 02/02/14 1617 02/02/14 2038 02/02/14 2348 02/03/14 0405 02/03/14 0752  GLUCAP 131* 104* 130* 112* 119* 109*    Imaging No results found. CXR - improved LLL consolidation  ASSESSMENT / PLAN:  PULMONARY Acute hypoxic resp failure Severe tachypnea - somewhat improved after treatment of high fever Mild pulmonary infiltrates - bact vs pneumocystis PNA P:   Cont abx as below per ID  Supplemental O2 as needed to keep sats >92%  rx fever  Transfer to ICU for closer monitoring F/u CXR   NEUROLOGIC Acute enceph HSV encephalitis  ?seizures P:   RASS goal: 0 Neurology following  Cont keppra  Acyclovir as below  CSF CMV, EBV and JCV pending    CARDIOVASCULAR HTN  tachycardia exacerbated by fever SIRS - risk of septic shock Elevated troponins - suspect demand ischemia  Diastolic dysfunction - EF 55-60% P:  rx fever  Gentle volume  PRN lopressor to keep HR <130 PRN hydralazine to keep SBP <170 D/c clonidine for now Asa   RENAL AKI  Metabolic acidosis  P:   Monitor BMET intermittently Monitor I/Os Correct electrolytes as indicated   GASTROINTESTINAL Protein calorie malnutrition  Diarrhea - CDiff neg, Giardia and cryptosporidium negative P:   Hold TF for now with tenuous reps status  When more stable resume TF  Nutrition following   HEMATOLOGIC No active issue  P:  lovenox F/u cbc   INFECTIOUS HIV/AIDS - new dx  ? PNA - bacterial vs pneumocystis  HSV encephalitis  Persistent fever (All abx per ID) P:   BCx2 10/23>>> BCx2 10/27>>> CSF culture 10/23>>> neg  UC 10/21>>>neg  PCP >>> Acyclovir 10/26>>> Zosyn 10/25>>> Bactrim  (empiric PCP rx) 10/27>>> Azithro (weekly, MAC prophylaxis) 10/27>>> Diflucan  10/27>>>  Antimicrobial therapies per ID Trend wbc, fever curve   Antipyretics  ENDOCRINE No active issue  P:   Monitor glucose on chem     FAMILY  - Updates: no family available 10/28  Dirk DressKaty Whiteheart, NP 02/03/2014  10:57 AM Pager: [phone] or [phone]    PCCM ATTENDING: I have interviewed and examined the patient and reviewed the database. I have formulated the assessment and plan as reflected in the note above with amendments made by me.   Transferred due to worsening resp status and cognition. Both have improved somewhat with treatment of high fever  Billy Fischeravid Jeri Jeanbaptiste, MD;  PCCM service; Mobile [phone]

## 2014-02-03 NOTE — Progress Notes (Signed)
NEURO HOSPITALIST PROGRESS NOTE   SUBJECTIVE:                                                                                                                        Eyes open, moans, otherwise non verbal. No further seizures noted. HSV 2 detected  CSF WBC 78, RBC 1479, protein 399, glucose 85. Gram stain negative. Cryptococcal antigen negative. CD4 count low, HIV reactive, HIV viral load 251,358.  CSF VDRL -non reactive, CMV, EBV, JCV- pending  CSF Surgical pathology- Mixed inflammatory cells.    OBJECTIVE:                                                                                                                           Vital signs in last 24 hours: Temp:  [97.5 F (36.4 C)-98.9 F (37.2 C)] 97.9 F (36.6 C) (10/28 0750) Pulse Rate:  [100-127] 100 (10/28 0750) Resp:  [28-40] 40 (10/28 0410) BP: (124-153)/(75-92) 144/86 mmHg (10/28 0750) SpO2:  [94 %-100 %] 99 % (10/28 0630) FiO2 (%):  [28 %] 28 % (10/28 0750) Weight:  [40 kg (88 lb 2.9 oz)] 40 kg (88 lb 2.9 oz) (10/28 0410)  Intake/Output from previous day: 10/27 0701 - 10/28 0700 In: 2255.2 [I.V.:850; NG/GT:542.7; IV Piggyback:862.5] Out: 2425 [Urine:1625; Stool:800] Intake/Output this shift: Total I/O In: 85 [I.V.:50; NG/GT:35] Out: -  Nutritional status: NPO  History reviewed. No pertinent past medical history.   Neurologic Exam:  Patient is awake but is non verbal and does not follow commands.  Pupils were equal and reacted normally to light.  Extraocular movements were full on right left lateral gaze and tracks my motions in the room  Blinks to threat bilaterally.  She appear to have moderate right lower facial weakness.  Slight increase in muscle tone throughout.Resistance to passive movement was symmetrical. Withdrawal movements to noxious stimuli were equal.  Responses to sensory testing unreliable.  Deep tendon reflexes were 1+ and symmetrical.  Plantar responses  were flexor bilaterally    Lab Results: Lab Results  Component Value Date/Time   CHOL 120 02/01/2014  5:01 AM   Lipid Panel  Recent Labs  02/01/14 0501  CHOL 120  TRIG 118  HDL 46  CHOLHDL 2.6  VLDL 24  LDLCALC 50    Studies/Results: Dg Chest Port 1 View  02/03/2014   CLINICAL DATA:  Persistent pneumonia  EXAM: PORTABLE CHEST - 1 VIEW  COMPARISON:  February 01, 2014  FINDINGS: Feeding tube tip is below the diaphragm. No pneumothorax. There is persistent left lower lobe consolidation. Patchy airspace consolidation in the right lower lobe is again noted. In comparison with the recent prior study, there is slightly less perihilar interstitial pneumonitis. No new infiltrate is appreciable. Heart size and pulmonary vascularity are normal. No adenopathy. No bone lesions.  IMPRESSION: Persistent airspace consolidation in the left lower lobe and to a lesser extent in the right base. There is slightly less perihilar interstitial pneumonitis compared to recent prior study. No new opacity. No change in cardiac silhouette. No pneumothorax.   Electronically Signed   By: Bretta Bang M.D.   On: 02/03/2014 07:51   Dg Chest Port 1 View  02/01/2014   CLINICAL DATA:  Fever, aspiration pneumonia.  EXAM: PORTABLE CHEST - 1 VIEW  COMPARISON:  01/29/2014.  FINDINGS: Feeding tube is followed into the stomach with the tip projecting beyond the inferior margin of the image. Trachea is midline. Heart size stable. There is worsening bilateral airspace disease, somewhat perihilar predominant, right greater than left. No definite pleural fluid. No pneumothorax.  IMPRESSION: Worsening bilateral airspace disease, somewhat perihilar predominant, right greater than left, suspicious for pneumonia. Aspiration not excluded.   Electronically Signed   By: Leanna Battles M.D.   On: 02/01/2014 15:26    MEDICATIONS                                                                                                                         Scheduled: . acyclovir  10 mg/kg Intravenous Q12H  . antiseptic oral rinse  7 mL Mouth Rinse q12n4p  . aspirin  81 mg Oral Daily  . azithromycin  1,200 mg Oral Weekly  . chlorhexidine  15 mL Mouth Rinse BID  . cloNIDine  0.2 mg Transdermal Weekly  . enoxaparin (LOVENOX) injection  30 mg Subcutaneous Q24H  . fluconazole  400 mg Oral Daily  . levETIRAcetam  500 mg Intravenous Q12H  . metoprolol  5 mg Intravenous Q4H  . mupirocin ointment  1 application Nasal BID  . piperacillin-tazobactam (ZOSYN)  IV  3.375 g Intravenous Q8H  . sodium chloride  3 mL Intravenous Q12H  . sulfamethoxazole-trimethoprim  15 mg/kg/day Intravenous 3 times per day  . vancomycin  750 mg Intravenous Q24H    ASSESSMENT/PLAN:  43 y/o with new onset encephalopathy and probable seizure. Profound weight loss. 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   02/03/2014, 8:46 AM

## 2014-02-03 NOTE — Progress Notes (Addendum)
[facility] for Infectious Disease    Date of Admission:  01/27/2014   Total days of antibiotics 6        Day 5 vanc- d/c'd 10/28        Day 6 Zosyn        Day 6 Acyclovir        DAy 2 bactrim        - weekly Azithromycin   ID: Desiree Hale is a 43 y.o. female with  presented with AMS, seizure like activity, unintentional weightloss.  Being managed for acute encephalopathy, found to have advanced HIV disease with CD4- 10, and Viral load- 251 358. 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 She was evaluated by PCCM and transferred to [facility] for observation and possibly intubation. She asked "where am I" but still inconsistent with answering questions. Unclear if she remembers that i told her that she has HIV disease for the last 2 days  Medications:  . acyclovir  10 mg/kg Intravenous Q12H  . antiseptic oral rinse  7 mL Mouth Rinse q12n4p  . aspirin  81 mg Oral Daily  . [START ON 02/08/2014] azithromycin  1,200 mg Per Tube Weekly  . chlorhexidine  15 mL Mouth Rinse BID  . enoxaparin (LOVENOX) injection  30 mg Subcutaneous Q24H  . [START ON 02/04/2014] fluconazole  200 mg Per Tube Daily  . levETIRAcetam  500 mg Intravenous Q12H  . piperacillin-tazobactam (ZOSYN)  IV  3.375 g Intravenous Q8H  . sodium chloride  3 mL Intravenous Q12H  . sulfamethoxazole-trimethoprim  15 mg/kg/day Intravenous 3 times per day    Objective: Vital signs in last 24 hours: Temp:  [97.5 F (36.4 C)-102.2 F (39 C)] 98 F (36.7 C) (10/28 1141) Pulse Rate:  [100-123] 123 (10/28 1230) Resp:  [28-41] 41 (10/28 1230) BP: (137-157)/(81-93) 157/93  mmHg (10/28 1230) SpO2:  [91 %-100 %] 91 % (10/28 1230) FiO2 (%):  [28 %] 28 % (10/28 0750) Weight:  [88 lb 2.9 oz (40 kg)] 88 lb 2.9 oz (40 kg) (10/28 0410)  Physical Exam-  General appearance: appears  Older than stated age and lethargic, appears malnourished. Head: Normocephalic, without obvious abnormality, atraumatic, poor dentition, whitish plaque on lateral side of tongue, also small bullae on ant tongue- on the left side, likely from seizure activity. Pulm= diffuse rhonchi GI: soft, no guarding today, no hepato or splenomegaly appreciated, rectal tube draining dark liquid stool- >178ms. Extremities: extremities normal, atraumatic, no cyanosis or edema, boot on feet. Pulses: 2+ and symmetric Skin: warm.  Lab Results  Recent Labs  02/01/14 0501 02/02/14 0018 02/03/14 1210  WBC 9.2 10.5 12.8*  HGB 9.9* 10.3* 9.0*  HCT 30.4* 31.3* 27.0*  NA 144 139  --   K 3.5* 3.6*  --   CL 111 106  --   CO2 20 19  --   BUN 36* 32*  --   CREATININE 1.49* 1.45*  --    Microbiology: 10/21- Urine culture- NG- Final 10/23- Blood culture X2 - NGTD >>  10/23 - stool studies negative for cdiff, giardia, cryptosporidium 10/23- MRSA positive. 10/24-  AFB culture blood- NGTD >> 10/24- CSF gram stain- No org seen, WBC present. 10/24- CSF culture- NGTD >> 10/26- Fungal culture >> 10/27- Blood cultures X2 >>  Studies/Results: Dg Abd Portable 1v  01/31/2014   CLINICAL DATA:  NG tube placement.  EXAM: PORTABLE ABDOMEN - 1 VIEW  COMPARISON:  None.  FINDINGS: Bowel gas pattern is nonobstructive. A feeding tube follows the greater curvature of the stomach and terminates in the expected location of the antrum/pylorus.  IMPRESSION: Feeding tube terminates in the expected location of the antrum/pylorus.   Electronically Signed   By: Curlene Dolphin M.D.   On: 01/31/2014 18:58   Dg Fluoro Guide Lumbar Puncture  01/30/2014   CLINICAL DATA:  43 year old female with fever and altered mental status.  Suspicious for encephalitis.  IMPRESSION: 1. Successful uncomplicated fluoroscopic guided lumbar puncture at L3-L4 with opening pressure of 17 cm of water, yielding consistently light yellow tinged CSF.   Electronically Signed   By: Vinnie Langton M.D.   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 Placed on broad spectrum for aspiration pneumonia, presumed PCP and Acyclovir- for HSV encephalitis.  Still having fevers and increasing work of breathing  HSV encephalitis- MRI- 10/22- with diffuse signal abnormality- temporal and posterior frontal lobes. CSF- Increased protein and lymphocytes. CSF Crypto, Gram stain and cultures so far negative. CSF HSV 2 detected.  - Neurology following. - CSF VDRL, CMV, EBV, JCV- pending - Continue Acyclovir  - overall, encephalopathy is slowing improving  Aspiration PNA vs. pcp- Suggested by new infiltrates - right lung on chest xray- 10/23. Legionella and Strep Ag negative. Repeat chest xray- 10/26- increased diffuse airspace disease, in addition to right lower lobe infiltrate. Concern that she may have pcp since also has increased LDH  - will keep on zosyn for aspiration but add bactrim for pcp treatment - will ask RT to NTS patient so that we can send specimen for pcp dfa - if she gets intubated would review ABG to see if need to add steroids   HIV- AIDS- Viral load- 251 358. CD4- 10,  HIV genotype pending. - awaiting to results from [place] (anticipated to 10/30) so that we can start empiric treatment ,want to avoid tenofovir due to ckd.  - Start MAC prophylaxis with Azithromycin , start treatment with Bactrim for PCP - Oral thrush- Will start Fluconazole. - disclosed her HIV + positive status to the patient- 10/27, who appeared like she understood very briefly but was unable to tell us who else she would like [place] to speak with. Unclear if she has power of attorney. Will keep  discussing this with her. At this point, i don't believe she has full understanding of diagnosis.  AIDs with Diarrhea- Giardia and cryptosporidium negative, C. Diff, GI pathogen panel negative. Differentials- Disseminated MAC, CMV colitis, HIV related enteropathy - continue to support for volume loss   Fevers : fungal and afb blood cultures collected. Will check CMV viral load. Lipid profile- TG- 120, ferritin- 1050, so far has not met criteria for [facility] (hemophagocytic lymphohistiocytosis). - repeat blood culture and fungal cultures, AFB pending. - if cmv viral load is negative, may need to consider disseminated mac as possible cause of on going fever. Fever curve trending down this morning  Severe protein-caloric malnutrition = continue with nutritional support through ng  dispo = attempt to find poa for her and also address if she would  like for [place] to tell her family/children of new diagnosis of hiv.   Elzie Rings [facility] [phone]

## 2014-02-03 NOTE — Progress Notes (Signed)
Extensive chart review undertaken this morning prior to examining the patient. Attempted to utilize care everywhere to evaluate results from [facility] specifically the infectious disease services but was unable to connect to the system due to an underlying interface problem. Previously I had reviewed the infectious disease notes from this admission; son had informed the physician that at some point his mother told him that she had a cancer diagnosis. My concern was that the patient was utilizing a cancer diagnosis to cover an HIV diagnosis.  It appears the pt has NOT shared her HIV diagnosis w/ her family.  After arrival to the unit and reviewing vital signs from overnight it was noted the patient had persistent tachycardia and tachypnea and now with increased O2 needs. On exam noted with bilateral diffuse crackles. Patient mentation labile. She does awaken but is inconsistent when answering questions. I questioned her regarding the cancer diagnosis and when it was given. I was unable to obtain a consistent answer from her. I later asked her if she knew she was HIV-positive before this admission and that she was telling her family she had cancer instead. She nodded yes but this too was inconsistent so I am unsure of the validity of this patient's response given her underlying encephalopathy.  I discussed the case with my attending physician Dr. Sharon SellerMcClung and we agreed that pulmonary critical care medicine should evaluate the patient for increased work of breathing. In addition to the above findings the patient was febrile at 102.82F. She is currently being treated for presumed PCP. The etiology to her encephalopathy appears to be herpetic in nature given a positive HSV-2  CSF culture. I spoke with Dr. Sung AmabileSimonds who came by and evaluated the patient and agreed she needed to be transferred to the ICU although in no urgent need of intubation at time of his examination. PCCM has assumed care of this patient.  Sheralyn BoatmanAlison  Hale, ANP [facility] Pager: [phone]  I have reviewed the above note/information. I have made any necessary editorial changes, and agree with its content.  Desiree BloodJeffrey T. Leandrew Keech, MD [facility]

## 2014-02-03 NOTE — Progress Notes (Signed)
Asked to see this 43 y.o with HIV, CD4 10, with HSV encephalitis & aspiration pneumonia for tachycardia & tachypnea On my arrival, on NRB, breathing 30s, tachy 120's, arousable but drifts back to sleep. Visible WOB, accessory muscles + Chart & course reviewed Discussed with daughter, decided to intubate Vent settings , sedation ordered with precedex / fent gtt ABG Theodosia Quay/CXR will be reviewed Keep npo for bronchoscopy in am  Addn cc time x 4934m  Oretha MilchALVA,Marolyn Urschel V. MD

## 2014-02-03 NOTE — Progress Notes (Signed)
Physical Therapy Treatment Patient Details Name: Desiree BeamsKristy Hale MRN: [id number] DOB: 12-26-1970 Today's Date: 02/03/2014    History of Present Illness 43 year old female with past medical history of hypertension, pancreatic cancer (per ED resident note, but this is not noted in H&P), marijuana use who [facility] after she was found unresponsive at home by a family member. Patient was found shaking, lying on the floor, unable to speak.    PT Comments    Pt with increased work of breathing today while simply still in bed.  Pt HOB elevated with pt's RR increasing to upper 30's and pt grimacing.  O2 sats remained in upper 90's with HR 115-120's while on venti mask.  Deferred further mobility at this time.  Will continue to follow.    Follow Up Recommendations  SNF     Equipment Recommendations   (TBD)    Recommendations for Other Services OT consult     Precautions / Restrictions Precautions Precautions: Fall Precaution Comments: non-communicative Restrictions Weight Bearing Restrictions: No    Mobility  Bed Mobility               General bed mobility comments: [facility] and elevated HOB.  pt with increased work of breathing with RR high 30's and HR 115-120's while still in bed.  O2 sats upper 90's while on venti mask.    Transfers                    Ambulation/Gait                 Stairs            Wheelchair Mobility    Modified Rankin (Stroke Patients Only)       Balance                                    Cognition Arousal/Alertness: Lethargic Behavior During Therapy: Flat affect Overall Cognitive Status: Difficult to assess                      Exercises      General Comments        Pertinent Vitals/Pain Pain Assessment: Faces Faces Pain Scale: Hurts even more Pain Location: pt grimacing whili still in bed.   Pain Descriptors / Indicators: Grimacing Pain  Intervention(s): Monitored during session;Repositioned    Home Living                      Prior Function            PT Goals (current goals can now be found in the care plan section) Acute Rehab PT Goals PT Goal Formulation: Patient unable to participate in goal setting Time For Goal Achievement: 02/11/14 Potential to Achieve Goals: Fair Progress towards PT goals: Not progressing toward goals - comment (Increased work of breathing.)    Frequency  Min 2X/week    PT Plan Current plan remains appropriate    Co-evaluation             End of Session Equipment Utilized During Treatment: Oxygen Activity Tolerance: Treatment limited secondary to medical complications (Comment) Patient left: in bed;with call bell/phone within reach     Time: 0756-0807 PT Time Calculation (min): 11 min  Charges:  $Therapeutic Activity: 8-22 mins  G CodesSunny Schlein:      Dalal Livengood F, [place] CarolinaPT [phone] 02/03/2014, 8:42 AM

## 2014-02-03 NOTE — Progress Notes (Signed)
NUTRITION FOLLOW UP  DOCUMENTATION CODES Per approved criteria  -Severe malnutrition in the context of chronic illness -Underweight   INTERVENTION: Once medically appropriate, re-start Jevity 1.2 formula at 15 ml/hr and increase by 10 ml every 12 hours to goal rate of 55 ml/hr to provide 1584 kcals, 73 gm protein, 1065 ml of free water Monitor Mg, Phos, K+ levels RD to follow for nutrition care plan  NUTRITION DIAGNOSIS: Inadequate oral intake related to inability to eat, acute encephalopathy as evidenced by NPO status, ongoing  Goal: Pt to meet >/= 90% of their estimated nutrition needs, currently unmet  Monitor:  TF regimen & tolerance, respiratory status, weight, labs, I/O's  ASSESSMENT: 43 year old Female with PMH of HTN, marijuana use who [facility] after she was found unresponsive at home by a family member. CT head showed no acute intracranial findings. Chest x-ray showed no active disease. Patient has received fluid boluses in ED. Patient was admitted for further evaluation and management of altered mental status, dehydration.  Patient with + HIV test.  Patient s/p procedure 10/24: HC DG FLUORO GUIDE LUMBAR PUNCTURE   Patient transferred to MICU from 2C-Stepdown given worsening fevers, tachypnea and respiratory failure.  Jevity 1.2 formula via NGT currently off.  Prior to transfer, previously infusing at 45 ml/hr (goal rate is 55 ml/hr).  Per CCM note, to resume once more medically stable.  Patient continues to be at risk for refeeding given malnutrition.   Height: Ht Readings from Last 1 Encounters:  01/27/14 5\' 7"  (1.702 m)    Weight: Wt Readings from Last 1 Encounters:  02/03/14 88 lb 2.9 oz (40 kg)    10/27  88 lb 10/26  92 lb 10/25  86 lb 10/24  86 lb 10/23  92 lb 10/22  94 lb 10/21  88 lb  BMI:  Body mass index is 13.81 kg/(m^2).  Estimated Nutritional Needs: Kcal: 1450-1650 Protein: 70-80 gm Fluid: >/= 1.5 L  Skin:  Intact  Diet Order: NPO   Intake/Output Summary (Last 24 hours) at 02/03/14 1330 Last data filed at 02/03/14 1202  Gross per 24 hour  Intake 2791.84 ml  Output   2600 ml  Net 191.84 ml    Labs:   Recent Labs Lab 01/31/14 0250 02/01/14 0501 02/02/14 0018  NA 151* 144 139  K 3.2* 3.5* 3.6*  CL 115* 111 106  CO2 22 20 19   BUN 55* 36* 32*  CREATININE 1.64* 1.49* 1.45*  CALCIUM 9.2 9.0 8.8  GLUCOSE 111* 106* 110*    CBG (last 3)   Recent Labs  02/03/14 0405 02/03/14 0752 02/03/14 1139  GLUCAP 119* 109* 142*    Scheduled Meds: . acyclovir  10 mg/kg Intravenous Q12H  . antiseptic oral rinse  7 mL Mouth Rinse q12n4p  . aspirin  81 mg Oral Daily  . [START ON 02/08/2014] azithromycin  1,200 mg Per Tube Weekly  . chlorhexidine  15 mL Mouth Rinse BID  . enoxaparin (LOVENOX) injection  30 mg Subcutaneous Q24H  . [START ON 02/04/2014] fluconazole  200 mg Per Tube Daily  . levETIRAcetam  500 mg Intravenous Q12H  . piperacillin-tazobactam (ZOSYN)  IV  3.375 g Intravenous Q8H  . sodium chloride  3 mL Intravenous Q12H  . sulfamethoxazole-trimethoprim  15 mg/kg/day Intravenous 3 times per day    Continuous Infusions: . dextrose 5 % with KCl 20 mEq / L 20 mEq (02/03/14 1100)  . feeding supplement (JEVITY 1.2 CAL) Stopped (02/03/14 1045)  Past Medical History  Diagnosis Date  . Hypertension   . HIV (human immunodeficiency virus infection)     History reviewed. No pertinent past surgical history.  Maureen ChattersKatie Ahmani Daoud, RD, LDN Pager #: [phone] After-Hours Pager #: [phone]

## 2014-02-03 NOTE — Progress Notes (Signed)
Dr. Sharon SellerMCclung was paged earlier due to pt being anxious  tachypneic and tachycardic,Allison came and ordered STAT ABG , Dr. Sung AmabileSimonds came and said pt needs to be transferred to ICU , with temp of 102.2, PRN tylenol given via panda tube prior to transfer on venti mask sats 99-100%, the son was informed about the transfer and he said he is on his way to see her.

## 2014-02-04 ENCOUNTER — Inpatient Hospital Stay (HOSPITAL_COMMUNITY): Payer: Medicaid Other

## 2014-02-04 LAB — GLUCOSE, CAPILLARY
GLUCOSE-CAPILLARY: 91 mg/dL (ref 70–99)
Glucose-Capillary: 116 mg/dL — ABNORMAL HIGH (ref 70–99)
Glucose-Capillary: 140 mg/dL — ABNORMAL HIGH (ref 70–99)

## 2014-02-04 LAB — BLOOD GAS, ARTERIAL
ACID-BASE DEFICIT: 9.1 mmol/L — AB (ref 0.0–2.0)
Bicarbonate: 15.7 mEq/L — ABNORMAL LOW (ref 20.0–24.0)
DRAWN BY: 252031
FIO2: 0.6 %
LHR: 24 {breaths}/min
MECHVT: 500 mL
O2 SAT: 87.2 %
PCO2 ART: 31.3 mmHg — AB (ref 35.0–45.0)
PEEP: 5 cmH2O
Patient temperature: 98.6
TCO2: 16.7 mmol/L (ref 0–100)
pH, Arterial: 7.323 — ABNORMAL LOW (ref 7.350–7.450)
pO2, Arterial: 57.9 mmHg — ABNORMAL LOW (ref 80.0–100.0)

## 2014-02-04 LAB — BASIC METABOLIC PANEL
ANION GAP: 17 — AB (ref 5–15)
Anion gap: 18 — ABNORMAL HIGH (ref 5–15)
BUN: 36 mg/dL — ABNORMAL HIGH (ref 6–23)
BUN: 40 mg/dL — AB (ref 6–23)
CHLORIDE: 102 meq/L (ref 96–112)
CO2: 16 meq/L — AB (ref 19–32)
CO2: 15 mEq/L — ABNORMAL LOW (ref 19–32)
CREATININE: 2.65 mg/dL — AB (ref 0.50–1.10)
Calcium: 8.6 mg/dL (ref 8.4–10.5)
Calcium: 8.3 mg/dL — ABNORMAL LOW (ref 8.4–10.5)
Chloride: 103 mEq/L (ref 96–112)
Creatinine, Ser: 2.27 mg/dL — ABNORMAL HIGH (ref 0.50–1.10)
GFR calc Af Amer: 29 mL/min — ABNORMAL LOW (ref >90)
GFR calc Af Amer: 24 mL/min — ABNORMAL LOW (ref >90)
GFR calc non Af Amer: 25 mL/min — ABNORMAL LOW (ref >90)
GFR, EST NON AFRICAN AMERICAN: 21 mL/min — AB (ref >90)
GLUCOSE: 86 mg/dL (ref 70–99)
Glucose, Bld: 123 mg/dL — ABNORMAL HIGH (ref 70–99)
POTASSIUM: 4.1 meq/L (ref 3.7–5.3)
Potassium: 3.9 mEq/L (ref 3.7–5.3)
SODIUM: 135 meq/L — AB (ref 137–147)
Sodium: 136 mEq/L — ABNORMAL LOW (ref 137–147)

## 2014-02-04 LAB — CULTURE, BLOOD (ROUTINE X 2)
CULTURE: NO GROWTH
Culture: NO GROWTH

## 2014-02-04 LAB — CBC
HEMATOCRIT: 27.7 % — AB (ref 36.0–46.0)
Hemoglobin: 9.3 g/dL — ABNORMAL LOW (ref 12.0–15.0)
MCH: 26.7 pg (ref 26.0–34.0)
MCHC: 33.6 g/dL (ref 30.0–36.0)
MCV: 79.6 fL (ref 78.0–100.0)
Platelets: 158 10*3/uL (ref 150–400)
RBC: 3.48 MIL/uL — AB (ref 3.87–5.11)
RDW: 17.2 % — AB (ref 11.5–15.5)
WBC: 9.7 10*3/uL (ref 4.0–10.5)

## 2014-02-04 LAB — PNEUMOCYSTIS JIROVECI SMEAR BY DFA: Pneumocystis jiroveci Ag: POSITIVE

## 2014-02-04 MED ORDER — PIPERACILLIN-TAZOBACTAM IN DEX 2-0.25 GM/50ML IV SOLN
2.2500 g | Freq: Three times a day (TID) | INTRAVENOUS | Status: AC
Start: 2014-02-04 — End: 2014-02-04
  Administered 2014-02-04: 2.25 g via INTRAVENOUS
  Filled 2014-02-04: qty 50

## 2014-02-04 MED ORDER — ACYCLOVIR SODIUM 50 MG/ML IV SOLN
10.0000 mg/kg | INTRAVENOUS | Status: DC
  Administered 2014-02-05: 400 mg via INTRAVENOUS
  Filled 2014-02-04 (×2): qty 8

## 2014-02-04 MED ORDER — FLUCONAZOLE 40 MG/ML PO SUSR
100.0000 mg | Freq: Every day | ORAL | Status: DC
  Administered 2014-02-05 – 2014-02-13 (×9): 100 mg
  Filled 2014-02-04 (×9): qty 2.5

## 2014-02-04 MED ORDER — SODIUM CHLORIDE 0.9 % IV SOLN
1.2500 mg/kg | INTRAVENOUS | Status: DC
  Administered 2014-02-04: 55 mg via INTRAVENOUS
  Filled 2014-02-04: qty 55

## 2014-02-04 MED ORDER — ACYCLOVIR SODIUM 50 MG/ML IV SOLN: 10.0000 mg/kg | INTRAVENOUS | Status: DC

## 2014-02-04 MED ORDER — HEPARIN SODIUM (PORCINE) 5000 UNIT/ML IJ SOLN
5000.0000 [IU] | Freq: Three times a day (TID) | INTRAMUSCULAR | Status: DC
  Administered 2014-02-04 – 2014-02-23 (×54): 5000 [IU] via SUBCUTANEOUS
  Filled 2014-02-04 (×59): qty 1

## 2014-02-04 MED ORDER — JEVITY 1.2 CAL PO LIQD
1000.0000 mL | ORAL | Status: DC
  Administered 2014-02-04: 35 mL/h
  Administered 2014-02-05 – 2014-02-07 (×2)
  Administered 2014-02-08: 1000 mL
  Administered 2014-02-09: 60 mL/h
  Administered 2014-02-10: 1000 mL
  Filled 2014-02-04 (×11): qty 1000

## 2014-02-04 NOTE — Clinical Social Work Note (Signed)
Clinical Social Worker spoke with pt's husband, Desiree Hale via telephone [phone]). Pt's husband reported he is the contact person for the pt. Pt's husband stated he would be visiting pt today and stated that pt lives with daughter, Desiree Hale during the week because he works 3rd shift. Pt's husband was advised to visit the unit to set up a password to receive information via telephone per RN, Herbert SetaHeather.   Clinical Social Worker will sign off for now as social work intervention is no longer needed. Please consult us again if new need arises.  Desiree Hale, MSW, LCSWA [phone] 02/04/2014 3:58 PM

## 2014-02-04 NOTE — Progress Notes (Signed)
PULMONARY / CRITICAL CARE MEDICINE   Name: Desiree Hale MRN: [id number] DOB: 1970/10/22    ADMISSION DATE:  01/27/2014 CONSULTATION DATE:  02/03/14  REFERRING MD :  [facility]   CHIEF COMPLAINT:  AMS, dyspnea   INITIAL PRESENTATION: 43yo female with hx HTN, admitted 10/21 with AMS/?seizures, diagnosed as HSV encephalitis as well as PNA (?aspiration) and new dx HIV.  Followed by neurology and ID and rx with acyclovir, PCP rx.  On 10/28 developed worsening fevers, tachypnea/resp failure and PCCM consulted for tx ICU.  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  Otherwise neg acute.  EEG 10/22>>>normal drowsy and asleep electroencephalogram. No epileptiform activity is noted.    SIGNIFICANT EVENTS:   10/28 - worsening tachypnea, AMS, tx ICU  ETT 10/28 >>  SUBJECTIVE:  Sedated on precedex gtt Febrile Tachy improved Poor UO Blood y secretions  VITAL SIGNS: Temp:  [97.9 F (36.6 C)-102.4 F (39.1 C)] 102.4 F (39.1 C) (10/29 0806) Pulse Rate:  [92-142] 92 (10/29 0900) Resp:  [13-41] 26 (10/29 0900) BP: (96-181)/(63-115) 120/73 mmHg (10/29 0900) SpO2:  [90 %-100 %] 99 % (10/29 0900) FiO2 (%):  [28 %-60 %] 60 % (10/29 0800) Weight:  [45 kg (99 lb 3.3 oz)] 45 kg (99 lb 3.3 oz) (10/29 0500) HEMODYNAMICS:   VENTILATOR SETTINGS: Vent Mode:  [-] PRVC FiO2 (%):  [28 %-60 %] 60 % Set Rate:  [14 bmp-24 bmp] 24 bmp Vt Set:  [500 mL] 500 mL PEEP:  [5 cmH20-10 cmH20] 10 cmH20 Plateau Pressure:  [18 cmH20-26 cmH20] 26 cmH20 INTAKE / OUTPUT:  Intake/Output Summary (Last 24 hours) at 02/04/14 1033 Last data filed at 02/04/14 1000  Gross per 24 hour  Intake 3062.25 ml  Output   1545 ml  Net 1517.25 ml    PHYSICAL EXAMINATION: General:  Frail, cachectic, chronically ill appearing female, mild distress  Neuro:  Opens eyes, tracks, otherwise  does not interact, does not follow commands, MAE spontaneously  HEENT:  Mm dry, no JVD Cardiovascular:  s1s2 rrr, tachy Lungs:  resps even non labored, tachypneic, diminished bases  Abdomen:  Soft, +bs Musculoskeletal:  Warm and dry, no edema, foot drops boots   LABS:  CBC  Recent Labs Lab 02/02/14 0018 02/03/14 1210 02/04/14 0245  WBC 10.5 12.8* 9.7  HGB 10.3* 9.0* 9.3*  HCT 31.3* 27.0* 27.7*  PLT 126* 157 158   Coag's No results found for this basename: APTT, INR,  in the last 168 hours BMET  Recent Labs Lab 02/02/14 0018 02/03/14 1210 02/04/14 0245  NA 139 134* 135*  K 3.6* 3.6* 4.1  CL 106 102 102  CO2 19 16* 16*  BUN 32* 37* 36*  CREATININE 1.45* 2.14* 2.27*  GLUCOSE 110* 119* 86   Electrolytes  Recent Labs Lab 02/02/14 0018 02/03/14 1210 02/04/14 0245  CALCIUM 8.8 8.6 8.6   Sepsis Markers  Recent Labs Lab 01/29/14 0731 01/29/14 0845 01/31/14 0250 02/02/14 0018  LATICACIDVEN 1.1  --   --   --   PROCALCITON  --  0.54 1.26 0.76   ABG  Recent Labs Lab 01/29/14 0633 02/04/14 02/04/14 0207  PHART 7.369 7.159* 7.323*  PCO2ART 22.9* 51.1* 31.3*  PO2ART 71.8* 140.0* 57.9*   Liver Enzymes  Recent Labs Lab 01/29/14 0845 01/30/14 0850  AST 16 11  ALT 6 5  ALKPHOS 69 52  BILITOT 0.4 0.7  ALBUMIN 2.8* 2.1*   Cardiac Enzymes  Recent Labs Lab 01/28/14 1500 01/28/14 1900 01/29/14 0105  TROPONINI 0.53* 0.52* 0.49*   Glucose  Recent Labs Lab 02/03/14 0405 02/03/14 0752 02/03/14 1139 02/03/14 1757 02/04/14 0023 02/04/14 0805  GLUCAP 119* 109* 142* 102* 116* 91    Imaging Dg Chest Port 1 View  02/03/2014   CLINICAL DATA:  Persistent pneumonia  EXAM: PORTABLE CHEST - 1 VIEW  COMPARISON:  February 01, 2014  FINDINGS: Feeding tube tip is below the diaphragm. No pneumothorax. There is persistent left lower lobe consolidation. Patchy airspace consolidation in the right lower lobe is again noted. In comparison with the recent prior  study, there is slightly less perihilar interstitial pneumonitis. No new infiltrate is appreciable. Heart size and pulmonary vascularity are normal. No adenopathy. No bone lesions.  IMPRESSION: Persistent airspace consolidation in the left lower lobe and to a lesser extent in the right base. There is slightly less perihilar interstitial pneumonitis compared to recent prior study. No new opacity. No change in cardiac silhouette. No pneumothorax.   Electronically Signed   By: Bretta BangWilliam  Woodruff M.D.   On: 02/03/2014 07:51   CXR - improved LLL consolidation  ASSESSMENT / PLAN:  PULMONARY Acute hypoxic resp failure Severe tachypnea - somewhat improved after treatment of high fever Mild pulmonary infiltrates - favor aspiration over pneumocystis PNA P:   SBTs as able Bronchoscopy -send BAL for PCP/resp viral panel   NEUROLOGIC Acute enceph HSV encephalitis  ?seizures P:   RASS goal: 0, precedex gtt, int fent  Neurology following  Cont keppra  Acyclovir as below  CSF CMV, EBV and JCV pending    CARDIOVASCULAR HTN  tachycardia exacerbated by fever SIRS - risk of septic shock Elevated troponins - suspect demand ischemia  Diastolic dysfunction - EF 55-60% P:  rx fever  PRN lopressor to keep HR <130 PRN hydralazine to keep SBP <170 D/c clonidine  Asa   RENAL AKI  Metabolic acidosis  P:   Monitor BMET q 12h Monitor I/Os Correct electrolytes as indicated Gentle volume esp while on acyclovir   GASTROINTESTINAL Protein calorie malnutrition  Diarrhea - CDiff neg, Giardia and cryptosporidium negative P:  Nutrition following  -TFs  HEMATOLOGIC No active issue  P:  Sq heparin   INFECTIOUS HIV/AIDS - new dx  ? PNA - bacterial vs pneumocystis  HSV encephalitis  Persistent fever (All abx per ID) P:   BCx2 10/23>>>ng BCx2 10/27>>> CSF culture 10/23>>> neg  UC 10/21>>>neg  BAL PCP 10/29>>> BAL resp viral panel 10/29 >> Acyclovir 10/26>>> Zosyn 10/25>>> Bactrim   (empiric PCP rx) 10/27>>> Azithro (weekly, MAC prophylaxis) 10/27>>> Diflucan 10/27>>>  Antimicrobial therapies per ID Trend wbc, fever curve   Antipyretics  ENDOCRINE No active issue  P:   High risk hypo    FAMILY  - Updates:daughter 10/28 prior to extubation  Need to clarify HCPOA here -spouse for now  The patient is critically ill with multiple organ systems failure and requires high complexity decision making for assessment and support, frequent evaluation and titration of therapies, application of advanced monitoring technologies and extensive interpretation of multiple databases. Critical Care Time devoted to patient care services described in this note is 35 minutes.   Oretha MilchALVA,Dontell Mian V. MD   02/04/2014  10:33 AM

## 2014-02-04 NOTE — Progress Notes (Signed)
NEURO HOSPITALIST PROGRESS NOTE   SUBJECTIVE:                                                                                                                        Transferred to ICU and intubated. No further seizures noted. HSV 2 detected  CSF WBC 78, RBC 1479, protein 399, glucose 85. Gram stain negative. Cryptococcal antigen negative. CD4 count low, HIV reactive, HIV viral load 251,358.  CSF VDRL -non reactive, CMV, JCV- not detected CSF Surgical pathology- Mixed inflammatory cells.    OBJECTIVE:                                                                                                                           Vital signs in last 24 hours: Temp:  [97.9 F (36.6 C)-102.2 F (39 C)] 99.9 F (37.7 C) (10/29 0408) Pulse Rate:  [94-142] 94 (10/29 0700) Resp:  [13-41] 27 (10/29 0700) BP: (113-181)/(74-115) 124/74 mmHg (10/29 0700) SpO2:  [90 %-100 %] 100 % (10/29 0700) FiO2 (%):  [28 %-60 %] 60 % (10/29 0346) Weight:  [45 kg (99 lb 3.3 oz)] 45 kg (99 lb 3.3 oz) (10/29 0500)  Intake/Output from previous day: 10/28 0701 - 10/29 0700 In: 2870.3 [I.V.:1699.8; NG/GT:130; IV Piggyback:863] Out: 1835 [Urine:1435; Stool:400] Intake/Output this shift:   Nutritional status: NPO  Past Medical History  Diagnosis Date  . Hypertension   . HIV (human immunodeficiency virus infection)      Neurologic Exam:  Patient is intubated, on sedation. Not opening eyes to verbal or noxious stimuli Pupils were equal and reacted normally to light.  Eyes midline She appear to have moderate right lower facial weakness.  Slight increase in muscle tone throughout.Resistance to passive movement was symmetrical. No withdrawal to noxious stimuli.  Deep tendon reflexes were 1+ and symmetrical.  Plantar responses were flexor bilaterally    Lab Results: Lab Results  Component Value Date/Time   CHOL 120 02/01/2014  5:01 AM   Lipid Panel No results found for  this basename: CHOL, TRIG, HDL, CHOLHDL, VLDL, LDLCALC,  in the last 72 hours  Studies/Results: Portable Chest Xray  02/04/2014   CLINICAL DATA:  Line placement.  Respiratory failure.  EXAM: PORTABLE CHEST - 1 VIEW  COMPARISON:  02/03/2014 at 7:34 a.m.  FINDINGS: New left IJ catheter, tip at the upper cavoatrial junction. No evidence of pneumothorax.  New endotracheal tube, tip 1 cm above the carina. Feeding tube remains at least in the stomach.  Greater lung volumes after intubation. Bilateral airspace disease persists, focal in the right infrahilar lung can suspicious for pneumonia. No evidence of effusion or pneumothorax.  IMPRESSION: 1. New endotracheal tube, tip 1 cm above the carina. 2. New left IJ catheter is in good position.  No pneumothorax. 3. Improved lung volumes with persistent bilateral airspace disease (likely pneumonia).   Electronically Signed   By: Tiburcio Pea M.D.   On: 02/04/2014 01:07   Dg Chest Port 1 View  02/03/2014   CLINICAL DATA:  Persistent pneumonia  EXAM: PORTABLE CHEST - 1 VIEW  COMPARISON:  February 01, 2014  FINDINGS: Feeding tube tip is below the diaphragm. No pneumothorax. There is persistent left lower lobe consolidation. Patchy airspace consolidation in the right lower lobe is again noted. In comparison with the recent prior study, there is slightly less perihilar interstitial pneumonitis. No new infiltrate is appreciable. Heart size and pulmonary vascularity are normal. No adenopathy. No bone lesions.  IMPRESSION: Persistent airspace consolidation in the left lower lobe and to a lesser extent in the right base. There is slightly less perihilar interstitial pneumonitis compared to recent prior study. No new opacity. No change in cardiac silhouette. No pneumothorax.   Electronically Signed   By: Bretta Bang M.D.   On: 02/03/2014 07:51    MEDICATIONS                                                                                                                         Scheduled: . acyclovir  10 mg/kg Intravenous Q24H  . antiseptic oral rinse  7 mL Mouth Rinse q12n4p  . aspirin  81 mg Oral Daily  . [START ON 02/08/2014] azithromycin  1,200 mg Per Tube Weekly  . chlorhexidine  15 mL Mouth Rinse BID  . enoxaparin (LOVENOX) injection  30 mg Subcutaneous Q24H  . fluconazole  200 mg Per Tube Daily  . levETIRAcetam  500 mg Intravenous Q12H  . pantoprazole sodium  40 mg Per Tube Q1200  . piperacillin-tazobactam (ZOSYN)  IV  2.25 g Intravenous Q8H  . sodium chloride  3 mL Intravenous Q12H  . sulfamethoxazole-trimethoprim  5 mg/kg of trimethoprim Intravenous Q12H    ASSESSMENT/PLAN:  43 y/o with new onset encephalopathy and probable seizure. Profound weight loss. 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 Worsening mental status likely related to infectious PNA. No signs of seizure activity.   -continue acyclovir per ID recs -pending CSF CMV, EBV and JCV -continue keppra 500mg  BID -will continue to follow   Desiree Choeter Addis Bennie, DO [facility] [phone]  If 7pm- 7am, please page neurology on call as listed in AMION.   02/04/2014, 7:14 AM

## 2014-02-04 NOTE — Progress Notes (Signed)
NUTRITION FOLLOW UP  DOCUMENTATION CODES Per approved criteria  -Severe malnutrition in the context of chronic illness -Underweight   INTERVENTION:  Monitor magnesium, potassium, and phosphorus daily for 2 more days, MD to replete as needed, as pt is at risk for refeeding syndrome given severe PCM. Initiate TF via NGT with Jevity 1.2 at 35 ml/h (previous rate before TF stopped ), increase by 10 ml every 12 hours to goal rate of 60 ml/h (1440 ml per day) to provide 1728 kcals, 80 gm protein, 1166 ml free water daily.  NUTRITION DIAGNOSIS: Inadequate oral intake related to inability to eat, acute encephalopathy as evidenced by NPO status, ongoing  Goal: Intake to meet >90% of estimated nutrition needs, currently unmet  Monitor:  TF regimen & tolerance, respiratory status, weight, labs  ASSESSMENT: 43 year old Female with PMH of HTN, marijuana use who [facility] after she was found unresponsive at home by a family member. CT head showed no acute intracranial findings. Chest x-ray showed no active disease. Patient has received fluid boluses in ED. Patient was admitted for further evaluation and management of altered mental status, dehydration.  Patient with + HIV test.  Patient s/p procedure 10/24: HC DG FLUORO GUIDE LUMBAR PUNCTURE   Patient required intubation after transfer to ICU on 10/28. Discussed patient in ICU rounds today. Okay to resume TF today. Patient continues to be at risk for refeeding syndrome given malnutrition.   Patient is currently intubated on ventilator support MV: 15.9 L/min Temp (24hrs), Avg:99.8 F (37.7 C), Min:97.9 F (36.6 C), Max:102.4 F (39.1 C)   Height: Ht Readings from Last 1 Encounters:  01/27/14 5\' 7"  (1.702 m)    Weight: Wt Readings from Last 1 Encounters:  02/04/14 99 lb 3.3 oz (45 kg)   10/28  88 lb 10/27  88 lb 10/26  92 lb 10/25  86 lb 10/24  86 lb 10/23  92 lb 10/22  94 lb 10/21  88 lb  BMI:  Body mass  index is 15.53 kg/(m^2).  Estimated Nutritional Needs: Kcal: 1763 Protein: 70-90 gm Fluid: >/= 1.5 L  Skin: Intact  Diet Order: NPO   Intake/Output Summary (Last 24 hours) at 02/04/14 1157 Last data filed at 02/04/14 1100  Gross per 24 hour  Intake 3016.08 ml  Output   1645 ml  Net 1371.08 ml    Labs:   Recent Labs Lab 02/02/14 0018 02/03/14 1210 02/04/14 0245  NA 139 134* 135*  K 3.6* 3.6* 4.1  CL 106 102 102  CO2 19 16* 16*  BUN 32* 37* 36*  CREATININE 1.45* 2.14* 2.27*  CALCIUM 8.8 8.6 8.6  GLUCOSE 110* 119* 86    CBG (last 3)   Recent Labs  02/03/14 1757 02/04/14 0023 02/04/14 0805  GLUCAP 102* 116* 91    Scheduled Meds: . antiseptic oral rinse  7 mL Mouth Rinse q12n4p  . aspirin  81 mg Oral Daily  . [START ON 02/08/2014] azithromycin  1,200 mg Per Tube Weekly  . chlorhexidine  15 mL Mouth Rinse BID  . fluconazole  200 mg Per Tube Daily  . ganciclovir (CYTOVENE) IV  1.25 mg/kg Intravenous Q24H  . heparin subcutaneous  5,000 Units Subcutaneous 3 times per day  . levETIRAcetam  500 mg Intravenous Q12H  . pantoprazole sodium  40 mg Per Tube Q1200  . piperacillin-tazobactam (ZOSYN)  IV  2.25 g Intravenous Q8H  . sodium chloride  3 mL Intravenous Q12H  . sulfamethoxazole-trimethoprim  5 mg/kg  of trimethoprim Intravenous Q12H    Continuous Infusions: . dexmedetomidine 0.8 mcg/kg/hr (02/04/14 0941)  . dextrose 5 % and 0.9% NaCl 75 mL/hr at 02/04/14 1144  . feeding supplement (JEVITY 1.2 CAL) Stopped (02/03/14 1045)    Past Medical History  Diagnosis Date  . Hypertension   . HIV (human immunodeficiency virus infection)     History reviewed. No pertinent past surgical history.   Joaquin CourtsKimberly Harris, RD, LDN, CNSC Pager [phone] After Hours Pager [phone]

## 2014-02-04 NOTE — Progress Notes (Addendum)
ANTIBIOTIC CONSULT NOTE - FOLLOW UP  Pharmacy Consult for Bactrim, Ganciclovir, Zosyn, Fluconazole Indication: asp pna, CMV viremia, HSV encephalitis, r/o PCP, thrush  Allergies  Allergen Reactions  . Oxycodone Other (See Comments)   Patient Measurements: Height: 5\' 7"  (170.2 cm) Weight: 99 lb 3.3 oz (45 kg) IBW/kg (Calculated) : 61.6  Vital Signs: Temp: 102.4 F (39.1 C) (10/29 0806) Temp Source: Oral (10/29 0806) BP: 120/73 mmHg (10/29 0900) Pulse Rate: 92 (10/29 0900) Intake/Output from previous day: 10/28 0701 - 10/29 0700 In: 2963.3 [I.V.:1782.8; NG/GT:130; IV Piggyback:863] Out: 1835 [Urine:1435; Stool:400] Intake/Output from this shift: Total I/O In: 369 [I.V.:249; Other:20; IV Piggyback:100] Out: 10 [Urine:10]  Labs:  Recent Labs  02/02/14 0018 02/03/14 1210 02/04/14 0245  WBC 10.5 12.8* 9.7  HGB 10.3* 9.0* 9.3*  PLT 126* 157 158  CREATININE 1.45* 2.14* 2.27*   Estimated Creatinine Clearance: 22.5 ml/min (by C-G formula based on Cr of 2.27).   Assessment: 43yof with newly diagnosed HIV. Pt on Zosyn (Day #7) for aspiration PNA, Ganciclovir (Day #1) for CMV viremia and HSV encephalitis, Bactrim (Day #4) for PCP tx, and Azithromycin for MAC px. Still spiking fevers - Tm 102.4. WBC trending down. ID on board.HIV + (new dx), CD4 10, ID awaiting genotyping to start ART.  Goal of Therapy:  Appropriate antibiotic dosing Clinical resolution of infection  Plan:  1) Zosyn 2.25g IV q8h 2) Ganciclovir 55 mg (1.25mg /kg) q24h 3) Bactrim 200mg  q12 (5mg /kg q12)  4) Fluconazole 200mg  per tube q24h (thrush) 5) F/u renal function closely - BMET at 1500  Christoper Fabianaron Uzma Hellmer, PharmD, BCPS Clinical pharmacist, pager [phone] 02/04/2014,10:25 AM   Addendum:  Lab called Dr. Drue SecondSnider with positive CMV pcr and acyclovir was changed to ganciclovir - however lab never resulted and when Dr. Drue SecondSnider further investigated, lab cannot confirm this and appears to have been reported in  error. Discussed with Dr. Drue SecondSnider and orders to restart Acyclovir for HSV encephalitis (Day #7 antiviral coverage). Also noted PCP smear from 10/28 is positive.  Plan: 1) Restart Acyclovir 400mg  IV q24h. Next dose 10/30 1200 (ganciclovir will cover for 24 hrs). 2) Will continue to follow.  Christoper Fabianaron Eunice Oldaker, PharmD, BCPS Clinical pharmacist, pager [phone] 02/04/2014 3:51 PM

## 2014-02-04 NOTE — Progress Notes (Signed)
  Prelim results shows that she has CMV viremia. Concern that this is also contributing to respiratory insult.  - will change acyclovir to ganciclovir which would cover HSV encephalitis. Full progress note to follow. Spoke to pharmacy to discuss these changes  Aram BeechamCynthia B. Drue SecondSnider MD MPH [facility] [phone]

## 2014-02-04 NOTE — Progress Notes (Addendum)
[facility]    Date of Admission:  01/27/2014   Total days of antibiotics 7        Day 3 Diflucan        Day 7 Zosyn        Day 7 Acyclovir        DAy 3 bactrim        - weekly Azithromycin    ID: Desiree Hale is a 43 y.o. female with  presented with AMS, seizure like activity, unintentional weightloss.  Being managed for acute encephalopathy, found to have advanced HIV disease with CD4- 10, and Viral load- 251 358. 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 Decompensated and transferred to ICU- 10/28, intubated 10/28. Sputum 10/28- postive for PCP. Bronchoscopy done- 10/29.  Principal Problem:   Acute respiratory failure Active Problems:   Altered mental state   HTN (hypertension)   Tachycardia   Metabolic acidosis   Hyperglycemia   Acute renal failure   Microcytic anemia   Hypercalcemia   Aspiration pneumonia   Seizures  Subjective: Still spiking high fevers- 102.4 this am. Was intubated due to increased work of breathing, AMS with tachypnea and tachycardia. No family at bedside. Responds to name, by opening eyes, then drifts back to sleep. No response to commands.  Medications:  . antiseptic oral rinse  7 mL Mouth Rinse q12n4p  . aspirin  81 mg Oral Daily  . [START ON 02/08/2014] azithromycin  1,200 mg Per Tube Weekly  . chlorhexidine  15 mL Mouth Rinse BID  . fluconazole  200 mg Per Tube Daily  . heparin subcutaneous  5,000 Units Subcutaneous 3 times per day  . levETIRAcetam  500 mg Intravenous Q12H  . pantoprazole sodium  40 mg Per Tube Q1200  . piperacillin-tazobactam (ZOSYN)  IV  2.25 g Intravenous Q8H  . sodium chloride  3 mL Intravenous Q12H  . sulfamethoxazole-trimethoprim  5 mg/kg of trimethoprim Intravenous Q12H    Objective: Vital signs in last 24 hours: Temp:  [97.9 F (36.6 C)-102.4 F (39.1 C)] 99 F (37.2 C) (10/29 1510) Pulse Rate:  [79-142] 97 (10/29 1525) Resp:  [13-37] 28 (10/29 1525) BP:  (96-181)/(63-115) 134/75 mmHg (10/29 1500) SpO2:  [95 %-100 %] 99 % (10/29 1525) FiO2 (%):  [28 %-60 %] 60 % (10/29 1225) Weight:  [99 lb 3.3 oz (45 kg)] 99 lb 3.3 oz (45 kg) (10/29 0500)  Physical Exam-  General appearance: appears  Older than stated age and lethargic, appears malnourished. No obeying commands. Head: Normocephalic, intubated. Pulm= Coarse breath sounds. GI: soft, no guarding, no organomegaly, rectal tube draining dark liquid stool- >147ms. Extremities: extremities normal, atraumatic, no cyanosis or edema, boot on feet. Pulses: 2+ and symmetric Skin: warm.  Lab Results  Recent Labs  02/03/14 1210 02/04/14 0245  WBC 12.8* 9.7  HGB 9.0* 9.3*  HCT 27.0* 27.7*  NA 134* 135*  K 3.6* 4.1  CL 102 102  CO2 16* 16*  BUN 37* 36*  CREATININE 2.14* 2.27*   Microbiology: 10/21- Urine culture- NG- Final 10/23- Blood culture X2 - NGTD >>  10/23 - stool studies negative for cdiff, giardia, cryptosporidium 10/23- MRSA positive. 10/24- AFB culture blood- NGTD >> 10/24- CSF gram stain- No org seen, WBC present. 10/24- CSF culture- NGTD >> 10/26- Fungal culture >> 10/27- Blood cultures X2 >> 10/28- Sputum- Positive for PCP. 10/29- Tracheal aspirate culture- >> 10/29- BAL ->>  Studies/Results: Dg Abd Portable 1v  01/31/2014  CLINICAL DATA:  NG tube placement.  EXAM: PORTABLE ABDOMEN - 1 VIEW  COMPARISON:  None.  FINDINGS: Bowel gas pattern is nonobstructive. A feeding tube follows the greater curvature of the stomach and terminates in the expected location of the antrum/pylorus.  IMPRESSION: Feeding tube terminates in the expected location of the antrum/pylorus.   Electronically Signed   By: Curlene Dolphin M.D.   On: 01/31/2014 18:58   Dg Fluoro Guide Lumbar Puncture  01/30/2014   CLINICAL DATA:  43 year old female with fever and altered mental status. Suspicious for encephalitis.  IMPRESSION: 1. Successful uncomplicated fluoroscopic guided lumbar puncture at L3-L4  with opening pressure of 17 cm of water, yielding consistently light yellow tinged CSF.   Electronically Signed   By: Vinnie Langton M.D.   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 Placed on broad spectrum for aspiration pneumonia, now with dx PCP pneumonia. On OI proph with azithromycin. Transferred to ICU and intubated- 10/28.   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 CSF- Increased protein and lymphocytes. CSF HSV 2 + by PCR. CSF Crypto, Gram stain and cultures so far negative.  - Neurology following. -  Continue Acyclovir   Aspiration PNA -  currently on day #7 Zosyn today.  Consider stopping piptazo and just treat for PCP with high dose bactrim  PCP Pneumonia - Positive sputum PCP- 10/28. Increased LDH. On IV Bactrim- per renal dosing - Monitor kidney function. May need to dose reduce if CKD worsens -  Consider steroids,   CKD =  Will need to dose adjust meds tomorrow since Cr continues to worsen in the course of the day. It likely due to various nephrotoxicity SE of many drugs such as acyclovir and TMP. Will check urine electrolytes. Consider renal consult if continues to worsen.  HIV- AIDS- Viral load- 251 358. CD4- 10,  HIV genotype pending. - awaiting to results from [place] (anticipated to 10/30) so that we can start empiric treatment ,want to avoid tenofovir due to ckd.  - alternatively, need to think of regimen that can be delivered by NG tube since not all medicines can be crushed.  OI proph = continue on MAC prophylaxis with Azithromycin weekly   Oral thrush- currently on Fluconazole, esophagitis dosing  AIDs with Diarrhea- Giardia and cryptosporidium negative, C. Diff, GI pathogen panel negative. Differentials- Disseminated MAC, CMV colitis, HIV related enteropathy - continue to support for volume loss  Fevers : fungal and afb  blood cultures collected. Not likely to be [place] (hemophagocytic lymphohistiocytosis).since criteria is not met.  Lipid profile- TG- 120, ferritin- 1050 - initially lab reported + CMV VL that was confirmed to be erroneous, unfortunately, patient has received a dose of ganciclovir based upon verbal report. I will place a safety zone report to this error. CSF(from prior LP) CMV VL is being sent out in the morning to [place] lab? but will send out CMV VL on serum tomorrow - repeat blood culture and fungal cultures, AFB pending.  HIV Disclosure: disclosed her HIV + positive status to the patient- 10/27, who appeared like she understood very briefly but was unable to tell us who else she would like [place] to speak with.   Severe protein-caloric malnutrition = continue with nutritional support through ng  [place], [place]  PGY-2.  IMTS.  Pager- [phone]  ------------------------------------------ I have personally examined and reviewed results of the  patient. I have discussed assessment/plan and agree with plan as described by Dr. Golden [facility] [phone]

## 2014-02-04 NOTE — Progress Notes (Signed)
   ABG on vent RR 14, VT 500, peep 5     Recent Labs Lab 01/29/14 0633 02/04/14  PHART 7.369 7.159*  PCO2ART 22.9* 51.1*  PO2ART 71.8* 140.0*  HCO3 12.9* 17.0*  TCO2 13.6 18.5  O2SAT 92.7 97.5     PLAN  - increase RR from 14 to 24 and recheck abg in 1h  Dr. Kalman ShanMurali Emmanuel Gruenhagen, M.D., [facility] HospitalF.C.C.P Pulmonary and Critical Care Medicine Staff Physician [facility] [place] [facility] Pager: [phone], If no answer or between  15:00h - 7:00h: call [phone]  02/04/2014 1:22 AM

## 2014-02-04 NOTE — Procedures (Signed)
Bronchoscopy Procedure Note Beryle BeamsKristy Stilley [id number] December 27, 1970  Procedure: Bronchoscopy Indications: Diagnostic evaluation of the airways and Obtain specimens for culture and/or other diagnostic studies  Procedure Details Consent: Risks of procedure as well as the alternatives and risks of each were explained to the (patient/caregiver).  Consent for procedure obtained. Time Out: Verified patient identification, verified procedure, site/side was marked, verified correct patient position, special equipment/implants available, medications/allergies/relevent history reviewed, required imaging and test results available.  Performed  In preparation for procedure, patient was given 100% FiO2 and bronchoscope lubricated. Sedation: tracheobronchial tree  Airway entered and the following bronchi were examined: Bronchi.   Procedures performed: BAL performed Bronchoscope removed.  , Patient placed back on 100% FiO2 at conclusion of procedure.    Evaluation Hemodynamic Status: BP stable throughout; O2 sats: stable throughout Patient's Current Condition: stable Specimens:  Sent serosanguinous fluid for afb, fungal, culture, PCP & resp viral panel Complications: No apparent complications Patient did tolerate procedure well.   Sherlonda Flater V. 02/04/2014

## 2014-02-04 NOTE — Progress Notes (Signed)
OT Cancellation Note  Patient Details Name: Desiree BeamsKristy Hale MRN: [id number] DOB: November 25, 1970   Cancelled Treatment:    Reason Eval/Treat Not Completed: Medical issues which prohibited therapy. Pt sedated and intubated since OT evaluation.  Signing off.  Please reorder as appropriate.  Evern BioMayberry, Kodey Xue Lynn 02/04/2014, 9:28 AM [phone]

## 2014-02-04 NOTE — Procedures (Signed)
Central Venous Catheter Insertion Procedure Note Desiree BeamsKristy Ehlert [id number] 03-13-1971  Procedure: Insertion of Central Venous Catheter Indications: Assessment of intravascular volume, Drug and/or fluid administration and Frequent blood sampling  Procedure Details Consent: Risks of procedure as well as the alternatives and risks of each were explained to the (patient/caregiver).  Consent for procedure obtained. Time Out: Verified patient identification, verified procedure, site/side was marked, verified correct patient position, special equipment/implants available, medications/allergies/relevent history reviewed, required imaging and test results available.  Performed  Maximum sterile technique was used including antiseptics, cap, gloves, gown, hand hygiene, mask and sheet. Skin prep: Chlorhexidine; local anesthetic administered A antimicrobial bonded/coated triple lumen catheter was placed in the left internal jugular vein using the Seldinger technique.  Evaluation Blood flow good Complications: No apparent complications Patient did tolerate procedure well. Chest X-ray ordered to verify placement.  CXR: pending.  Procedure performed under direct ultrasound guidance for real time vessel cannulation.      Rutherford Guysahul Desai, PA - [facility] [place] [facility] Pgr: [phone]  or [phone]  Supervised by me  ALVA,RAKESH V.  02/04/2014, 12:12 AM

## 2014-02-04 NOTE — Progress Notes (Signed)
eLink Physician-Brief Progress Note Patient Name: Desiree BeamsKristy Hale DOB: 1970/12/17 MRN: [id number]   Date of Service  02/04/2014  HPI/Events of Note  BAL positive for PCP  eICU Interventions  Continue bactrim IV BID     Intervention Category Major Interventions: Infection - evaluation and management  Harumi Yamin 02/04/2014, 3:48 PM

## 2014-02-04 NOTE — Progress Notes (Addendum)
Recent Info charted regarding cpap was incorrect. Info meant for different pt.

## 2014-02-05 ENCOUNTER — Inpatient Hospital Stay (HOSPITAL_COMMUNITY): Payer: Medicaid Other

## 2014-02-05 DIAGNOSIS — N170 Acute kidney failure with tubular necrosis: Secondary | ICD-10-CM

## 2014-02-05 LAB — GLUCOSE, CAPILLARY
GLUCOSE-CAPILLARY: 249 mg/dL — AB (ref 70–99)
Glucose-Capillary: 144 mg/dL — ABNORMAL HIGH (ref 70–99)
Glucose-Capillary: 119 mg/dL — ABNORMAL HIGH (ref 70–99)
Glucose-Capillary: 195 mg/dL — ABNORMAL HIGH (ref 70–99)

## 2014-02-05 LAB — COMPREHENSIVE METABOLIC PANEL
ALK PHOS: 87 U/L (ref 39–117)
ALT: <5 U/L (ref 0–35)
ANION GAP: 17 — AB (ref 5–15)
AST: 12 U/L (ref 0–37)
Albumin: 1.4 g/dL — ABNORMAL LOW (ref 3.5–5.2)
BUN: 43 mg/dL — AB (ref 6–23)
CO2: 14 mEq/L — ABNORMAL LOW (ref 19–32)
Calcium: 8.1 mg/dL — ABNORMAL LOW (ref 8.4–10.5)
Chloride: 102 mEq/L (ref 96–112)
Creatinine, Ser: 2.83 mg/dL — ABNORMAL HIGH (ref 0.50–1.10)
GFR calc non Af Amer: 19 mL/min — ABNORMAL LOW (ref >90)
GFR, EST AFRICAN AMERICAN: 22 mL/min — AB (ref >90)
GLUCOSE: 149 mg/dL — AB (ref 70–99)
Potassium: 3.9 mEq/L (ref 3.7–5.3)
Sodium: 133 mEq/L — ABNORMAL LOW (ref 137–147)
TOTAL PROTEIN: 5.8 g/dL — AB (ref 6.0–8.3)
Total Bilirubin: 0.3 mg/dL (ref 0.3–1.2)

## 2014-02-05 LAB — BLOOD GAS, ARTERIAL
ACID-BASE DEFICIT: 9.9 mmol/L — AB (ref 0.0–2.0)
BICARBONATE: 17 meq/L — AB (ref 20.0–24.0)
DRAWN BY: 36496
FIO2: 0.6 %
LHR: 14 {breaths}/min
MECHVT: 500 mL
O2 Saturation: 97.5 %
PEEP/CPAP: 5 cmH2O
PO2 ART: 140 mmHg — AB (ref 80.0–100.0)
Patient temperature: 101
TCO2: 18.5 mmol/L (ref 0–100)
pCO2 arterial: 51.1 mmHg — ABNORMAL HIGH (ref 35.0–45.0)
pH, Arterial: 7.159 — CL (ref 7.350–7.450)

## 2014-02-05 LAB — HLA B*5701: HLA B 5701: NEGATIVE

## 2014-02-05 LAB — CBC
HEMATOCRIT: 23 % — AB (ref 36.0–46.0)
HEMOGLOBIN: 7.6 g/dL — AB (ref 12.0–15.0)
MCH: 25.7 pg — ABNORMAL LOW (ref 26.0–34.0)
MCHC: 33 g/dL (ref 30.0–36.0)
MCV: 77.7 fL — ABNORMAL LOW (ref 78.0–100.0)
Platelets: 183 10*3/uL (ref 150–400)
RBC: 2.96 MIL/uL — ABNORMAL LOW (ref 3.87–5.11)
RDW: 17.5 % — AB (ref 11.5–15.5)
WBC: 6.2 10*3/uL (ref 4.0–10.5)

## 2014-02-05 LAB — URINALYSIS, ROUTINE W REFLEX MICROSCOPIC
Bilirubin Urine: NEGATIVE
Glucose, UA: NEGATIVE mg/dL
Ketones, ur: NEGATIVE mg/dL
Leukocytes, UA: NEGATIVE
Nitrite: NEGATIVE
Protein, ur: 30 mg/dL — AB
SPECIFIC GRAVITY, URINE: 1.016 (ref 1.005–1.030)
UROBILINOGEN UA: 0.2 mg/dL (ref 0.0–1.0)
pH: 6 (ref 5.0–8.0)

## 2014-02-05 LAB — URINE MICROSCOPIC-ADD ON

## 2014-02-05 LAB — SODIUM, URINE, RANDOM: SODIUM UR: 103 meq/L

## 2014-02-05 LAB — PHOSPHORUS: PHOSPHORUS: 2.8 mg/dL (ref 2.3–4.6)

## 2014-02-05 LAB — OSMOLALITY, URINE: OSMOLALITY UR: 390 mosm/kg (ref 390–1090)

## 2014-02-05 LAB — MAGNESIUM: MAGNESIUM: 1.7 mg/dL (ref 1.5–2.5)

## 2014-02-05 MED ORDER — METHYLPREDNISOLONE SODIUM SUCC 40 MG IJ SOLR
40.0000 mg | Freq: Two times a day (BID) | INTRAMUSCULAR | Status: DC
  Administered 2014-02-05 – 2014-02-15 (×21): 40 mg via INTRAVENOUS
  Filled 2014-02-05 (×22): qty 1

## 2014-02-05 MED ORDER — ETHAMBUTOL HCL 400 MG PO TABS
800.0000 mg | ORAL_TABLET | ORAL | Status: DC
  Administered 2014-02-05 – 2014-02-14 (×10): 800 mg
  Filled 2014-02-05 (×11): qty 2

## 2014-02-05 MED ORDER — PRIMAQUINE PHOSPHATE 26.3 MG PO TABS
30.0000 mg | ORAL_TABLET | ORAL | Status: DC
  Administered 2014-02-05 – 2014-02-22 (×18): 30 mg
  Filled 2014-02-05 (×21): qty 2

## 2014-02-05 MED ORDER — FENTANYL BOLUS VIA INFUSION
50.0000 ug | INTRAVENOUS | Status: DC | PRN
  Administered 2014-02-08 (×2): 100 ug via INTRAVENOUS
  Filled 2014-02-05: qty 100

## 2014-02-05 MED ORDER — ETHAMBUTOL HCL 400 MG PO TABS
800.0000 mg | ORAL_TABLET | ORAL | Status: DC
  Filled 2014-02-05: qty 2

## 2014-02-05 MED ORDER — SODIUM CHLORIDE 0.9 % IV SOLN
0.0000 ug/h | INTRAVENOUS | Status: DC
  Administered 2014-02-05 – 2014-02-09 (×4): 150 ug/h via INTRAVENOUS
  Filled 2014-02-05 (×6): qty 50

## 2014-02-05 MED ORDER — FENTANYL CITRATE 0.05 MG/ML IJ SOLN
50.0000 ug | Freq: Once | INTRAMUSCULAR | Status: AC
  Administered 2014-02-05: 50 ug via INTRAVENOUS

## 2014-02-05 MED ORDER — MAGNESIUM SULFATE 40 MG/ML IJ SOLN
2.0000 g | Freq: Once | INTRAMUSCULAR | Status: AC
  Administered 2014-02-05: 2 g via INTRAVENOUS
  Filled 2014-02-05: qty 50

## 2014-02-05 MED ORDER — LABETALOL HCL 100 MG PO TABS
100.0000 mg | ORAL_TABLET | Freq: Two times a day (BID) | ORAL | Status: DC
Start: 2014-02-05 — End: 2014-02-09
  Administered 2014-02-05 – 2014-02-09 (×8): 100 mg via ORAL
  Filled 2014-02-05 (×10): qty 1

## 2014-02-05 MED ORDER — CETYLPYRIDINIUM CHLORIDE 0.05 % MT LIQD
7.0000 mL | OROMUCOSAL | Status: DC
  Administered 2014-02-05 – 2014-02-07 (×13): 7 mL via OROMUCOSAL

## 2014-02-05 MED ORDER — DEXTROSE 5 % IV SOLN
240.0000 mg | Freq: Two times a day (BID) | INTRAVENOUS | Status: DC
  Administered 2014-02-05: 240 mg via INTRAVENOUS
  Filled 2014-02-05 (×2): qty 15

## 2014-02-05 MED ORDER — RIFABUTIN 150 MG PO CAPS
150.0000 mg | ORAL_CAPSULE | Freq: Every day | ORAL | Status: DC
  Administered 2014-02-05 – 2014-02-15 (×11): 150 mg via ORAL
  Filled 2014-02-05 (×11): qty 1

## 2014-02-05 MED ORDER — AZITHROMYCIN 200 MG/5ML PO SUSR
600.0000 mg | Freq: Every day | ORAL | Status: DC
  Administered 2014-02-05 – 2014-02-13 (×9): 600 mg
  Filled 2014-02-05 (×9): qty 15

## 2014-02-05 MED ORDER — CLINDAMYCIN PHOSPHATE 600 MG/50ML IV SOLN
600.0000 mg | Freq: Three times a day (TID) | INTRAVENOUS | Status: DC
  Administered 2014-02-05 – 2014-02-15 (×29): 600 mg via INTRAVENOUS
  Filled 2014-02-05 (×33): qty 50

## 2014-02-05 NOTE — Progress Notes (Addendum)
NEURO HOSPITALIST PROGRESS NOTE   SUBJECTIVE:                                                                                                                        No overnight events. Resting comfortably. S/P bronchoscopy on 10/29  CSF WBC 78, RBC 1479, protein 399, glucose 85. Gram stain negative. Cryptococcal antigen negative. CD4 count low, HIV reactive, HIV viral load 251,358.  CSF VDRL -non reactive, CMV, JCV- not detected CSF Surgical pathology- Mixed inflammatory cells.    OBJECTIVE:                                                                                                                           Vital signs in last 24 hours: Temp:  [99 F (37.2 C)-102.4 F (39.1 C)] 100.4 F (38 C) (10/30 0416) Pulse Rate:  [78-115] 106 (10/30 0600) Resp:  [23-31] 27 (10/30 0600) BP: (96-172)/(63-100) 172/98 mmHg (10/30 0600) SpO2:  [97 %-100 %] 100 % (10/30 0600) FiO2 (%):  [60 %] 60 % (10/30 0325) Weight:  [47.5 kg (104 lb 11.5 oz)] 47.5 kg (104 lb 11.5 oz) (10/30 0500)  Intake/Output from previous day: 10/29 0701 - 10/30 0700 In: 2644.7 [I.V.:989.7; NG/GT:870; IV Piggyback:700] Out: 650 [Urine:650] Intake/Output this shift:   Nutritional status: NPO  Past Medical History  Diagnosis Date  . Hypertension   . HIV (human immunodeficiency virus infection)      Neurologic Exam:  Patient is intubated, on sedation. Minimally opens eyes to noxious stimuli but quickly falls back asleep Pupils were equal and reacted normally to light.  Eyes midline She appear to have moderate right lower facial weakness.  Slight increase in muscle tone throughout.Resistance to passive movement was symmetrical. No withdrawal to noxious stimuli.  Deep tendon reflexes were 1+ and symmetrical.  Plantar responses were flexor bilaterally    Lab Results: Lab Results  Component Value Date/Time   CHOL 120 02/01/2014  5:01 AM   Lipid Panel No results found  for this basename: CHOL, TRIG, HDL, CHOLHDL, VLDL, LDLCALC,  in the last 72 hours  Studies/Results: Dg Chest Portable 1 View  02/04/2014   CLINICAL DATA:  Pneumonia  EXAM: PORTABLE CHEST - 1 VIEW  COMPARISON:  02/03/2014  FINDINGS: Cardiac shadow is stable. A left jugular central line, feeding catheter and endotracheal tube are again seen and stable in position. The endotracheal tube lies 3.8 cm above the carina. There is been improved aeration in the right lung base. Persistent left basilar changes are noted. Continued followup is recommended.  IMPRESSION: Improved aeration in the right lung base. Stable changes in the left lung base are seen.  Tubes and lines as described.   Electronically Signed   By: Alcide Clever M.D.   On: 02/04/2014 07:37   Portable Chest Xray  02/04/2014   CLINICAL DATA:  Line placement.  Respiratory failure.  EXAM: PORTABLE CHEST - 1 VIEW  COMPARISON:  02/03/2014 at 7:34 a.m.  FINDINGS: New left IJ catheter, tip at the upper cavoatrial junction. No evidence of pneumothorax.  New endotracheal tube, tip 1 cm above the carina. Feeding tube remains at least in the stomach.  Greater lung volumes after intubation. Bilateral airspace disease persists, focal in the right infrahilar lung can suspicious for pneumonia. No evidence of effusion or pneumothorax.  IMPRESSION: 1. New endotracheal tube, tip 1 cm above the carina. 2. New left IJ catheter is in good position.  No pneumothorax. 3. Improved lung volumes with persistent bilateral airspace disease (likely pneumonia).   Electronically Signed   By: Tiburcio Pea M.D.   On: 02/04/2014 01:07    MEDICATIONS                                                                                                                        Scheduled: . acyclovir  10 mg/kg (Order-Specific) Intravenous Q24H  . antiseptic oral rinse  7 mL Mouth Rinse 6 times per day  . aspirin  81 mg Oral Daily  . [START ON 02/08/2014] azithromycin  1,200 mg Per Tube  Weekly  . chlorhexidine  15 mL Mouth Rinse BID  . fluconazole  100 mg Per Tube Daily  . heparin subcutaneous  5,000 Units Subcutaneous 3 times per day  . levETIRAcetam  500 mg Intravenous Q12H  . pantoprazole sodium  40 mg Per Tube Q1200  . sodium chloride  3 mL Intravenous Q12H  . sulfamethoxazole-trimethoprim  5 mg/kg of trimethoprim Intravenous Q12H    ASSESSMENT/PLAN:                                                                                                            43 y/o with new onset encephalopathy and probable seizure. Profound weight loss. Acute encephalopathy likely secondary to HSV encephalitis with HSV 2 +  PCR. Mental status stable. No signs of seizure activity.   -continue acyclovir  -continue keppra 500mg  BID -please call with further questions   Elspeth Choeter Hikaru Delorenzo, DO [facility] [phone]  If 7pm- 7am, please page neurology on call as listed in AMION.   02/05/2014, 7:44 AM

## 2014-02-05 NOTE — Progress Notes (Addendum)
[facility] for Infectious Disease    Date of Admission:  01/27/2014   Total days of antibiotics 8        Day 3 Diflucan              Day 8 Acyclovir        DAy 4 IV bactrim                                                                                     Zosyn D/cd after 7 days        - weekly Azithromycin    ID: Desiree Hale is a 43 y.o. female with  presented with AMS, seizure like activity, unintentional weightloss.  Being managed for acute encephalopathy, found to have advanced HIV disease with CD4- 10, and Viral load- 251 358. 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 Decompensated and transferred to ICU- 10/28, intubated 10/28. Sputum 10/28- positive for PCP. Bronchoscopy done- 10/29.  Principal Problem:   Acute respiratory failure Active Problems:   Altered mental state   HTN (hypertension)   Tachycardia   Metabolic acidosis   Hyperglycemia   Acute renal failure   Microcytic anemia   Hypercalcemia   Aspiration pneumonia   Seizures  Subjective: ongoing fevers for the past 8 days- 101 this am. Sedated on vent. Started on steroids due to PCP. Family not present.  Medications:  . acyclovir  10 mg/kg (Order-Specific) Intravenous Q24H  . antiseptic oral rinse  7 mL Mouth Rinse 6 times per day  . aspirin  81 mg Oral Daily  . [START ON 02/08/2014] azithromycin  1,200 mg Per Tube Weekly  . chlorhexidine  15 mL Mouth Rinse BID  . fluconazole  100 mg Per Tube Daily  . heparin subcutaneous  5,000 Units Subcutaneous 3 times per day  . labetalol  100 mg Oral BID  . levETIRAcetam  500 mg Intravenous Q12H  . methylPREDNISolone (SOLU-MEDROL) injection  40 mg Intravenous Q12H  . pantoprazole sodium  40 mg Per Tube Q1200  . sodium chloride  3 mL Intravenous Q12H  . sulfamethoxazole-trimethoprim  240 mg of trimethoprim Intravenous Q12H    Objective: Vital signs in last 24 hours: Temp:  [99 F (37.2 C)-101.3 F (38.5 C)] 99 F (37.2 C) (10/30 1218) Pulse  Rate:  [78-115] 96 (10/30 1300) Resp:  [20-30] 28 (10/30 1300) BP: (118-188)/(75-100) 135/79 mmHg (10/30 1300) SpO2:  [99 %-100 %] 100 % (10/30 1300) FiO2 (%):  [60 %] 60 % (10/30 1200) Weight:  [104 lb 11.5 oz (47.5 kg)] 104 lb 11.5 oz (47.5 kg) (10/30 0500)  Physical Exam-  General appearance: Sedated, On vent, no response to pain, appears Older than stated age and lethargic, malnourished. Not obeying commands. Head: Normocephalic, intubated. Pulm= Coarse breath sounds. GI: soft, no guarding, no organomegaly, rectal tube still draining dark liquid stool- ~320ms. Extremities: extremities normal, atraumatic, no cyanosis or edema, boot on feet. Pulses: 2+ and symmetric Skin: warm.  Lab Results  Recent Labs  02/04/14 0245 02/04/14 1500 02/05/14 0226  WBC 9.7  --  6.2  HGB 9.3*  --  7.6*  HCT 27.7*  --  23.0*  NA 135* 136* 133*  K 4.1 3.9 3.9  CL 102 103 102  CO2 16* 15* 14*  BUN 36* 40* 43*  CREATININE 2.27* 2.65* 2.83*   Microbiology: 10/21- Urine culture- NG- Final 10/23- Blood culture X2 - NG FINAL   10/23 - stool studies negative for cdiff, giardia, cryptosporidium 10/23- MRSA positive. 10/24- AFB culture blood- NGTD >> 10/24- CSF gram stain- No org seen, WBC present. 10/24- CSF culture- NGTD >> 10/24 CSF CMV VL <200 10/26- Fungal culture NG >> 10/27- Blood cultures X2 >> 10/28- Sputum- Positive for PCP. 10/29- Tracheal aspirate culture- >> 10/29- BAL ->> 10/29- Sputum AFB >>  Studies/Results: Dg Abd Portable 1v  01/31/2014   CLINICAL DATA:  NG tube placement.  EXAM: PORTABLE ABDOMEN - 1 VIEW  COMPARISON:  None.  FINDINGS: Bowel gas pattern is nonobstructive. A feeding tube follows the greater curvature of the stomach and terminates in the expected location of the antrum/pylorus.  IMPRESSION: Feeding tube terminates in the expected location of the antrum/pylorus.   Electronically Signed   By: Curlene Dolphin M.D.   On: 01/31/2014 18:58   Dg Fluoro Guide Lumbar  Puncture  01/30/2014   CLINICAL DATA:  43 year old female with fever and altered mental status. Suspicious for encephalitis.  IMPRESSION: 1. Successful uncomplicated fluoroscopic guided lumbar puncture at L3-L4 with opening pressure of 17 cm of water, yielding consistently light yellow tinged CSF.   Electronically Signed   By: Vinnie Langton M.D.   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 Placed on broad spectrum for aspiration pneumonia, now with dx PCP pneumonia. On OI proph with azithromycin. Transferred to ICU and intubated- 10/28.   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 CSF- Increased protein and lymphocytes. CSF HSV 2 + by PCR. CSF Crypto, Gram stain and cultures so far negative.   -  Continue Acyclovir, renally dosed  Aspiration PNA -  currently on day #9 Zosyn today.  Recommend to stop piptazo and just treat for PCP with high dose bactrim  PCP Pneumonia - On IV Bactrim- per renal dosing - Monitor kidney function. May need to dose reduce if CKD worsens - Agree with steroids - will change to alternative treatment due to AKI , will change to primaquine and clindamycin starting on 02/06/14  CKD =  Will need to dose adjust meds tomorrow since Cr continues to worsen in the course of the day. It likely due to various nephrotoxicity SE of many drugs such as acyclovir and TMP. Consider renal consult if continues to worsen.  HIV- AIDS- Viral load- 251 358. CD4- 10,  HIV genotype pending. - awaiting to results from [place] (anticipated to 10/30)  so that we can start empiric treatment ,want to avoid tenofovir due to ckd.  - alternatively, need to think of regimen that can be delivered by NG tube since not all medicines can be crushed.  AFB on BAL = recommend to repeat further specimens. Will ask lab to add Mtb PCR and MAC PCR. Likely the later.  Given she has fever for 8 days and 3 days despite PCP treatment we will also empirically treat for MAC with clarithromycin, ethambutol and rifampin  Oral thrush- currently on Fluconazole, esophagitis dosing  AIDs with Diarrhea- Giardia and cryptosporidium negative, C. Diff, GI pathogen panel negative. Differentials- Disseminated MAC, CMV colitis, HIV related enteropathy - continue to  support for volume loss  Fevers : fungal and afb blood cultures collected. Not likely to be [place] (hemophagocytic lymphohistiocytosis).since criteria is not met.  Lipid profile- TG- 120, ferritin- 1050 - due to ongoing fevers, we will start empiric treatment for MAC.  - repeat blood culture and fungal cultures, AFB pending.  HIV Disclosure: disclosed her HIV + positive status to the patient- 10/27, who appeared like she understood very briefly but was unable to tell us who else she would like [place] to speak with.   Severe protein-caloric malnutrition = continue with nutritional support through ng  [place], [employer]  PGY-2.  IMTS.  Pager- [phone]  ----------------------------------------------- I have personally examined and reviewed results. I have discussed assessment and plan as outlined by Dr. Denton Brick.  i spoke with [facility]U pharmacist and Dr. Titus Mould about plans and changes to antibiotics for the day

## 2014-02-05 NOTE — Progress Notes (Addendum)
ANTIBIOTIC CONSULT NOTE - FOLLOW UP  Pharmacy Consult for Bactrim ->Clinda and Primaquine, Acyclovir, Fluconazole Indication:  PCP, HSV encephalitis, thrush  Allergies  Allergen Reactions  . Oxycodone Other (See Comments)   Patient Measurements: Height: 5\' 7"  (170.2 cm) Weight: 104 lb 11.5 oz (47.5 kg) IBW/kg (Calculated) : 61.6  Vital Signs: Temp: 99 F (37.2 C) (10/30 1218) Temp Source: Oral (10/30 1218) BP: 135/79 mmHg (10/30 1300) Pulse Rate: 96 (10/30 1300) Intake/Output from previous day: 10/29 0701 - 10/30 0700 In: 3587.7 [I.V.:1897.7; NG/GT:905; IV Piggyback:700] Out: 650 [Urine:650] Intake/Output from this shift: Total I/O In: 589.7 [I.V.:345.5; NG/GT:194.2; IV Piggyback:50] Out: 470 [Urine:470]  Labs:  Recent Labs  02/03/14 1210 02/04/14 0245 02/04/14 1500 02/05/14 0226  WBC 12.8* 9.7  --  6.2  HGB 9.0* 9.3*  --  7.6*  PLT 157 158  --  183  CREATININE 2.14* 2.27* 2.65* 2.83*   Estimated Creatinine Clearance: 19 ml/min (by C-G formula based on Cr of 2.83).   Assessment: 43yof with newly diagnosed HIV. Pt on Acyclovir (Day #8) for HSV encephalitis, Bactrim (Day #5) for PCP, Azithromycin for MAC px. S/p Zosyn x 7d for asp pna. Still spiking fevers- Tm 102.4, Tc 101.3. WBC trending down. ID on board. HIV + (new dx) - awaiting genotyping to start ART. Steroids added for PCP.  Vancomycin 10/23>>10/28 Zosyn 10/23>> Acyclovir 10/24>> PO Fluconazole (thrush) 10/27>> PO Azith (MAC px) 10/26>> PO Bactrim (PCP px) 10/26>>10/27 IV Bactrim (PCP tx) 10/27>> Ganciclovir (CMV/HSV) 10/29 x1 - in error, lab called Dr. Drue SecondSnider with +CMV pcr (but never resulted and when lab called back they couldn't find?)  Renal: ARF, SCr now trending back up 2.83 (pk 3.07 on 10/21), est CrCl 18 ml/min. UOP down to 0.6 ml/kg/hr. Noted pt with multiple nephrotoxic agents on board.  Goal of Therapy:  Appropriate antibiotic dosing Clinical resolution of infection  Plan:  Increase  Bactrim to 240mg  q12 (5mg /kg q12)  Continue Fluconazole 100mg  PT q24h (thrush) Continue Acyclovir 400mg  IV q24h F/u renal function, micro data, ART initiation  Christoper Fabianaron Jhane Lorio, PharmD, BCPS Clinical pharmacist, pager [phone] 02/05/2014,1:47 PM  Addendum [phone]): Dr. Drue SecondSnider would like to change Bactrim to Clindamycin and Primaquine for PCP treatment since renal function continues to worsen. She would also like to start MAC coverage with ethambutol, azithromycin, and rifabutin.  Plan: 1) Clindamycin 600mg  IV q8h 2) Primaquine 30mg  per tube daily 3) Change Azithromycin to 600mg  per tube daily 4) Ethambutol 800mg  per tube daily 5) Rifabutin 150mg  per tube daily  Christoper Fabianaron Cay Kath, PharmD, BCPS Clinical pharmacist, pager [phone] 02/05/2014  3:37 PM

## 2014-02-05 NOTE — Progress Notes (Signed)
   Per RN - when bronch was done AFB sample was not sent. FYI  PLAN Will pass on to morning team  Dr. Kalman ShanMurali Shiquita Collignon, M.D., [facility] OakF.C.C.P Pulmonary and Critical Care Medicine Staff Physician [facility] Pager: [phone], If no answer or between  15:00h - 7:00h: call [phone]  02/05/2014 12:50 AM

## 2014-02-05 NOTE — Progress Notes (Signed)
SLP Cancellation Note  Patient Details Name: Desiree Hale MRN: [id number] DOB: Jun 27, 1970   Cancelled treatment:       Reason Eval/Treat Not Completed: Medical issues which prohibited therapy. Will sign off, please reorder when needed.    Hennessey Cantrell, Riley NearingBonnie Caroline 02/05/2014, 7:42 AM

## 2014-02-05 NOTE — Progress Notes (Addendum)
PULMONARY / CRITICAL CARE MEDICINE   Name: Desiree Hale MRN: [id number] DOB: 02/12/1971    ADMISSION DATE:  01/27/2014 CONSULTATION DATE:  02/03/14  REFERRING MD :  [facility]   CHIEF COMPLAINT:  AMS, dyspnea   INITIAL PRESENTATION: 43yo female with hx HTN, admitted 10/21 with AMS/?seizures, diagnosed as HSV encephalitis as well as PNA (?aspiration) and new dx HIV.  Followed by neurology and ID and rx with acyclovir, PCP rx.  On 10/28 developed worsening fevers, tachypnea/resp failure and PCCM consulted for tx ICU.  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  Otherwise neg acute.  EEG 10/22>>>normal drowsy and asleep electroencephalogram. No epileptiform activity is noted.    SIGNIFICANT EVENTS:   10/28 - worsening tachypnea, AMS, tx ICU  ETT 10/28 >>  SUBJECTIVE: vent dyschony  VITAL SIGNS: Temp:  [99 F (37.2 C)-101.3 F (38.5 C)] 101.3 F (38.5 C) (10/30 0809) Pulse Rate:  [78-115] 107 (10/30 0804) Resp:  [23-30] 29 (10/30 0804) BP: (114-188)/(68-100) 188/94 mmHg (10/30 0804) SpO2:  [97 %-100 %] 100 % (10/30 0804) FiO2 (%):  [60 %] 60 % (10/30 0804) Weight:  [47.5 kg (104 lb 11.5 oz)] 47.5 kg (104 lb 11.5 oz) (10/30 0500) HEMODYNAMICS:   VENTILATOR SETTINGS: Vent Mode:  [-] PRVC FiO2 (%):  [60 %] 60 % Set Rate:  [24 bmp] 24 bmp Vt Set:  [500 mL] 500 mL PEEP:  [10 cmH20] 10 cmH20 Plateau Pressure:  [17 cmH20-22 cmH20] 17 cmH20 INTAKE / OUTPUT:  Intake/Output Summary (Last 24 hours) at 02/05/14 0957 Last data filed at 02/05/14 0600  Gross per 24 hour  Intake 2368.7 ml  Output    640 ml  Net 1728.7 ml    PHYSICAL EXAMINATION: General:  Frail, cachectic, chronically ill appearing female Neuro:  rass -4, responds to pain by opening eyes, not moving ext HEENT: jvd wnl Cardiovascular:  s1s2 rrr, tachy Lungs: ronchi  diffuse Abdomen:  Soft, +bs Musculoskeletal:  Warm and dry, no edema, foot drops boots   LABS:  CBC  Recent Labs Lab 02/03/14 1210 02/04/14 0245 02/05/14 0226  WBC 12.8* 9.7 6.2  HGB 9.0* 9.3* 7.6*  HCT 27.0* 27.7* 23.0*  PLT 157 158 183   Coag's No results found for this basename: APTT, INR,  in the last 168 hours BMET  Recent Labs Lab 02/04/14 0245 02/04/14 1500 02/05/14 0226  NA 135* 136* 133*  K 4.1 3.9 3.9  CL 102 103 102  CO2 16* 15* 14*  BUN 36* 40* 43*  CREATININE 2.27* 2.65* 2.83*  GLUCOSE 86 123* 149*   Electrolytes  Recent Labs Lab 02/04/14 0245 02/04/14 1500 02/05/14 0226  CALCIUM 8.6 8.3* 8.1*  MG  --   --  1.7  PHOS  --   --  2.8   Sepsis Markers  Recent Labs Lab 01/31/14 0250 02/02/14 0018  PROCALCITON 1.26 0.76   ABG  Recent Labs Lab 02/04/14 02/04/14 0207  PHART 7.159* 7.323*  PCO2ART 51.1* 31.3*  PO2ART 140.0* 57.9*   Liver Enzymes  Recent Labs Lab 01/30/14 0850 02/05/14 0226  AST 11 12  ALT 5 <5  ALKPHOS 52 87  BILITOT 0.7 0.3  ALBUMIN 2.1* 1.4*   Cardiac Enzymes No results found for this basename: TROPONINI, PROBNP,  in the last 168 hours Glucose  Recent Labs Lab 02/03/14 1757 02/04/14 0023 02/04/14 0805  02/04/14 1511 02/05/14 0030 02/05/14 0807  GLUCAP 102* 116* 91 140* 144* 119*    Imaging Dg Chest Portable 1 View  02/04/2014   CLINICAL DATA:  Pneumonia  EXAM: PORTABLE CHEST - 1 VIEW  COMPARISON:  02/03/2014  FINDINGS: Cardiac shadow is stable. A left jugular central line, feeding catheter and endotracheal tube are again seen and stable in position. The endotracheal tube lies 3.8 cm above the carina. There is been improved aeration in the right lung base. Persistent left basilar changes are noted. Continued followup is recommended.  IMPRESSION: Improved aeration in the right lung base. Stable changes in the left lung base are seen.  Tubes and lines as described.   Electronically Signed   By: Alcide CleverMark   Lukens M.D.   On: 02/04/2014 07:37   Portable Chest Xray  02/04/2014   CLINICAL DATA:  Line placement.  Respiratory failure.  EXAM: PORTABLE CHEST - 1 VIEW  COMPARISON:  02/03/2014 at 7:34 a.m.  FINDINGS: New left IJ catheter, tip at the upper cavoatrial junction. No evidence of pneumothorax.  New endotracheal tube, tip 1 cm above the carina. Feeding tube remains at least in the stomach.  Greater lung volumes after intubation. Bilateral airspace disease persists, focal in the right infrahilar lung can suspicious for pneumonia. No evidence of effusion or pneumothorax.  IMPRESSION: 1. New endotracheal tube, tip 1 cm above the carina. 2. New left IJ catheter is in good position.  No pneumothorax. 3. Improved lung volumes with persistent bilateral airspace disease (likely pneumonia).   Electronically Signed   By: Tiburcio PeaJonathan  Watts M.D.   On: 02/04/2014 01:07   CXR - ett wnl, int prom/ infiltrates  ASSESSMENT / PLAN:  PULMONARY Acute hypoxic resp failure Severe tachypnea - somewhat improved after treatment of high fever Mild pulmonary infiltrates - PCP P:   Bronchoscopy -send BAL for PCP/resp viral panel  Ensure plat less 30, may need TV reduction Goal to 50% then peep reduction abg in am to goal reduce rate guarenteed pcxr in am  Unable SBT Steroids for pcp addition  NEUROLOGIC Acute enceph HSV encephalitis  ?seizures P:   RASS goal: 0 precedex dc as not controlling resp drive / dyschrony Add fent drip then re evaluate vent dcyrony, may need addition propofol Neurology following  Cont keppra  Acyclovir as below  CSF CMV, EBV and JCV pending    CARDIOVASCULAR HTN  tachycardia exacerbated by fever SIRS - risk of septic shock Elevated troponins - suspect demand ischemia  Diastolic dysfunction - EF 55-60% P:  rx fever  PRN lopressor to keep HR <130 PRN hydralazine to keep SBP <170 D/c clonidine  Asa Re assess bp after fent as has reduced with pain control   RENAL AKI / ARF -  bactrim ?? Metabolic acidosis  P:   Monitor BMET q 12h Monitor I/Os Rena US, cvp, ua, urine osm, na - establish volume status, likely needs lasix Will d/w ID to change off bactrim to alt treatment  GASTROINTESTINAL Protein calorie malnutrition  Diarrhea - CDiff neg, Giardia and cryptosporidium negative P:  Nutrition following  -TFs ppi lft follow up with acyclovir  HEMATOLOGIC DVT prevention, aneia,  P:  Sq heparin cbc in am   INFECTIOUS HIV/AIDS - new dx  ? PNA - bacterial vs pneumocystis  HSV encephalitis  Persistent fever (All abx per ID) PCP PNA P:   BCx2 10/23>>>ng BCx2 10/27>>> CSF culture 10/23>>> neg  UC 10/21>>>neg  BAL PCP 10/29>>> PCP pos BAL resp viral panel  10/29 >> Acyclovir 10/26>>> Zosyn 10/25>>> Bactrim 10/27>>> Azithro (weekly, MAC prophylaxis) 10/27>>> Diflucan 10/27>>>  Antimicrobial therapies per ID Follow finals bronch Add steroids for pcp IV  ENDOCRINE No active issue  P:   High risk hypo   FAMILY  - Updates:daughter 10/28 prior to extubation  Need to clarify HCPOA here -spouse for now  The patient is critically ill with multiple organ systems failure and requires high complexity decision making for assessment and support, frequent evaluation and titration of therapies, application of advanced monitoring technologies and extensive interpretation of multiple databases. Critical Care Time devoted to patient care services described in this note is 30 minutes.   Nelda BucksFEINSTEIN,Othelia Riederer J. MD   02/05/2014  9:57 AM  Mcarthur Rossettianiel J. Tyson AliasFeinstein, MD, FACP Pgr: [phone] [place] [facility]

## 2014-02-06 ENCOUNTER — Inpatient Hospital Stay (HOSPITAL_COMMUNITY): Payer: Medicaid Other

## 2014-02-06 DIAGNOSIS — R4020 Unspecified coma: Secondary | ICD-10-CM

## 2014-02-06 LAB — BLOOD GAS, ARTERIAL
ACID-BASE DEFICIT: 10.4 mmol/L — AB (ref 0.0–2.0)
BICARBONATE: 14.7 meq/L — AB (ref 20.0–24.0)
Drawn by: 347621
FIO2: 0.5 %
O2 Saturation: 99.6 %
PCO2 ART: 29.6 mmHg — AB (ref 35.0–45.0)
PEEP: 10 cmH2O
Patient temperature: 97.5
RATE: 24 resp/min
TCO2: 15.6 mmol/L (ref 0–100)
VT: 500 mL
pH, Arterial: 7.313 — ABNORMAL LOW (ref 7.350–7.450)
pO2, Arterial: 241 mmHg — ABNORMAL HIGH (ref 80.0–100.0)

## 2014-02-06 LAB — CBC
HCT: 27 % — ABNORMAL LOW (ref 36.0–46.0)
HEMATOCRIT: 21 % — AB (ref 36.0–46.0)
HEMOGLOBIN: 6.8 g/dL — AB (ref 12.0–15.0)
HEMOGLOBIN: 9 g/dL — AB (ref 12.0–15.0)
MCH: 25.1 pg — AB (ref 26.0–34.0)
MCH: 26.2 pg (ref 26.0–34.0)
MCHC: 32.4 g/dL (ref 30.0–36.0)
MCHC: 33.3 g/dL (ref 30.0–36.0)
MCV: 77.5 fL — ABNORMAL LOW (ref 78.0–100.0)
MCV: 78.7 fL (ref 78.0–100.0)
PLATELETS: 276 10*3/uL (ref 150–400)
Platelets: 241 10*3/uL (ref 150–400)
RBC: 2.71 MIL/uL — AB (ref 3.87–5.11)
RBC: 3.43 MIL/uL — ABNORMAL LOW (ref 3.87–5.11)
RDW: 17.8 % — ABNORMAL HIGH (ref 11.5–15.5)
RDW: 17.4 % — ABNORMAL HIGH (ref 11.5–15.5)
WBC: 4.2 10*3/uL (ref 4.0–10.5)
WBC: 8 10*3/uL (ref 4.0–10.5)

## 2014-02-06 LAB — CULTURE, BAL-QUANTITATIVE W GRAM STAIN: Colony Count: NO GROWTH

## 2014-02-06 LAB — COMPREHENSIVE METABOLIC PANEL
ALBUMIN: 1.3 g/dL — AB (ref 3.5–5.2)
ALK PHOS: 77 U/L (ref 39–117)
Anion gap: 16 — ABNORMAL HIGH (ref 5–15)
BUN: 49 mg/dL — ABNORMAL HIGH (ref 6–23)
CALCIUM: 7.7 mg/dL — AB (ref 8.4–10.5)
CO2: 15 mEq/L — ABNORMAL LOW (ref 19–32)
Chloride: 104 mEq/L (ref 96–112)
Creatinine, Ser: 2.74 mg/dL — ABNORMAL HIGH (ref 0.50–1.10)
GFR calc non Af Amer: 20 mL/min — ABNORMAL LOW (ref >90)
GFR, EST AFRICAN AMERICAN: 23 mL/min — AB (ref >90)
GLUCOSE: 196 mg/dL — AB (ref 70–99)
POTASSIUM: 4.2 meq/L (ref 3.7–5.3)
SODIUM: 135 meq/L — AB (ref 137–147)
Total Bilirubin: <0.2 mg/dL — ABNORMAL LOW (ref 0.3–1.2)
Total Protein: 5.6 g/dL — ABNORMAL LOW (ref 6.0–8.3)

## 2014-02-06 LAB — GLUCOSE, CAPILLARY
GLUCOSE-CAPILLARY: 226 mg/dL — AB (ref 70–99)
Glucose-Capillary: 183 mg/dL — ABNORMAL HIGH (ref 70–99)
Glucose-Capillary: 172 mg/dL — ABNORMAL HIGH (ref 70–99)
Glucose-Capillary: 174 mg/dL — ABNORMAL HIGH (ref 70–99)
Glucose-Capillary: 218 mg/dL — ABNORMAL HIGH (ref 70–99)
Glucose-Capillary: 279 mg/dL — ABNORMAL HIGH (ref 70–99)

## 2014-02-06 LAB — PREPARE RBC (CROSSMATCH)

## 2014-02-06 LAB — MAGNESIUM: MAGNESIUM: 2.3 mg/dL (ref 1.5–2.5)

## 2014-02-06 LAB — PHOSPHORUS: Phosphorus: 4.6 mg/dL (ref 2.3–4.6)

## 2014-02-06 LAB — CULTURE, BAL-QUANTITATIVE: CULTURE: NO GROWTH

## 2014-02-06 LAB — CULTURE, RESPIRATORY W GRAM STAIN

## 2014-02-06 LAB — CULTURE, RESPIRATORY: CULTURE: NO GROWTH

## 2014-02-06 MED ORDER — DEXTROSE 5 % IV SOLN
10.0000 mg/kg | INTRAVENOUS | Status: DC
  Administered 2014-02-06 – 2014-02-19 (×14): 470 mg via INTRAVENOUS
  Filled 2014-02-06 (×15): qty 9.4

## 2014-02-06 MED ORDER — SODIUM CHLORIDE 0.9 % IV SOLN: Freq: Once | INTRAVENOUS | Status: DC

## 2014-02-06 NOTE — Progress Notes (Signed)
[facility]    Date of Admission:  01/27/2014   Total days of antibiotics 9        Day 3 Diflucan              Day 9 Acyclovir        DAy 4 IV bactrim                                                                                     Zosyn D/cd after 7 days        - weekly Azithromycin    ID: Desiree Hale is a 43 y.o. female with presented with AMS, seizure like activity, unintentional weightloss.  Being managed for acute encephalopathy, found to have advanced HIV disease with CD4- 10, and Viral load- 251 358. 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 Decompensated and transferred to ICU- 10/28, intubated 10/28. Sputum 10/28- positive for PCP. Bronchoscopy done- 10/29.  Principal Problem:   Acute respiratory failure Active Problems:   Altered mental state   HTN (hypertension)   Tachycardia   Metabolic acidosis   Hyperglycemia   Acute renal failure   Microcytic anemia   Hypercalcemia   Aspiration pneumonia   Seizures  Subjective: Afebrile, since initiation of steroids. Remains intubated, FiO2 now down to 40% from 60%  Medications:  . sodium chloride   Intravenous Once  . acyclovir  10 mg/kg Intravenous Q24H  . antiseptic oral rinse  7 mL Mouth Rinse 6 times per day  . aspirin  81 mg Oral Daily  . azithromycin  600 mg Per Tube Daily  . chlorhexidine  15 mL Mouth Rinse BID  . clindamycin (CLEOCIN) IV  600 mg Intravenous 3 times per day  . ethambutol  800 mg Per Tube Q24H  . fluconazole  100 mg Per Tube Daily  . heparin subcutaneous  5,000 Units Subcutaneous 3 times per day  . labetalol  100 mg Oral BID  . levETIRAcetam  500 mg Intravenous Q12H  . methylPREDNISolone (SOLU-MEDROL) injection  40 mg Intravenous Q12H  . primaquine  30 mg Per Tube Q24H  . rifabutin  150 mg Oral Daily  . sodium chloride  3 mL Intravenous Q12H    Objective: Vital signs in last 24 hours: Temp:  [97.1 F (36.2 C)-99.6 F (37.6 C)] 97.8 F (36.6  C) (10/31 1100) Pulse Rate:  [66-106] 87 (10/31 1228) Resp:  [16-26] 22 (10/31 1228) BP: (84-178)/(50-104) 117/67 mmHg (10/31 1100) SpO2:  [99 %-100 %] 99 % (10/31 1228) FiO2 (%):  [40 %-60 %] 40 % (10/31 1228) Weight:  [103 lb 9.9 oz (47 kg)] 103 lb 9.9 oz (47 kg) (10/31 0445)  Physical Exam-  General appearance: Sedated, On vent, briefly opened eyes to verbal stimuli, appears older than stated age and lethargic, malnourished.  Head: Normocephalic, intubated. Pulm= Coarse breath sounds. GI: soft, decrease bs,  no guarding, no organomegaly, rectal tube still draining dark liquid stool- Extremities: extremities normal, atraumatic, no cyanosis or edema, boot on feet. Pulses: 2+ and symmetric Skin: warm.  Lab Results  Recent Labs  02/05/14 0226 02/06/14 0500  WBC 6.2 4.2  HGB 7.6* 6.8*  HCT 23.0* 21.0*  NA 133* 135*  K 3.9 4.2  CL 102 104  CO2 14* 15*  BUN 43* 49*  CREATININE 2.83* 2.74*   Microbiology: 10/21- Urine culture- NG- Final 10/23- Blood culture X2 - NG FINAL   10/23 - stool studies negative for cdiff, giardia, cryptosporidium 10/23- MRSA positive. 10/24- AFB culture blood- NGTD >> 10/24- CSF gram stain- No org seen, WBC present. 10/24- CSF culture- NGTD >> 10/24 CSF CMV VL <200 10/26- Fungal culture NG >> 10/27- Blood cultures X2 >> 10/28- Sputum- Positive for PCP. 10/29- Tracheal aspirate culture- >> 10/29- BAL ->> 10/29- Sputum AFB >>  Studies/Results: Dg Abd Portable 1v  01/31/2014   CLINICAL DATA:  NG tube placement.  EXAM: PORTABLE ABDOMEN - 1 VIEW  COMPARISON:  None.  FINDINGS: Bowel gas pattern is nonobstructive. A feeding tube follows the greater curvature of the stomach and terminates in the expected location of the antrum/pylorus.  IMPRESSION: Feeding tube terminates in the expected location of the antrum/pylorus.   Electronically Signed   By: Curlene Dolphin M.D.   On: 01/31/2014 18:58   Dg Fluoro Guide Lumbar Puncture  01/30/2014   CLINICAL  DATA:  43 year old female with fever and altered mental status. Suspicious for encephalitis.  IMPRESSION: 1. Successful uncomplicated fluoroscopic guided lumbar puncture at L3-L4 with opening pressure of 17 cm of water, yielding consistently light yellow tinged CSF.   Electronically Signed   By: Vinnie Langton M.D.   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 Placed on broad spectrum for aspiration pneumonia, now with dx PCP pneumonia. On OI proph with azithromycin. Transferred to ICU and intubated- 10/28.   HSV 2 encephalitis- MRI- 10/22- with diffuse signal abnormality- temporal and posterior frontal lobes.  CSF HSV 2 + by PCR. CSF Crypto, Gram stain and cultures so far negative.   -  Continue Acyclovir, renally dosed, currently Day #9  Aspiration PNA -  Finished course of therapy with pip/tazo  PCP Pneumonia - initially started on IV Bactrim for presumed PCP then developed worsening AKI, as well as respiratory distress. She was initiated on steroids now day #3, and switched to primaquine and clindamycin starting on 02/06/14.  CKD =  Cr applies not worsening. It likely due to various nephrotoxicity SE of many drugs such as acyclovir and TMP. Consider renal consult if continues to worsen.  HIV- AIDS- Viral load- 251 358. CD4- 10,  HIV genotype pending. [id number].  - will need to construct regimen that can be delivered by NG tube since not all medicines can be crushed.  AFB on BAL = recommend to repeat further specimens. Will ask lab to add Mtb PCR. Likely to be NTM. Given she has fever for 8 days and 3 days despite PCP treatment we will also empirically treat for MAC with clarithromycin, ethambutol and rifampin  Oral thrush- currently on Fluconazole, esophagitis dosing  AIDs with Diarrhea- Giardia and cryptosporidium negative, C. Diff, GI pathogen panel negative. Differentials- Disseminated MAC, CMV colitis, HIV  related enteropathy - continue to support for volume loss, - diarrhea appears to be contributing to acidosis  Fevers : fungal and afb blood cultures collected. Not likely to be [place] (hemophagocytic lymphohistiocytosis).since criteria is not met.  Lipid profile- TG- 120, ferritin- 1050 - due to ongoing fevers, we will start empiric treatment for MAC.  - repeat blood culture and fungal cultures, AFB pending.  HIV Disclosure: After speaking with social worker, can disclose to her husband who she is legally married to. He states that since he works 3rd shift, he doesn't want her to be by herself, thus she stays with their daughter.   I have told her husband that she has been diagnosed with HIV during this hospitalization. He states that he had not known this. She had been hospitalized for shingles  At [facility] this year and had heart attack hospitalized at [facility] in spring 2014.  Severe protein-caloric malnutrition = continue with nutritional support through ng  Low delta-gap/ metabolic acidosis = check lactic acidosis.  Elzie [facility] [phone]

## 2014-02-06 NOTE — Progress Notes (Signed)
eLink Physician-Brief Progress Note Patient Name: Desiree BeamsKristy Hale DOB: Jun 04, 1970 MRN: [id number]   Date of Service  02/06/2014  HPI/Events of Note  Hgb down to 6.8 from 7.6  eICU Interventions  Plan: Transfuse 1 unit pRBC Post-transfusion CBC        Goerge Mohr 02/06/2014, 6:10 AM

## 2014-02-06 NOTE — Progress Notes (Signed)
02/06/2014 0605  Critical lab result of 6.8 for Hgb.   RN to continue to monitor

## 2014-02-06 NOTE — Progress Notes (Signed)
PULMONARY / CRITICAL CARE MEDICINE   Name: Desiree BeamsKristy Hale MRN: [id number] DOB: October 17, 1970    ADMISSION DATE:  01/27/2014 CONSULTATION DATE:  02/03/14  REFERRING MD :  [facility]   CHIEF COMPLAINT:  AMS, dyspnea   INITIAL PRESENTATION: 43 y/o female with hx HTN, admitted 10/21 with AMS/?seizures, diagnosed as HSV encephalitis as well as PNA (?aspiration) and new dx HIV.  Followed by neurology and ID and rx with acyclovir, PCP rx.  On 10/28 developed worsening fevers, tachypnea/resp failure and PCCM consulted for tx ICU.  Further w/u showed HIV pos with CD4 =10 (new diagnosis).    STUDIES:  10/21  CT head >>> neg  10/22  MR Brain >>> Diffuse cortical and subcortical signal abnormality - likely post ictyl phenomenon.  Otherwise neg acute.  10/22  EEG  >>> normal drowsy and asleep electroencephalogram. No epileptiform activity is noted.  10/22  ECHO >>> EF 55-60%, grade 1 diastolic dysfunction, no RWMA, small free-flowing pericardial effusion 10/30  Renal US >> kidneys diffusely echogenic, concerning for medical renal disease   SIGNIFICANT EVENTS:   10/28  Worsening tachypnea, AMS, tx ICU.  Required intubation  10/31  Remains on full support, fent gtt, received 1 unit PRBC's   SUBJECTIVE:  RN reports received 1 unit PRBC's, VSS, CVP 7   VITAL SIGNS: Temp:  [97.1 F (36.2 C)-99.6 F (37.6 C)] 97.8 F (36.6 C) (10/31 1100) Pulse Rate:  [66-106] 87 (10/31 1228) Resp:  [16-28] 22 (10/31 1228) BP: (84-178)/(50-104) 117/67 mmHg (10/31 1100) SpO2:  [99 %-100 %] 99 % (10/31 1228) FiO2 (%):  [40 %-60 %] 40 % (10/31 1228) Weight:  [103 lb 9.9 oz (47 kg)] 103 lb 9.9 oz (47 kg) (10/31 0445)  HEMODYNAMICS: CVP:  [6 mmHg-7 mmHg] 7 mmHg  VENTILATOR SETTINGS: Vent Mode:  [-] PRVC FiO2 (%):  [40 %-60 %] 40 % Set Rate:  [24 bmp] 24 bmp Vt Set:  [500 mL] 500 mL PEEP:  [8 cmH20-10 cmH20] 8 cmH20 Plateau Pressure:  [14 cmH20-22 cmH20] 20 cmH20  INTAKE / OUTPUT:  Intake/Output Summary (Last 24  hours) at 02/06/14 1253 Last data filed at 02/06/14 1045  Gross per 24 hour  Intake 2471.5 ml  Output    915 ml  Net 1556.5 ml    PHYSICAL EXAMINATION: General:  Frail, cachectic, chronically ill appearing female Neuro:  rass -2, responds to verbal by opening eyes, not moving ext HEENT: OETT, no jvd Cardiovascular:  s1s2 rrr, tachy Lungs: resp's even/non-labored, lungs bilaterally with scattered rhonchi Abdomen:  Soft, +bs Musculoskeletal:  Warm and dry, no edema, foot drops boots in place  LABS:  CBC  Recent Labs Lab 02/04/14 0245 02/05/14 0226 02/06/14 0500  WBC 9.7 6.2 4.2  HGB 9.3* 7.6* 6.8*  HCT 27.7* 23.0* 21.0*  PLT 158 183 241   BMET  Recent Labs Lab 02/04/14 1500 02/05/14 0226 02/06/14 0500  NA 136* 133* 135*  K 3.9 3.9 4.2  CL 103 102 104  CO2 15* 14* 15*  BUN 40* 43* 49*  CREATININE 2.65* 2.83* 2.74*  GLUCOSE 123* 149* 196*   Electrolytes  Recent Labs Lab 02/04/14 1500 02/05/14 0226 02/06/14 0500  CALCIUM 8.3* 8.1* 7.7*  MG  --  1.7 2.3  PHOS  --  2.8 4.6   Sepsis Markers  Recent Labs Lab 01/31/14 0250 02/02/14 0018  PROCALCITON 1.26 0.76   ABG  Recent Labs Lab 02/04/14 02/04/14 0207 02/06/14 0350  PHART 7.159* 7.323* 7.313*  PCO2ART 51.1* 31.3* 29.6*  PO2ART 140.0* 57.9* 241.0*   Liver Enzymes  Recent Labs Lab 02/05/14 0226 02/06/14 0500  AST 12 <5  ALT <5 <5  ALKPHOS 87 77  BILITOT 0.3 <0.2*  ALBUMIN 1.4* 1.3*   Glucose  Recent Labs Lab 02/05/14 1529 02/05/14 1948 02/06/14 0019 02/06/14 0425 02/06/14 0721 02/06/14 1112  GLUCAP 195* 249* 183* 172* 174* 218*    Imaging [place] Renal  02/05/2014   CLINICAL DATA:  Acute renal insufficiency  EXAM: RENAL/URINARY TRACT ULTRASOUND COMPLETE  COMPARISON:  None.  FINDINGS: Right Kidney:  Length: 10.0 cm. Echogenicity is diffusely increased. The renal cortical thickness is normal. There is no appreciable mass or perinephric fluid. There is slight fullness of the renal  collecting system without focal obstruction site seen. There is no sonographically demonstrable calculus or ureterectasis.  Left Kidney:  Length: 10.4 cm. Echogenicity is diffusely increased. Renal cortical thickness is normal. No mass, pelvicaliectasis, or perinephric fluid collection. There is no sonographically demonstrable calculus or ureterectasis. Visualized.  Bladder:  Appears normal for degree of bladder distention. A Foley catheter is present within the urinary bladder. There remains some urine in the urinary bladder currently, however.  IMPRESSION: Both kidneys are diffusely echogenic, a finding that may be seen with medical renal disease. The renal cortical thickness is normal bilaterally. There is slight fullness of the right renal collecting system of uncertain etiology. No pelvicaliectasis is seen on the left. No renal masses are identified on either side.   Electronically Signed   By: Bretta BangWilliam  Woodruff M.D.   On: 02/05/2014 16:42   Dg Chest Port 1 View  02/05/2014   CLINICAL DATA:  Acute respiratory failure.  EXAM: PORTABLE CHEST - 1 VIEW  COMPARISON:  02/04/2014  FINDINGS: Endotracheal tube is 4.9 cm above the carina. Feeding tube extends into the abdomen. Patient is rotated towards the left which limits evaluation of the central line placement. The left jugular central line tip is in the region of the lower SVC. Concern for subtle airspace densities in the right lower lung. Heart size is normal. Improved aeration at the left lung base. There may be some residual airspace densities in the left mid lung.  IMPRESSION: Residual airspace densities at the right lung base and left mid lung. Improved aeration at the left lung base.  Support apparatuses as described.   Electronically Signed   By: Richarda OverlieAdam  Henn M.D.   On: 02/05/2014 07:43    ASSESSMENT / PLAN:  PULMONARY Acute Hypoxic Resp Failure Severe Tachypnea - somewhat improved after treatment of high fever Mild pulmonary infiltrates - PCP S/P  Bronchoscopy - see ID P:   Full support, PRVC 500/24/8/40% PRN ABG Wean PEEP/FiO2 for sats > 92% Trend CXR SBT when able  Steroids for PCP  NEUROLOGIC CMV >> 10 EBV >> less than 200  CSF JCV >> not detected  A: Acute encephalopathy - in setting of HSV encephalitis HSV encephalitis  ? Seizures P:   RASS goal: 0 Fentanyl drip, monitor for asynchrony  Neurology following  Cont keppra  Acyclovir as below  CSF studies as above    CARDIOVASCULAR HTN  Tachycardia - exacerbated by fever SIRS  Elevated troponins - suspect demand ischemia  Diastolic dysfunction - EF 55-60% Small Pericardial Effusion - free flowing, noted on ECHO 10/22 P:  Rx fever  PRN lopressor to keep HR <130 PRN hydralazine to keep SBP <170 ASA  RENAL AKI / ARF - bactrim stopped 10/30, renal US negative 10/30 Compensated AG Metabolic Acidosis - uremia ??  P:   Trend BMP Monitor I/Os CVP 7, renal US neg, UA with granular casts c/w medical renal dz. Urine osmol on lower limit normal  Assess lactate   GASTROINTESTINAL Protein calorie malnutrition  Diarrhea - CDiff neg, Giardia and cryptosporidium negative P:  Nutrition following  -TFs PPI Trend LFT's with acyclovir, WNL 10/31   HEMATOLOGIC DVT prevention Anemia - tx 10/31 x 1 P:  SQ heparin Trend CBC  CBC post transfusion  FOB stool    INFECTIOUS HIV/AIDS - new dx HSV encephalitis  Persistent Fever PCP PNA P:   BCx2 10/23>>>neg BCx2 10/27>>> CSF culture 10/23>>> neg  UC 10/21>>>neg  BAL 10/29>> neg Sputum 10/29 >> neg Sputum AFB 10/29 >> prelim neg >> Sputum Fungal 10/29 >> prelim neg >> Sputum PCP DFA 10/28 >> PCP pos BAL resp viral panel 10/29 >> Sputum AFB 10/31 >>   Acyclovir 10/26>>> Zosyn 10/25>>>10/29 Bactrim 10/27>>>10/30 Azithro (weekly, MAC prophylaxis) 10/27>>> Diflucan 10/27>>> Clinda 10/30 >>> Ethambutol 10/30 >>> Rifabutin 10/30 >>>  Antimicrobial therapies per ID Follow finals bronch Steroids for pcp  IV Repeat sputum for AFB  ENDOCRINE No active issue  P:   High risk hypoglycemia  D5NS at 39ml/hr  FAMILY:  No family available 10/31.   Need to clarify HCPOA here -spouse for now.   They were previously separated until 2010, currently together.     Canary Brim, NP-C [facility] Pgr: [phone] or [phone] 02/06/2014  12:53 PM  Attending Note:  I have examined the patient, reviewed the notes, labs, studies. I have discussed the patient with B Ollis.  I agree with the data, assessment and plans as amended by me in the note above.  The patient is critically ill with multiple organ systems failure and requires high complexity decision making for assessment and support, frequent evaluation and titration of therapies, application of advanced monitoring technologies and extensive interpretation of multiple databases. My critical care time is 35 minutes independent of procedures or care given by other providers.  Levy Pupa, MD, PhD 02/06/2014, 6:20 PM [facility] [phone] or if no answer [phone]

## 2014-02-06 NOTE — Clinical Social Work Note (Signed)
CSW contacted by MD regarding assistance with confirming disclosure of medical information can be made to patient's husband. Per MD, as patient currently resides with daughter and not with husband, it needs to be determined if patient and husband are (legally) separated or married. CSW reviewed chart and noted CSW previously spoke with patient's husband who reports patient resides with daughter because he works 3rd shift. CSW contacted patient's daughter Fraser Din(Khadia). CSW introduced self and explained role. CSW asked daughter about living arrangements with patient. Per daughter, her father Danne Baxter(Anthony Faye) works 3rd shift and cannot provide care for patient during that time. Daughter states father is at the home every day during the day with patient as they continue to be married. Fraser DinKhadia denied any separation between patient and husband at this time and confirmed patient's husband Danne Baxter(Anthony Lineberry) as primary decision maker for patient. MD made aware of the above. CSW to be re-consulted as needs arise.  Jachelle Fluty Patrick-[place], LCSWA Weekend Clinical Social Worker [phone]

## 2014-02-07 ENCOUNTER — Inpatient Hospital Stay (HOSPITAL_COMMUNITY): Payer: Medicaid Other

## 2014-02-07 ENCOUNTER — Encounter (HOSPITAL_COMMUNITY): Payer: Self-pay

## 2014-02-07 DIAGNOSIS — B004 Herpesviral encephalitis: Secondary | ICD-10-CM

## 2014-02-07 DIAGNOSIS — B20 Human immunodeficiency virus [HIV] disease: Secondary | ICD-10-CM | POA: Insufficient documentation

## 2014-02-07 DIAGNOSIS — D72829 Elevated white blood cell count, unspecified: Secondary | ICD-10-CM

## 2014-02-07 DIAGNOSIS — B59 Pneumocystosis: Secondary | ICD-10-CM | POA: Insufficient documentation

## 2014-02-07 DIAGNOSIS — E43 Unspecified severe protein-calorie malnutrition: Secondary | ICD-10-CM | POA: Insufficient documentation

## 2014-02-07 DIAGNOSIS — J9600 Acute respiratory failure, unspecified whether with hypoxia or hypercapnia: Secondary | ICD-10-CM

## 2014-02-07 DIAGNOSIS — B370 Candidal stomatitis: Secondary | ICD-10-CM | POA: Insufficient documentation

## 2014-02-07 DIAGNOSIS — N179 Acute kidney failure, unspecified: Secondary | ICD-10-CM | POA: Insufficient documentation

## 2014-02-07 DIAGNOSIS — R197 Diarrhea, unspecified: Secondary | ICD-10-CM | POA: Insufficient documentation

## 2014-02-07 DIAGNOSIS — R509 Fever, unspecified: Secondary | ICD-10-CM | POA: Insufficient documentation

## 2014-02-07 LAB — BASIC METABOLIC PANEL
Anion gap: 15 (ref 5–15)
BUN: 62 mg/dL — AB (ref 6–23)
CHLORIDE: 106 meq/L (ref 96–112)
CO2: 16 meq/L — AB (ref 19–32)
Calcium: 8.1 mg/dL — ABNORMAL LOW (ref 8.4–10.5)
Creatinine, Ser: 3.26 mg/dL — ABNORMAL HIGH (ref 0.50–1.10)
GFR calc Af Amer: 19 mL/min — ABNORMAL LOW (ref >90)
GFR calc non Af Amer: 16 mL/min — ABNORMAL LOW (ref >90)
Glucose, Bld: 194 mg/dL — ABNORMAL HIGH (ref 70–99)
POTASSIUM: 4.8 meq/L (ref 3.7–5.3)
Sodium: 137 mEq/L (ref 137–147)

## 2014-02-07 LAB — TYPE AND SCREEN
ABO/RH(D): A POS
Antibody Screen: NEGATIVE
Unit division: 0

## 2014-02-07 LAB — DIFFERENTIAL
Basophils Absolute: 0 10*3/uL (ref 0.0–0.1)
Basophils Relative: 0 % (ref 0–1)
Eosinophils Absolute: 0 10*3/uL (ref 0.0–0.7)
Eosinophils Relative: 0 % (ref 0–5)
LYMPHS ABS: 0.2 10*3/uL — AB (ref 0.7–4.0)
Lymphocytes Relative: 2 % — ABNORMAL LOW (ref 12–46)
MONOS PCT: 3 % (ref 3–12)
Monocytes Absolute: 0.3 10*3/uL (ref 0.1–1.0)
Neutro Abs: 10.9 10*3/uL — ABNORMAL HIGH (ref 1.7–7.7)
Neutrophils Relative %: 95 % — ABNORMAL HIGH (ref 43–77)

## 2014-02-07 LAB — MISCELLANEOUS TEST

## 2014-02-07 LAB — GLUCOSE, CAPILLARY
GLUCOSE-CAPILLARY: 197 mg/dL — AB (ref 70–99)
Glucose-Capillary: 217 mg/dL — ABNORMAL HIGH (ref 70–99)
Glucose-Capillary: 205 mg/dL — ABNORMAL HIGH (ref 70–99)
Glucose-Capillary: 160 mg/dL — ABNORMAL HIGH (ref 70–99)
Glucose-Capillary: 182 mg/dL — ABNORMAL HIGH (ref 70–99)
Glucose-Capillary: 175 mg/dL — ABNORMAL HIGH (ref 70–99)

## 2014-02-07 LAB — CBC
HCT: 25 % — ABNORMAL LOW (ref 36.0–46.0)
HEMOGLOBIN: 8.3 g/dL — AB (ref 12.0–15.0)
MCH: 25.5 pg — AB (ref 26.0–34.0)
MCHC: 33.2 g/dL (ref 30.0–36.0)
MCV: 76.7 fL — ABNORMAL LOW (ref 78.0–100.0)
Platelets: 289 10*3/uL (ref 150–400)
RBC: 3.26 MIL/uL — AB (ref 3.87–5.11)
RDW: 17.8 % — ABNORMAL HIGH (ref 11.5–15.5)
WBC: 11.5 10*3/uL — ABNORMAL HIGH (ref 4.0–10.5)

## 2014-02-07 LAB — LACTIC ACID, PLASMA: LACTIC ACID, VENOUS: 1.2 mmol/L (ref 0.5–2.2)

## 2014-02-07 LAB — PHOSPHORUS: Phosphorus: 4.9 mg/dL — ABNORMAL HIGH (ref 2.3–4.6)

## 2014-02-07 MED ORDER — CHLORHEXIDINE GLUCONATE 0.12 % MT SOLN
15.0000 mL | Freq: Two times a day (BID) | OROMUCOSAL | Status: DC
  Administered 2014-02-07 – 2014-02-12 (×11): 15 mL via OROMUCOSAL
  Filled 2014-02-07 (×9): qty 15

## 2014-02-07 MED ORDER — CETYLPYRIDINIUM CHLORIDE 0.05 % MT LIQD
7.0000 mL | Freq: Four times a day (QID) | OROMUCOSAL | Status: DC
  Administered 2014-02-07 – 2014-02-12 (×22): 7 mL via OROMUCOSAL

## 2014-02-07 NOTE — Progress Notes (Signed)
PULMONARY / CRITICAL CARE MEDICINE   Name: Desiree BeamsKristy Hale MRN: [id number] DOB: [birth year]    ADMISSION DATE:  01/27/2014 CONSULTATION DATE:  02/03/14  REFERRING MD :  [facility]   CHIEF COMPLAINT:  AMS, dyspnea   INITIAL PRESENTATION: 43 y/o female with hx HTN, admitted 10/21 with AMS/?seizures, diagnosed as HSV encephalitis as well as PNA (?aspiration) and new dx HIV.  Followed by neurology and ID and rx with acyclovir, PCP rx.  On 10/28 developed worsening fevers, tachypnea/resp failure and PCCM consulted for tx ICU.  Further w/u showed HIV pos with CD4 =10 (new diagnosis).    STUDIES:  10/21  CT head >>> neg  10/22  MR Brain >>> Diffuse cortical and subcortical signal abnormality - likely post ictyl phenomenon.  Otherwise neg acute.  10/22  EEG  >>> normal drowsy and asleep electroencephalogram. No epileptiform activity is noted.  10/22  ECHO >>> EF 55-60%, grade 1 diastolic dysfunction, no RWMA, small free-flowing pericardial effusion 10/30  Renal US >> kidneys diffusely echogenic, concerning for medical renal disease   SIGNIFICANT EVENTS:   10/28  Worsening tachypnea, AMS, tx ICU.  Required intubation  10/31  Remains on full support, fent gtt, received 1 unit PRBC's  11/01  More awake, anemia improved  SUBJECTIVE:  RN reports no acute events, more alert.  Hopeful to wean on PSV.    VITAL SIGNS: Temp:  [97.1 F (36.2 C)-98.9 F (37.2 C)] 97.4 F (36.3 C) (11/01 0412) Pulse Rate:  [8-89] 85 (11/01 0912) Resp:  [22-25] 22 (11/01 0912) BP: (93-154)/(54-96) 104/62 mmHg (11/01 0600) SpO2:  [97 %-100 %] 99 % (11/01 0912) FiO2 (%):  [40 %] 40 % (11/01 0912) Weight:  [119 lb 0.8 oz (54 kg)] 119 lb 0.8 oz (54 kg) (11/01 0355)  HEMODYNAMICS: CVP:  [5 mmHg-9 mmHg] 9 mmHg  VENTILATOR SETTINGS: Vent Mode:  [-] PRVC FiO2 (%):  [40 %] 40 % Set Rate:  [24 bmp] 24 bmp Vt Set:  [500 mL] 500 mL PEEP:  [5 cmH20-8 cmH20] 5 cmH20 Pressure Support:  [5 cmH20] 5 cmH20 Plateau Pressure:  [20  cmH20-22 cmH20] 21 cmH20  INTAKE / OUTPUT:  Intake/Output Summary (Last 24 hours) at 02/07/14 1018 Last data filed at 02/07/14 0600  Gross per 24 hour  Intake   3209 ml  Output    770 ml  Net   2439 ml    PHYSICAL EXAMINATION: General:  Frail, cachectic, chronically ill appearing female Neuro:  rass -1, responds to verbal by opening eyes, not moving ext HEENT: OETT, no jvd Cardiovascular:  s1s2 rrr, tachy Lungs: resp's even/non-labored, lungs coarse bilaterally  Abdomen:  Soft, +bs Musculoskeletal:  Warm and dry, no edema, foot drops boots in place  LABS:  CBC  Recent Labs Lab 02/06/14 0500 02/06/14 1700 02/07/14 0400  WBC 4.2 8.0 11.5*  HGB 6.8* 9.0* 8.3*  HCT 21.0* 27.0* 25.0*  PLT 241 276 289   BMET  Recent Labs Lab 02/05/14 0226 02/06/14 0500 02/07/14 0400  NA 133* 135* 137  K 3.9 4.2 4.8  CL 102 104 106  CO2 14* 15* 16*  BUN 43* 49* 62*  CREATININE 2.83* 2.74* 3.26*  GLUCOSE 149* 196* 194*   Electrolytes  Recent Labs Lab 02/05/14 0226 02/06/14 0500 02/07/14 0400  CALCIUM 8.1* 7.7* 8.1*  MG 1.7 2.3  --   PHOS 2.8 4.6 4.9*   Sepsis Markers  Recent Labs Lab 02/02/14 0018 02/07/14 0358  LATICACIDVEN  --  1.2  PROCALCITON 0.76  --  ABG  Recent Labs Lab 02/04/14 02/04/14 0207 02/06/14 0350  PHART 7.159* 7.323* 7.313*  PCO2ART 51.1* 31.3* 29.6*  PO2ART 140.0* 57.9* 241.0*   Liver Enzymes  Recent Labs Lab 02/05/14 0226 02/06/14 0500  AST 12 <5  ALT <5 <5  ALKPHOS 87 77  BILITOT 0.3 <0.2*  ALBUMIN 1.4* 1.3*   Glucose  Recent Labs Lab 02/06/14 0721 02/06/14 1112 02/06/14 1527 02/06/14 1943 02/07/14 02/07/14 0411  GLUCAP 174* 218* 279* 226* 217* 205*    Imaging Dg Chest Port 1 View  02/06/2014   CLINICAL DATA:  Pneumocystis carinii pneumonia.  EXAM: PORTABLE CHEST - 1 VIEW  COMPARISON:  02/05/2014 and 02/04/2014  FINDINGS: Endotracheal tube, central catheter, and feeding tube in place, unchanged. Heart size and  vascularity are normal. Right lung is now clear. Slight hazy infiltrate at the left base has almost resolved. No effusions.  IMPRESSION: Minimal residual hazy infiltrate at the left base.   Electronically Signed   By: Geanie Cooley M.D.   On: 02/06/2014 06:07    ASSESSMENT / PLAN:  PULMONARY Acute Hypoxic Resp Failure Severe Tachypnea - somewhat improved after treatment of high fever Mild pulmonary infiltrates - PCP S/P Bronchoscopy - see ID P:   Full support, PRVC 500/24/8/40% PRN ABG Wean PEEP/FiO2 for sats > 92% Trend CXR SBT / WUA daily  Steroids for PCP  NEUROLOGIC CMV >> 10 EBV >> less than 200  CSF JCV >> not detected  A: Acute encephalopathy - in setting of HSV encephalitis HSV encephalitis  ? Seizures P:   RASS goal: 0 Fentanyl drip, monitor for asynchrony  Neurology following  Cont keppra  Acyclovir as below  CSF studies as above    CARDIOVASCULAR HTN  Tachycardia - exacerbated by fever SIRS  Elevated troponins - suspect demand ischemia  Diastolic dysfunction - EF 55-60% Small Pericardial Effusion - free flowing, noted on ECHO 10/22 P:  Rx fever  PRN lopressor to keep HR <130 PRN hydralazine to keep SBP <170 ASA  RENAL AKI / ARF - bactrim stopped 10/30, renal US negative 10/30, UA with granular casts c/w medical renal dz.  Compensated AG Metabolic Acidosis - uremia ?? P:   Trend BMP Monitor I/Os Assess lactate Consider renal consult if worsening sr cr trend, K wnl currently.  However, not likely a good candidate for HD.     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 TB PCR 10/31 >>   Acyclovir 10/26>>> Zosyn 10/25>>>10/29 Bactrim 10/27>>>10/30 Azithro (weekly, MAC prophylaxis) 10/27>>> Diflucan 10/27>>> Clinda 10/30 >>> Ethambutol 10/30 >>> Rifabutin 10/30 >>>  Antimicrobial therapies per ID Steroids IV for PCP  Repeat sputum for AFB & TB PCR 10/31  ENDOCRINE No active issue  P:   High risk hypoglycemia  D5NS at 41ml/hr  FAMILY:  No family available 10/31.   Need to clarify HCPOA here -spouse  for now.   They were previously separated until 2010, currently together.   Today's Summary:  43 y/o F with new diagnosed HIV, PCP PNA, TB positive sputum (work up for TB vs MAC in progress), course complicated by poor weaning efforts, anemia and renal failure.  Continue current care for now.  May need to have GOC meeting.     Canary BrimBrandi Ollis, NP-C [facility] Pgr: [phone] or [phone] 02/07/2014  10:18 AM  Attending Note:  I have examined the patient, reviewed the notes, labs, studies. I have discussed the patient with B Ollis. I agree with the data, assessment and plans as amended by me in the note above. The patient is critically ill with multiple organ systems failure and requires high complexity decision making for assessment and support, frequent evaluation and titration of therapies, application of advanced monitoring technologies and extensive interpretation of multiple databases. My critical care time is 35 minutes independent of procedures or care given by other providers  Levy Pupaobert Kseniya Grunden, MD, PhD 02/07/2014, 12:45 PM [facility] [phone] or if no answer [phone]

## 2014-02-07 NOTE — Progress Notes (Signed)
[facility]    Date of Admission:  01/27/2014   Total days of antibiotics 10        Day 6 Diflucan              Day 10 Acyclovir        DAy 3 IV clinda/primaquin (day 6 pcp tx)        Day 3 emb/rifabutin/azithro                                                                                     Zosyn D/cd after 7 days            ID: Desiree Hale is a 43 y.o. female with presented with AMS, seizure like activity, unintentional weightloss.  Being managed for acute encephalopathy, found to have advanced HIV disease with CD4- 10, and Viral load- 251 358. 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 Decompensated and transferred to ICU- 10/28, intubated 10/28. Sputum 10/28- positive for PCP. Bronchoscopy done- 10/29.  Principal Problem:   Acute respiratory failure Active Problems:   Altered mental state   HTN (hypertension)   Tachycardia   Metabolic acidosis   Hyperglycemia   Acute renal failure   Microcytic anemia   Hypercalcemia   Aspiration pneumonia   Seizures  Subjective: Afebrile, since initiation of steroids. Last fever early morning of 10/30/ Remains intubated, FiO2 now down to 40%. Watery diarrhea yesterday  Medications:  . sodium chloride   Intravenous Once  . acyclovir  10 mg/kg Intravenous Q24H  . antiseptic oral rinse  7 mL Mouth Rinse QID  . aspirin  81 mg Oral Daily  . azithromycin  600 mg Per Tube Daily  . chlorhexidine  15 mL Mouth Rinse BID  . clindamycin (CLEOCIN) IV  600 mg Intravenous 3 times per day  . ethambutol  800 mg Per Tube Q24H  . fluconazole  100 mg Per Tube Daily  . heparin subcutaneous  5,000 Units Subcutaneous 3 times per day  . labetalol  100 mg Oral BID  . levETIRAcetam  500 mg Intravenous Q12H  . methylPREDNISolone (SOLU-MEDROL) injection  40 mg Intravenous Q12H  . primaquine  30 mg Per Tube Q24H  . rifabutin  150 mg Oral Daily  . sodium chloride  3 mL Intravenous Q12H    Objective: Vital  signs in last 24 hours: Temp:  [97.1 F (36.2 C)-98.9 F (37.2 C)] 97.4 F (36.3 C) (11/01 0412) Pulse Rate:  [8-89] 69 (11/01 0600) Resp:  [22-25] 24 (11/01 0600) BP: (84-154)/(50-96) 104/62 mmHg (11/01 0600) SpO2:  [97 %-100 %] 100 % (11/01 0600) FiO2 (%):  [40 %] 40 % (11/01 0600) Weight:  [119 lb 0.8 oz (54 kg)] 119 lb 0.8 oz (54 kg) (11/01 0355)  Physical Exam-  General appearance: Sedated, On vent, briefly opened eyes to verbal stimuli, appears older than stated age and lethargic, malnourished.  Head: Normocephalic, intubated. Pulm= Coarse breath sounds. GI: soft, decrease bs,  no guarding, no organomegaly, rectal tube still draining dark liquid stool- Extremities: extremities normal, atraumatic, no cyanosis or edema, boot on feet. Pulses: 2+ and symmetric Skin: warm.  Lab Results  Recent Labs  02/06/14 0500 02/06/14 1700 02/07/14 0400  WBC 4.2 8.0 11.5*  HGB 6.8* 9.0* 8.3*  HCT 21.0* 27.0* 25.0*  NA 135*  --  137  K 4.2  --  4.8  CL 104  --  106  CO2 15*  --  16*  BUN 49*  --  62*  CREATININE 2.74*  --  3.26*   Microbiology: 10/21- Urine culture- NG- Final 10/23- Blood culture X2 - NG FINAL   10/23 - stool studies negative for cdiff, giardia, cryptosporidium 10/23- MRSA positive. 10/24- AFB culture blood- NGTD >> 10/24- CSF gram stain- No org seen, WBC present. 10/24- CSF culture- NGTD >> 10/24 CSF CMV VL <200 10/26- Fungal culture NG >> 10/27- Blood cultures X2 >> 10/28- Sputum- Positive for PCP. 10/29- Tracheal aspirate culture- >> 10/29- BAL ->> 10/29- Sputum AFB >>  Studies/Results:   Dg Fluoro Guide Lumbar Puncture  01/30/2014   CLINICAL DATA:  43 year old female with fever and altered mental status. Suspicious for encephalitis.  IMPRESSION: 1. Successful uncomplicated fluoroscopic guided lumbar puncture at L3-L4 with opening pressure of 17 cm of water, yielding consistently light yellow tinged CSF.   Electronically Signed   By: Vinnie Langton  M.D.   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 Having increasing leukocytosis over the last 2 days and ongoing diarrhea. Being treated for HSV2 encephalitis with acyclovir, PCP pna with clindamycin and primaquine. Thrush with fluconazole, and empirically on MAC treatment.  Leukocytosis = concern she is developing secondary infecitons. Will check for cdifficile. Check blood cultures and repeat afb culture  HSV 2 encephalitis- MRI- 10/22- with diffuse signal abnormality- temporal and posterior frontal lobes.  CSF HSV 2 + by PCR. CSF Crypto, Gram stain and cultures so far negative.   -  Continue Acyclovir, renally dosed, currently Day #10  PCP Pneumonia - initially started on IV Bactrim for presumed PCP then developed worsening AKI, as well as respiratory distress. She was initiated on steroids now day #4, and switched to primaquine and clindamycin starting on 02/06/14.  CKD =  Cr worsening. It likely due to various nephrotoxicity SE of many drugs such as acyclovir and TMP. Consider renal consult if continues to worsen.  HIV- AIDS- Viral load- 251 358. CD4- 10,  HIV genotype pending. [id number].  - will need to construct regimen that can be delivered by NG tube  AFB on BAL = recommend to repeat further specimens. Will ask lab to add Mtb PCR. Likely to be NTM. Given she has fever for 8 days and 3 days despite PCP treatment currently on empirically treat for MAC with clarithromycin, ethambutol and rifampin - will check quantiferon  Oral thrush- currently on Fluconazole, will decrease to thrush dosing  AIDs with Diarrhea- Giardia and cryptosporidium negative, C. Diff, GI pathogen panel negative. Differentials- Disseminated MAC, CMV colitis, HIV related enteropathy - continue to support for volume loss, - diarrhea appears  to be contributing to acidosis - will check cdifficile again  Fevers : fungal and afb blood cultures collected. Not likely to be [facility] (hemophagocytic lymphohistiocytosis).since criteria is not met.  Lipid profile- TG- 120, ferritin- 1050 - due to ongoing fevers, we will start empiric treatment for MAC.  - repeat blood culture and fungal cultures, AFB pending.  HIV Disclosure: After speaking with Education officer, museum, can  disclose to her husband who she is legally married to. He states that since he works 3rd shift, he doesn't want her to be by herself, thus she stays with their daughter.   On 10/31, I have told her husband that she has been diagnosed with HIV during this hospitalization. He states that he had not known this. She had been hospitalized for shingles  At [facility] this year and had heart attack hospitalized at [facility] in spring 2014.  Severe protein-caloric malnutrition = continue with nutritional support through ng Low delta-gap/ metabolic acidosis = check lactic acidosis. Aspiration PNA -  Finished course of therapy with pip/tazo   Marylu Dudenhoeffer B. [facility] [phone]

## 2014-02-08 ENCOUNTER — Inpatient Hospital Stay (HOSPITAL_COMMUNITY): Payer: Medicaid Other

## 2014-02-08 ENCOUNTER — Encounter: Payer: Self-pay | Admitting: Infectious Diseases

## 2014-02-08 DIAGNOSIS — R4182 Altered mental status, unspecified: Secondary | ICD-10-CM | POA: Insufficient documentation

## 2014-02-08 DIAGNOSIS — G9340 Encephalopathy, unspecified: Secondary | ICD-10-CM | POA: Insufficient documentation

## 2014-02-08 DIAGNOSIS — N170 Acute kidney failure with tubular necrosis: Secondary | ICD-10-CM | POA: Insufficient documentation

## 2014-02-08 LAB — RESPIRATORY VIRUS PANEL
Adenovirus: NOT DETECTED
Influenza A H1: NOT DETECTED
Influenza A H3: NOT DETECTED
Influenza A: NOT DETECTED
Influenza B: NOT DETECTED
METAPNEUMOVIRUS: NOT DETECTED
PARAINFLUENZA 3 A: NOT DETECTED
Parainfluenza 1: NOT DETECTED
Parainfluenza 2: NOT DETECTED
Respiratory Syncytial Virus A: NOT DETECTED
Respiratory Syncytial Virus B: NOT DETECTED
Rhinovirus: NOT DETECTED

## 2014-02-08 LAB — COMPREHENSIVE METABOLIC PANEL
ALBUMIN: 1.7 g/dL — AB (ref 3.5–5.2)
ALT: 28 U/L (ref 0–35)
AST: 25 U/L (ref 0–37)
Alkaline Phosphatase: 82 U/L (ref 39–117)
Anion gap: 19 — ABNORMAL HIGH (ref 5–15)
BUN: 76 mg/dL — ABNORMAL HIGH (ref 6–23)
CHLORIDE: 107 meq/L (ref 96–112)
CO2: 13 mEq/L — ABNORMAL LOW (ref 19–32)
CREATININE: 3.51 mg/dL — AB (ref 0.50–1.10)
Calcium: 8.5 mg/dL (ref 8.4–10.5)
GFR calc Af Amer: 17 mL/min — ABNORMAL LOW (ref >90)
GFR calc non Af Amer: 15 mL/min — ABNORMAL LOW (ref >90)
Glucose, Bld: 151 mg/dL — ABNORMAL HIGH (ref 70–99)
POTASSIUM: 5.5 meq/L — AB (ref 3.7–5.3)
Sodium: 139 mEq/L (ref 137–147)
TOTAL PROTEIN: 6.4 g/dL (ref 6.0–8.3)

## 2014-02-08 LAB — URINALYSIS, ROUTINE W REFLEX MICROSCOPIC
Bilirubin Urine: NEGATIVE
GLUCOSE, UA: NEGATIVE mg/dL
Hgb urine dipstick: NEGATIVE
Ketones, ur: NEGATIVE mg/dL
LEUKOCYTES UA: NEGATIVE
Nitrite: NEGATIVE
Protein, ur: NEGATIVE mg/dL
Specific Gravity, Urine: 1.017 (ref 1.005–1.030)
Urobilinogen, UA: 0.2 mg/dL (ref 0.0–1.0)
pH: 5 (ref 5.0–8.0)

## 2014-02-08 LAB — RENAL FUNCTION PANEL
Albumin: 1.6 g/dL — ABNORMAL LOW (ref 3.5–5.2)
Anion gap: 17 — ABNORMAL HIGH (ref 5–15)
BUN: 85 mg/dL — ABNORMAL HIGH (ref 6–23)
CALCIUM: 8.3 mg/dL — AB (ref 8.4–10.5)
CO2: 14 meq/L — AB (ref 19–32)
Chloride: 109 mEq/L (ref 96–112)
Creatinine, Ser: 3.79 mg/dL — ABNORMAL HIGH (ref 0.50–1.10)
GFR calc Af Amer: 16 mL/min — ABNORMAL LOW (ref >90)
GFR, EST NON AFRICAN AMERICAN: 13 mL/min — AB (ref >90)
Glucose, Bld: 148 mg/dL — ABNORMAL HIGH (ref 70–99)
PHOSPHORUS: 6.9 mg/dL — AB (ref 2.3–4.6)
Potassium: 5.2 mEq/L (ref 3.7–5.3)
Sodium: 140 mEq/L (ref 137–147)

## 2014-02-08 LAB — SODIUM, URINE, RANDOM: Sodium, Ur: 49 mEq/L

## 2014-02-08 LAB — CULTURE, BLOOD (ROUTINE X 2)
CULTURE: NO GROWTH
Culture: NO GROWTH

## 2014-02-08 LAB — GLUCOSE, CAPILLARY
GLUCOSE-CAPILLARY: 122 mg/dL — AB (ref 70–99)
GLUCOSE-CAPILLARY: 143 mg/dL — AB (ref 70–99)
GLUCOSE-CAPILLARY: 146 mg/dL — AB (ref 70–99)
Glucose-Capillary: 175 mg/dL — ABNORMAL HIGH (ref 70–99)
Glucose-Capillary: 141 mg/dL — ABNORMAL HIGH (ref 70–99)
Glucose-Capillary: 159 mg/dL — ABNORMAL HIGH (ref 70–99)

## 2014-02-08 LAB — CBC
HEMATOCRIT: 28.6 % — AB (ref 36.0–46.0)
HEMOGLOBIN: 9.6 g/dL — AB (ref 12.0–15.0)
MCH: 25.4 pg — ABNORMAL LOW (ref 26.0–34.0)
MCHC: 33.6 g/dL (ref 30.0–36.0)
MCV: 75.7 fL — AB (ref 78.0–100.0)
Platelets: 364 10*3/uL (ref 150–400)
RBC: 3.78 MIL/uL — AB (ref 3.87–5.11)
RDW: 18.1 % — AB (ref 11.5–15.5)
WBC: 14.9 10*3/uL — AB (ref 4.0–10.5)

## 2014-02-08 LAB — PHOSPHORUS: PHOSPHORUS: 5.5 mg/dL — AB (ref 2.3–4.6)

## 2014-02-08 LAB — MISCELLANEOUS TEST

## 2014-02-08 LAB — PROTEIN / CREATININE RATIO, URINE
Creatinine, Urine: 52.59 mg/dL
Protein Creatinine Ratio: 0.55 — ABNORMAL HIGH (ref 0.00–0.15)
Total Protein, Urine: 28.7 mg/dL

## 2014-02-08 LAB — CMV (CYTOMEGALOVIRUS) DNA ULTRAQUANT, PCR: CMV DNA Quant: <200 copies/mL

## 2014-02-08 LAB — CREATININE, URINE, RANDOM: Creatinine, Urine: 53.4 mg/dL

## 2014-02-08 MED ORDER — SODIUM BICARBONATE 8.4 % IV SOLN
INTRAVENOUS | Status: DC
  Administered 2014-02-08 – 2014-02-12 (×3): via INTRAVENOUS
  Filled 2014-02-08 (×9): qty 1000

## 2014-02-08 MED ORDER — PANTOPRAZOLE SODIUM 40 MG IV SOLR
40.0000 mg | INTRAVENOUS | Status: DC
  Administered 2014-02-08: 40 mg via INTRAVENOUS
  Filled 2014-02-08: qty 40

## 2014-02-08 MED ORDER — LAMIVUDINE 10 MG/ML PO SOLN
100.0000 mg | Freq: Every day | ORAL | Status: DC
  Administered 2014-02-09 – 2014-02-22 (×14): 100 mg via ORAL
  Filled 2014-02-08 (×15): qty 10

## 2014-02-08 MED ORDER — PANTOPRAZOLE SODIUM 40 MG PO PACK
40.0000 mg | PACK | Freq: Every day | ORAL | Status: DC
  Administered 2014-02-08 – 2014-02-12 (×5): 40 mg
  Filled 2014-02-08 (×6): qty 20

## 2014-02-08 MED ORDER — LAMIVUDINE 10 MG/ML PO SOLN
150.0000 mg | Freq: Once | ORAL | Status: AC
  Administered 2014-02-08: 150 mg
  Filled 2014-02-08: qty 15

## 2014-02-08 MED ORDER — DOLUTEGRAVIR SODIUM 50 MG PO TABS
50.0000 mg | ORAL_TABLET | Freq: Two times a day (BID) | ORAL | Status: DC
  Administered 2014-02-08 – 2014-02-23 (×31): 50 mg via ORAL
  Filled 2014-02-08 (×34): qty 1

## 2014-02-08 MED ORDER — SODIUM POLYSTYRENE SULFONATE 15 GM/60ML PO SUSP
30.0000 g | Freq: Once | ORAL | Status: AC
  Administered 2014-02-08: 30 g
  Filled 2014-02-08: qty 120

## 2014-02-08 MED ORDER — ABACAVIR SULFATE 300 MG PO TABS
600.0000 mg | ORAL_TABLET | Freq: Every day | ORAL | Status: DC
  Administered 2014-02-08 – 2014-02-23 (×16): 600 mg via NASOGASTRIC
  Filled 2014-02-08 (×16): qty 2

## 2014-02-08 NOTE — Progress Notes (Addendum)
[facility]    Day 11 Acyclovir DAy 4IV clinda/primaquin (day 6 pcp tx) Day 4emb/rifabutin/azithro  Zosyn D/cd after 7 days    Subjective: oN VENTILATOR   Antibiotics:  Anti-infectives    Start     Dose/Rate Route Frequency Ordered Stop   02/08/14 1600  azithromycin (ZITHROMAX) tablet 1,200 mg  Status:  Discontinued     1,200 mg Per Tube Weekly 02/03/14 0955 02/03/14 1012   02/08/14 1000  azithromycin (ZITHROMAX) 200 MG/5ML suspension 1,200 mg  Status:  Discontinued     1,200 mg Oral Weekly 02/03/14 1013 02/03/14 1015   02/08/14 1000  azithromycin (ZITHROMAX) 200 MG/5ML suspension 1,200 mg  Status:  Discontinued     1,200 mg Per Tube Weekly 02/03/14 1015 02/05/14 1542   02/06/14 1200  acyclovir (ZOVIRAX) 470 mg in dextrose 5 % 100 mL IVPB     10 mg/kg  47 kg109.4 mL/hr over 60 Minutes Intravenous Every 24 hours 02/06/14 1110     02/05/14 2200  clindamycin (CLEOCIN) IVPB 600 mg     600 mg100 mL/hr over 30 Minutes Intravenous 3 times per day 02/05/14 1542     02/05/14 2200  primaquine tablet 30 mg     30 mg Per Tube Every 24 hours 02/05/14 1542     02/05/14 1700  azithromycin (ZITHROMAX) 200 MG/5ML suspension 600 mg     600 mg Per Tube Daily 02/05/14 1542     02/05/14 1700  ethambutol (MYAMBUTOL) tablet 800 mg  Status:  Discontinued     800 mg Oral Every 24 hours 02/05/14 1542 02/05/14 1545   02/05/14 1700  ethambutol (MYAMBUTOL) tablet 800 mg     800 mg Per Tube Every 24 hours 02/05/14 1545     02/05/14 1600  rifabutin (MYCOBUTIN) capsule 150 mg     150 mg Oral Daily 02/05/14 1542     02/05/14 1200  acyclovir (ZOVIRAX) 450 mg in dextrose 5 % 100 mL IVPB  Status:  Discontinued     10 mg/kg  45 kg109 mL/hr over 60 Minutes Intravenous Every 24 hours 02/04/14 1554 02/04/14 1601     02/05/14 1200  acyclovir (ZOVIRAX) 400 mg in dextrose 5 % 100 mL IVPB  Status:  Discontinued     10 mg/kg  40 kg (Order-Specific)108 mL/hr over 60 Minutes Intravenous Every 24 hours 02/04/14 1601 02/06/14 1110   02/05/14 1200  sulfamethoxazole-trimethoprim (BACTRIM) 240 mg of trimethoprim in dextrose 5 % 250 mL IVPB  Status:  Discontinued     240 mg of trimethoprim265 mL/hr over 60 Minutes Intravenous Every 12 hours 02/05/14 1156 02/05/14 1510   02/05/14 1000  fluconazole (DIFLUCAN) 40 MG/ML suspension 100 mg     100 mg Per Tube Daily 02/04/14 1759     02/04/14 2000  piperacillin-tazobactam (ZOSYN) IVPB 2.25 g     2.25 g100 mL/hr over 30 Minutes Intravenous Every 8 hours 02/04/14 1613 02/04/14 2121   02/04/14 1100  ganciclovir (CYTOVENE) 55 mg in sodium chloride 0.9 % 100 mL IVPB  Status:  Discontinued     1.25 mg/kg  45 kg100 mL/hr over 60 Minutes Intravenous Every 24 hours 02/04/14 1007 02/04/14 1450   02/04/14 1000  fluconazole (DIFLUCAN) tablet 400 mg  Status:  Discontinued     400 mg Per Tube Daily 02/03/14 0955 02/03/14 1015   02/04/14 1000  fluconazole (DIFLUCAN) 40 MG/ML suspension 400 mg  Status:  Discontinued     400 mg Per Tube Daily  02/03/14 1015 02/03/14 1136   02/04/14 1000  fluconazole (DIFLUCAN) 40 MG/ML suspension 200 mg  Status:  Discontinued     200 mg Per Tube Daily 02/03/14 1136 02/04/14 1759   02/04/14 1000  acyclovir (ZOVIRAX) 400 mg in dextrose 5 % 100 mL IVPB  Status:  Discontinued     10 mg/kg  40 kg108 mL/hr over 60 Minutes Intravenous Every 24 hours 02/03/14 1412 02/04/14 0952   02/04/14 0000  sulfamethoxazole-trimethoprim (BACTRIM) 200 mg of trimethoprim in dextrose 5 % 250 mL IVPB  Status:  Discontinued     5 mg/kg of trimethoprim  40 kg262.5 mL/hr over 60 Minutes Intravenous Every 12 hours 02/03/14 1412 02/05/14 1156   02/03/14 2000  piperacillin-tazobactam (ZOSYN) IVPB 2.25 g  Status:  Discontinued     2.25 g100 mL/hr over 30 Minutes Intravenous Every 8  hours 02/03/14 1412 02/04/14 1613   02/02/14 1400  sulfamethoxazole-trimethoprim (BACTRIM) 200 mg in dextrose 5 % 250 mL IVPB  Status:  Discontinued     15 mg/kg/day  40 kg262.5 mL/hr over 60 Minutes Intravenous 3 times per day 02/02/14 1354 02/03/14 1412   02/02/14 1300  fluconazole (DIFLUCAN) tablet 400 mg  Status:  Discontinued     400 mg Oral Daily 02/02/14 1026 02/03/14 0955   02/01/14 1700  sulfamethoxazole-trimethoprim (BACTRIM DS) 800-160 MG per tablet 1 tablet  Status:  Discontinued     1 tablet Oral Once per day on Mon Wed Fri 02/01/14 1549 02/02/14 1316   02/01/14 1600  azithromycin (ZITHROMAX) tablet 1,200 mg  Status:  Discontinued     1,200 mg Oral Weekly 02/01/14 1515 02/03/14 0955   02/01/14 1000  acyclovir (ZOVIRAX) 420 mg in dextrose 5 % 100 mL IVPB  Status:  Discontinued     10 mg/kg  41.9 kg108.4 mL/hr over 60 Minutes Intravenous Every 12 hours 02/01/14 0909 02/03/14 1412   02/01/14 1000  vancomycin (VANCOCIN) IVPB 750 mg/150 ml premix  Status:  Discontinued     750 mg150 mL/hr over 60 Minutes Intravenous Every 24 hours 02/01/14 0910 02/03/14 0910   02/01/14 0823  vancomycin (VANCOCIN) 500 mg in sodium chloride 0.9 % 100 mL IVPB  Status:  Discontinued     500 mg100 mL/hr over 60 Minutes Intravenous Every 24 hours 01/31/14 1029 02/01/14 0909   01/31/14 1200  piperacillin-tazobactam (ZOSYN) IVPB 3.375 g  Status:  Discontinued     3.375 g12.5 mL/hr over 240 Minutes Intravenous Every 8 hours 01/31/14 1029 02/03/14 1412   01/30/14 2100  acyclovir (ZOVIRAX) 390 mg in dextrose 5 % 100 mL IVPB  Status:  Discontinued     10 mg/kg  39.1 kg107.8 mL/hr over 60 Minutes Intravenous Every 24 hours 01/30/14 2018 02/01/14 0908   01/29/14 1600  acyclovir (ZOVIRAX) 420 mg in dextrose 5 % 100 mL IVPB  Status:  Discontinued     420 mg108.4 mL/hr over 60 Minutes Intravenous Every 24 hours 01/29/14 1457 01/29/14 2015   01/29/14 0800  cefTRIAXone (ROCEPHIN) 1 g in dextrose 5 % 50 mL IVPB  Status:   Discontinued     1 g100 mL/hr over 30 Minutes Intravenous Every 24 hours 01/29/14 0707 01/29/14 0710   01/29/14 0800  vancomycin (VANCOCIN) 500 mg in sodium chloride 0.9 % 100 mL IVPB  Status:  Discontinued     500 mg100 mL/hr over 60 Minutes Intravenous Every 48 hours 01/29/14 0719 01/31/14 1029   01/29/14 0730  piperacillin-tazobactam (ZOSYN) IVPB 2.25 g  Status:  Discontinued  2.25 g100 mL/hr over 30 Minutes Intravenous 4 times per day 01/29/14 0719 01/31/14 1028   01/29/14 0715  azithromycin (ZITHROMAX) 500 mg in dextrose 5 % 250 mL IVPB  Status:  Discontinued     500 mg250 mL/hr over 60 Minutes Intravenous Every 24 hours 01/29/14 0704 01/29/14 0710      Medications: Scheduled Meds: . sodium chloride   Intravenous Once  . acyclovir  10 mg/kg Intravenous Q24H  . antiseptic oral rinse  7 mL Mouth Rinse QID  . aspirin  81 mg Oral Daily  . azithromycin  600 mg Per Tube Daily  . chlorhexidine  15 mL Mouth Rinse BID  . clindamycin (CLEOCIN) IV  600 mg Intravenous 3 times per day  . ethambutol  800 mg Per Tube Q24H  . fluconazole  100 mg Per Tube Daily  . heparin subcutaneous  5,000 Units Subcutaneous 3 times per day  . labetalol  100 mg Oral BID  . levETIRAcetam  500 mg Intravenous Q12H  . methylPREDNISolone (SOLU-MEDROL) injection  40 mg Intravenous Q12H  . pantoprazole sodium  40 mg Per Tube Daily  . primaquine  30 mg Per Tube Q24H  . rifabutin  150 mg Oral Daily  . sodium chloride  3 mL Intravenous Q12H   Continuous Infusions: . feeding supplement (JEVITY 1.2 CAL) 60 mL/hr at 02/07/14 1711  . fentaNYL infusion INTRAVENOUS 100 mcg/hr (02/08/14 0900)   PRN Meds:.acetaminophen (TYLENOL) oral liquid 160 mg/5 mL, fentaNYL, hydrALAZINE, metoprolol, ondansetron **OR** ondansetron (ZOFRAN) IV    Objective: Weight change: 1 lb 15.8 oz (0.9 kg)  Intake/Output Summary (Last 24 hours) at 02/08/14 1044 Last data filed at 02/08/14 0900  Gross per 24 hour  Intake 2838.8 ml  Output    1125 ml  Net 1713.8 ml   Blood pressure 124/74, pulse 99, temperature 98.1 F (36.7 C), temperature source Oral, resp. rate 24, height 5' 7"  (1.702 m), weight 121 lb 0.5 oz (54.9 kg), SpO2 100 %. Temp:  [97.8 F (36.6 C)-98.5 F (36.9 C)] 98.1 F (36.7 C) (11/02 0819) Pulse Rate:  [83-126] 99 (11/02 1000) Resp:  [21-34] 24 (11/02 1000) BP: (124-187)/(67-103) 124/74 mmHg (11/02 1000) SpO2:  [100 %] 100 % (11/02 1000) FiO2 (%):  [40 %] 40 % (11/02 1000) Weight:  [121 lb 0.5 oz (54.9 kg)] 121 lb 0.5 oz (54.9 kg) (11/02 0500)  Physical Exam: General: OPENS EYES TO NAME AND , restrained on ventilator HEENT: anicteric sclera, pupils reactive to light and accommodation, EOMI CVS tachycardical r,  no murmur rubs or gallops Chest: coarse breath sounds bilaterally Abdomen: softnondistended, normal bowel sounds, Skin: no rashes Lymph: no new lymphadenopathy Neuro: nonfocal  CBC:  CBC Latest Ref Rng 02/08/2014 02/07/2014 02/06/2014  WBC 4.0 - 10.5 K/uL 14.9(H) 11.5(H) 8.0  Hemoglobin 12.0 - 15.0 g/dL 9.6(L) 8.3(L) 9.0(L)  Hematocrit 36.0 - 46.0 % 28.6(L) 25.0(L) 27.0(L)  Platelets 150 - 400 K/uL 364 289 276     BMET  Recent Labs  02/07/14 0400 02/08/14 0443  NA 137 139  K 4.8 5.5*  CL 106 107  CO2 16* 13*  GLUCOSE 194* 151*  BUN 62* 76*  CREATININE 3.26* 3.51*  CALCIUM 8.1* 8.5     Liver Panel   Recent Labs  02/06/14 0500 02/08/14 0443  PROT 5.6* 6.4  ALBUMIN 1.3* 1.7*  AST <5 25  ALT <5 28  ALKPHOS 77 82  BILITOT <0.2* <0.2*       Sedimentation Rate No results for input(s):  ESRSEDRATE in the last 72 hours. C-Reactive Protein No results for input(s): CRP in the last 72 hours.  Micro Results: Recent Results (from the past 720 hour(s))  Urine culture     Status: None   Collection Time: 01/27/14  3:26 PM  Result Value Ref Range Status   Specimen Description URINE, CATHETERIZED  Final   Special Requests NONE  Final   Culture  Setup Time   Final     01/27/2014 22:22 Performed at [place] Performed at [facility]  Final   Culture NO GROWTH Performed at [facility]  Final   Report Status 01/28/2014 FINAL  Final  Culture, blood (routine x 2)     Status: None   Collection Time: 01/29/14  9:19 AM  Result Value Ref Range Status   Specimen Description BLOOD LEFT ARM  Final   Special Requests BOTTLES DRAWN AEROBIC AND ANAEROBIC 10CC  Final   Culture  Setup Time   Final    01/29/2014 15:04 Performed at [place]   Final    NO GROWTH 5 DAYS Performed at [facility]   Report Status 02/04/2014 FINAL  Final  Culture, blood (routine x 2)     Status: None   Collection Time: 01/29/14  9:30 AM  Result Value Ref Range Status   Specimen Description BLOOD LEFT HAND  Final   Special Requests BOTTLES DRAWN AEROBIC AND ANAEROBIC 5CC  Final   Culture  Setup Time   Final    01/29/2014 15:04 Performed at [place]   Final    NO GROWTH 5 DAYS Performed at [facility]   Report Status 02/04/2014 FINAL  Final  Clostridium Difficile by PCR     Status: None   Collection Time: 01/29/14 11:10 AM  Result Value Ref Range Status   C difficile by pcr NEGATIVE NEGATIVE Final  MRSA PCR Screening     Status: Abnormal   Collection Time: 01/29/14  3:40 PM  Result Value Ref Range Status   MRSA by PCR POSITIVE (A) NEGATIVE Final    Comment:        The GeneXpert MRSA Assay (FDA approved for NASAL specimens only), is one component of a comprehensive MRSA colonization surveillance program. It is not intended to diagnose MRSA infection nor to guide or monitor treatment for MRSA infections. RESULT CALLED TO, READ BACK BY AND VERIFIED WITH: Sherry Ruffing RN 17:50 01/29/14 ([email or web address])  AFB culture, blood     Status: None (Preliminary result)   Collection Time: 01/30/14 11:13 AM  Result Value Ref Range Status   Specimen Description BLOOD LEFT ARM  Final    Special Requests BOTTLES DRAWN AEROBIC ONLY [place]  Final   Culture   Final    CULTURE WILL BE EXAMINED FOR 6 WEEKS BEFORE ISSUING A FINAL REPORT Performed at [facility]   Report Status PENDING  Incomplete  GC/Chlamydia Probe Amp     Status: None   Collection Time: 01/30/14 11:30 AM  Result Value Ref Range Status   CT Probe RNA NEGATIVE NEGATIVE Final   GC Probe RNA NEGATIVE NEGATIVE Final    Comment: (NOTE)                                                                                       **  Normal Reference Range: Negative**      Assay performed using the Gen-Probe APTIMA COMBO2 (R) Assay. Acceptable specimen types for this assay include APTIMA Swabs (Unisex, endocervical, urethral, or vaginal), first void urine, and ThinPrep liquid based cytology samples. Performed at [facility]  CSF culture     Status: None   Collection Time: 01/30/14  5:01 PM  Result Value Ref Range Status   Specimen Description CSF  Final   Special Requests NONE  Final   Gram Stain   Final    CYTOSPIN SLIDE WBC PRESENT, PREDOMINANTLY MONONUCLEAR NO ORGANISMS SEEN Performed at [facility] Performed at [place]   Final    NO GROWTH 3 DAYS Performed at [facility]   Report Status 02/03/2014 FINAL  Final  Gram stain     Status: None   Collection Time: 01/30/14  5:01 PM  Result Value Ref Range Status   Specimen Description CSF  Final   Special Requests NONE  Final   Gram Stain   Final    CYTOSPIN SLIDE WBC PRESENT, PREDOMINANTLY MONONUCLEAR NO ORGANISMS SEEN   Report Status 01/30/2014 FINAL  Final  Fungus culture, blood     Status: None (Preliminary result)   Collection Time: 02/01/14  2:00 PM  Result Value Ref Range Status   Specimen Description BLOOD LEFT HAND  Final   Special Requests BOTTLES DRAWN AEROBIC AND ANAEROBIC 10CC  Final   Culture   Final    NO FUNGUS ISOLATED;CULTURE IN PROGRESS FOR 7 DAYS Performed at [facility]    Report Status PENDING  Incomplete  Culture, blood (routine x 2)     Status: None (Preliminary result)   Collection Time: 02/02/14 12:07 AM  Result Value Ref Range Status   Specimen Description BLOOD RIGHT HAND  Final   Special Requests BOTTLES DRAWN AEROBIC ONLY [place]  Final   Culture  Setup Time   Final    02/02/2014 03:55 Performed at [facility]   Culture   Final           BLOOD CULTURE RECEIVED NO GROWTH TO DATE CULTURE WILL BE HELD FOR 5 DAYS BEFORE ISSUING A FINAL NEGATIVE REPORT Performed at [facility]   Report Status PENDING  Incomplete  Culture, blood (routine x 2)     Status: None (Preliminary result)   Collection Time: 02/02/14 12:18 AM  Result Value Ref Range Status   Specimen Description BLOOD LEFT HAND  Final   Special Requests BOTTLES DRAWN AEROBIC AND ANAEROBIC [place]  Final   Culture  Setup Time   Final    02/02/2014 03:55 Performed at [facility]   Culture   Final           BLOOD CULTURE RECEIVED NO GROWTH TO DATE CULTURE WILL BE HELD FOR 5 DAYS BEFORE ISSUING A FINAL NEGATIVE REPORT Performed at [facility]   Report Status PENDING  Incomplete  Pneumocystis smear by DFA     Status: None   Collection Time: 02/03/14  2:30 PM  Result Value Ref Range Status   Specimen Source-PJSRC SPUTUM  Final   Pneumocystis jiroveci Ag POSITIVE  Final    Comment: Performed at [place] RN 02/04/14 1444 BY [email or web address]  Culture, respiratory (NON-Expectorated)     Status: None   Collection Time: 02/04/14  3:50 AM  Result Value Ref Range Status   Specimen Description TRACHEAL ASPIRATE  Final   Special Requests Immunocompromised  Final   Gram Stain   Final    FEW WBC PRESENT,BOTH PMN AND MONONUCLEAR RARE SQUAMOUS EPITHELIAL CELLS PRESENT NO ORGANISMS SEEN Performed at [facility]   Culture   Final    NO GROWTH 2 DAYS Performed at [facility]   Report Status 02/06/2014 FINAL  Final    AFB culture with smear     Status: None (Preliminary result)   Collection Time: 02/04/14  3:50 AM  Result Value Ref Range Status   Specimen Description TRACHEAL ASPIRATE  Final   Special Requests NONE  Final   Acid Fast Smear   Final    1+ ACID FAST BACILLI SEEN CRITICAL RESULT CALLED TO, READ BACK BY AND VERIFIED WITH: CIARFI 14:55 110/30/15 FUENG CRITICAL RESULT CALLED TO, READ BACK BY AND VERIFIED WITH: LEE ANN STIMPSON H.D. 10:30 02/08/14 FUENG Performed at [facility]    Culture   Final    CULTURE WILL BE EXAMINED FOR 6 WEEKS BEFORE ISSUING A FINAL REPORT Performed at [facility]    Report Status PENDING  Incomplete  Fungus Culture with Smear     Status: None (Preliminary result)   Collection Time: 02/04/14  3:50 AM  Result Value Ref Range Status   Specimen Description TRACHEAL ASPIRATE  Final   Special Requests NONE  Final   Fungal Smear   Final    NO YEAST OR FUNGAL ELEMENTS SEEN Performed at [facility]   Culture   Final    CULTURE IN PROGRESS FOR FOUR WEEKS Performed at [facility]   Report Status PENDING  Incomplete  Culture, bal-quantitative     Status: None   Collection Time: 02/04/14 10:31 AM  Result Value Ref Range Status   Specimen Description BRONCHIAL ALVEOLAR LAVAGE  Final   Special Requests Immunocompromised  Final   Gram Stain   Final    FEW WBC PRESENT, PREDOMINANTLY MONONUCLEAR RARE SQUAMOUS EPITHELIAL CELLS PRESENT NO ORGANISMS SEEN Performed at [place] NO GROWTH Performed at [facility]  Final   Culture   Final    NO GROWTH 2 DAYS Performed at [facility]   Report Status 02/06/2014 FINAL  Final    Studies/Results: Dg Chest Port 1 View  02/08/2014   CLINICAL DATA:  Respiratory failure.  EXAM: PORTABLE CHEST - 1 VIEW  COMPARISON:  02/07/2014.  FINDINGS: Tracheostomy to, feeding tube, left IJ line in stable position. Mediastinum and hilar structures are unremarkable.  Heart size stable. Mild right lower lobe infiltrate cannot be excluded. No pleural effusion or pneumothorax. Left costophrenic angle not imaged.  IMPRESSION: 1. Line and tubes in stable position. 2. Mild infiltrate right lower lobe cannot be excluded.   Electronically Signed   By: Marcello Moores  Register   On: 02/08/2014 07:18   Dg Chest Port 1 View  02/07/2014   CLINICAL DATA:  Respiratory failure/hypoxia  EXAM: PORTABLE CHEST - 1 VIEW  COMPARISON:  February 06, 2014  FINDINGS: Endotracheal tube tip is 5.8 cm above the carina. Central catheter tip is at the cavoatrial junction. Feeding tube tip is below the diaphragm. No pneumothorax. Lungs are clear. Heart size and pulmonary vascularity are normal. No adenopathy. No bone lesions.  IMPRESSION: Tube and catheter positions as described without pneumothorax. No edema or consolidation.   Electronically Signed   By: Lowella Grip M.D.   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  --continue acyclovir  #2 PCP Pneumonia: continue clindamycin Primaquine and steroids  #4 Worsening kidney fxn : likely has component of ATN, HIVAN possible  Though spot proteinuria 30.  I would like to start ARVs on her but will avoid TNF  #4 Fevers, diarrhea:  empiric M avium drugs started, CMV colitis possible as well CMV PCR blood pending, repeat C diff PCR sent  #5 HIV/AIDS: will start her on ARV regimen that is renall adjusted. Given HLA B701 negative, likely Abacavir liquid, Epivir liquid and Tivicay per tube  NOTE HER ENCEPHALOPATHY COULD BE IN PART DUE TO UNCONTROLLED HIV VIREMIA AND DIRECT CNS TOXICITY OF HIV VIRUS. COULD ALSO BE CONSIDERED INTO HER RENAL FAILURE AND PERSISTENT FEVER SO i THINK STARTING TREATMENT IS IMPERATIVE  #6 afb ON CULTURES FROM LUNGS LIKELY IS mYCOBACTERIUM AVIUM BUT pcr ARE PENDING   LOS: 62 days   Alcide Evener 02/08/2014, 10:44 AM

## 2014-02-08 NOTE — Plan of Care (Signed)
Problem: Phase I Progression Outcomes Goal: Hemodynamically stable Outcome: Not Applicable Date Met:  78/46/96

## 2014-02-08 NOTE — Plan of Care (Signed)
Problem: Phase I Progression Outcomes Goal: Dyspnea controlled at rest Outcome: Not Applicable Date Met:  32/34/68

## 2014-02-08 NOTE — Plan of Care (Signed)
Problem: Consults Goal: RH STROKE PATIENT EDUCATION See Patient Education module for education specifics  Outcome: Not Applicable Date Met:  09/79/64 Pt negative cva  Comments:  Pt negative cva

## 2014-02-08 NOTE — Consult Note (Signed)
Reason for Consult: Acute renal failure Referring Physician: Dr. Jordan Likes   HPI: Desiree Hale is an 43 y.o. female with PMH significant for hypertension and diabetes mellitus type 2. Admitted on 01/27/2014 for altered mental status and unresponsiveness. Patient was found unresponsive at home, shaking and laying in a pool of stool and urine. Creatinine on admission of 3.07 with unknown history of kidney pathology and unknown baseline. Patient with ibuprofen on home medication list. Patient received MRI and CT scans without contrast. 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   Patient exposed to many different anti-infectives included below. Creatinine with transient improvement to 1.45 on 10/27, but steadily rising since 10/29  and currently back up to 3.51. She has developed a progressive metabolic acidosis, with anion gap. Non-oliguric but with declining UOP today.  BP's have been quite labile, with systolics as high as 170-180, dipping into low 100's for past 2-3 days. She required kayexalate today for K of 5.5  Antiinfectives Vancomycin (10/25>>10/28) Bactrim (10/27>>10/30) Ganciclovir (10/29>>10/29) Zosyn (10/23>>10/30) Acyclovir (10/23>> Rifabutin (10/30>> Primaquine (10/30>> Fluconazole (10/27>> Ethambutol (10/30>> Clindamycin (10/30>> Azithromycin (10/26>> Lamivudine (11/2>> Abacavir (11/2>> Dolutegravir  (11/2>>  Trend in Creatinine: CREATININE, SER  Date/Time Value Ref Range Status  02/08/2014 04:43 AM 3.51* 0.50 - 1.10 mg/dL Final  16/01/9603 54:09 AM 3.26* 0.50 - 1.10 mg/dL Final  81/19/1478 29:56 AM 2.74* 0.50 - 1.10 mg/dL Final  21/30/8657 84:69 AM 2.83* 0.50 - 1.10 mg/dL Final  62/95/2841 32:44 PM 2.65* 0.50 - 1.10 mg/dL Final  04/11/7251 66:44 AM 2.27* 0.50 - 1.10 mg/dL Final  03/47/4259 56:38 PM 2.14* 0.50 - 1.10 mg/dL Final  75/64/3329 51:88 AM 1.45* 0.50 - 1.10 mg/dL Final  41/66/0630 16:01 AM 1.49* 0.50 - 1.10 mg/dL Final   09/32/3557 32:20 AM 1.64* 0.50 - 1.10 mg/dL Final  25/42/7062 37:62 AM 2.10* 0.50 - 1.10 mg/dL Final  83/15/1761 60:73 AM 2.67* 0.50 - 1.10 mg/dL Final  71/09/2692 85:46 AM 2.59* 0.50 - 1.10 mg/dL Final  27/06/5007 38:18 PM 3.07* 0.50 - 1.10 mg/dL Final    PMH:   Past Medical History  Diagnosis Date  . Hypertension   . HIV (human immunodeficiency virus infection)   . Herpes zoster   . Headache   . Diabetes mellitus     PSH:  History reviewed. No pertinent past surgical history.  Allergies:  Allergies  Allergen Reactions  . Ace Inhibitors Anaphylaxis  . Oxycodone Other (See Comments)    Medications:   Prior to Admission medications   Medication Sig Start Date End Date Taking? Authorizing Provider  gabapentin (NEURONTIN) 300 MG capsule Take 600 mg by mouth 2 (two) times daily.   Yes Historical Provider, MD  Ibuprofen-Diphenhydramine HCl (ADVIL PM) 200-25 MG CAPS Take 1 tablet by mouth at bedtime as needed (for sleep and pain).   Yes Historical Provider, MD    Inpatient medications: . sodium chloride   Intravenous Once  . abacavir  600 mg Per NG tube Daily  . acyclovir  10 mg/kg Intravenous Q24H  . antiseptic oral rinse  7 mL Mouth Rinse QID  . aspirin  81 mg Oral Daily  . azithromycin  600 mg Per Tube Daily  . chlorhexidine  15 mL Mouth Rinse BID  . clindamycin (CLEOCIN) IV  600 mg Intravenous 3 times per day  . dolutegravir  50 mg Oral BID  . ethambutol  800 mg Per Tube Q24H  . fluconazole  100 mg Per Tube Daily  .  heparin subcutaneous  5,000 Units Subcutaneous 3 times per day  . labetalol  100 mg Oral BID  . [START ON 02/09/2014] lamiVUDine  100 mg Oral Daily  . levETIRAcetam  500 mg Intravenous Q12H  . methylPREDNISolone (SOLU-MEDROL) injection  40 mg Intravenous Q12H  . pantoprazole sodium  40 mg Per Tube Daily  . primaquine  30 mg Per Tube Q24H  . rifabutin  150 mg Oral Daily    Discontinued Meds:   Medications Discontinued During This Encounter   Medication Reason  . enoxaparin (LOVENOX) injection 40 mg   . azithromycin (ZITHROMAX) 500 mg in dextrose 5 % 250 mL IVPB   . cefTRIAXone (ROCEPHIN) 1 g in dextrose 5 % 50 mL IVPB   . 0.9 %  sodium chloride infusion   . hydrALAZINE (APRESOLINE) injection 10 mg   . insulin aspart (novoLOG) injection 0-9 Units   . metoprolol (LOPRESSOR) injection 2.5 mg   . dextrose 5 %-0.45 % sodium chloride infusion   . enoxaparin (LOVENOX) injection 30 mg   . acyclovir (ZOVIRAX) 420 mg in dextrose 5 % 100 mL IVPB   . dextrose 5 % and 0.45% NaCl 1,000 mL with sodium bicarbonate 100 mEq infusion   . potassium chloride 10 mEq in 100 mL IVPB   . piperacillin-tazobactam (ZOSYN) IVPB 2.25 g Dose change  . vancomycin (VANCOCIN) 500 mg in sodium chloride 0.9 % 100 mL IVPB   . acyclovir (ZOVIRAX) 390 mg in dextrose 5 % 100 mL IVPB Dose change  . vancomycin (VANCOCIN) 500 mg in sodium chloride 0.9 % 100 mL IVPB Dose change  . diphenhydramine-acetaminophen (TYLENOL PM) 25-500 MG TABS Duplicate  . metoprolol (LOPRESSOR) injection 5 mg   . metoprolol (LOPRESSOR) injection 5 mg   . dextrose 5 % with KCl 20 mEq / L  infusion   . aspirin suppository 300 mg   . sulfamethoxazole-trimethoprim (BACTRIM DS) 800-160 MG per tablet 1 tablet   . vancomycin (VANCOCIN) IVPB 750 mg/150 ml premix   . cloNIDine (CATAPRES - Dosed in mg/24 hr) patch 0.2 mg   . metoprolol (LOPRESSOR) injection 5 mg   . azithromycin (ZITHROMAX) tablet 1,200 mg   . dextrose 5 % with KCl 20 mEq / L  infusion   . fluconazole (DIFLUCAN) tablet 400 mg   . azithromycin (ZITHROMAX) tablet 1,200 mg Reorder  . fluconazole (DIFLUCAN) tablet 400 mg Reorder  . azithromycin (ZITHROMAX) 200 MG/5ML suspension 1,200 mg   . fluconazole (DIFLUCAN) 40 MG/ML suspension 400 mg   . piperacillin-tazobactam (ZOSYN) IVPB 3.375 g   . acyclovir (ZOVIRAX) 420 mg in dextrose 5 % 100 mL IVPB   . sulfamethoxazole-trimethoprim (BACTRIM) 200 mg in dextrose 5 % 250 mL IVPB   .  dextrose 5 % with KCl 20 mEq / L  infusion   . enoxaparin (LOVENOX) injection 30 mg   . acyclovir (ZOVIRAX) 400 mg in dextrose 5 % 100 mL IVPB   . feeding supplement (JEVITY 1.2 CAL) liquid 1,000 mL   . ganciclovir (CYTOVENE) 55 mg in sodium chloride 0.9 % 100 mL IVPB   . acyclovir (ZOVIRAX) 450 mg in dextrose 5 % 100 mL IVPB   . piperacillin-tazobactam (ZOSYN) IVPB 2.25 g   . fluconazole (DIFLUCAN) 40 MG/ML suspension 200 mg   . antiseptic oral rinse (CPC / CETYLPYRIDINIUM CHLORIDE 0.05%) solution 7 mL   . fentaNYL (SUBLIMAZE) injection 50-100 mcg   . dexmedetomidine (PRECEDEX) 200 MCG/50ML (4 mcg/mL) infusion   . labetalol (NORMODYNE,TRANDATE) injection 10 mg   .  sulfamethoxazole-trimethoprim (BACTRIM) 200 mg of trimethoprim in dextrose 5 % 250 mL IVPB   . pantoprazole sodium (PROTONIX) 40 mg/20 mL oral suspension 40 mg   . sulfamethoxazole-trimethoprim (BACTRIM) 240 mg of trimethoprim in dextrose 5 % 250 mL IVPB   . azithromycin (ZITHROMAX) 200 MG/5ML suspension 1,200 mg   . ethambutol (MYAMBUTOL) tablet 800 mg   . acyclovir (ZOVIRAX) 400 mg in dextrose 5 % 100 mL IVPB   . antiseptic oral rinse (CPC / CETYLPYRIDINIUM CHLORIDE 0.05%) solution 7 mL   . chlorhexidine (PERIDEX) 0.12 % solution 15 mL   . dextrose 5 %-0.9 % sodium chloride infusion   . pantoprazole (PROTONIX) injection 40 mg   . sodium chloride 0.9 % injection 3 mL     Social History:  reports that she has been smoking Cigarettes.  She has been smoking about 0.50 packs per day. She does not have any smokeless tobacco history on file. Her alcohol and drug histories are not on file.  Family History:  No family history on file.  Review of systems not obtained due to patient factors. (pt is intubated) Weight change: 1 lb 15.8 oz (0.9 kg)  Intake/Output Summary (Last 24 hours) at 02/08/14 1707 Last data filed at 02/08/14 1700  Gross per 24 hour  Intake 2859.4 ml  Output   2120 ml  Net  739.4 ml   BP 112/62 mmHg  Pulse  81  Temp(Src) 97.5 F (36.4 C) (Oral)  Resp 27  Ht 5\' 7"  (1.702 m)  Wt 121 lb 0.5 oz (54.9 kg)  BMI 18.95 kg/m2  SpO2 100%  LMP CVP 3 Filed Vitals:   02/08/14 1518 02/08/14 1600 02/08/14 1615 02/08/14 1700  BP: 122/67 118/65  112/62  Pulse: 80 75  81  Temp:   97.5 F (36.4 C)   TempSrc:   Oral   Resp: 26 24  27   Height:      Weight:      SpO2: 100% 100%  100%     Physical Exam Gen: laying in bed, not responsive to calling, however responds to tactile stimulus Neck: Left IJ Heart: regular rate and rhythm Abdomen: Soft, no sacral edema Extremities: No edema present  Labs: Basic Metabolic Panel:  Recent Labs Lab 02/03/14 1210 02/04/14 0245 02/04/14 1500 02/05/14 0226 02/06/14 0500 02/07/14 0400 02/08/14 0443  NA 134* 135* 136* 133* 135* 137 139  K 3.6* 4.1 3.9 3.9 4.2 4.8 5.5*  CL 102 102 103 102 104 106 107  CO2 16* 16* 15* 14* 15* 16* 13*  GLUCOSE 119* 86 123* 149* 196* 194* 151*  BUN 37* 36* 40* 43* 49* 62* 76*  CREATININE 2.14* 2.27* 2.65* 2.83* 2.74* 3.26* 3.51*  ALBUMIN  --   --   --  1.4* 1.3*  --  1.7*  CALCIUM 8.6 8.6 8.3* 8.1* 7.7* 8.1* 8.5  PHOS  --   --   --  2.8 4.6 4.9* 5.5*   Liver Function Tests:  Recent Labs Lab 02/05/14 0226 02/06/14 0500 02/08/14 0443  AST 12 <5 25  ALT <5 <5 28  ALKPHOS 87 77 82  BILITOT 0.3 <0.2* <0.2*  PROT 5.8* 5.6* 6.4  ALBUMIN 1.4* 1.3* 1.7*   Recent Labs Lab 02/06/14 0500 02/06/14 1700 02/07/14 0400 02/08/14 0443  WBC 4.2 8.0 11.5* 14.9*  NEUTROABS  --   --  10.9*  --   HGB 6.8* 9.0* 8.3* 9.6*  HCT 21.0* 27.0* 25.0* 28.6*  MCV 77.5* 78.7 76.7* 75.7*  PLT  241 276 289 364   Recent Labs Lab 02/08/14 0010 02/08/14 0400 02/08/14 0817 02/08/14 1218 02/08/14 1546  GLUCAP 146* 143* 122* 175* 141*   Results for [name], Desiree (MRN [id number]) as of 02/08/2014 19:38  Ref. Range 02/03/2014 10:57 02/05/2014 11:45  Color, Urine Latest Range: YELLOW  YELLOW YELLOW  APPearance Latest Range: CLEAR  TURBID  (A) CLOUDY (A)  Specific Gravity, Urine Latest Range: 1.005-1.030  1.019 1.016  pH Latest Range: 5.0-8.0  5.5 6.0  Glucose Latest Range: NEGATIVE mg/dL NEGATIVE NEGATIVE  Bilirubin Urine Latest Range: NEGATIVE  NEGATIVE NEGATIVE  Ketones, ur Latest Range: NEGATIVE mg/dL NEGATIVE NEGATIVE  Protein Latest Range: NEGATIVE mg/dL 409100 (A) 30 (A)  Urobilinogen, UA Latest Range: 0.0-1.0 mg/dL 0.2 0.2  Nitrite Latest Range: NEGATIVE  NEGATIVE NEGATIVE  Leukocytes, UA Latest Range: NEGATIVE  NEGATIVE NEGATIVE  Hgb urine dipstick Latest Range: NEGATIVE  MODERATE (A) SMALL (A)  Urine-Other No range found AMORPHOUS URATES/... AMORPHOUS URATES/...  WBC, UA Latest Range: <3 WBC/hpf 0-2 0-2  RBC / HPF Latest Range: <3 RBC/hpf NO FORMED ELEMENT... 0-2  Squamous Epithelial / LPF Latest Range: RARE  FEW (A) RARE  Bacteria, UA Latest Range: RARE  FEW (A)   Casts Latest Range: NEGATIVE  GRANULAR CAST (A) GRANULAR CAST (A)   Xrays/Other Studies: Dg Chest Port 1 View  02/08/2014   CLINICAL DATA:  Respiratory failure.  EXAM: PORTABLE CHEST - 1 VIEW  COMPARISON:  02/07/2014.  FINDINGS: Tracheostomy to, feeding tube, left IJ line in stable position. Mediastinum and hilar structures are unremarkable. Heart size stable. Mild right lower lobe infiltrate cannot be excluded. No pleural effusion or pneumothorax. Left costophrenic angle not imaged.  IMPRESSION: 1. Line and tubes in stable position. 2. Mild infiltrate right lower lobe cannot be excluded.   Electronically Signed   By: Maisie Fushomas  Register   On: 02/08/2014 07:18   Dg Chest Port 1 View  02/07/2014   CLINICAL DATA:  Respiratory failure/hypoxia  EXAM: PORTABLE CHEST - 1 VIEW  COMPARISON:  February 06, 2014  FINDINGS: Endotracheal tube tip is 5.8 cm above the carina. Central catheter tip is at the cavoatrial junction. Feeding tube tip is below the diaphragm. No pneumothorax. Lungs are clear. Heart size and pulmonary vascularity are normal. No adenopathy. No bone lesions.   IMPRESSION: Tube and catheter positions as described without pneumothorax. No edema or consolidation.   Electronically Signed   By: Bretta BangWilliam  Woodruff M.D.   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 She was admitted with an initial creatinine of 3.07 with a transient improvement to 1.45, with progressive worsening since 10/29 and is now at 3.51. She has been exposed to many antiinfectives (some that are nephrotoxic agent)s in addition to having newly diagnosed HIV/AIDs that is most likely a contributing factor.  Assessment/Plan: 1. AKI with metabolic acidosis: Appears to be multifactorial as patient arrived with kidney injury but has also received many nephrotoxic agents. Concern patient may have HIVAN vs HIVICK as a background cause for CKD with possible superimposed   AIN vs ATN.  Relative hypotension could also be playing a part in patient's worsening kidney function. Patient currently with elevated potassium (5.5) and elevated phosphorus (5.5). Patient has had hematuria and proteinuria with granular  casts on urinalysis. Renal ultrasound negative shows echodense kidneys. Will start isotonic sodium bicarbonate, obtain renal function panel to recheck potassium and phosphorus, get an ANA and complement levels. If patient continues, will most likely require HD. Unstable for renal biopsy, which will be needed to confirm pathology. 2. Hypertension: patient currently on labetalol. 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 HIV/AIDs: recently diagnosed. Unsure if this is a known diagnosis by patient. ID consulted and started HIV therapy today.  Will need to be cautious as to not cause further kidney injury. CMV pending. Sputum culture pending (AFB positive) 5. PCP pneumonia: management per primary and infectious disease 6. Diabetes mellitus: listed in history, although no documented medications   Jacquelin Hawkingettey, Ralph 02/08/2014, 5:07 PM   I have seen and examined this patient and agree with Dr. Dennison NancyNettey's assessment with highlighted additions. Young AAF with DM, HTN by report, admitted with AMS, with subsequent workup yeilding dx of HIV/AIDS with + PCP, +AFB, HSV encephalitis.  Baseline creatinine not known.  Urinalysis with granular casts and dipstick proteinuria.  Kidneys normal sized, very echodense.  She could easily have a background of HIVAN vs HIVICK and as noted by Dr. Caleb PoppNettey, has been exposed to a myriiad of antiinfectives since admission that can be associated with superimposed ATN or AIN (most prominently vancomycin, zosyn, acyclovir, TMP-SMZ - all of these have been stopped except for the acyclovir which has been renally dose adjusted,) and it is impossible to pinpoint the specific culprit.  ID feels imperative that HIV meds be started (first doses today).  She is too sick to undergo renal biopsy for dx and as a result we will currently be in "supportive mode". A few additional serologies ordered (ANA, complements; she is Hep B and C negative) Recommend avoiding hypotension (has not been "hypotensive" per se but with 40-80 mm swings in her systolic BP and likely impaired autoregulation that may be exacerbating renal ischemia), isotonic sodium bicarbonate for her metabolic acidosis.  She may well come to the need for renal replacement therapy in the near future.     Saleem Coccia B,MD 02/08/2014 7:44 PM

## 2014-02-08 NOTE — Progress Notes (Signed)
PULMONARY / CRITICAL CARE MEDICINE   Name: Desiree BeamsKristy Sawhney MRN: [id number] DOB: May 12, 1970    ADMISSION DATE:  01/27/2014 CONSULTATION DATE:  02/03/14  REFERRING MD :  [facility]   CHIEF COMPLAINT:  AMS, dyspnea   INITIAL PRESENTATION: 43 y/o female with hx HTN, admitted 10/21 with AMS/?seizures, diagnosed as HSV encephalitis as well as PNA (?aspiration) and new dx HIV.  Followed by neurology and ID and rx with acyclovir, PCP rx.  On 10/28 developed worsening fevers, tachypnea/resp failure and PCCM consulted for tx ICU.  Further w/u showed HIV pos with CD4 =10 (new diagnosis).    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  Otherwise neg acute.  10/22  EEG  >>> normal drowsy and asleep electroencephalogram. No epileptiform activity is noted.  10/22  ECHO >>> EF 55-60%, grade 1 diastolic dysfunction, no RWMA, small free-flowing pericardial effusion 10/28  Worsening tachypnea, AMS, tx ICU.  Required intubation  10/30  Renal US >> kidneys diffusely echogenic, concerning for medical renal disease 10/31  Remains on full support, fent gtt, received 1 unit PRBC's  11/01  More awake, anemia improved 11/02 CXR Mild infiltrate right lower lobe cannot be excluded   SUBJECTIVE:     02/08/14: Bolus this morning and restarted Fentanyl due to agitation.    VITAL SIGNS: Temp:  [97.8 F (36.6 C)-98.5 F (36.9 C)] 98.1 F (36.7 C) (11/02 0819) Pulse Rate:  [82-126] 99 (11/02 1000) Resp:  [21-34] 24 (11/02 1000) BP: (124-187)/(67-103) 124/74 mmHg (11/02 1000) SpO2:  [100 %] 100 % (11/02 1000) FiO2 (%):  [40 %] 40 % (11/02 1000) Weight:  [121 lb 0.5 oz (54.9 kg)] 121 lb 0.5 oz (54.9 kg) (11/02 0500)  HEMODYNAMICS: CVP:  [3 mmHg-9 mmHg] 3 mmHg  VENTILATOR SETTINGS: Vent Mode:  [-] PRVC FiO2 (%):  [40 %] 40 % Set Rate:  [24 bmp] 24 bmp Vt Set:  [500 mL] 500 mL PEEP:  [8 cmH20] 8 cmH20 Plateau  Pressure:  [16 cmH20-20 cmH20] 19 cmH20  INTAKE / OUTPUT:  Intake/Output Summary (Last 24 hours) at 02/08/14 1019 Last data filed at 02/08/14 0900  Gross per 24 hour  Intake 2838.8 ml  Output   1125 ml  Net 1713.8 ml    PHYSICAL EXAMINATION: General:  Frail, cachectic, chronically ill appearing female Neuro:  rass -1,not moving ext. Opened eyes to voice HEENT: OETT, no jvd Cardiovascular:  s1s2 rrr,  Lungs: resp's even/non-labored, lungs coarse bilaterally  Abdomen:  Soft, +bs Musculoskeletal:  Warm and dry, no edema, foot drops boots in place  LABS:  PULMONARY  Recent Labs Lab 02/04/14 02/04/14 0207 02/06/14 0350  PHART 7.159* 7.323* 7.313*  PCO2ART 51.1* 31.3* 29.6*  PO2ART 140.0* 57.9* 241.0*  HCO3 17.0* 15.7* 14.7*  TCO2 18.5 16.7 15.6  O2SAT 97.5 87.2 99.6    CBC  Recent Labs Lab 02/06/14 1700 02/07/14 0400 02/08/14 0443  HGB 9.0* 8.3* 9.6*  HCT 27.0* 25.0* 28.6*  WBC 8.0 11.5* 14.9*  PLT 276 289 364    COAGULATION No results for input(s): INR in the last 168 hours.  CARDIAC  No results for input(s): TROPONINI in the last 168 hours. No results for input(s): PROBNP in the last 168 hours.   CHEMISTRY  Recent Labs Lab 02/04/14 1500 02/05/14 0226 02/06/14 0500 02/07/14 0400 02/08/14 0443  NA 136* 133* 135* 137 139  K 3.9 3.9 4.2  4.8 5.5*  CL 103 102 104 106 107  CO2 15* 14* 15* 16* 13*  GLUCOSE 123* 149* 196* 194* 151*  BUN 40* 43* 49* 62* 76*  CREATININE 2.65* 2.83* 2.74* 3.26* 3.51*  CALCIUM 8.3* 8.1* 7.7* 8.1* 8.5  MG  --  1.7 2.3  --   --   PHOS  --  2.8 4.6 4.9* 5.5*   Estimated Creatinine Clearance: 17.7 mL/min (by C-G formula based on Cr of 3.51).   LIVER  Recent Labs Lab 02/05/14 0226 02/06/14 0500 02/08/14 0443  AST 12 <5 25  ALT <5 <5 28  ALKPHOS 87 77 82  BILITOT 0.3 <0.2* <0.2*  PROT 5.8* 5.6* 6.4  ALBUMIN 1.4* 1.3* 1.7*     INFECTIOUS  Recent Labs Lab 02/02/14 0018 02/07/14 0358  LATICACIDVEN  --  1.2   PROCALCITON 0.76  --      ENDOCRINE CBG (last 3)   Recent Labs  02/07/14 2004 02/08/14 0010 02/08/14 0400  GLUCAP 175* 146* 143*         IMAGING x48h Dg Chest Port 1 View  02/08/2014   CLINICAL DATA:  Respiratory failure.  EXAM: PORTABLE CHEST - 1 VIEW  COMPARISON:  02/07/2014.  FINDINGS: Tracheostomy to, feeding tube, left IJ line in stable position. Mediastinum and hilar structures are unremarkable. Heart size stable. Mild right lower lobe infiltrate cannot be excluded. No pleural effusion or pneumothorax. Left costophrenic angle not imaged.  IMPRESSION: 1. Line and tubes in stable position. 2. Mild infiltrate right lower lobe cannot be excluded.   Electronically Signed   By: Maisie Fushomas  Register   On: 02/08/2014 07:18   Dg Chest Port 1 View  02/07/2014   CLINICAL DATA:  Respiratory failure/hypoxia  EXAM: PORTABLE CHEST - 1 VIEW  COMPARISON:  February 06, 2014  FINDINGS: Endotracheal tube tip is 5.8 cm above the carina. Central catheter tip is at the cavoatrial junction. Feeding tube tip is below the diaphragm. No pneumothorax. Lungs are clear. Heart size and pulmonary vascularity are normal. No adenopathy. No bone lesions.  IMPRESSION: Tube and catheter positions as described without pneumothorax. No edema or consolidation.   Electronically Signed   By: Bretta BangWilliam  Woodruff M.D.   On: 02/07/2014 11:04       ASSESSMENT / PLAN:  PULMONARY Acute Hypoxic Resp Failure  - Mild pulmonary infiltrates - PCP  - S/P Bronchoscopy - see ID  02/08/14: 40% fio2, failure to wean due to severe deconditioning and cachexia and encephalopathy P:   Full support, PRVC 500/24/8/40% PRN ABG Wean PEEP/FiO2 for sats > 92% Trend CXR SBT / WUA daily  Steroids for PCP  NEUROLOGIC CMV >> 10 EBV >> less than 200  CSF JCV >> not detected  A: Acute encephalopathy - in setting of HSV encephalitis HSV encephalitis    - openeing eyes to command  P:   RASS goal: 0 Fentanyl drip, monitor for  asynchrony  Neurology signed off  Cont keppra  Acyclovir as below  CSF studies as above    CARDIOVASCULAR HTN  Tachycardia - Resolved  SIRS  Elevated troponins - suspect demand ischemia. Resolved   Diastolic dysfunction - EF 55-60% Small Pericardial Effusion - free flowing, noted on ECHO 10/22   - maintaining bp/hr on 02/08/14  P:  Rx fever  PRN lopressor to keep HR <130 PRN hydralazine to keep SBP <170 ASA  RENAL AKI / ARF - bactrim stopped 10/30, renal US negative 10/30, UA with granular casts c/w medical renal dz.  Cr continues to rise.   Compensated AG Metabolic Acidosis - uremia ?? Lactic acid 1.1>1.2  - worsening creatinine. Meets definition for Acute Renal Failure v Renal Injury. Meds likely etiology  P:   Trend BMP Monitor I/Os Will call renal consult ;  However, not likely a good candidate for HD.     GASTROINTESTINAL Protein calorie malnutrition  Diarrhea - Improving. CDiff neg, Giardia and cryptosporidium negative  P:  Nutrition following  -TFs PPI C. Diff 11/01>> Trend LFT's with acyclovir, WNL 11/02   HEMATOLOGIC DVT prevention Anemia - tx 10/31 x 1 P:  SQ heparin Trend CBC  FOB stool    INFECTIOUS BCx2 10/23>>>neg CSF culture 10/23>>> neg UC 10/21>>>neg  BAL 10/29>> neg Sputum 10/29 >> neg Sputum PCP DFA 10/28 >> PCP pos AFB cx w/ smear 10/29>>+1 AFB  BCx2 10/27>>> Sputum AFB 10/29 >> prelim neg >> Sputum Fungal 10/29 >> prelim neg >> BAL resp viral panel 10/29 >> Sputum AFB 10/31 >>  M. TB PCR 10/31 >>   A  HIV/AIDS - new dx HSV encephalitis  Persistent Fever - afebrile 24 hours.  PCP PNA TB vs MAC  Oral thrush   P:     Acyclovir 10/26>>> Zosyn 10/25>>>10/29 Bactrim 10/27>>>10/30 Azithro (weekly, MAC prophylaxis) 10/27>>> Diflucan 10/27>>> Clinda 10/30 >>> Ethambutol 10/30 >>> Rifabutin 10/30 >>>  Antimicrobial therapies per ID Steroids IV for PCP  Repeat sputum for AFB & TB PCR 10/31  ENDOCRINE No active  issue  P:   High risk hypoglycemia  D5NS at 4ml/hr  FAMILY:   Updates: No family at bedside 02/08/14 INterdisciplinary family meet :  12 days LOS,. Needs palliative care consult    Today's Summary:  43 y/o F with new diagnosed HIV, PCP PNA, TB positive sputum (work up for TB vs MAC in progress), course complicated by poor weaning efforts, anemia and renal failure.  Continue current care for now.  GOC meeting should be established.        STAFF NOTE - added below resident note   STAFF NOTE: I, Dr Lavinia Sharps have personally reviewed patient's available data, including medical history, events of note, physical examination and test results as part of my evaluation. I have discussed with resident/NP and other care providers such as pharmacist, RN and RRT.  In addition,  I personally evaluated patient and elicited key findings of  Acute resp failure in AIDS patient due to PCP. Very poor prognosis. Will call palliative care consult. Has ATN ? Due to acyclovir. Renal consult will be called. No family at bedside3.  Rest per NP/medical resident whose note is outlined above and that I agree with  The patient is critically ill with multiple organ systems failure and requires high complexity decision making for assessment and support, frequent evaluation and titration of therapies, application of advanced monitoring technologies and extensive interpretation of multiple databases.   Critical Care Time devoted to patient care services described in this note is  30  Minutes. This time reflects time of care of this signee Dr Kalman Shan. This critical care time does not reflect procedure time, or teaching time or supervisory time of PA/NP/Med student/Med Resident etc but could involve care discussion time    Dr. Kalman Shan, M.D., [facility].C.P Pulmonary and Critical Care Medicine Staff Physician [facility] [place] [facility] Pager: [phone], If no answer or between   15:00h - 7:00h: call [phone]  02/08/2014 12:44 PM

## 2014-02-08 NOTE — Progress Notes (Signed)
ANTIBIOTIC CONSULT NOTE - FOLLOW UP  Pharmacy Consult for Clinda and Primaquine, Acyclovir, Fluconazole, Azithromycin, Ethambutol, Rifabutin Indication:  PCP, HSV encephalitis, thrush  Allergies  Allergen Reactions  . Ace Inhibitors Anaphylaxis  . Oxycodone Other (See Comments)   Patient Measurements: Height: 5\' 7"  (170.2 cm) Weight: 121 lb 0.5 oz (54.9 kg) IBW/kg (Calculated) : 61.6  Vital Signs: Temp: 98.9 F (37.2 C) (11/02 1220) Temp Source: Oral (11/02 1220) BP: 116/66 mmHg (11/02 1136) Pulse Rate: 83 (11/02 1136) Intake/Output from previous day: 11/01 0701 - 11/02 0700 In: 3339.3 [I.V.:830.5; NG/GT:1860; IV Piggyback:568.8] Out: 1075 [Urine:675; Stool:400] Intake/Output from this shift: Total I/O In: 690 [I.V.:120; NG/GT:470; IV Piggyback:100] Out: 295 [Urine:295]  Labs:  Recent Labs  02/06/14 0500 02/06/14 1700 02/07/14 0400 02/08/14 0443  WBC 4.2 8.0 11.5* 14.9*  HGB 6.8* 9.0* 8.3* 9.6*  PLT 241 276 289 364  CREATININE 2.74*  --  3.26* 3.51*   Estimated Creatinine Clearance: 17.7 mL/min (by C-G formula based on Cr of 3.51).   Assessment: 43yof with newly diagnosed HIV. Pt on Acyclovir (Day #11) for HSV encephalitis, Clinda/Primaquine (Day #4) for PCP, Azithromycin/Ethambutol/Rifabutin for MAC px. Currently afebrile. WBC trending down. ID on board. HIV + (new dx) - ART started today with Abacavir, Tivicay, and Epivir. Steroids added for PCP.  CD4 T cell = 10, CD4 % helper T cell 4 VL 251,358  Vanc 10/23>>10/28 Zosyn (asp pna) 10/23>>10/30 Acyclovir 10/24>> PO Fluc (thrush) 10/27>> PO Bactrim (PCP px) 10/26>>10/27 IV Bactrim (PCP tx) 10/27>>10/30 Ganciclovir (CMV/HSV) 10/29 x1 - in error, lab called Dr. Drue SecondSnider with +CMV pcr (but never resulted and lab couldn't run) - SZP called in by MD Clinda (PCP tx) 10/30>> Primaquine (PCP tx) 10/30>> Ethambutol (r/o MAC) 10/30>> Azith (MAC px) 10/26>>changed to MAC tx dose 10/30>> Rifabutin 10/30>>  Renal:  ARF, SCr trending up to 3.51 (previous peak 3.07 10/21), down to 0.5 ml/kg/hr, Net +14.6L. K 5.5- Kayexalate. Renal US-->b/l echogenic kidneys. Renal consult. ATN due to therapies vs HIV associated?  Goal of Therapy:  Appropriate antibiotic dosing Clinical resolution of infection  Plan:  Continue Fluconazole 100mg  PT q24h (thrush) Continue Acyclovir 470mg  IV q24h (wt now increased significantly since admission, net +14.6L; not adjust dosing up due to worsening renal function) Continue Clindamycin 600mg  IV q8h Continue Primaquine 30mg  per tube daily Continue Ethambutol 800mg  per tube daily Continue Rifabutin 150mg  per tube daily Continue Azithromycin 600mg  per tube daily F/u renal function, micro data  Link SnufferJessica Taiya Nutting, PharmD, BCPS Clinical Pharmacist [phone]  02/08/2014,2:58 PM

## 2014-02-08 NOTE — Plan of Care (Signed)
Problem: Phase I Progression Outcomes Goal: Progress activity as tolerated unless otherwise ordered Outcome: Not Applicable Date Met:  35/24/81 Goal: Discharge plan established Outcome: Not Met (add Reason) Goal: Tolerating diet Outcome: Not Applicable Date Met:  85/90/93

## 2014-02-09 DIAGNOSIS — R739 Hyperglycemia, unspecified: Secondary | ICD-10-CM

## 2014-02-09 DIAGNOSIS — D649 Anemia, unspecified: Secondary | ICD-10-CM

## 2014-02-09 DIAGNOSIS — R569 Unspecified convulsions: Secondary | ICD-10-CM

## 2014-02-09 DIAGNOSIS — J9601 Acute respiratory failure with hypoxia: Secondary | ICD-10-CM

## 2014-02-09 DIAGNOSIS — N179 Acute kidney failure, unspecified: Secondary | ICD-10-CM | POA: Insufficient documentation

## 2014-02-09 DIAGNOSIS — R000 Tachycardia, unspecified: Secondary | ICD-10-CM

## 2014-02-09 DIAGNOSIS — Z9911 Dependence on respirator [ventilator] status: Secondary | ICD-10-CM | POA: Insufficient documentation

## 2014-02-09 DIAGNOSIS — B1009 Other human herpesvirus encephalitis: Secondary | ICD-10-CM

## 2014-02-09 LAB — CBC
HCT: 23.6 % — ABNORMAL LOW (ref 36.0–46.0)
Hemoglobin: 8 g/dL — ABNORMAL LOW (ref 12.0–15.0)
MCH: 26 pg (ref 26.0–34.0)
MCHC: 33.9 g/dL (ref 30.0–36.0)
MCV: 76.6 fL — ABNORMAL LOW (ref 78.0–100.0)
Platelets: 290 10*3/uL (ref 150–400)
RBC: 3.08 MIL/uL — ABNORMAL LOW (ref 3.87–5.11)
RDW: 18.5 % — ABNORMAL HIGH (ref 11.5–15.5)
WBC: 8.3 10*3/uL (ref 4.0–10.5)

## 2014-02-09 LAB — C3 COMPLEMENT: C3 Complement: 102 mg/dL (ref 90–180)

## 2014-02-09 LAB — HIV-1 GENOTYPR PLUS

## 2014-02-09 LAB — FUNGUS CULTURE, BLOOD: CULTURE: NO GROWTH

## 2014-02-09 LAB — ANTI-DNA ANTIBODY, DOUBLE-STRANDED: ds DNA Ab: <1 IU/mL

## 2014-02-09 LAB — RENAL FUNCTION PANEL
Albumin: 1.6 g/dL — ABNORMAL LOW (ref 3.5–5.2)
Anion gap: 16 — ABNORMAL HIGH (ref 5–15)
BUN: 87 mg/dL — ABNORMAL HIGH (ref 6–23)
CO2: 17 mEq/L — ABNORMAL LOW (ref 19–32)
Calcium: 8.3 mg/dL — ABNORMAL LOW (ref 8.4–10.5)
Chloride: 106 mEq/L (ref 96–112)
Creatinine, Ser: 3.78 mg/dL — ABNORMAL HIGH (ref 0.50–1.10)
GFR calc Af Amer: 16 mL/min — ABNORMAL LOW (ref >90)
GFR calc non Af Amer: 14 mL/min — ABNORMAL LOW (ref >90)
Glucose, Bld: 221 mg/dL — ABNORMAL HIGH (ref 70–99)
Phosphorus: 7.1 mg/dL — ABNORMAL HIGH (ref 2.3–4.6)
Potassium: 5.1 mEq/L (ref 3.7–5.3)
Sodium: 139 mEq/L (ref 137–147)

## 2014-02-09 LAB — GLUCOSE, CAPILLARY
GLUCOSE-CAPILLARY: 145 mg/dL — AB (ref 70–99)
GLUCOSE-CAPILLARY: 154 mg/dL — AB (ref 70–99)
GLUCOSE-CAPILLARY: 188 mg/dL — AB (ref 70–99)
Glucose-Capillary: 196 mg/dL — ABNORMAL HIGH (ref 70–99)
Glucose-Capillary: 174 mg/dL — ABNORMAL HIGH (ref 70–99)
Glucose-Capillary: 205 mg/dL — ABNORMAL HIGH (ref 70–99)
Glucose-Capillary: 154 mg/dL — ABNORMAL HIGH (ref 70–99)

## 2014-02-09 LAB — C4 COMPLEMENT: Complement C4, Body Fluid: 16 mg/dL (ref 10–40)

## 2014-02-09 LAB — ANA: Anti Nuclear Antibody(ANA): NEGATIVE

## 2014-02-09 MED ORDER — LABETALOL HCL 100 MG PO TABS
100.0000 mg | ORAL_TABLET | Freq: Two times a day (BID) | ORAL | Status: DC
  Administered 2014-02-09 – 2014-02-11 (×4): 100 mg via ORAL
  Filled 2014-02-09 (×5): qty 1

## 2014-02-09 NOTE — Progress Notes (Signed)
Family unavailable to educate regarding isolation precautions

## 2014-02-09 NOTE — Progress Notes (Signed)
[facility]    Day 12Acyclovir DAy 5 IV clinda/primaquin (day 6 pcp tx) Day 5 emb/rifabutin/azithro  Zosyn D/cd after 7 days    Subjective: oN VENTILATOR   Antibiotics:  Anti-infectives    Start     Dose/Rate Route Frequency Ordered Stop   02/09/14 1000  lamiVUDine (EPIVIR) 10 MG/ML solution 100 mg     100 mg Oral Daily 02/08/14 1149     02/08/14 1600  azithromycin (ZITHROMAX) tablet 1,200 mg  Status:  Discontinued     1,200 mg Per Tube Weekly 02/03/14 0955 02/03/14 1012   02/08/14 1300  abacavir (ZIAGEN) tablet 600 mg     600 mg Per NG tube Daily 02/08/14 1115     02/08/14 1200  lamiVUDine (EPIVIR) 10 MG/ML solution 150 mg     150 mg Per Tube  Once 02/08/14 1115 02/08/14 1320   02/08/14 1200  dolutegravir (TIVICAY) tablet 50 mg    Comments:  Per NG TUBE   50 mg Oral 2 times daily 02/08/14 1115     02/08/14 1000  azithromycin (ZITHROMAX) 200 MG/5ML suspension 1,200 mg  Status:  Discontinued     1,200 mg Oral Weekly 02/03/14 1013 02/03/14 1015   02/08/14 1000  azithromycin (ZITHROMAX) 200 MG/5ML suspension 1,200 mg  Status:  Discontinued     1,200 mg Per Tube Weekly 02/03/14 1015 02/05/14 1542   02/06/14 1200  acyclovir (ZOVIRAX) 470 mg in dextrose 5 % 100 mL IVPB     10 mg/kg  47 kg109.4 mL/hr over 60 Minutes Intravenous Every 24 hours 02/06/14 1110     02/05/14 2200  clindamycin (CLEOCIN) IVPB 600 mg     600 mg100 mL/hr over 30 Minutes Intravenous 3 times per day 02/05/14 1542     02/05/14 2200  primaquine tablet 30 mg     30 mg Per Tube Every 24 hours 02/05/14 1542     02/05/14 1700  azithromycin (ZITHROMAX) 200 MG/5ML suspension 600 mg     600 mg Per Tube Daily 02/05/14 1542     02/05/14 1700  ethambutol (MYAMBUTOL) tablet 800 mg  Status:  Discontinued     800 mg Oral  Every 24 hours 02/05/14 1542 02/05/14 1545   02/05/14 1700  ethambutol (MYAMBUTOL) tablet 800 mg     800 mg Per Tube Every 24 hours 02/05/14 1545     02/05/14 1600  rifabutin (MYCOBUTIN) capsule 150 mg     150 mg Oral Daily 02/05/14 1542     02/05/14 1200  acyclovir (ZOVIRAX) 450 mg in dextrose 5 % 100 mL IVPB  Status:  Discontinued     10 mg/kg  45 kg109 mL/hr over 60 Minutes Intravenous Every 24 hours 02/04/14 1554 02/04/14 1601   02/05/14 1200  acyclovir (ZOVIRAX) 400 mg in dextrose 5 % 100 mL IVPB  Status:  Discontinued     10 mg/kg  40 kg (Order-Specific)108 mL/hr over 60 Minutes Intravenous Every 24 hours 02/04/14 1601 02/06/14 1110   02/05/14 1200  sulfamethoxazole-trimethoprim (BACTRIM) 240 mg of trimethoprim in dextrose 5 % 250 mL IVPB  Status:  Discontinued     240 mg of trimethoprim265 mL/hr over 60 Minutes Intravenous Every 12 hours 02/05/14 1156 02/05/14 1510   02/05/14 1000  fluconazole (DIFLUCAN) 40 MG/ML suspension 100 mg     100 mg Per Tube Daily 02/04/14 1759     02/04/14 2000  piperacillin-tazobactam (ZOSYN) IVPB 2.25 g     2.25 g100 mL/hr over  30 Minutes Intravenous Every 8 hours 02/04/14 1613 02/04/14 2121   02/04/14 1100  ganciclovir (CYTOVENE) 55 mg in sodium chloride 0.9 % 100 mL IVPB  Status:  Discontinued     1.25 mg/kg  45 kg100 mL/hr over 60 Minutes Intravenous Every 24 hours 02/04/14 1007 02/04/14 1450   02/04/14 1000  fluconazole (DIFLUCAN) tablet 400 mg  Status:  Discontinued     400 mg Per Tube Daily 02/03/14 0955 02/03/14 1015   02/04/14 1000  fluconazole (DIFLUCAN) 40 MG/ML suspension 400 mg  Status:  Discontinued     400 mg Per Tube Daily 02/03/14 1015 02/03/14 1136   02/04/14 1000  fluconazole (DIFLUCAN) 40 MG/ML suspension 200 mg  Status:  Discontinued     200 mg Per Tube Daily 02/03/14 1136 02/04/14 1759   02/04/14 1000  acyclovir (ZOVIRAX) 400 mg in dextrose 5 % 100 mL IVPB  Status:  Discontinued     10 mg/kg  40 kg108 mL/hr over 60 Minutes  Intravenous Every 24 hours 02/03/14 1412 02/04/14 0952   02/04/14 0000  sulfamethoxazole-trimethoprim (BACTRIM) 200 mg of trimethoprim in dextrose 5 % 250 mL IVPB  Status:  Discontinued     5 mg/kg of trimethoprim  40 kg262.5 mL/hr over 60 Minutes Intravenous Every 12 hours 02/03/14 1412 02/05/14 1156   02/03/14 2000  piperacillin-tazobactam (ZOSYN) IVPB 2.25 g  Status:  Discontinued     2.25 g100 mL/hr over 30 Minutes Intravenous Every 8 hours 02/03/14 1412 02/04/14 1613   02/02/14 1400  sulfamethoxazole-trimethoprim (BACTRIM) 200 mg in dextrose 5 % 250 mL IVPB  Status:  Discontinued     15 mg/kg/day  40 kg262.5 mL/hr over 60 Minutes Intravenous 3 times per day 02/02/14 1354 02/03/14 1412   02/02/14 1300  fluconazole (DIFLUCAN) tablet 400 mg  Status:  Discontinued     400 mg Oral Daily 02/02/14 1026 02/03/14 0955   02/01/14 1700  sulfamethoxazole-trimethoprim (BACTRIM DS) 800-160 MG per tablet 1 tablet  Status:  Discontinued     1 tablet Oral Once per day on Mon Wed Fri 02/01/14 1549 02/02/14 1316   02/01/14 1600  azithromycin (ZITHROMAX) tablet 1,200 mg  Status:  Discontinued     1,200 mg Oral Weekly 02/01/14 1515 02/03/14 0955   02/01/14 1000  acyclovir (ZOVIRAX) 420 mg in dextrose 5 % 100 mL IVPB  Status:  Discontinued     10 mg/kg  41.9 kg108.4 mL/hr over 60 Minutes Intravenous Every 12 hours 02/01/14 0909 02/03/14 1412   02/01/14 1000  vancomycin (VANCOCIN) IVPB 750 mg/150 ml premix  Status:  Discontinued     750 mg150 mL/hr over 60 Minutes Intravenous Every 24 hours 02/01/14 0910 02/03/14 0910   02/01/14 0823  vancomycin (VANCOCIN) 500 mg in sodium chloride 0.9 % 100 mL IVPB  Status:  Discontinued     500 mg100 mL/hr over 60 Minutes Intravenous Every 24 hours 01/31/14 1029 02/01/14 0909   01/31/14 1200  piperacillin-tazobactam (ZOSYN) IVPB 3.375 g  Status:  Discontinued     3.375 g12.5 mL/hr over 240 Minutes Intravenous Every 8 hours 01/31/14 1029 02/03/14 1412   01/30/14 2100   acyclovir (ZOVIRAX) 390 mg in dextrose 5 % 100 mL IVPB  Status:  Discontinued     10 mg/kg  39.1 kg107.8 mL/hr over 60 Minutes Intravenous Every 24 hours 01/30/14 2018 02/01/14 0908   01/29/14 1600  acyclovir (ZOVIRAX) 420 mg in dextrose 5 % 100 mL IVPB  Status:  Discontinued     420 mg108.4  mL/hr over 60 Minutes Intravenous Every 24 hours 01/29/14 1457 01/29/14 2015   01/29/14 0800  cefTRIAXone (ROCEPHIN) 1 g in dextrose 5 % 50 mL IVPB  Status:  Discontinued     1 g100 mL/hr over 30 Minutes Intravenous Every 24 hours 01/29/14 0707 01/29/14 0710   01/29/14 0800  vancomycin (VANCOCIN) 500 mg in sodium chloride 0.9 % 100 mL IVPB  Status:  Discontinued     500 mg100 mL/hr over 60 Minutes Intravenous Every 48 hours 01/29/14 0719 01/31/14 1029   01/29/14 0730  piperacillin-tazobactam (ZOSYN) IVPB 2.25 g  Status:  Discontinued     2.25 g100 mL/hr over 30 Minutes Intravenous 4 times per day 01/29/14 0719 01/31/14 1028   01/29/14 0715  azithromycin (ZITHROMAX) 500 mg in dextrose 5 % 250 mL IVPB  Status:  Discontinued     500 mg250 mL/hr over 60 Minutes Intravenous Every 24 hours 01/29/14 0704 01/29/14 0710      Medications: Scheduled Meds: . sodium chloride   Intravenous Once  . abacavir  600 mg Per NG tube Daily  . acyclovir  10 mg/kg Intravenous Q24H  . antiseptic oral rinse  7 mL Mouth Rinse QID  . aspirin  81 mg Oral Daily  . azithromycin  600 mg Per Tube Daily  . chlorhexidine  15 mL Mouth Rinse BID  . clindamycin (CLEOCIN) IV  600 mg Intravenous 3 times per day  . dolutegravir  50 mg Oral BID  . ethambutol  800 mg Per Tube Q24H  . fluconazole  100 mg Per Tube Daily  . heparin subcutaneous  5,000 Units Subcutaneous 3 times per day  . labetalol  100 mg Oral BID  . lamiVUDine  100 mg Oral Daily  . levETIRAcetam  500 mg Intravenous Q12H  . methylPREDNISolone (SOLU-MEDROL) injection  40 mg Intravenous Q12H  . pantoprazole sodium  40 mg Per Tube Daily  . primaquine  30 mg Per Tube Q24H  .  rifabutin  150 mg Oral Daily   Continuous Infusions: . dextrose 5 % 1,000 mL with sodium bicarbonate 150 mEq infusion 50 mL/hr at 02/08/14 1802  . feeding supplement (JEVITY 1.2 CAL) 60 mL/hr (02/09/14 0900)  . fentaNYL infusion INTRAVENOUS Stopped (02/09/14 0830)   PRN Meds:.acetaminophen (TYLENOL) oral liquid 160 mg/5 mL, fentaNYL, hydrALAZINE, metoprolol, ondansetron **OR** ondansetron (ZOFRAN) IV    Objective: Weight change: -1 lb 15.8 oz (-0.9 kg)  Intake/Output Summary (Last 24 hours) at 02/09/14 1535 Last data filed at 02/09/14 1400  Gross per 24 hour  Intake 3522.33 ml  Output   2630 ml  Net 892.33 ml   Blood pressure 143/84, pulse 74, temperature 98 F (36.7 C), temperature source Oral, resp. rate 24, height _0  (1.702 m), weight 119 lb 0.8 oz (54 kg), SpO2 100 %. Temp:  [97.1 F (36.2 C)-98 F (36.7 C)] 98 F (36.7 C) (11/03 1236) Pulse Rate:  [62-85] 74 (11/03 1514) Resp:  [24-27] 24 (11/03 1514) BP: (112-165)/(62-91) 143/84 mmHg (11/03 1514) SpO2:  [100 %] 100 % (11/03 1514) FiO2 (%):  [40 %] 40 % (11/03 1514) Weight:  [119 lb 0.8 oz (54 kg)] 119 lb 0.8 oz (54 kg) (11/03 0500)  Physical Exam: General: MUCH MORE AWAKE AND ALERT AND NODDING HEAD TO QUESTIONS, restrained on ventilator HEENT: EOMI CVS tachycardical r,  no murmur rubs or gallops Chest: coarse breath sounds bilaterally Abdomen: softnondistended, normal bowel sounds, Skin: no rashes Neuro: nonfocal  CBC:  CBC Latest Ref Rng 02/09/2014 02/08/2014 02/07/2014  WBC 4.0 - 10.5 K/uL 8.3 14.9(H) 11.5(H)  Hemoglobin 12.0 - 15.0 g/dL 8.0(L) 9.6(L) 8.3(L)  Hematocrit 36.0 - 46.0 % 23.6(L) 28.6(L) 25.0(L)  Platelets 150 - 400 K/uL 290 364 289     BMET  Recent Labs  02/08/14 1704 02/09/14 0400  NA 140 139  K 5.2 5.1  CL 109 106  CO2 14* 17*  GLUCOSE 148* 221*  BUN 85* 87*  CREATININE 3.79* 3.78*  CALCIUM 8.3* 8.3*     Liver Panel   Recent Labs  02/08/14 0443 02/08/14 1704  02/09/14 0400  PROT 6.4  --   --   ALBUMIN 1.7* 1.6* 1.6*  AST 25  --   --   ALT 28  --   --   ALKPHOS 82  --   --   BILITOT <0.2*  --   --        Sedimentation Rate No results for input(s): ESRSEDRATE in the last 72 hours. C-Reactive Protein No results for input(s): CRP in the last 72 hours.  Micro Results: Recent Results (from the past 720 hour(s))  Urine culture     Status: None   Collection Time: 01/27/14  3:26 PM  Result Value Ref Range Status   Specimen Description URINE, CATHETERIZED  Final   Special Requests NONE  Final   Culture  Setup Time   Final    01/27/2014 22:22 Performed at [place] Performed at [facility]  Final   Culture NO GROWTH Performed at [facility]  Final   Report Status 01/28/2014 FINAL  Final  Culture, blood (routine x 2)     Status: None   Collection Time: 01/29/14  9:19 AM  Result Value Ref Range Status   Specimen Description BLOOD LEFT ARM  Final   Special Requests BOTTLES DRAWN AEROBIC AND ANAEROBIC 10CC  Final   Culture  Setup Time   Final    01/29/2014 15:04 Performed at [place]   Final    NO GROWTH 5 DAYS Performed at [facility]   Report Status 02/04/2014 FINAL  Final  Culture, blood (routine x 2)     Status: None   Collection Time: 01/29/14  9:30 AM  Result Value Ref Range Status   Specimen Description BLOOD LEFT HAND  Final   Special Requests BOTTLES DRAWN AEROBIC AND ANAEROBIC 5CC  Final   Culture  Setup Time   Final    01/29/2014 15:04 Performed at [place]   Final    NO GROWTH 5 DAYS Performed at [facility]   Report Status 02/04/2014 FINAL  Final  Clostridium Difficile by PCR     Status: None   Collection Time: 01/29/14 11:10 AM  Result Value Ref Range Status   C difficile by pcr NEGATIVE NEGATIVE Final  MRSA PCR Screening     Status: Abnormal   Collection Time: 01/29/14  3:40 PM  Result Value Ref  Range Status   MRSA by PCR POSITIVE (A) NEGATIVE Final    Comment:        The GeneXpert MRSA Assay (FDA approved for NASAL specimens only), is one component of a comprehensive MRSA colonization surveillance program. It is not intended to diagnose MRSA infection nor to guide or monitor treatment for MRSA infections. RESULT CALLED TO, READ BACK BY AND VERIFIED WITH: Sherry Ruffing RN 17:50 01/29/14 ([email or web address])  AFB culture, blood  Status: None (Preliminary result)   Collection Time: 01/30/14 11:13 AM  Result Value Ref Range Status   Specimen Description BLOOD LEFT ARM  Final   Special Requests BOTTLES DRAWN AEROBIC ONLY [place]  Final   Culture   Final    CULTURE WILL BE EXAMINED FOR 6 WEEKS BEFORE ISSUING A FINAL REPORT Performed at [facility]   Report Status PENDING  Incomplete  GC/Chlamydia Probe Amp     Status: None   Collection Time: 01/30/14 11:30 AM  Result Value Ref Range Status   CT Probe RNA NEGATIVE NEGATIVE Final   GC Probe RNA NEGATIVE NEGATIVE Final    Comment: (NOTE)                                                                                       **Normal Reference Range: Negative**      Assay performed using the Gen-Probe APTIMA COMBO2 (R) Assay. Acceptable specimen types for this assay include APTIMA Swabs (Unisex, endocervical, urethral, or vaginal), first void urine, and ThinPrep liquid based cytology samples. Performed at [facility]  CSF culture     Status: None   Collection Time: 01/30/14  5:01 PM  Result Value Ref Range Status   Specimen Description CSF  Final   Special Requests NONE  Final   Gram Stain   Final    CYTOSPIN SLIDE WBC PRESENT, PREDOMINANTLY MONONUCLEAR NO ORGANISMS SEEN Performed at [facility] Performed at [facility]   Culture   Final    NO GROWTH 3 DAYS Performed at [facility]   Report Status 02/03/2014 FINAL  Final  Gram stain     Status: None   Collection Time: 01/30/14  5:01 PM   Result Value Ref Range Status   Specimen Description CSF  Final   Special Requests NONE  Final   Gram Stain   Final    CYTOSPIN SLIDE WBC PRESENT, PREDOMINANTLY MONONUCLEAR NO ORGANISMS SEEN   Report Status 01/30/2014 FINAL  Final  Fungus culture, blood     Status: None   Collection Time: 02/01/14  2:00 PM  Result Value Ref Range Status   Specimen Description BLOOD LEFT HAND  Final   Special Requests BOTTLES DRAWN AEROBIC AND ANAEROBIC 10CC  Final   Culture   Final    NO GROWTH 7 DAYS Performed at [facility]    Report Status 02/09/2014 FINAL  Final  Culture, blood (routine x 2)     Status: None   Collection Time: 02/02/14 12:07 AM  Result Value Ref Range Status   Specimen Description BLOOD RIGHT HAND  Final   Special Requests BOTTLES DRAWN AEROBIC ONLY [facility]  Final   Culture  Setup Time   Final    02/02/2014 03:55 Performed at [place]   Final    NO GROWTH 5 DAYS Performed at [facility]    Report Status 02/08/2014 FINAL  Final  Culture, blood (routine x 2)     Status: None   Collection Time: 02/02/14 12:18 AM  Result Value Ref Range Status   Specimen Description BLOOD LEFT HAND  Final   Special Requests BOTTLES DRAWN AEROBIC AND ANAEROBIC [facility] EACH  Final   Culture  Setup Time   Final    02/02/2014 03:55 Performed at [place]   Final    NO GROWTH 5 DAYS Performed at [facility]    Report Status 02/08/2014 FINAL  Final  Pneumocystis smear by DFA     Status: None   Collection Time: 02/03/14  2:30 PM  Result Value Ref Range Status   Specimen Source-PJSRC SPUTUM  Final   Pneumocystis jiroveci Ag POSITIVE  Final    Comment: Performed at [place] RN 02/04/14 1444 BY [place]  Culture, respiratory (NON-Expectorated)     Status: None   Collection Time: 02/04/14  3:50 AM  Result Value Ref Range Status   Specimen Description TRACHEAL ASPIRATE  Final    Special Requests Immunocompromised  Final   Gram Stain   Final    FEW WBC PRESENT,BOTH PMN AND MONONUCLEAR RARE SQUAMOUS EPITHELIAL CELLS PRESENT NO ORGANISMS SEEN Performed at [facility]   Culture   Final    NO GROWTH 2 DAYS Performed at [facility]   Report Status 02/06/2014 FINAL  Final  AFB culture with smear     Status: None (Preliminary result)   Collection Time: 02/04/14  3:50 AM  Result Value Ref Range Status   Specimen Description TRACHEAL ASPIRATE  Final   Special Requests NONE  Final   Acid Fast Smear   Final    1+ ACID FAST BACILLI SEEN CRITICAL RESULT CALLED TO, READ BACK BY AND VERIFIED WITH: CIARFI 14:55 110/30/15 FUENG CRITICAL RESULT CALLED TO, READ BACK BY AND VERIFIED WITH: LEE ANN STIMPSON H.D. 10:30 02/08/14 FUENG Performed at [facility]    Culture   Final    CULTURE WILL BE EXAMINED FOR 6 WEEKS BEFORE ISSUING A FINAL REPORT Performed at [facility]    Report Status PENDING  Incomplete  Fungus Culture with Smear     Status: None (Preliminary result)   Collection Time: 02/04/14  3:50 AM  Result Value Ref Range Status   Specimen Description TRACHEAL ASPIRATE  Final   Special Requests NONE  Final   Fungal Smear   Final    NO YEAST OR FUNGAL ELEMENTS SEEN Performed at [facility]    Culture   Final    CANDIDA ALBICANS Performed at [facility]    Report Status PENDING  Incomplete  Respiratory virus panel     Status: None   Collection Time: 02/04/14  3:50 AM  Result Value Ref Range Status   Source - RVPAN TRACHEAL ASPIRATE  Final   Respiratory Syncytial Virus A NOT DETECTED  Final   Respiratory Syncytial Virus B NOT DETECTED  Final   Influenza A NOT DETECTED  Final   Influenza B NOT DETECTED  Final   Parainfluenza 1 NOT DETECTED  Final   Parainfluenza 2 NOT DETECTED  Final   Parainfluenza 3 NOT DETECTED  Final   Metapneumovirus NOT DETECTED  Final   Rhinovirus NOT DETECTED  Final   Adenovirus NOT  DETECTED  Final   Influenza A H1 NOT DETECTED  Final   Influenza A H3 NOT DETECTED  Final    Comment: (NOTE)       Normal Reference Range for each Analyte: NOT DETECTED Testing performed using the Luminex xTAG Respiratory Viral Panel test kit. The analytical performance  characteristics of this assay have been determined by [facility].  The modifications have not been cleared or approved by the FDA. This assay has been validated pursuant to the CLIA regulations and is used for clinical purposes. Performed at [facility], bal-quantitative     Status: None   Collection Time: 02/04/14 10:31 AM  Result Value Ref Range Status   Specimen Description BRONCHIAL ALVEOLAR LAVAGE  Final   Special Requests Immunocompromised  Final   Gram Stain   Final    FEW WBC PRESENT, PREDOMINANTLY MONONUCLEAR RARE SQUAMOUS EPITHELIAL CELLS PRESENT NO ORGANISMS SEEN Performed at [place] Performed at [facility]  Final   Culture   Final    NO GROWTH 2 DAYS Performed at [facility]   Report Status 02/06/2014 FINAL  Final  AFB culture with smear     Status: None (Preliminary result)   Collection Time: 02/06/14  6:14 PM  Result Value Ref Range Status   Specimen Description SPUTUM  Final   Special Requests NONE  Final   Acid Fast Smear   Final    1+ ACID FAST BACILLI SEEN REFER TO PREVIOUS CULTURE [id number] FOR CALLS Performed at [facility]    Culture   Final    CULTURE WILL BE EXAMINED FOR 6 WEEKS BEFORE ISSUING A FINAL REPORT Performed at [facility]    Report Status PENDING  Incomplete  Culture, blood (routine x 2)     Status: None (Preliminary result)   Collection Time: 02/07/14  6:26 PM  Result Value Ref Range Status   Specimen Description BLOOD LEFT ARM  Final   Special Requests BOTTLES DRAWN AEROBIC AND ANAEROBIC 10CC  Final   Culture  Setup Time   Final    02/08/2014 02:25 Performed at  [facility]    Culture   Final           BLOOD CULTURE RECEIVED NO GROWTH TO DATE CULTURE WILL BE HELD FOR 5 DAYS BEFORE ISSUING A FINAL NEGATIVE REPORT Performed at [facility]    Report Status PENDING  Incomplete  Culture, blood (routine x 2)     Status: None (Preliminary result)   Collection Time: 02/07/14  6:32 PM  Result Value Ref Range Status   Specimen Description BLOOD BLOOD LEFT FOREARM  Final   Special Requests BOTTLES DRAWN AEROBIC ONLY 10CC  Final   Culture  Setup Time   Final    02/08/2014 02:25 Performed at [facility]    Culture   Final           BLOOD CULTURE RECEIVED NO GROWTH TO DATE CULTURE WILL BE HELD FOR 5 DAYS BEFORE ISSUING A FINAL NEGATIVE REPORT Performed at [facility]    Report Status PENDING  Incomplete    Studies/Results: Dg Chest Port 1 View  02/08/2014   CLINICAL DATA:  Respiratory failure.  EXAM: PORTABLE CHEST - 1 VIEW  COMPARISON:  02/07/2014.  FINDINGS: Tracheostomy to, feeding tube, left IJ line in stable position. Mediastinum and hilar structures are unremarkable. Heart size stable. Mild right lower lobe infiltrate cannot be excluded. No pleural effusion or pneumothorax. Left costophrenic angle not imaged.  IMPRESSION: 1. Line and tubes in stable position. 2. Mild infiltrate right lower lobe cannot be excluded.   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  --continue acyclovir and aim for 21 days  #2 PCP Pneumonia: continue clindamycin Primaquine and steroids for 21 day course  #4 Renal failure :started ARV, renal failure likely multifactorial. Cr stabilizing possibly  #4 Fevers, diarrhea: empiric M avium drugs started, CMV colitis possible as well CMV PCR negative   #5 HIV/AIDS: started her on ARV regimen  Abacavir liquid, Epivir liquid and Tivicay per tube  NOTE HER ENCEPHALOPATHY COULD BE IN PART DUE TO UNCONTROLLED HIV VIREMIA AND DIRECT CNS TOXICITY OF HIV VIRUS. 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 WE SHOULD BE ABLE TO BRING HER VIRUS UNDER CONTROL IN A FEW WEEKS. TO ME SHE SEEMS IMPROVED NEUROLOGICALLY, THOUGH I APPRCECIATE THAT SHE IS FAILING WEANING TRIALS     LOS: 14 days   Alcide Evener 02/09/2014, 3:35 PM

## 2014-02-09 NOTE — Progress Notes (Signed)
Thank you for consulting the Palliative Medicine Team at [facility] to meet your patient's and family's needs.   The reason that you asked us to see your patient is  For Clarification of GOC and options  I have attempted to contact the husband and did leave a message for a return call, but have not heard back.  I spoke to the SW MarkhamBashira regarding a  holistic approach to call.  Lorinda CreedMary Larach NP  Palliative Medicine Team Team Phone # [phone] Pager [phone]

## 2014-02-09 NOTE — Progress Notes (Addendum)
Inpatient Diabetes Program Recommendations  AACE/ADA: New Consensus Statement on Inpatient Glycemic Control (2013)  Target Ranges:  Prepandial:   less than 140 mg/dL      Peak postprandial:   less than 180 mg/dL (1-2 hours)      Critically ill patients:  140 - 180 mg/dL   Results for [name], Tarissa (MRN [id number]) as of 02/09/2014 09:47  Ref. Range 02/08/2014 08:17 02/08/2014 12:18 02/08/2014 15:46 02/08/2014 19:20 02/09/2014 00:16 02/09/2014 03:54 02/09/2014 08:22  Glucose-Capillary Latest Range: 70-99 mg/dL 865122 (H) 784175 (H) 696141 (H) 159 (H) 188 (H) 196 (H) 174 (H)   Diabetes history: No Outpatient Diabetes medications: NA Current orders for Inpatient glycemic control: None  Inpatient Diabetes Program Recommendations Correction (SSI): Patient has no history of diabetes. However she is order steroids which are contributing to hyperglycemia. Please consider ordering Novolog correction scale.  Thanks, Orlando PennerMarie Shara Hartis, RN, MSN, CCRN, CDE Diabetes Coordinator Inpatient Diabetes Program [phone] (Team Pager) [phone] (AP office) [phone] [facility] office)

## 2014-02-09 NOTE — Progress Notes (Signed)
eLink Physician-Brief Progress Note Patient Name: Desiree BeamsKristy Hale DOB: [birth year] MRN: [id number]   Date of Service  02/09/2014  HPI/Events of Note  satn 100%  eICU Interventions  Drop PEEP to 5     Intervention Category Intermediate Interventions: Other:  [name] V. 02/09/2014, 4:09 PM

## 2014-02-09 NOTE — Progress Notes (Signed)
Patient ID: Hale Hale, female    DOB: [birth year], 43 y.o.   MRN: [id number] Nephrology Progress Note  S:Patient laying in bed, intubated with rectal tube and foley in place.  O:BP 130/77 mmHg  Pulse 62  Temp(Src) 97.4 F (36.3 C) (Oral)  Resp 24  Ht 5\' 7"  (1.702 m)  Wt 119 lb 0.8 oz (54 kg)  BMI 18.64 kg/m2  SpO2 100%  LMP   Intake/Output Summary (Last 24 hours) at 02/09/14 0815 Last data filed at 02/09/14 0700  Gross per 24 hour  Intake 3517.73 ml  Output   2540 ml  Net 977.73 ml   Intake/Output: I/O last 3 completed shifts: In: 4747.7 [I.V.:1408.3; NG/GT:2780; IV Piggyback:559.4] Out: 3290 [Urine:1190; Stool:2100]  Intake/Output this shift:    Weight change: -1 lb 15.8 oz (-0.9 kg)  Gen: Laying in bed, intubated, initially unresponsive but became alert during exam HEENT: Left IJ in place. No JVP CVS: Regular rate and rhythm Resp: Clear on anterior auscultation Abd: Soft Ext: No edema   Recent Labs Lab 02/04/14 1500 02/05/14 0226 02/06/14 0500 02/07/14 0400 02/08/14 0443 02/08/14 1704 02/09/14 0400  NA 136* 133* 135* 137 139 140 139  K 3.9 3.9 4.2 4.8 5.5* 5.2 5.1  CL 103 102 104 106 107 109 106  CO2 15* 14* 15* 16* 13* 14* 17*  GLUCOSE 123* 149* 196* 194* 151* 148* 221*  BUN 40* 43* 49* 62* 76* 85* 87*  CREATININE 2.65* 2.83* 2.74* 3.26* 3.51* 3.79* 3.78*  ALBUMIN  --  1.4* 1.3*  --  1.7* 1.6* 1.6*  CALCIUM 8.3* 8.1* 7.7* 8.1* 8.5 8.3* 8.3*  PHOS  --  2.8 4.6 4.9* 5.5* 6.9* 7.1*  AST  --  12 <5  --  25  --   --   ALT  --  <5 <5  --  28  --   --    Liver Function Tests:  Recent Labs Lab 02/05/14 0226 02/06/14 0500 02/08/14 0443 02/08/14 1704 02/09/14 0400  AST 12 <5 25  --   --   ALT <5 <5 28  --   --   ALKPHOS 87 77 82  --   --   BILITOT 0.3 <0.2* <0.2*  --   --   PROT 5.8* 5.6* 6.4  --   --   ALBUMIN 1.4* 1.3* 1.7* 1.6* 1.6*   No results for input(s): LIPASE, AMYLASE in the last 168 hours. No results for input(s): AMMONIA in the last  168 hours. CBC:  Recent Labs Lab 02/06/14 0500 02/06/14 1700 02/07/14 0400 02/08/14 0443 02/09/14 0400  WBC 4.2 8.0 11.5* 14.9* 8.3  NEUTROABS  --   --  10.9*  --   --   HGB 6.8* 9.0* 8.3* 9.6* 8.0*  HCT 21.0* 27.0* 25.0* 28.6* 23.6*  MCV 77.5* 78.7 76.7* 75.7* 76.6*  PLT 241 276 289 364 290   Cardiac Enzymes: No results for input(s): CKTOTAL, CKMB, CKMBINDEX, TROPONINI in the last 168 hours. CBG:  Recent Labs Lab 02/08/14 1218 02/08/14 1546 02/08/14 1920 02/09/14 0016 02/09/14 0354  GLUCAP 175* 141* 159* 188* 196*    Iron Studies: No results for input(s): IRON, TIBC, TRANSFERRIN, FERRITIN in the last 72 hours. Studies/Results: Dg Chest Port 1 View  02/08/2014   CLINICAL DATA:  Respiratory failure.  EXAM: PORTABLE CHEST - 1 VIEW  COMPARISON:  02/07/2014.  FINDINGS: Tracheostomy to, feeding tube, left IJ line in stable position. Mediastinum and hilar structures are unremarkable. Heart size stable. Mild right  lower lobe infiltrate cannot be excluded. No pleural effusion or pneumothorax. Left costophrenic angle not imaged.  IMPRESSION: 1. Line and tubes in stable position. 2. Mild infiltrate right lower lobe cannot be excluded.   Electronically Signed   By: Maisie Fushomas  Register   On: 02/08/2014 07:18   Medications: . sodium chloride   Intravenous Once  . abacavir  600 mg Per NG tube Daily  . acyclovir  10 mg/kg Intravenous Q24H  . antiseptic oral rinse  7 mL Mouth Rinse QID  . aspirin  81 mg Oral Daily  . azithromycin  600 mg Per Tube Daily  . chlorhexidine  15 mL Mouth Rinse BID  . clindamycin (CLEOCIN) IV  600 mg Intravenous 3 times per day  . dolutegravir  50 mg Oral BID  . ethambutol  800 mg Per Tube Q24H  . fluconazole  100 mg Per Tube Daily  . heparin subcutaneous  5,000 Units Subcutaneous 3 times per day  . labetalol  100 mg Oral BID  . lamiVUDine  100 mg Oral Daily  . levETIRAcetam  500 mg Intravenous Q12H  . methylPREDNISolone (SOLU-MEDROL) injection  40 mg  Intravenous Q12H  . pantoprazole sodium  40 mg Per Tube Daily  . primaquine  30 mg Per Tube Q24H  . rifabutin  150 mg Oral Daily    BMET    Component Value Date/Time   NA 139 02/09/2014 0400   K 5.1 02/09/2014 0400   CL 106 02/09/2014 0400   CO2 17* 02/09/2014 0400   GLUCOSE 221* 02/09/2014 0400   BUN 87* 02/09/2014 0400   CREATININE 3.78* 02/09/2014 0400   CALCIUM 8.3* 02/09/2014 0400   GFRNONAA 14* 02/09/2014 0400   GFRAA 16* 02/09/2014 0400   CBC    Component Value Date/Time   WBC 8.3 02/09/2014 0400   RBC 3.08* 02/09/2014 0400   HGB 8.0* 02/09/2014 0400   HCT 23.6* 02/09/2014 0400   PLT 290 02/09/2014 0400   MCV 76.6* 02/09/2014 0400   MCH 26.0 02/09/2014 0400   MCHC 33.9 02/09/2014 0400   RDW 18.5* 02/09/2014 0400   LYMPHSABS 0.2* 02/07/2014 0400   MONOABS 0.3 02/07/2014 0400   EOSABS 0.0 02/07/2014 0400   BASOSABS 0.0 02/07/2014 0400  Results for Hale Hale (MRN [id number]) as of 02/09/2014 12:21  Ref. 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 She was admitted with an initial creatinine of 3.07 with a transient improvement to 1.45, with progressive worsening since 10/29 and is now up to 3.78. She has been exposed to many antiinfectives (some that are nephrotoxic agent)s in addition to having newly diagnosed HIV/AIDs that is most likely a contributing factor.  Assessment/Plan: 1. AKI with metabolic acidosis: Appears to be multifactorial as patient arrived with kidney injury but has also received many nephrotoxic agents . Concern patient may have HIVAN vs HIVICK as a background cause for CKD with possible superimposedAIN vs ATN. Relative hypotension (big swings in systolic BP) could also  be playing a part in patient's worsening kidney function (renal ischemia) . Patient's potassium stable at 5.1 and phosphorus elevated to 7.1 from 5.5 yesterday; will not start phosphate binders since patient has normal corrected calcium and is intubated (would need calcium based binder and corrected Ca  normal). Patient has proteinuria with granular casts on urinalysis. UPC 550 mg (non-nephrotic) Renal ultrasound negative shows echodense kidneys. Will continue isotonic sodium bicarbonate at 50 cc/hour and continue to monitor labs. ANA pending. C3 and C4 wnl. Urinalysis wnl with elevated protein/creatinine of 0.55. 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 If patient continues, will most likely require HD. Unstable for renal biopsy, which will be needed to confirm pathology. 2. Hypertension: patient currently on labetalol. 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 HIV/AIDs: recently diagnosed. Unsure if this is a known diagnosis by patient. HIV therapy started 11/2. Will need to be cautious as to not cause further kidney injury. CMV pending. Sputum culture pending (AFB positive) 5. PCP pneumonia: management per primary and infectious disease 6. Diabetes mellitus: listed in history, although no documented medications  Jacquelin Hawking, MD PGY-2, [facility] 02/09/2014, 9:29 AM   I have seen and examined this patient and agree with plan and assessment of Dr. Caleb Popp with highlighted additions.  Not much change in renal function past 24 hours, remains non-oliguric, acid-base improved some on isotonic sodium bicarbonate.  No new recommendations or thoughts (see original consult note for full discussion).  Matisha Termine B,MD 02/09/2014 12:22 PM

## 2014-02-09 NOTE — Clinical Social Work Note (Signed)
Clinical Social Worker attempted to contact and left a message for patient's husband, Desiree Hale [phone] in reference to Palliative Care consult. CSW will continue to actively contact pt's husband.   [name], MSW, LCSWA [phone] 02/09/2014 12:17 PM

## 2014-02-09 NOTE — Progress Notes (Signed)
PULMONARY / CRITICAL CARE MEDICINE   Name: Desiree Hale MRN: [id number] DOB: 1970-10-10    ADMISSION DATE:  01/27/2014 CONSULTATION DATE:  02/03/14  REFERRING MD :  [facility]   CHIEF COMPLAINT:  AMS, dyspnea  INITIAL PRESENTATION: 43 y/o female with hx HTN, admitted 10/21 with AMS/?seizures, diagnosed as HSV encephalitis as well as PNA (?aspiration) and new dx HIV.  Followed by neurology and ID and rx with acyclovir, PCP rx.  On 10/28 developed worsening fevers, tachypnea/resp failure and PCCM consulted for tx ICU.  Further w/u showed HIV pos with CD4 =10 (new diagnosis).    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  Otherwise neg acute.  10/22  EEG  >>> normal drowsy and asleep electroencephalogram. No epileptiform activity is noted.  10/22  ECHO >>> EF 55-60%, grade 1 diastolic dysfunction, no RWMA, small free-flowing pericardial effusion 10/28  Worsening tachypnea, AMS, tx ICU.  Required intubation  10/30  Renal US >> kidneys diffusely echogenic, concerning for medical renal disease 10/31  Remains on full support, fent gtt, received 1 unit PRBC's  11/01  More awake, anemia improved 11/02 CXR Mild infiltrate right lower lobe cannot be excluded  11/02 ID started ARV therapy . Renal - broad ddx for ATN and unstable for biopsy  SUBJECTIVE:    02/09/14: opening eyes and following commands.      VITAL SIGNS: Temp:  [97.1 F (36.2 C)-98.9 F (37.2 C)] 97.4 F (36.3 C) (11/03 0356) Pulse Rate:  [62-126] 62 (11/03 0700) Resp:  [22-32] 24 (11/03 0700) BP: (104-164)/(59-91) 130/77 mmHg (11/03 0700) SpO2:  [99 %-100 %] 100 % (11/03 0700) FiO2 (%):  [40 %] 40 % (11/03 0400) Weight:  [119 lb 0.8 oz (54 kg)] 119 lb 0.8 oz (54 kg) (11/03 0500)  HEMODYNAMICS: CVP:  [2 mmHg-3 mmHg] 2 mmHg  VENTILATOR SETTINGS: Vent Mode:  [-] PRVC FiO2 (%):  [40 %] 40 % Set Rate:  [24 bmp] 24  bmp Vt Set:  [500 mL] 500 mL PEEP:  [8 cmH20] 8 cmH20 Plateau Pressure:  [18 cmH20-22 cmH20] 18 cmH20  INTAKE / OUTPUT:  Intake/Output Summary (Last 24 hours) at 02/09/14 0736 Last data filed at 02/09/14 0700  Gross per 24 hour  Intake 3617.73 ml  Output   2790 ml  Net 827.73 ml    PHYSICAL EXAMINATION: General:  Frail, cachectic, chronically ill appearing female Neuro:  rass -1,not moving ext. Opened eyes to voice HEENT: OETT, no jvd Cardiovascular:  s1s2 rrr,  Lungs: resp's even/non-labored, lungs coarse bilaterally  Abdomen:  Soft, +bs Musculoskeletal:  Warm and dry, no edema, foot drops boots in place  LABS:  PULMONARY  Recent Labs Lab 02/04/14 02/04/14 0207 02/06/14 0350  PHART 7.159* 7.323* 7.313*  PCO2ART 51.1* 31.3* 29.6*  PO2ART 140.0* 57.9* 241.0*  HCO3 17.0* 15.7* 14.7*  TCO2 18.5 16.7 15.6  O2SAT 97.5 87.2 99.6    CBC  Recent Labs Lab 02/07/14 0400 02/08/14 0443 02/09/14 0400  HGB 8.3* 9.6* 8.0*  HCT 25.0* 28.6* 23.6*  WBC 11.5* 14.9* 8.3  PLT 289 364 290    COAGULATION No results for input(s): INR in the last 168 hours.  CARDIAC  No results for input(s): TROPONINI in the last 168 hours. No results for input(s): PROBNP in the last 168 hours.   CHEMISTRY  Recent Labs Lab 02/05/14 0226 02/06/14 0500 02/07/14 0400 02/08/14 0443 02/08/14  1704 02/09/14 0400  NA 133* 135* 137 139 140 139  K 3.9 4.2 4.8 5.5* 5.2 5.1  CL 102 104 106 107 109 106  CO2 14* 15* 16* 13* 14* 17*  GLUCOSE 149* 196* 194* 151* 148* 221*  BUN 43* 49* 62* 76* 85* 87*  CREATININE 2.83* 2.74* 3.26* 3.51* 3.79* 3.78*  CALCIUM 8.1* 7.7* 8.1* 8.5 8.3* 8.3*  MG 1.7 2.3  --   --   --   --   PHOS 2.8 4.6 4.9* 5.5* 6.9* 7.1*   Estimated Creatinine Clearance: 16.2 mL/min (by C-G formula based on Cr of 3.78).   LIVER  Recent Labs Lab 02/05/14 0226 02/06/14 0500 02/08/14 0443 02/08/14 1704 02/09/14 0400  AST 12 <5 25  --   --   ALT <5 <5 28  --   --    ALKPHOS 87 77 82  --   --   BILITOT 0.3 <0.2* <0.2*  --   --   PROT 5.8* 5.6* 6.4  --   --   ALBUMIN 1.4* 1.3* 1.7* 1.6* 1.6*     INFECTIOUS  Recent Labs Lab 02/07/14 0358  LATICACIDVEN 1.2     ENDOCRINE CBG (last 3)   Recent Labs  02/08/14 1920 02/09/14 0016 02/09/14 0354  GLUCAP 159* 188* 196*         IMAGING x48h Dg Chest Port 1 View  02/08/2014   CLINICAL DATA:  Respiratory failure.  EXAM: PORTABLE CHEST - 1 VIEW  COMPARISON:  02/07/2014.  FINDINGS: Tracheostomy to, feeding tube, left IJ line in stable position. Mediastinum and hilar structures are unremarkable. Heart size stable. Mild right lower lobe infiltrate cannot be excluded. No pleural effusion or pneumothorax. Left costophrenic angle not imaged.  IMPRESSION: 1. Line and tubes in stable position. 2. Mild infiltrate right lower lobe cannot be excluded.   Electronically Signed   By: Maisie Fushomas  Register   On: 02/08/2014 07:18       ASSESSMENT / PLAN:  PULMONARY ETT 02/03/14 >> Acute Hypoxic Resp Failure  - Mild pulmonary infiltrates - PCP  - S/P Bronchoscopy - see ID  02/09/14: 40% fio2, failure to wean due to severe deconditioning and cachexia and encephalopathy   P:   Full support, PRVC 500/24/8/40% PRN ABG Wean PEEP/FiO2 for sats > 92% Trend CXR SBT / WUA daily  Steroids for PCP  NEUROLOGIC CMV >> 10 EBV >> less than 200  CSF JCV >> not detected  A: Acute encephalopathy - in setting of HSV encephalitis HSV encephalitis    - openeing eyes to command 02/09/14 P:   RASS goal: 0 Fentanyl drip, monitor for asynchrony  Neurology signed off  Cont keppra  Acyclovir as below  CSF studies as above    CARDIOVASCULAR HTN  Tachycardia - Resolved  SIRS  Elevated troponins - suspect demand ischemia. Resolved   Diastolic dysfunction - EF 55-60% Small Pericardial Effusion - free flowing, noted on ECHO 10/22   - maintaining bp/hr on 02/09/14  P:  Rx fever  PRN lopressor to keep HR  <130 PRN hydralazine to keep SBP <17/0 ASA Hold labetalol SBP <130  RENAL ANA  C3 >103 C4 >16 Lactic acid 1.1>1.2  AKI / ARF - bactrim stopped 10/30, renal US negative 10/30, UA with granular casts c/w medical renal dz.   Compensated AG Metabolic Acidosis - HIVAN vs HIVCK Hyperkalemia: resolved   - creatinine stable 02/09/14  P:   Trend BMP Monitor I/Os renal consult initiated Bicarb; serologies ordered; may  require HD at some point    GASTROINTESTINAL Protein calorie malnutrition  Diarrhea - Improving. CDiff neg, Giardia and cryptosporidium negative  P:  Nutrition following  -TFs PPI C. Diff 11/01>> Trend LFT's with acyclovir, WNL 11/02   HEMATOLOGIC DVT prevention Anemia - tx 10/31 x 1 P:  SQ heparin Trend CBC  FOB stool    INFECTIOUS BCx2 10/23>>>neg CSF culture 10/23>>> neg UC 10/21>>>neg  BAL 10/29>> neg Sputum 10/29 >> neg Sputum PCP DFA 10/28 >> PCP pos Sputum AFB cx w/ smear 10/29>>+1 AFB  BCx2 10/27>>> Sputum AFB 10/29 >> prelim neg >> Sputum Fungal 10/29 >> prelim neg >> BAL resp viral panel 10/29 >> Sputum AFB cx w/ smear 10/31>>+1 AFB  M. TB PCR 10/31 >>   A  HIV/AIDS - new dx HSV encephalitis  Persistent Fever - afebrile 24 hours.  PCP PNA TB vs MAC  Oral thrush  - afebrile the past 48 hours 02/09/14  P:    ARV's per ID  Acyclovir 10/26>>> Zosyn 10/25>>>10/29 Bactrim 10/27>>>10/30 Azithro (weekly, MAC prophylaxis) 10/27>>> Diflucan 10/27>>> Clinda 10/30 >>> Ethambutol 10/30 >>> Rifabutin 10/30 >>>  Antimicrobial therapies per ID Steroids IV for PCP  Repeat sputum for AFB & TB PCR 10/31  ENDOCRINE No active issue  P:   High risk hypoglycemia  D5NS at 650ml/hr  FAMILY:   Updates: No family at bedside 02/09/14 INterdisciplinary family meet :  13 days LOS. Palliative care consult called.     Today's Summary:  43 y/o F with new diagnosed HIV, PCP PNA, TB positive sputum (work up for TB vs MAC in progress), course  complicated by poor weaning efforts, anemia and renal failure.  Continue current care for now. Palliative consulted on 02/08/14.     Myra RudeJeremy E Zyanne Schumm, MD PGY-2, [facility] 02/09/2014 7:36 AM

## 2014-02-10 ENCOUNTER — Inpatient Hospital Stay (HOSPITAL_COMMUNITY): Payer: Medicaid Other

## 2014-02-10 LAB — QUANTIFERON TB GOLD ASSAY (BLOOD)
Mitogen value: 0.02 IU/mL
Quantiferon Nil Value: 0.01 IU/mL
TB ANTIGEN MINUS NIL VALUE: 0 [IU]/mL
TB Ag value: 0.01 IU/mL

## 2014-02-10 LAB — CBC
HEMATOCRIT: 26.6 % — AB (ref 36.0–46.0)
Hemoglobin: 8.8 g/dL — ABNORMAL LOW (ref 12.0–15.0)
MCH: 25.3 pg — AB (ref 26.0–34.0)
MCHC: 33.1 g/dL (ref 30.0–36.0)
MCV: 76.4 fL — ABNORMAL LOW (ref 78.0–100.0)
Platelets: 338 10*3/uL (ref 150–400)
RBC: 3.48 MIL/uL — ABNORMAL LOW (ref 3.87–5.11)
RDW: 18.3 % — ABNORMAL HIGH (ref 11.5–15.5)
WBC: 9.6 10*3/uL (ref 4.0–10.5)

## 2014-02-10 LAB — BASIC METABOLIC PANEL
Anion gap: 14 (ref 5–15)
BUN: 91 mg/dL — ABNORMAL HIGH (ref 6–23)
CALCIUM: 8.7 mg/dL (ref 8.4–10.5)
CHLORIDE: 107 meq/L (ref 96–112)
CO2: 22 meq/L (ref 19–32)
CREATININE: 3.57 mg/dL — AB (ref 0.50–1.10)
GFR calc Af Amer: 17 mL/min — ABNORMAL LOW (ref >90)
GFR calc non Af Amer: 14 mL/min — ABNORMAL LOW (ref >90)
GLUCOSE: 128 mg/dL — AB (ref 70–99)
Potassium: 5.6 mEq/L — ABNORMAL HIGH (ref 3.7–5.3)
SODIUM: 143 meq/L (ref 137–147)

## 2014-02-10 LAB — GLUCOSE, CAPILLARY
Glucose-Capillary: 125 mg/dL — ABNORMAL HIGH (ref 70–99)
Glucose-Capillary: 134 mg/dL — ABNORMAL HIGH (ref 70–99)
Glucose-Capillary: 135 mg/dL — ABNORMAL HIGH (ref 70–99)
Glucose-Capillary: 165 mg/dL — ABNORMAL HIGH (ref 70–99)
Glucose-Capillary: 167 mg/dL — ABNORMAL HIGH (ref 70–99)

## 2014-02-10 LAB — COMPLEMENT, TOTAL

## 2014-02-10 LAB — PHOSPHORUS: PHOSPHORUS: 7.9 mg/dL — AB (ref 2.3–4.6)

## 2014-02-10 MED ORDER — FENTANYL CITRATE 0.05 MG/ML IJ SOLN
100.0000 ug | INTRAMUSCULAR | Status: DC | PRN
  Administered 2014-02-10 – 2014-02-12 (×8): 100 ug via INTRAVENOUS
  Filled 2014-02-10 (×9): qty 2

## 2014-02-10 MED ORDER — PRO-STAT SUGAR FREE PO LIQD
30.0000 mL | Freq: Every day | ORAL | Status: DC
  Administered 2014-02-10 – 2014-02-13 (×4): 30 mL
  Filled 2014-02-10 (×4): qty 30

## 2014-02-10 MED ORDER — SODIUM POLYSTYRENE SULFONATE 15 GM/60ML PO SUSP
30.0000 g | Freq: Once | ORAL | Status: AC
  Administered 2014-02-10: 30 g via ORAL
  Filled 2014-02-10: qty 120

## 2014-02-10 MED ORDER — FENTANYL CITRATE 0.05 MG/ML IJ SOLN
100.0000 ug | INTRAMUSCULAR | Status: DC | PRN
  Administered 2014-02-11 (×2): 100 ug via INTRAVENOUS
  Filled 2014-02-10: qty 2

## 2014-02-10 MED ORDER — JEVITY 1.2 CAL PO LIQD
1000.0000 mL | ORAL | Status: DC
  Administered 2014-02-11 – 2014-02-12 (×3): 1000 mL
  Filled 2014-02-10 (×7): qty 1000

## 2014-02-10 NOTE — Progress Notes (Signed)
NUTRITION FOLLOW UP  DOCUMENTATION CODES Per approved criteria  -Severe malnutrition in the context of chronic illness -Underweight   INTERVENTION: Continue TF via NGT with Jevity 1.2 decrease goal rate to at 50 ml/h (1200 ml per day) with Prostat 30 ml once daily to provide 1540 kcals, 82 gm protein, 972 ml free water daily.  NUTRITION DIAGNOSIS: Inadequate oral intake related to inability to eat, acute encephalopathy as evidenced by NPO status, ongoing.  Goal: Intake to meet >90% of estimated nutrition needs, met.  Monitor:  TF regimen & tolerance, respiratory status, weight trend, labs  ASSESSMENT: 43 year old Female with PMH of HTN, marijuana use who presented [facility] ED 01/27/2014 after she was found unresponsive at home by a family member. CT head showed no acute intracranial findings. Chest x-ray showed no active disease. Patient has received fluid boluses in ED. Patient was admitted for further evaluation and management of altered mental status, dehydration.  Patient with + HIV test, new diagnosis.  Weight trending up with positive fluid status.  Patient is currently intubated on ventilator support MV: 8.8 L/min Temp (24hrs), Avg:97.5 F (36.4 C), Min:97.3 F (36.3 C), Max:97.6 F (36.4 C)   Height: Ht Readings from Last 1 Encounters:  02/09/14 _0  (1.702 m)    Weight: Wt Readings from Last 1 Encounters:  02/10/14 123 lb 7.3 oz (56 kg)   10/29  99 lb 10/28  88 lb 10/27  88 lb 10/26  92 lb 10/25  86 lb 10/24  86 lb 10/23  92 lb 10/22  94 lb 10/21  88 lb   Estimated Nutritional Needs: Kcal: 1500 Protein: 70-90 gm Fluid: 1.5 L  Skin: Intact  Diet Order: Diet NPO time specified   Intake/Output Summary (Last 24 hours) at 02/10/14 1511 Last data filed at 02/10/14 1400  Gross per 24 hour  Intake 3493.5 ml  Output   2060 ml  Net 1433.5 ml    Labs:   Recent Labs Lab 02/05/14 0226 02/06/14 0500  02/08/14 1704 02/09/14 0400 02/10/14 0433  02/10/14 0700  NA 133* 135*  < > 140 139 143  --   K 3.9 4.2  < > 5.2 5.1 5.6*  --   CL 102 104  < > 109 106 107  --   CO2 14* 15*  < > 14* 17* 22  --   BUN 43* 49*  < > 85* 87* 91*  --   CREATININE 2.83* 2.74*  < > 3.79* 3.78* 3.57*  --   CALCIUM 8.1* 7.7*  < > 8.3* 8.3* 8.7  --   MG 1.7 2.3  --   --   --   --   --   PHOS 2.8 4.6  < > 6.9* 7.1*  --  7.9*  GLUCOSE 149* 196*  < > 148* 221* 128*  --   < > = values in this interval not displayed.  CBG (last 3)   Recent Labs  02/10/14 0358 02/10/14 0829 02/10/14 1203  GLUCAP 125* 134* 135*    Scheduled Meds: . sodium chloride   Intravenous Once  . abacavir  600 mg Per NG tube Daily  . acyclovir  10 mg/kg Intravenous Q24H  . antiseptic oral rinse  7 mL Mouth Rinse QID  . aspirin  81 mg Oral Daily  . azithromycin  600 mg Per Tube Daily  . chlorhexidine  15 mL Mouth Rinse BID  . clindamycin (CLEOCIN) IV  600 mg Intravenous 3 times per day  .  dolutegravir  50 mg Oral BID  . ethambutol  800 mg Per Tube Q24H  . fluconazole  100 mg Per Tube Daily  . heparin subcutaneous  5,000 Units Subcutaneous 3 times per day  . labetalol  100 mg Oral BID  . lamiVUDine  100 mg Oral Daily  . levETIRAcetam  500 mg Intravenous Q12H  . methylPREDNISolone (SOLU-MEDROL) injection  40 mg Intravenous Q12H  . pantoprazole sodium  40 mg Per Tube Daily  . primaquine  30 mg Per Tube Q24H  . rifabutin  150 mg Oral Daily    Continuous Infusions: . dextrose 5 % 1,000 mL with sodium bicarbonate 150 mEq infusion 50 mL/hr at 02/09/14 1553  . feeding supplement (JEVITY 1.2 CAL) 1,000 mL (02/10/14 6916)    Past Medical History  Diagnosis Date  . Hypertension   . HIV (human immunodeficiency virus infection)   . Herpes zoster   . Headache   . Diabetes mellitus     History reviewed. No pertinent past surgical history.   Molli Barrows, RD, LDN, [place] Pager [phone] After Hours Pager [phone]

## 2014-02-10 NOTE — Progress Notes (Addendum)
Patient ID: Beryle BeamsKristy Wyrick, female    DOB: [birth year], 43 y.o.   MRN: [id number] Nephrology Progress Note  S:Patient laying in bed, intubated with rectal tube and foley in place. States she does not have have any known history of kidney disease (she shook her head no). She does not know why she is in the hospital.  O:BP 160/85 mmHg  Pulse 78  Temp(Src) 97.3 F (36.3 C) (Oral)  Resp 12  Ht 5\' 7"  (1.702 m)  Wt 123 lb 7.3 oz (56 kg)  BMI 19.33 kg/m2  SpO2 100%  LMP   Intake/Output Summary (Last 24 hours) at 02/10/14 0644 Last data filed at 02/10/14 0540  Gross per 24 hour  Intake 3536.5 ml  Output   1220 ml  Net 2316.5 ml   Intake/Output: I/O last 3 completed shifts: In: 5404.2 [I.V.:2045.8; NG/GT:2790; IV Piggyback:568.4] Out: 3250 [Urine:1350; Stool:1900]  Intake/Output this shift:  Total I/O In: 1615 [I.V.:675; NG/GT:690; IV Piggyback:250] Out: 760 [Urine:460; Stool:300] Weight change: 4 lb 6.6 oz (2 kg)  Gen: Laying in bed, intubated, opens eyes to command and nods/shakes head to questions HEENT: Left IJ in place. No JVP CVS: Regular rate and rhythm Resp: Clear on anterior auscultation Abd: Soft Ext: No edema   Recent Labs Lab 02/05/14 0226 02/06/14 0500 02/07/14 0400 02/08/14 0443 02/08/14 1704 02/09/14 0400 02/10/14 0433  NA 133* 135* 137 139 140 139 143  K 3.9 4.2 4.8 5.5* 5.2 5.1 5.6*  CL 102 104 106 107 109 106 107  CO2 14* 15* 16* 13* 14* 17* 22  GLUCOSE 149* 196* 194* 151* 148* 221* 128*  BUN 43* 49* 62* 76* 85* 87* 91*  CREATININE 2.83* 2.74* 3.26* 3.51* 3.79* 3.78* 3.57*  ALBUMIN 1.4* 1.3*  --  1.7* 1.6* 1.6*  --   CALCIUM 8.1* 7.7* 8.1* 8.5 8.3* 8.3* 8.7  PHOS 2.8 4.6 4.9* 5.5* 6.9* 7.1*  --   AST 12 <5  --  25  --   --   --   ALT <5 <5  --  28  --   --   --    Liver Function Tests:  Recent Labs Lab 02/05/14 0226 02/06/14 0500 02/08/14 0443 02/08/14 1704 02/09/14 0400  AST 12 <5 25  --   --   ALT <5 <5 28  --   --   ALKPHOS 87 77 82  --    --   BILITOT 0.3 <0.2* <0.2*  --   --   PROT 5.8* 5.6* 6.4  --   --   ALBUMIN 1.4* 1.3* 1.7* 1.6* 1.6*   No results for input(s): LIPASE, AMYLASE in the last 168 hours. No results for input(s): AMMONIA in the last 168 hours. CBC:  Recent Labs Lab 02/06/14 1700 02/07/14 0400 02/08/14 0443 02/09/14 0400 02/10/14 0433  WBC 8.0 11.5* 14.9* 8.3 9.6  NEUTROABS  --  10.9*  --   --   --   HGB 9.0* 8.3* 9.6* 8.0* 8.8*  HCT 27.0* 25.0* 28.6* 23.6* 26.6*  MCV 78.7 76.7* 75.7* 76.6* 76.4*  PLT 276 289 364 290 338   Cardiac Enzymes: No results for input(s): CKTOTAL, CKMB, CKMBINDEX, TROPONINI in the last 168 hours. CBG:  Recent Labs Lab 02/09/14 1235 02/09/14 1545 02/09/14 1933 02/09/14 2337 02/10/14 0358  GLUCAP 205* 145* 154* 154* 125*    Iron Studies: No results for input(s): IRON, TIBC, TRANSFERRIN, FERRITIN in the last 72 hours. Studies/Results: No results found. Medications: . sodium chloride  Intravenous Once  . abacavir  600 mg Per NG tube Daily  . acyclovir  10 mg/kg Intravenous Q24H  . antiseptic oral rinse  7 mL Mouth Rinse QID  . aspirin  81 mg Oral Daily  . azithromycin  600 mg Per Tube Daily  . chlorhexidine  15 mL Mouth Rinse BID  . clindamycin (CLEOCIN) IV  600 mg Intravenous 3 times per day  . dolutegravir  50 mg Oral BID  . ethambutol  800 mg Per Tube Q24H  . fluconazole  100 mg Per Tube Daily  . heparin subcutaneous  5,000 Units Subcutaneous 3 times per day  . labetalol  100 mg Oral BID  . lamiVUDine  100 mg Oral Daily  . levETIRAcetam  500 mg Intravenous Q12H  . methylPREDNISolone (SOLU-MEDROL) injection  40 mg Intravenous Q12H  . pantoprazole sodium  40 mg Per Tube Daily  . primaquine  30 mg Per Tube Q24H  . rifabutin  150 mg Oral Daily  Infusions . dextrose 5 % 1,000 mL with sodium bicarbonate 150 mEq infusion 50 mL/hr at 02/09/14 1553  . feeding supplement (JEVITY 1.2 CAL) 1,000 mL (02/10/14 0842)  . fentaNYL infusion INTRAVENOUS 75 mcg/hr  (02/09/14 1900)   Results for [name], Melissia (MRN [id number]) as of 02/09/2014 12:21  Ref. 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 She was admitted with an initial creatinine of 3.07 with a transient improvement to 1.45, with progressive worsening since 10/29 and appears to be stabilizing, currently at 3.57 from a peak of 3.78. She has been exposed to many antiinfectives (some that are nephrotoxic agents) in addition to having newly diagnosed HIV/AIDs that is most likely a contributing factor. Patient encephalopathy appears to be improving.  Assessment/Plan: 1. AKI with metabolic acidosis: Appears to be multifactorial as patient arrived with kidney injury but has also received many nephrotoxic agents . Concern patient may have HIVAN vs HIVICK as a background cause for CKD with possible superimposedAIN vs ATN. Relative hypotension (big swings in systolic BP) could also be playing a part in patient's worsening kidney function (renal ischemia) . Patient's potassium up to 5.6 today and is s/p kayexalate per primary. Phosphorus is pending; will not start phosphate binders since patient has normal corrected calcium and is intubated (would need calcium based binder and corrected Ca normal). Patient has proteinuria with granular casts on urinalysis. UPC 550 mg (non-nephrotic). Renal ultrasound negative shows echodense kidneys. Will continue isotonic sodium bicarbonate at 50 cc/hour and continue to monitor labs. Urine output adequate (96020ml/24hr) ANA and anti-DNA ab wnl. C3 and C4 wnl. Urinalysis wnl with elevated protein/creatinine of 0.55. 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 Marland Kitchen. Unstable for renal biopsy, which will be  needed to confirm pathology in the future unless full return to normal renal function. 2. Hypertension: patient currently on labetalol. 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 Appears to be improving. 4. HIV/AIDs: recently diagnosed. Unsure if this is a known diagnosis by patient. HIV therapy started 11/2. Will need to be cautious as to not cause further kidney injury. CMV pending. Sputum culture pending (AFB positive) 5. PCP pneumonia: management per primary and infectious disease 6. Diabetes mellitus: listed in  history, although no documented medications  Jacquelin Hawking, MD PGY-2, [facility] 02/10/2014, 6:44 AM   I have seen and examined this patient and agree with plan and assessment in the above note of Dr. Caleb Popp with highlighted additions.  Much more alert.  Renal function stable to slightly  improved, UOP adequate, CVP remains low (3). Acidosis improving on slow sodium bicarb infusion. No changes recommended at this time.  Will likely be able to stop the bicarb drip in the next 24 hours. Hopeful that we will avoid the need for dialysis   Kaio Kuhlman B,MD 02/10/2014 12:05 PM

## 2014-02-10 NOTE — Progress Notes (Signed)
[place] [facility]    Day 13 Acyclovir DAy 6 IV clinda/primaquin (day 6 pcp tx) Day 6 emb/rifabutin/azithro  Day 9 fluconazole   Zosyn D/cd after 7 days    Subjective: oN vent, alert nodding/shaking  head to questions   Antibiotics:  Anti-infectives    Start     Dose/Rate Route Frequency Ordered Stop   02/09/14 1000  lamiVUDine (EPIVIR) 10 MG/ML solution 100 mg     100 mg Oral Daily 02/08/14 1149     02/08/14 1600  azithromycin (ZITHROMAX) tablet 1,200 mg  Status:  Discontinued     1,200 mg Per Tube Weekly 02/03/14 0955 02/03/14 1012   02/08/14 1300  abacavir (ZIAGEN) tablet 600 mg     600 mg Per NG tube Daily 02/08/14 1115     02/08/14 1200  lamiVUDine (EPIVIR) 10 MG/ML solution 150 mg     150 mg Per Tube  Once 02/08/14 1115 02/08/14 1320   02/08/14 1200  dolutegravir (TIVICAY) tablet 50 mg    Comments:  Per NG TUBE   50 mg Oral 2 times daily 02/08/14 1115     02/08/14 1000  azithromycin (ZITHROMAX) 200 MG/5ML suspension 1,200 mg  Status:  Discontinued     1,200 mg Oral Weekly 02/03/14 1013 02/03/14 1015   02/08/14 1000  azithromycin (ZITHROMAX) 200 MG/5ML suspension 1,200 mg  Status:  Discontinued     1,200 mg Per Tube Weekly 02/03/14 1015 02/05/14 1542   02/06/14 1200  acyclovir (ZOVIRAX) 470 mg in dextrose 5 % 100 mL IVPB     10 mg/kg  47 kg109.4 mL/hr over 60 Minutes Intravenous Every 24 hours 02/06/14 1110     02/05/14 2200  clindamycin (CLEOCIN) IVPB 600 mg     600 mg100 mL/hr over 30 Minutes Intravenous 3 times per day 02/05/14 1542     02/05/14 2200  primaquine tablet 30 mg     30 mg Per Tube Every 24 hours 02/05/14 1542     02/05/14 1700  azithromycin (ZITHROMAX) 200 MG/5ML suspension 600 mg     600 mg Per Tube Daily 02/05/14 1542     02/05/14 1700  ethambutol (MYAMBUTOL) tablet 800 mg  Status:   Discontinued     800 mg Oral Every 24 hours 02/05/14 1542 02/05/14 1545   02/05/14 1700  ethambutol (MYAMBUTOL) tablet 800 mg     800 mg Per Tube Every 24 hours 02/05/14 1545     02/05/14 1600  rifabutin (MYCOBUTIN) capsule 150 mg     150 mg Oral Daily 02/05/14 1542     02/05/14 1200  acyclovir (ZOVIRAX) 450 mg in dextrose 5 % 100 mL IVPB  Status:  Discontinued     10 mg/kg  45 kg109 mL/hr over 60 Minutes Intravenous Every 24 hours 02/04/14 1554 02/04/14 1601   02/05/14 1200  acyclovir (ZOVIRAX) 400 mg in dextrose 5 % 100 mL IVPB  Status:  Discontinued     10 mg/kg  40 kg (Order-Specific)108 mL/hr over 60 Minutes Intravenous Every 24 hours 02/04/14 1601 02/06/14 1110   02/05/14 1200  sulfamethoxazole-trimethoprim (BACTRIM) 240 mg of trimethoprim in dextrose 5 % 250 mL IVPB  Status:  Discontinued     240 mg of trimethoprim265 mL/hr over 60 Minutes Intravenous Every 12 hours 02/05/14 1156 02/05/14 1510   02/05/14 1000  fluconazole (DIFLUCAN) 40 MG/ML suspension 100 mg     100 mg Per Tube Daily 02/04/14 1759     02/04/14 2000  piperacillin-tazobactam (  ZOSYN) IVPB 2.25 g     2.25 g100 mL/hr over 30 Minutes Intravenous Every 8 hours 02/04/14 1613 02/04/14 2121   02/04/14 1100  ganciclovir (CYTOVENE) 55 mg in sodium chloride 0.9 % 100 mL IVPB  Status:  Discontinued     1.25 mg/kg  45 kg100 mL/hr over 60 Minutes Intravenous Every 24 hours 02/04/14 1007 02/04/14 1450   02/04/14 1000  fluconazole (DIFLUCAN) tablet 400 mg  Status:  Discontinued     400 mg Per Tube Daily 02/03/14 0955 02/03/14 1015   02/04/14 1000  fluconazole (DIFLUCAN) 40 MG/ML suspension 400 mg  Status:  Discontinued     400 mg Per Tube Daily 02/03/14 1015 02/03/14 1136   02/04/14 1000  fluconazole (DIFLUCAN) 40 MG/ML suspension 200 mg  Status:  Discontinued     200 mg Per Tube Daily 02/03/14 1136 02/04/14 1759   02/04/14 1000  acyclovir (ZOVIRAX) 400 mg in dextrose 5 % 100 mL IVPB  Status:  Discontinued     10 mg/kg  40 kg108  mL/hr over 60 Minutes Intravenous Every 24 hours 02/03/14 1412 02/04/14 0952   02/04/14 0000  sulfamethoxazole-trimethoprim (BACTRIM) 200 mg of trimethoprim in dextrose 5 % 250 mL IVPB  Status:  Discontinued     5 mg/kg of trimethoprim  40 kg262.5 mL/hr over 60 Minutes Intravenous Every 12 hours 02/03/14 1412 02/05/14 1156   02/03/14 2000  piperacillin-tazobactam (ZOSYN) IVPB 2.25 g  Status:  Discontinued     2.25 g100 mL/hr over 30 Minutes Intravenous Every 8 hours 02/03/14 1412 02/04/14 1613   02/02/14 1400  sulfamethoxazole-trimethoprim (BACTRIM) 200 mg in dextrose 5 % 250 mL IVPB  Status:  Discontinued     15 mg/kg/day  40 kg262.5 mL/hr over 60 Minutes Intravenous 3 times per day 02/02/14 1354 02/03/14 1412   02/02/14 1300  fluconazole (DIFLUCAN) tablet 400 mg  Status:  Discontinued     400 mg Oral Daily 02/02/14 1026 02/03/14 0955   02/01/14 1700  sulfamethoxazole-trimethoprim (BACTRIM DS) 800-160 MG per tablet 1 tablet  Status:  Discontinued     1 tablet Oral Once per day on Mon Wed Fri 02/01/14 1549 02/02/14 1316   02/01/14 1600  azithromycin (ZITHROMAX) tablet 1,200 mg  Status:  Discontinued     1,200 mg Oral Weekly 02/01/14 1515 02/03/14 0955   02/01/14 1000  acyclovir (ZOVIRAX) 420 mg in dextrose 5 % 100 mL IVPB  Status:  Discontinued     10 mg/kg  41.9 kg108.4 mL/hr over 60 Minutes Intravenous Every 12 hours 02/01/14 0909 02/03/14 1412   02/01/14 1000  vancomycin (VANCOCIN) IVPB 750 mg/150 ml premix  Status:  Discontinued     750 mg150 mL/hr over 60 Minutes Intravenous Every 24 hours 02/01/14 0910 02/03/14 0910   02/01/14 0823  vancomycin (VANCOCIN) 500 mg in sodium chloride 0.9 % 100 mL IVPB  Status:  Discontinued     500 mg100 mL/hr over 60 Minutes Intravenous Every 24 hours 01/31/14 1029 02/01/14 0909   01/31/14 1200  piperacillin-tazobactam (ZOSYN) IVPB 3.375 g  Status:  Discontinued     3.375 g12.5 mL/hr over 240 Minutes Intravenous Every 8 hours 01/31/14 1029 02/03/14 1412    01/30/14 2100  acyclovir (ZOVIRAX) 390 mg in dextrose 5 % 100 mL IVPB  Status:  Discontinued     10 mg/kg  39.1 kg107.8 mL/hr over 60 Minutes Intravenous Every 24 hours 01/30/14 2018 02/01/14 0908   01/29/14 1600  acyclovir (ZOVIRAX) 420 mg in dextrose 5 % 100  mL IVPB  Status:  Discontinued     420 mg108.4 mL/hr over 60 Minutes Intravenous Every 24 hours 01/29/14 1457 01/29/14 2015   01/29/14 0800  cefTRIAXone (ROCEPHIN) 1 g in dextrose 5 % 50 mL IVPB  Status:  Discontinued     1 g100 mL/hr over 30 Minutes Intravenous Every 24 hours 01/29/14 0707 01/29/14 0710   01/29/14 0800  vancomycin (VANCOCIN) 500 mg in sodium chloride 0.9 % 100 mL IVPB  Status:  Discontinued     500 mg100 mL/hr over 60 Minutes Intravenous Every 48 hours 01/29/14 0719 01/31/14 1029   01/29/14 0730  piperacillin-tazobactam (ZOSYN) IVPB 2.25 g  Status:  Discontinued     2.25 g100 mL/hr over 30 Minutes Intravenous 4 times per day 01/29/14 0719 01/31/14 1028   01/29/14 0715  azithromycin (ZITHROMAX) 500 mg in dextrose 5 % 250 mL IVPB  Status:  Discontinued     500 mg250 mL/hr over 60 Minutes Intravenous Every 24 hours 01/29/14 0704 01/29/14 0710      Medications: Scheduled Meds: . sodium chloride   Intravenous Once  . abacavir  600 mg Per NG tube Daily  . acyclovir  10 mg/kg Intravenous Q24H  . antiseptic oral rinse  7 mL Mouth Rinse QID  . aspirin  81 mg Oral Daily  . azithromycin  600 mg Per Tube Daily  . chlorhexidine  15 mL Mouth Rinse BID  . clindamycin (CLEOCIN) IV  600 mg Intravenous 3 times per day  . dolutegravir  50 mg Oral BID  . ethambutol  800 mg Per Tube Q24H  . feeding supplement (PRO-STAT SUGAR FREE 64)  30 mL Per Tube Daily  . fluconazole  100 mg Per Tube Daily  . heparin subcutaneous  5,000 Units Subcutaneous 3 times per day  . labetalol  100 mg Oral BID  . lamiVUDine  100 mg Oral Daily  . levETIRAcetam  500 mg Intravenous Q12H  . methylPREDNISolone (SOLU-MEDROL) injection  40 mg Intravenous Q12H    . pantoprazole sodium  40 mg Per Tube Daily  . primaquine  30 mg Per Tube Q24H  . rifabutin  150 mg Oral Daily   Continuous Infusions: . dextrose 5 % 1,000 mL with sodium bicarbonate 150 mEq infusion 50 mL/hr at 02/09/14 1553  . feeding supplement (JEVITY 1.2 CAL)     PRN Meds:.acetaminophen (TYLENOL) oral liquid 160 mg/5 mL, fentaNYL, fentaNYL, fentaNYL, hydrALAZINE, metoprolol, ondansetron **OR** ondansetron (ZOFRAN) IV    Objective: Weight change: 4 lb 6.6 oz (2 kg)  Intake/Output Summary (Last 24 hours) at 02/10/14 1812 Last data filed at 02/10/14 1800  Gross per 24 hour  Intake 3619.5 ml  Output   2115 ml  Net 1504.5 ml   Blood pressure 157/84, pulse 95, temperature 97.7 F (36.5 C), temperature source Oral, resp. rate 15, height 5' 7"  (1.702 m), weight 123 lb 7.3 oz (56 kg), SpO2 100 %. Temp:  [97.3 F (36.3 C)-97.7 F (36.5 C)] 97.7 F (36.5 C) (11/04 1619) Pulse Rate:  [64-95] 95 (11/04 1800) Resp:  [11-20] 15 (11/04 1800) BP: (130-197)/(76-96) 157/84 mmHg (11/04 1800) SpO2:  [98 %-100 %] 100 % (11/04 1800) FiO2 (%):  [30 %-40 %] 40 % (11/04 1800) Weight:  [123 lb 7.3 oz (56 kg)] 123 lb 7.3 oz (56 kg) (11/04 0500)  Physical Exam: General: MUCH MORE AWAKE AND ALERT AND NODDING HEAD TO QUESTIONS, restrained on ventilator HEENT: EOMI CVS tachycardical r,  no murmur rubs or gallops Chest: coarse breath sounds  bilaterally Abdomen: softnondistended, normal bowel sounds, Skin: no rashes Neuro: nonfocal  CBC:  CBC Latest Ref Rng 02/10/2014 02/09/2014 02/08/2014  WBC 4.0 - 10.5 K/uL 9.6 8.3 14.9(H)  Hemoglobin 12.0 - 15.0 g/dL 8.8(L) 8.0(L) 9.6(L)  Hematocrit 36.0 - 46.0 % 26.6(L) 23.6(L) 28.6(L)  Platelets 150 - 400 K/uL 338 290 364     BMET  Recent Labs  02/09/14 0400 02/10/14 0433  NA 139 143  K 5.1 5.6*  CL 106 107  CO2 17* 22  GLUCOSE 221* 128*  BUN 87* 91*  CREATININE 3.78* 3.57*  CALCIUM 8.3* 8.7     Liver Panel   Recent Labs   02/08/14 0443 02/08/14 1704 02/09/14 0400  PROT 6.4  --   --   ALBUMIN 1.7* 1.6* 1.6*  AST 25  --   --   ALT 28  --   --   ALKPHOS 82  --   --   BILITOT <0.2*  --   --        Sedimentation Rate No results for input(s): ESRSEDRATE in the last 72 hours. C-Reactive Protein No results for input(s): CRP in the last 72 hours.  Micro Results: Recent Results (from the past 720 hour(s))  Urine culture     Status: None   Collection Time: 01/27/14  3:26 PM  Result Value Ref Range Status   Specimen Description URINE, CATHETERIZED  Final   Special Requests NONE  Final   Culture  Setup Time   Final    01/27/2014 22:22 Performed at [place] Performed at [facility]  Final   Culture NO GROWTH Performed at [facility]  Final   Report Status 01/28/2014 FINAL  Final  Culture, blood (routine x 2)     Status: None   Collection Time: 01/29/14  9:19 AM  Result Value Ref Range Status   Specimen Description BLOOD LEFT ARM  Final   Special Requests BOTTLES DRAWN AEROBIC AND ANAEROBIC 10CC  Final   Culture  Setup Time   Final    01/29/2014 15:04 Performed at [place]   Final    NO GROWTH 5 DAYS Performed at [facility]   Report Status 02/04/2014 FINAL  Final  Culture, blood (routine x 2)     Status: None   Collection Time: 01/29/14  9:30 AM  Result Value Ref Range Status   Specimen Description BLOOD LEFT HAND  Final   Special Requests BOTTLES DRAWN AEROBIC AND ANAEROBIC 5CC  Final   Culture  Setup Time   Final    01/29/2014 15:04 Performed at [place]   Final    NO GROWTH 5 DAYS Performed at [facility]   Report Status 02/04/2014 FINAL  Final  Clostridium Difficile by PCR     Status: None   Collection Time: 01/29/14 11:10 AM  Result Value Ref Range Status   C difficile by pcr NEGATIVE NEGATIVE Final  MRSA PCR Screening     Status: Abnormal   Collection Time:  01/29/14  3:40 PM  Result Value Ref Range Status   MRSA by PCR POSITIVE (A) NEGATIVE Final    Comment:        The GeneXpert MRSA Assay (FDA approved for NASAL specimens only), is one component of a comprehensive MRSA colonization surveillance program. It is not intended to diagnose MRSA infection nor to guide or monitor treatment for MRSA infections. RESULT  CALLED TO, READ BACK BY AND VERIFIED WITH: Sherry Ruffing RN 17:50 01/29/14 ([email or web address])  AFB culture, blood     Status: None (Preliminary result)   Collection Time: 01/30/14 11:13 AM  Result Value Ref Range Status   Specimen Description BLOOD LEFT ARM  Final   Special Requests BOTTLES DRAWN AEROBIC ONLY [place]  Final   Culture   Final    CULTURE WILL BE EXAMINED FOR 6 WEEKS BEFORE ISSUING A FINAL REPORT Performed at [facility]   Report Status PENDING  Incomplete  GC/Chlamydia Probe Amp     Status: None   Collection Time: 01/30/14 11:30 AM  Result Value Ref Range Status   CT Probe RNA NEGATIVE NEGATIVE Final   GC Probe RNA NEGATIVE NEGATIVE Final    Comment: (NOTE)                                                                                       **Normal Reference Range: Negative**      Assay performed using the Gen-Probe APTIMA COMBO2 (R) Assay. Acceptable specimen types for this assay include APTIMA Swabs (Unisex, endocervical, urethral, or vaginal), first void urine, and ThinPrep liquid based cytology samples. Performed at [facility]  CSF culture     Status: None   Collection Time: 01/30/14  5:01 PM  Result Value Ref Range Status   Specimen Description CSF  Final   Special Requests NONE  Final   Gram Stain   Final    CYTOSPIN SLIDE WBC PRESENT, PREDOMINANTLY MONONUCLEAR NO ORGANISMS SEEN Performed at [facility] Performed at [facility]   Culture   Final    NO GROWTH 3 DAYS Performed at [facility]   Report Status 02/03/2014 FINAL  Final  Gram stain     Status: None    Collection Time: 01/30/14  5:01 PM  Result Value Ref Range Status   Specimen Description CSF  Final   Special Requests NONE  Final   Gram Stain   Final    CYTOSPIN SLIDE WBC PRESENT, PREDOMINANTLY MONONUCLEAR NO ORGANISMS SEEN   Report Status 01/30/2014 FINAL  Final  Fungus culture, blood     Status: None   Collection Time: 02/01/14  2:00 PM  Result Value Ref Range Status   Specimen Description BLOOD LEFT HAND  Final   Special Requests BOTTLES DRAWN AEROBIC AND ANAEROBIC 10CC  Final   Culture   Final    NO GROWTH 7 DAYS Performed at [facility]    Report Status 02/09/2014 FINAL  Final  Culture, blood (routine x 2)     Status: None   Collection Time: 02/02/14 12:07 AM  Result Value Ref Range Status   Specimen Description BLOOD RIGHT HAND  Final   Special Requests BOTTLES DRAWN AEROBIC ONLY [facility]  Final   Culture  Setup Time   Final    02/02/2014 03:55 Performed at [place]   Final    NO GROWTH 5 DAYS Performed at [facility]    Report Status 02/08/2014 FINAL  Final  Culture, blood (routine x 2)     Status:  None   Collection Time: 02/02/14 12:18 AM  Result Value Ref Range Status   Specimen Description BLOOD LEFT HAND  Final   Special Requests BOTTLES DRAWN AEROBIC AND ANAEROBIC [place]  Final   Culture  Setup Time   Final    02/02/2014 03:55 Performed at [place]   Final    NO GROWTH 5 DAYS Performed at [facility]    Report Status 02/08/2014 FINAL  Final  Pneumocystis smear by DFA     Status: None   Collection Time: 02/03/14  2:30 PM  Result Value Ref Range Status   Specimen Source-PJSRC SPUTUM  Final   Pneumocystis jiroveci Ag POSITIVE  Final    Comment: Performed at [place] RN 02/04/14 1444 BY [email or web address]  Culture, respiratory (NON-Expectorated)     Status: None   Collection Time: 02/04/14  3:50 AM  Result Value Ref Range Status   Specimen  Description TRACHEAL ASPIRATE  Final   Special Requests Immunocompromised  Final   Gram Stain   Final    FEW WBC PRESENT,BOTH PMN AND MONONUCLEAR RARE SQUAMOUS EPITHELIAL CELLS PRESENT NO ORGANISMS SEEN Performed at [facility]   Culture   Final    NO GROWTH 2 DAYS Performed at [facility]   Report Status 02/06/2014 FINAL  Final  AFB culture with smear     Status: None (Preliminary result)   Collection Time: 02/04/14  3:50 AM  Result Value Ref Range Status   Specimen Description TRACHEAL ASPIRATE  Final   Special Requests NONE  Final   Acid Fast Smear   Final    1+ ACID FAST BACILLI SEEN CRITICAL RESULT CALLED TO, READ BACK BY AND VERIFIED WITH: CIARFI 14:55 110/30/15 FUENG CRITICAL RESULT CALLED TO, READ BACK BY AND VERIFIED WITH: LEE ANN STIMPSON H.D. 10:30 02/08/14 FUENG Performed at [facility]    Culture   Final    CULTURE WILL BE EXAMINED FOR 6 WEEKS BEFORE ISSUING A FINAL REPORT Performed at [facility]    Report Status PENDING  Incomplete  Fungus Culture with Smear     Status: None (Preliminary result)   Collection Time: 02/04/14  3:50 AM  Result Value Ref Range Status   Specimen Description TRACHEAL ASPIRATE  Final   Special Requests NONE  Final   Fungal Smear   Final    NO YEAST OR FUNGAL ELEMENTS SEEN Performed at [facility]    Culture   Final    CANDIDA ALBICANS Performed at [facility]    Report Status PENDING  Incomplete  Respiratory virus panel     Status: None   Collection Time: 02/04/14  3:50 AM  Result Value Ref Range Status   Source - RVPAN TRACHEAL ASPIRATE  Final   Respiratory Syncytial Virus A NOT DETECTED  Final   Respiratory Syncytial Virus B NOT DETECTED  Final   Influenza A NOT DETECTED  Final   Influenza B NOT DETECTED  Final   Parainfluenza 1 NOT DETECTED  Final   Parainfluenza 2 NOT DETECTED  Final   Parainfluenza 3 NOT DETECTED  Final   Metapneumovirus NOT DETECTED  Final    Rhinovirus NOT DETECTED  Final   Adenovirus NOT DETECTED  Final   Influenza A H1 NOT DETECTED  Final   Influenza A H3 NOT DETECTED  Final    Comment: (NOTE)  Normal Reference Range for each Analyte: NOT DETECTED Testing performed using the Luminex xTAG Respiratory Viral Panel test kit. The analytical performance characteristics of this assay have been determined by [facility].  The modifications have not been cleared or approved by the FDA. This assay has been validated pursuant to the CLIA regulations and is used for clinical purposes. Performed at [facility], bal-quantitative     Status: None   Collection Time: 02/04/14 10:31 AM  Result Value Ref Range Status   Specimen Description BRONCHIAL ALVEOLAR LAVAGE  Final   Special Requests Immunocompromised  Final   Gram Stain   Final    FEW WBC PRESENT, PREDOMINANTLY MONONUCLEAR RARE SQUAMOUS EPITHELIAL CELLS PRESENT NO ORGANISMS SEEN Performed at [place] Performed at [facility]  Final   Culture   Final    NO GROWTH 2 DAYS Performed at [facility]   Report Status 02/06/2014 FINAL  Final  AFB culture with smear     Status: None (Preliminary result)   Collection Time: 02/06/14  6:14 PM  Result Value Ref Range Status   Specimen Description SPUTUM  Final   Special Requests NONE  Final   Acid Fast Smear   Final    1+ ACID FAST BACILLI SEEN REFER TO PREVIOUS CULTURE [id number] FOR CALLS Performed at [facility]    Culture   Final    CULTURE WILL BE EXAMINED FOR 6 WEEKS BEFORE ISSUING A FINAL REPORT Performed at [facility]    Report Status PENDING  Incomplete  Culture, blood (routine x 2)     Status: None (Preliminary result)   Collection Time: 02/07/14  6:26 PM  Result Value Ref Range Status   Specimen Description BLOOD LEFT ARM  Final   Special Requests BOTTLES DRAWN AEROBIC AND ANAEROBIC 10CC  Final   Culture  Setup  Time   Final    02/08/2014 02:25 Performed at [facility]    Culture   Final           BLOOD CULTURE RECEIVED NO GROWTH TO DATE CULTURE WILL BE HELD FOR 5 DAYS BEFORE ISSUING A FINAL NEGATIVE REPORT Performed at [facility]    Report Status PENDING  Incomplete  Culture, blood (routine x 2)     Status: None (Preliminary result)   Collection Time: 02/07/14  6:32 PM  Result Value Ref Range Status   Specimen Description BLOOD BLOOD LEFT FOREARM  Final   Special Requests BOTTLES DRAWN AEROBIC ONLY 10CC  Final   Culture  Setup Time   Final    02/08/2014 02:25 Performed at [facility]    Culture   Final           BLOOD CULTURE RECEIVED NO GROWTH TO DATE CULTURE WILL BE HELD FOR 5 DAYS BEFORE ISSUING A FINAL NEGATIVE REPORT Performed at [facility]    Report Status PENDING  Incomplete    Studies/Results: Dg Chest Port 1 View  02/10/2014   CLINICAL DATA:  Check endotracheal tube  EXAM: PORTABLE CHEST - 1 VIEW  COMPARISON:  02/08/2014  FINDINGS: Cardiac shadow is stable. A feeding catheter and left jugular central line are again seen and stable. An endotracheal tube is noted now all at 2.6 cm above the carina. This appears to have been advanced slightly in the interval from the prior exam. The lungs are well aerated without focal infiltrate  IMPRESSION: Tubes  and lines as described above.  Interval clearing of the changes in the right lung base medially. No focal infiltrate is seen.   Electronically Signed   By: Inez Catalina M.D.   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  --continue acyclovir and aim for 21 days  #2 PCP Pneumonia: continue clindamycin Primaquine and steroids for 21 day course  #4 Renal failure :started ARV, renal failure likely multifactorial. Cr stabilizing   #4 Fevers, diarrhea: empiric M avium drugs started, CMV colitis possible as well. CMV PCR  Blood negative   #5 HIV/AIDS: started her on ARV regimen  Abacavir liquid, Epivir liquid and Tivicay per tube  NOTE HER ENCEPHALOPATHY COULD BE IN PART DUE TO UNCONTROLLED HIV VIREMIA AND DIRECT CNS TOXICITY OF HIV VIRUS. 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

## 2014-02-10 NOTE — Progress Notes (Signed)
PULMONARY / CRITICAL CARE MEDICINE   Name: Desiree Hale MRN: [id number] DOB: [birth year]    ADMISSION DATE:  01/27/2014 CONSULTATION DATE:  02/03/14  REFERRING MD :  [facility]ad   CHIEF COMPLAINT:  AMS, dyspnea  INITIAL PRESENTATION: 43 y/o female with hx HTN, admitted 10/21 with AMS/?seizures, diagnosed as HSV encephalitis as well as PNA (?aspiration) and new dx HIV.  Followed by neurology and ID and rx with acyclovir, PCP rx.  On 10/28 developed worsening fevers, tachypnea/resp failure and PCCM consulted for tx ICU.  Further w/u showed HIV pos with CD4 =10 (new diagnosis).    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  Otherwise neg acute.  10/22  EEG  >>> normal drowsy and asleep electroencephalogram. No epileptiform activity is noted.  10/22  ECHO >>> EF 55-60%, grade 1 diastolic dysfunction, no RWMA, small free-flowing pericardial effusion 10/28  Worsening tachypnea, AMS, tx ICU.  Required intubation  10/30  Renal US >> kidneys diffusely echogenic, concerning for medical renal disease 10/31  Remains on full support, fent gtt, received 1 unit PRBC's  11/01  More awake, anemia improved 11/02 CXR Mild infiltrate right lower lobe cannot be excluded  11/02 ID started ARV therapy . Renal - broad ddx for ATN and unstable for biopsy  SUBJECTIVE:    02/10/14: cognition and breathing status continue to improve   VITAL SIGNS: Temp:  [97.3 F (36.3 C)-98 F (36.7 C)] 97.3 F (36.3 C) (11/04 0425) Pulse Rate:  [64-87] 78 (11/04 0500) Resp:  [12-24] 12 (11/04 0500) BP: (118-180)/(67-96) 160/85 mmHg (11/04 0500) SpO2:  [100 %] 100 % (11/04 0500) FiO2 (%):  [30 %-40 %] 30 % (11/04 0400) Weight:  [123 lb 7.3 oz (56 kg)] 123 lb 7.3 oz (56 kg) (11/04 0500)  HEMODYNAMICS: CVP:  [2 mmHg-4 mmHg] 2 mmHg  VENTILATOR SETTINGS: Vent Mode:  [-] PRVC FiO2 (%):  [30 %-40 %] 30 % Set Rate:  [14  bmp-24 bmp] 14 bmp Vt Set:  [500 mL] 500 mL PEEP:  [5 cmH20-8 cmH20] 5 cmH20 Plateau Pressure:  [14 cmH20-21 cmH20] 16 cmH20  INTAKE / OUTPUT:  Intake/Output Summary (Last 24 hours) at 02/10/14 0732 Last data filed at 02/10/14 0540  Gross per 24 hour  Intake 3401.5 ml  Output   1220 ml  Net 2181.5 ml    PHYSICAL EXAMINATION: General:  Frail, cachectic, chronically ill appearing female Neuro:  rass -1,will move toes,  Opened eyes to voice HEENT: OETT, no jvd Cardiovascular:  s1s2 rrr,  Lungs: resp's even/non-labored, lungs coarse bilaterally  Abdomen:  Soft, +bs Musculoskeletal:  Warm and dry, no edema, foot drops boots in place  LABS:  PULMONARY  Recent Labs Lab 02/04/14 02/04/14 0207 02/06/14 0350  PHART 7.159* 7.323* 7.313*  PCO2ART 51.1* 31.3* 29.6*  PO2ART 140.0* 57.9* 241.0*  HCO3 17.0* 15.7* 14.7*  TCO2 18.5 16.7 15.6  O2SAT 97.5 87.2 99.6    CBC  Recent Labs Lab 02/08/14 0443 02/09/14 0400 02/10/14 0433  HGB 9.6* 8.0* 8.8*  HCT 28.6* 23.6* 26.6*  WBC 14.9* 8.3 9.6  PLT 364 290 338    COAGULATION No results for input(s): INR in the last 168 hours.  CARDIAC  No results for input(s): TROPONINI in the last 168 hours. No results for input(s): PROBNP in the last 168 hours.   CHEMISTRY  Recent Labs Lab 02/05/14 0226 02/06/14 0500 02/07/14 0400  02/08/14 0443 02/08/14 1704 02/09/14 0400 02/10/14 0433  NA 133* 135* 137 139 140 139 143  K 3.9 4.2 4.8 5.5* 5.2 5.1 5.6*  CL 102 104 106 107 109 106 107  CO2 14* 15* 16* 13* 14* 17* 22  GLUCOSE 149* 196* 194* 151* 148* 221* 128*  BUN 43* 49* 62* 76* 85* 87* 91*  CREATININE 2.83* 2.74* 3.26* 3.51* 3.79* 3.78* 3.57*  CALCIUM 8.1* 7.7* 8.1* 8.5 8.3* 8.3* 8.7  MG 1.7 2.3  --   --   --   --   --   PHOS 2.8 4.6 4.9* 5.5* 6.9* 7.1*  --    Estimated Creatinine Clearance: 17.8 mL/min (by C-G formula based on Cr of 3.57).   LIVER  Recent Labs Lab 02/05/14 0226 02/06/14 0500 02/08/14 0443  02/08/14 1704 02/09/14 0400  AST 12 <5 25  --   --   ALT <5 <5 28  --   --   ALKPHOS 87 77 82  --   --   BILITOT 0.3 <0.2* <0.2*  --   --   PROT 5.8* 5.6* 6.4  --   --   ALBUMIN 1.4* 1.3* 1.7* 1.6* 1.6*     INFECTIOUS  Recent Labs Lab 02/07/14 0358  LATICACIDVEN 1.2     ENDOCRINE CBG (last 3)   Recent Labs  02/09/14 1933 02/09/14 2337 02/10/14 0358  GLUCAP 154* 154* 125*     IMAGING x48h No results found.     ASSESSMENT / PLAN:  PULMONARY ETT 02/03/14 >> Acute Hypoxic Resp Failure  - Mild pulmonary infiltrates - PCP  - S/P Bronchoscopy - see ID  02/10/14: 30% fio2, failure to wean due to severe deconditioning and cachexia and encephalopathy   P:   Full support, PRVC 500/14/5/30% PRN ABG Wean PEEP/FiO2 for sats > 92% Trend CXR SBT / WUA daily  Steroids for PCP  NEUROLOGIC CMV >> 10 EBV >> less than 200  CSF JCV >> not detected  A: Acute encephalopathy - in setting of HSV encephalitis HSV encephalitis    - openeing eyes to command 02/10/14. Improving going forward  P:   RASS goal: 0 Fentanyl drip, monitor for asynchrony  Neurology signed off  Cont keppra  Acyclovir as below  CSF studies as above    CARDIOVASCULAR HTN  Tachycardia - Resolved  SIRS  Elevated troponins - suspect demand ischemia. Resolved   Diastolic dysfunction - EF 55-60% Small Pericardial Effusion - free flowing, noted on ECHO 10/22   - maintaining bp/hr on 02/10/14  P:  Rx fever  PRN lopressor to keep HR <130 PRN hydralazine to keep SBP <170 ASA Hold labetalol SBP <130  RENAL ANA >>Neg  Anti-ds DNA>>neg  C3 >103 C4 >16 Lactic acid 1.1>1.2  AKI / ARF - bactrim stopped 10/30, renal US negative 10/30, UA with granular casts c/w medical renal dz.   Compensated AG Metabolic Acidosis - HIVAN vs HIVCK Hyperkalemia  - creatinine improving 02/10/14  P:   Kayexalate x 1  Trend BMP Monitor I/Os renal consult initiated Bicarb; serologies ordered; may require  HD at some point    GASTROINTESTINAL Protein calorie malnutrition  Diarrhea - Improving. CDiff neg, Giardia and cryptosporidium negative  P:  Nutrition following  -TFs PPI C. Diff 11/01>> Trend LFT's with acyclovir, WNL 11/02   HEMATOLOGIC DVT prevention Anemia - tx 10/31 x 1 P:  SQ heparin Trend CBC  FOB stool    INFECTIOUS BCx2 10/23>>>neg CSF culture 10/23>>> neg UC 10/21>>>neg  BAL 10/29>> neg Sputum 10/29 >> neg Sputum PCP DFA 10/28 >> PCP pos Sputum AFB cx w/ smear 10/29>>+1 AFB  BCx2 10/27>>>No Growth Sputum AFB 10/29 >> prelim neg >> Sputum Fungal 10/29 >> prelim neg >> BAL resp viral panel 10/29 >>none detected  Sputum AFB cx w/ smear 10/31>>+1 AFB  M. TB PCR 10/31 >> Quant gold 11/01>>Indeterm   A  HIV/AIDS - new dx HSV encephalitis  Persistent Fever - afebrile 24 hours.  PCP PNA TB vs MAC  Oral thrush  - afebrile 02/10/14  P:    ARV's per ID  Acyclovir 10/26>>> Zosyn 10/25>>>10/29 Bactrim 10/27>>>10/30 Azithro (weekly, MAC prophylaxis) 10/27>>> Diflucan 10/27>>> Clinda 10/30 >>> Ethambutol 10/30 >>> Rifabutin 10/30 >>>  Antimicrobial therapies per ID Steroids IV for PCP  Repeat sputum for AFB & TB PCR 10/31  ENDOCRINE No active issue  P:   High risk hypoglycemia  D5NS at 45ml/hr  FAMILY:   Updates: No family at bedside 02/10/14 INterdisciplinary family meet :  14 days LOS. Palliative care organizing family meeting.     Today's Summary:  43 y/o F with new diagnosed HIV, PCP PNA, TB positive sputum (work up for TB vs MAC in progress), course complicated by poor weaning efforts, anemia and renal failure.  Continue current care for now. Palliative consulted on 02/08/14.     Myra Rude, MD PGY-2, [facility] 02/10/2014 7:32 AM

## 2014-02-11 ENCOUNTER — Inpatient Hospital Stay (HOSPITAL_COMMUNITY): Payer: Medicaid Other

## 2014-02-11 LAB — RENAL FUNCTION PANEL
ALBUMIN: 2 g/dL — AB (ref 3.5–5.2)
Anion gap: 18 — ABNORMAL HIGH (ref 5–15)
BUN: 95 mg/dL — AB (ref 6–23)
CALCIUM: 8.7 mg/dL (ref 8.4–10.5)
CO2: 23 mEq/L (ref 19–32)
CREATININE: 3.31 mg/dL — AB (ref 0.50–1.10)
Chloride: 104 mEq/L (ref 96–112)
GFR calc Af Amer: 18 mL/min — ABNORMAL LOW (ref >90)
GFR calc non Af Amer: 16 mL/min — ABNORMAL LOW (ref >90)
Glucose, Bld: 156 mg/dL — ABNORMAL HIGH (ref 70–99)
Phosphorus: 7.4 mg/dL — ABNORMAL HIGH (ref 2.3–4.6)
Potassium: 5 mEq/L (ref 3.7–5.3)
Sodium: 145 mEq/L (ref 137–147)

## 2014-02-11 LAB — CBC
HCT: 30.1 % — ABNORMAL LOW (ref 36.0–46.0)
Hemoglobin: 9.9 g/dL — ABNORMAL LOW (ref 12.0–15.0)
MCH: 25.9 pg — AB (ref 26.0–34.0)
MCHC: 32.9 g/dL (ref 30.0–36.0)
MCV: 78.8 fL (ref 78.0–100.0)
Platelets: 405 10*3/uL — ABNORMAL HIGH (ref 150–400)
RBC: 3.82 MIL/uL — ABNORMAL LOW (ref 3.87–5.11)
RDW: 18.3 % — AB (ref 11.5–15.5)
WBC: 10.8 10*3/uL — ABNORMAL HIGH (ref 4.0–10.5)

## 2014-02-11 LAB — GLUCOSE, CAPILLARY
Glucose-Capillary: 125 mg/dL — ABNORMAL HIGH (ref 70–99)
Glucose-Capillary: 148 mg/dL — ABNORMAL HIGH (ref 70–99)
Glucose-Capillary: 150 mg/dL — ABNORMAL HIGH (ref 70–99)
Glucose-Capillary: 153 mg/dL — ABNORMAL HIGH (ref 70–99)
Glucose-Capillary: 163 mg/dL — ABNORMAL HIGH (ref 70–99)

## 2014-02-11 MED ORDER — LABETALOL HCL 200 MG PO TABS
200.0000 mg | ORAL_TABLET | Freq: Two times a day (BID) | ORAL | Status: DC
  Administered 2014-02-11 – 2014-02-23 (×24): 200 mg via ORAL
  Filled 2014-02-11 (×25): qty 1

## 2014-02-11 NOTE — Progress Notes (Addendum)
Patient discussed in progression this morning for possible Palliative Care consult. Clinical Social Worker attempted to contact pt's husband, Desiree Hale and left a message for a returned phone to this CSW or the unit ([id number]).   CSW spoke with pt's daughter, Desiree Hale via telephone in reference to unsuccessful contact with pt's husband. Pt's daughter explained, she has permission from father/pt's husband to receive information as well. Pt's daughter also reported setting up a password to receive information via telephone with pt's husband. Pt's daughter also stated she would try to contact pt's husband and deliver message for him to contact CSW and/or unit. Pt's daughter reported receiving an update from RN during visitation today around 12/1PM.   CSW will continue to actively contact pt's husband.   CSW will continue to follow pt and provide support/facilitate discharge needs once pt is medically stable.   Derenda FennelBashira Kewan Mcnease, MSW, LCSWA [phone] 02/11/2014 3:19 PM

## 2014-02-11 NOTE — Progress Notes (Signed)
PULMONARY / CRITICAL CARE MEDICINE   Name: Rochel Privett MRN: [id number] DOB: September 29, 1970    ADMISSION DATE:  01/27/2014 CONSULTATION DATE:  02/03/14  REFERRING MD :  [facility]   CHIEF COMPLAINT:  AMS, dyspnea  INITIAL PRESENTATION: 43 y/o female with hx HTN, admitted 10/21 with AMS/?seizures, diagnosed as HSV encephalitis as well as PNA (?aspiration) and new dx HIV.  Followed by neurology and ID and rx with acyclovir, PCP rx.  On 10/28 developed worsening fevers, tachypnea/resp failure and PCCM consulted for tx ICU.  Further w/u showed HIV pos with CD4 =10 (new diagnosis).    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  Otherwise neg acute.  10/22  EEG  >>> normal drowsy and asleep electroencephalogram. No epileptiform activity is noted.  10/22  ECHO >>> EF 55-60%, grade 1 diastolic dysfunction, no RWMA, small free-flowing pericardial effusion 10/28  Worsening tachypnea, AMS, tx ICU.  Required intubation  10/30  Renal US >> kidneys diffusely echogenic, concerning for medical renal disease 10/31  Remains on full support, fent gtt, received 1 unit PRBC's  11/01  More awake, anemia improved 11/02 CXR Mild infiltrate right lower lobe cannot be excluded  11/02 ID started ARV therapy . Renal - broad ddx for ATN and unstable for biopsy  SUBJECTIVE:    02/11/14: cognition and breathing status continue to improve   VITAL SIGNS: Temp:  [97.5 F (36.4 C)-98 F (36.7 C)] 97.7 F (36.5 C) (11/05 0400) Pulse Rate:  [69-113] 91 (11/05 0700) Resp:  [11-20] 15 (11/05 0700) BP: (140-200)/(79-109) 140/79 mmHg (11/05 0700) SpO2:  [93 %-100 %] 97 % (11/05 0700) FiO2 (%):  [30 %-40 %] 30 % (11/05 0415) Weight:  [123 lb 14.4 oz (56.2 kg)] 123 lb 14.4 oz (56.2 kg) (11/05 0409)  HEMODYNAMICS: CVP:  [3 mmHg-4 mmHg] 4 mmHg  VENTILATOR SETTINGS: Vent Mode:  [-] PRVC FiO2 (%):  [30 %-40 %] 30 % Set  Rate:  [14 bmp] 14 bmp Vt Set:  [500 mL] 500 mL PEEP:  [5 cmH20] 5 cmH20 Plateau Pressure:  [10 cmH20-16 cmH20] 16 cmH20  INTAKE / OUTPUT:  Intake/Output Summary (Last 24 hours) at 02/11/14 4098 Last data filed at 02/11/14 0600  Gross per 24 hour  Intake   3017 ml  Output   1775 ml  Net   1242 ml    PHYSICAL EXAMINATION: General:  Frail, cachectic, chronically ill appearing female Neuro:  rass -1,will move toes,  Opened eyes to voice HEENT: OETT, no jvd Cardiovascular:  s1s2 rrr,  Lungs: resp's even/non-labored, lungs coarse bilaterally  Abdomen:  Soft, +bs Musculoskeletal:  Warm and dry, no edema, foot drops boots in place  LABS:  PULMONARY  Recent Labs Lab 02/06/14 0350  PHART 7.313*  PCO2ART 29.6*  PO2ART 241.0*  HCO3 14.7*  TCO2 15.6  O2SAT 99.6    CBC  Recent Labs Lab 02/09/14 0400 02/10/14 0433 02/11/14 0400  HGB 8.0* 8.8* 9.9*  HCT 23.6* 26.6* 30.1*  WBC 8.3 9.6 10.8*  PLT 290 338 405*    COAGULATION No results for input(s): INR in the last 168 hours.  CARDIAC  No results for input(s): TROPONINI in the last 168 hours. No results for input(s): PROBNP in the last 168 hours.   CHEMISTRY  Recent Labs Lab 02/05/14 0226 02/06/14 0500  02/08/14 0443 02/08/14 1704 02/09/14 0400 02/10/14 0433 02/10/14 0700 02/11/14 0400  NA 133* 135*  < > 139 140 139 143  --  145  K 3.9 4.2  < > 5.5* 5.2 5.1 5.6*  --  5.0  CL 102 104  < > 107 109 106 107  --  104  CO2 14* 15*  < > 13* 14* 17* 22  --  23  GLUCOSE 149* 196*  < > 151* 148* 221* 128*  --  156*  BUN 43* 49*  < > 76* 85* 87* 91*  --  95*  CREATININE 2.83* 2.74*  < > 3.51* 3.79* 3.78* 3.57*  --  3.31*  CALCIUM 8.1* 7.7*  < > 8.5 8.3* 8.3* 8.7  --  8.7  MG 1.7 2.3  --   --   --   --   --   --   --   PHOS 2.8 4.6  < > 5.5* 6.9* 7.1*  --  7.9* 7.4*  < > = values in this interval not displayed. Estimated Creatinine Clearance: 19.2 mL/min (by C-G formula based on Cr of 3.31).   LIVER  Recent  Labs Lab 02/05/14 0226 02/06/14 0500 02/08/14 0443 02/08/14 1704 02/09/14 0400 02/11/14 0400  AST 12 <5 25  --   --   --   ALT <5 <5 28  --   --   --   ALKPHOS 87 77 82  --   --   --   BILITOT 0.3 <0.2* <0.2*  --   --   --   PROT 5.8* 5.6* 6.4  --   --   --   ALBUMIN 1.4* 1.3* 1.7* 1.6* 1.6* 2.0*     INFECTIOUS  Recent Labs Lab 02/07/14 0358  LATICACIDVEN 1.2     ENDOCRINE CBG (last 3)   Recent Labs  02/10/14 1911 02/11/14 0043 02/11/14 0358  GLUCAP 167* 163* 153*     IMAGING x48h Dg Chest Port 1 View  02/11/2014   CLINICAL DATA:  Ventilated patient; history of HIV knee, acute respiratory failure and aspiration pneumonia  EXAM: PORTABLE CHEST - 1 VIEW  COMPARISON:  Portable chest x-ray of February 10, 2014.  FINDINGS: The lungs are well-expanded and clear. The heart and mediastinal structures are normal. There is no pleural effusion or pneumothorax. The endotracheal tube tip lies 5.1 cm above the crotch of the carina. The left internal jugular venous catheter tip projects over the mid to distal SVC. The feeding tube tip projects below the inferior margin of the image. The bony thorax is unremarkable.  IMPRESSION: There is no evidence of pneumonia nor other acute cardiopulmonary abnormality.   Electronically Signed   By: David  [place]   On: 02/11/2014 07:48   Dg Chest Port 1 View  02/10/2014   CLINICAL DATA:  Check endotracheal tube  EXAM: PORTABLE CHEST - 1 VIEW  COMPARISON:  02/08/2014  FINDINGS: Cardiac shadow is stable. A feeding catheter and left jugular central line are again seen and stable. An endotracheal tube is noted now all at 2.6 cm above the carina. This appears to have been advanced slightly in the interval from the prior exam. The lungs are well aerated without focal infiltrate  IMPRESSION: Tubes and lines as described above.  Interval clearing of the changes in the right lung base medially. No focal infiltrate is seen.   Electronically Signed   By: Alcide CleverMark   Lukens M.D.   On: 02/10/2014 08:01    ASSESSMENT / PLAN:  PULMONARY ETT 02/03/14 >> Acute Hypoxic Resp Failure  -  Mild pulmonary infiltrates - PCP  - S/P Bronchoscopy - see ID    P:   Full support, PRVC 500/22/5/30% PRN ABG Wean PEEP/FiO2 for sats > 92% Trend CXR SBT / WUA daily  Steroids for PCP  NEUROLOGIC CMV >> 10 EBV >> less than 200  CSF JCV >> not detected  A: Acute encephalopathy - in setting of HSV encephalitis HSV encephalitis   P:   RASS goal: 0 Fentanyl drip, monitor for asynchrony  Neurology signed off  Cont keppra  Acyclovir as below  CSF studies as above    CARDIOVASCULAR HTN  Tachycardia - Resolved  SIRS  Elevated troponins - suspect demand ischemia. Resolved   Diastolic dysfunction - EF 55-60% Small Pericardial Effusion - free flowing, noted on ECHO 10/22  P:  Rx fever  PRN lopressor to keep HR <130 PRN hydralazine to keep SBP <170 ASA Hold labetalol SBP <130  RENAL ANA >>Neg  Anti-ds DNA>>neg  C3 >103 C4 >16 Lactic acid 1.1>1.2  AKI / ARF - bactrim stopped 10/30, renal US negative 10/30, UA with granular casts c/w medical renal dz.   Compensated AG Metabolic Acidosis - HIVAN vs HIVCK Hyperkalemia - resolved   P:   Trend BMP Monitor I/Os renal consult initiated Bicarb; may require HD at some point    GASTROINTESTINAL Protein calorie malnutrition  Diarrhea - Improving. CDiff neg, Giardia and cryptosporidium negative  P:  Nutrition following  -TFs PPI C. Diff 11/01>> Trend LFT's with acyclovir, WNL 11/02   HEMATOLOGIC DVT prevention Anemia - tx 10/31 x 1 P:  SQ heparin Trend CBC  FOB stool    INFECTIOUS BCx2 10/23>>>neg CSF culture 10/23>>> neg UC 10/21>>>neg  BAL 10/29>> neg Sputum 10/29 >> neg Sputum PCP DFA 10/28 >> PCP pos Sputum AFB cx w/ smear 10/29>>+1 AFB  BCx2 10/27>>>No Growth Sputum AFB 10/29 >> prelim neg >> Sputum Fungal 10/29 >> prelim neg >> BAL resp viral panel 10/29 >>none detected   Sputum AFB cx w/ smear 10/31>>+1 AFB  M. TB PCR 10/31 >> Quant gold 11/01>>Indeterm BCx2 11/01>>NGTD   A  HIV/AIDS - new dx HSV encephalitis  Persistent Fever - Resolved  PCP PNA TB vs MAC  Oral thrush  P:    ARV's per ID  Acyclovir 10/26>>> Zosyn 10/25>>>10/29 Bactrim 10/27>>>10/30 Azithro (weekly, MAC prophylaxis) 10/27>>> Diflucan 10/27>>> Clinda 10/30 >>> Ethambutol 10/30 >>> Rifabutin 10/30 >>>  Antimicrobial therapies per ID Steroids IV for PCP  Repeat sputum for AFB & TB PCR 10/31  ENDOCRINE No active issue  P:   High risk hypoglycemia  D5NS at 2550ml/hr  FAMILY:   Updates: No family at bedside 02/11/14 INterdisciplinary family meet :  15 days LOS. Palliative care organizing family meeting.     Today's Summary:  43 y/o F with new diagnosed HIV, PCP PNA, TB positive sputum (work up for TB vs MAC in progress), course complicated by poor weaning efforts, anemia and renal failure.  Continue current care for now.     Myra RudeJeremy E Schmitz, MD PGY-2, [facility]  ATTENDING NOTE: I have personally reviewed patient's available data, including medical history, events of note, physical examination and test results as part of my evaluation. I have discussed with resident/NP and other careteam providers such as pharmacist, RN and RRT & co-ordinated with consultants. 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  Rest per NP/medical resident whose note is outlined above and that I agree with and edited in full.   She has critical illness polyneuropathy,day 8 mechanical ventilation-given some progress, I'm still hopeful of a trial of extubation in next 24-48  hours  The patient is critically ill with multiple organ systems failure and requires high complexity decision making for assessment and support, frequent evaluation and titration of therapies, application of advanced monitoring technologies and extensive interpretation of multiple databases. Critical Care Time devoted to patient care services described in this note is 35 minutes.   Oretha Milch MD  02/11/2014 8:11 AM

## 2014-02-11 NOTE — Progress Notes (Signed)
ANTIBIOTIC CONSULT NOTE - FOLLOW UP  Pharmacy Consult for Clinda and Primaquine, Acyclovir, Fluconazole, Azithromycin, Ethambutol, Rifabutin Indication: PCP, HSV encephalitis, thrush, MAC  Allergies  Allergen Reactions  . Ace Inhibitors Anaphylaxis  . Oxycodone Other (See Comments)    Patient Measurements: Height: 5' 7"  (170.2 cm) Weight: 123 lb 14.4 oz (56.2 kg) IBW/kg (Calculated) : 61.6   Vital Signs: Temp: 97.1 F (36.2 C) (11/05 1246) Temp Source: Oral (11/05 1246) BP: 141/80 mmHg (11/05 1200) Pulse Rate: 95 (11/05 1200) Intake/Output from previous day: 11/04 0701 - 11/05 0700 In: 3252 [I.V.:1462; NG/GT:1340; IV Piggyback:450] Out: 1925 [Urine:1125; Stool:800] Intake/Output from this shift: Total I/O In: 550 [I.V.:300; NG/GT:250] Out: 775 [Urine:325; Stool:450]  Labs:  Recent Labs  02/08/14 1627  02/09/14 0400 02/10/14 0433 02/11/14 0400  WBC  --   --  8.3 9.6 10.8*  HGB  --   --  8.0* 8.8* 9.9*  PLT  --   --  290 338 405*  LABCREA 52.59  53.40  --   --   --   --   CREATININE  --   < > 3.78* 3.57* 3.31*  < > = values in this interval not displayed. Estimated Creatinine Clearance: 19.2 mL/min (by C-G formula based on Cr of 3.31). No results for input(s): VANCOTROUGH, VANCOPEAK, VANCORANDOM, GENTTROUGH, GENTPEAK, GENTRANDOM, TOBRATROUGH, TOBRAPEAK, TOBRARND, AMIKACINPEAK, AMIKACINTROU, AMIKACIN in the last 72 hours.   Microbiology: Recent Results (from the past 720 hour(s))  Urine culture     Status: None   Collection Time: 01/27/14  3:26 PM  Result Value Ref Range Status   Specimen Description URINE, CATHETERIZED  Final   Special Requests NONE  Final   Culture  Setup Time   Final    01/27/2014 22:22 Performed at [place] Performed at [facility]  Final   Culture NO GROWTH Performed at [facility]  Final   Report Status 01/28/2014 FINAL  Final  Culture, blood (routine x 2)     Status: None    Collection Time: 01/29/14  9:19 AM  Result Value Ref Range Status   Specimen Description BLOOD LEFT ARM  Final   Special Requests BOTTLES DRAWN AEROBIC AND ANAEROBIC 10CC  Final   Culture  Setup Time   Final    01/29/2014 15:04 Performed at [place]   Final    NO GROWTH 5 DAYS Performed at [facility]   Report Status 02/04/2014 FINAL  Final  Culture, blood (routine x 2)     Status: None   Collection Time: 01/29/14  9:30 AM  Result Value Ref Range Status   Specimen Description BLOOD LEFT HAND  Final   Special Requests BOTTLES DRAWN AEROBIC AND ANAEROBIC 5CC  Final   Culture  Setup Time   Final    01/29/2014 15:04 Performed at [place]   Final    NO GROWTH 5 DAYS Performed at [facility]   Report Status 02/04/2014 FINAL  Final  Clostridium Difficile by PCR     Status: None   Collection Time: 01/29/14 11:10 AM  Result Value Ref Range Status   C difficile by pcr NEGATIVE NEGATIVE Final  MRSA PCR Screening     Status: Abnormal   Collection Time: 01/29/14  3:40 PM  Result Value Ref Range Status   MRSA by PCR POSITIVE (A) NEGATIVE Final    Comment:  The GeneXpert MRSA Assay (FDA approved for NASAL specimens only), is one component of a comprehensive MRSA colonization surveillance program. It is not intended to diagnose MRSA infection nor to guide or monitor treatment for MRSA infections. RESULT CALLED TO, READ BACK BY AND VERIFIED WITH: Sherry Ruffing RN 17:50 01/29/14 ([email or web address])  AFB culture, blood     Status: None (Preliminary result)   Collection Time: 01/30/14 11:13 AM  Result Value Ref Range Status   Specimen Description BLOOD LEFT ARM  Final   Special Requests BOTTLES DRAWN AEROBIC ONLY [place]  Final   Culture   Final    CULTURE WILL BE EXAMINED FOR 6 WEEKS BEFORE ISSUING A FINAL REPORT Performed at [facility]   Report Status PENDING  Incomplete  GC/Chlamydia Probe Amp     Status: None    Collection Time: 01/30/14 11:30 AM  Result Value Ref Range Status   CT Probe RNA NEGATIVE NEGATIVE Final   GC Probe RNA NEGATIVE NEGATIVE Final    Comment: (NOTE)                                                                                       **Normal Reference Range: Negative**      Assay performed using the Gen-Probe APTIMA COMBO2 (R) Assay. Acceptable specimen types for this assay include APTIMA Swabs (Unisex, endocervical, urethral, or vaginal), first void urine, and ThinPrep liquid based cytology samples. Performed at [facility]  CSF culture     Status: None   Collection Time: 01/30/14  5:01 PM  Result Value Ref Range Status   Specimen Description CSF  Final   Special Requests NONE  Final   Gram Stain   Final    CYTOSPIN SLIDE WBC PRESENT, PREDOMINANTLY MONONUCLEAR NO ORGANISMS SEEN Performed at [facility] Performed at [facility]   Culture   Final    NO GROWTH 3 DAYS Performed at [facility]   Report Status 02/03/2014 FINAL  Final  Gram stain     Status: None   Collection Time: 01/30/14  5:01 PM  Result Value Ref Range Status   Specimen Description CSF  Final   Special Requests NONE  Final   Gram Stain   Final    CYTOSPIN SLIDE WBC PRESENT, PREDOMINANTLY MONONUCLEAR NO ORGANISMS SEEN   Report Status 01/30/2014 FINAL  Final  Fungus culture, blood     Status: None   Collection Time: 02/01/14  2:00 PM  Result Value Ref Range Status   Specimen Description BLOOD LEFT HAND  Final   Special Requests BOTTLES DRAWN AEROBIC AND ANAEROBIC 10CC  Final   Culture   Final    NO GROWTH 7 DAYS Performed at [facility]    Report Status 02/09/2014 FINAL  Final  Culture, blood (routine x 2)     Status: None   Collection Time: 02/02/14 12:07 AM  Result Value Ref Range Status   Specimen Description BLOOD RIGHT HAND  Final   Special Requests BOTTLES DRAWN AEROBIC ONLY [facility]  Final   Culture  Setup Time   Final    02/02/2014  03:55 Performed at [facility]  Culture   Final    NO GROWTH 5 DAYS Performed at [facility]    Report Status 02/08/2014 FINAL  Final  Culture, blood (routine x 2)     Status: None   Collection Time: 02/02/14 12:18 AM  Result Value Ref Range Status   Specimen Description BLOOD LEFT HAND  Final   Special Requests BOTTLES DRAWN AEROBIC AND ANAEROBIC [facility] EACH  Final   Culture  Setup Time   Final    02/02/2014 03:55 Performed at [place]   Final    NO GROWTH 5 DAYS Performed at [facility]    Report Status 02/08/2014 FINAL  Final  Pneumocystis smear by DFA     Status: None   Collection Time: 02/03/14  2:30 PM  Result Value Ref Range Status   Specimen Source-PJSRC SPUTUM  Final   Pneumocystis jiroveci Ag POSITIVE  Final    Comment: Performed at [place] RN 02/04/14 1444 BY [email or web address]  Culture, respiratory (NON-Expectorated)     Status: None   Collection Time: 02/04/14  3:50 AM  Result Value Ref Range Status   Specimen Description TRACHEAL ASPIRATE  Final   Special Requests Immunocompromised  Final   Gram Stain   Final    FEW WBC PRESENT,BOTH PMN AND MONONUCLEAR RARE SQUAMOUS EPITHELIAL CELLS PRESENT NO ORGANISMS SEEN Performed at [facility]   Culture   Final    NO GROWTH 2 DAYS Performed at [facility]   Report Status 02/06/2014 FINAL  Final  AFB culture with smear     Status: None (Preliminary result)   Collection Time: 02/04/14  3:50 AM  Result Value Ref Range Status   Specimen Description TRACHEAL ASPIRATE  Final   Special Requests NONE  Final   Acid Fast Smear   Final    1+ ACID FAST BACILLI SEEN CRITICAL RESULT CALLED TO, READ BACK BY AND VERIFIED WITH: CIARFI 14:55 110/30/15 FUENG CRITICAL RESULT CALLED TO, READ BACK BY AND VERIFIED WITH: LEE ANN STIMPSON H.D. 10:30 02/08/14 FUENG Performed at [facility]    Culture   Final    CULTURE WILL BE  EXAMINED FOR 6 WEEKS BEFORE ISSUING A FINAL REPORT Performed at [facility]    Report Status PENDING  Incomplete  Fungus Culture with Smear     Status: None (Preliminary result)   Collection Time: 02/04/14  3:50 AM  Result Value Ref Range Status   Specimen Description TRACHEAL ASPIRATE  Final   Special Requests NONE  Final   Fungal Smear   Final    NO YEAST OR FUNGAL ELEMENTS SEEN Performed at [facility]    Culture   Final    CANDIDA ALBICANS Performed at [facility]    Report Status PENDING  Incomplete  Respiratory virus panel     Status: None   Collection Time: 02/04/14  3:50 AM  Result Value Ref Range Status   Source - RVPAN TRACHEAL ASPIRATE  Final   Respiratory Syncytial Virus A NOT DETECTED  Final   Respiratory Syncytial Virus B NOT DETECTED  Final   Influenza A NOT DETECTED  Final   Influenza B NOT DETECTED  Final   Parainfluenza 1 NOT DETECTED  Final   Parainfluenza 2 NOT DETECTED  Final   Parainfluenza 3 NOT DETECTED  Final   Metapneumovirus NOT DETECTED  Final   Rhinovirus NOT DETECTED  Final  Adenovirus NOT DETECTED  Final   Influenza A H1 NOT DETECTED  Final   Influenza A H3 NOT DETECTED  Final    Comment: (NOTE)       Normal Reference Range for each Analyte: NOT DETECTED Testing performed using the Luminex xTAG Respiratory Viral Panel test kit. The analytical performance characteristics of this assay have been determined by [facility].  The modifications have not been cleared or approved by the FDA. This assay has been validated pursuant to the CLIA regulations and is used for clinical purposes. Performed at [facility], bal-quantitative     Status: None   Collection Time: 02/04/14 10:31 AM  Result Value Ref Range Status   Specimen Description BRONCHIAL ALVEOLAR LAVAGE  Final   Special Requests Immunocompromised  Final   Gram Stain   Final    FEW WBC PRESENT, PREDOMINANTLY MONONUCLEAR RARE  SQUAMOUS EPITHELIAL CELLS PRESENT NO ORGANISMS SEEN Performed at [place] Performed at [facility]  Final   Culture   Final    NO GROWTH 2 DAYS Performed at [facility]   Report Status 02/06/2014 FINAL  Final  AFB culture with smear     Status: None (Preliminary result)   Collection Time: 02/06/14  6:14 PM  Result Value Ref Range Status   Specimen Description SPUTUM  Final   Special Requests NONE  Final   Acid Fast Smear   Final    1+ ACID FAST BACILLI SEEN REFER TO PREVIOUS CULTURE [id number] FOR CALLS Performed at [facility]    Culture   Final    CULTURE WILL BE EXAMINED FOR 6 WEEKS BEFORE ISSUING A FINAL REPORT Performed at [facility]    Report Status PENDING  Incomplete  Culture, blood (routine x 2)     Status: None (Preliminary result)   Collection Time: 02/07/14  6:26 PM  Result Value Ref Range Status   Specimen Description BLOOD LEFT ARM  Final   Special Requests BOTTLES DRAWN AEROBIC AND ANAEROBIC 10CC  Final   Culture  Setup Time   Final    02/08/2014 02:25 Performed at [facility]    Culture   Final           BLOOD CULTURE RECEIVED NO GROWTH TO DATE CULTURE WILL BE HELD FOR 5 DAYS BEFORE ISSUING A FINAL NEGATIVE REPORT Performed at [facility]    Report Status PENDING  Incomplete  Culture, blood (routine x 2)     Status: None (Preliminary result)   Collection Time: 02/07/14  6:32 PM  Result Value Ref Range Status   Specimen Description BLOOD BLOOD LEFT FOREARM  Final   Special Requests BOTTLES DRAWN AEROBIC ONLY 10CC  Final   Culture  Setup Time   Final    02/08/2014 02:25 Performed at [facility]    Culture   Final           BLOOD CULTURE RECEIVED NO GROWTH TO DATE CULTURE WILL BE HELD FOR 5 DAYS BEFORE ISSUING A FINAL NEGATIVE REPORT Performed at [facility]    Report Status PENDING  Incomplete    Anti-infectives    Start     Dose/Rate Route  Frequency Ordered Stop   02/09/14 1000  lamiVUDine (EPIVIR) 10 MG/ML solution 100 mg     100 mg Oral Daily 02/08/14 1149     02/08/14 1600  azithromycin (ZITHROMAX) tablet 1,200 mg  Status:  Discontinued     1,200 mg Per Tube Weekly 02/03/14 0955 02/03/14 1012   02/08/14 1300  abacavir (ZIAGEN) tablet 600 mg     600 mg Per NG tube Daily 02/08/14 1115     02/08/14 1200  lamiVUDine (EPIVIR) 10 MG/ML solution 150 mg     150 mg Per Tube  Once 02/08/14 1115 02/08/14 1320   02/08/14 1200  dolutegravir (TIVICAY) tablet 50 mg    Comments:  Per NG TUBE   50 mg Oral 2 times daily 02/08/14 1115     02/08/14 1000  azithromycin (ZITHROMAX) 200 MG/5ML suspension 1,200 mg  Status:  Discontinued     1,200 mg Oral Weekly 02/03/14 1013 02/03/14 1015   02/08/14 1000  azithromycin (ZITHROMAX) 200 MG/5ML suspension 1,200 mg  Status:  Discontinued     1,200 mg Per Tube Weekly 02/03/14 1015 02/05/14 1542   02/06/14 1200  acyclovir (ZOVIRAX) 470 mg in dextrose 5 % 100 mL IVPB     10 mg/kg  47 kg109.4 mL/hr over 60 Minutes Intravenous Every 24 hours 02/06/14 1110     02/05/14 2200  clindamycin (CLEOCIN) IVPB 600 mg     600 mg100 mL/hr over 30 Minutes Intravenous 3 times per day 02/05/14 1542     02/05/14 2200  primaquine tablet 30 mg     30 mg Per Tube Every 24 hours 02/05/14 1542     02/05/14 1700  azithromycin (ZITHROMAX) 200 MG/5ML suspension 600 mg     600 mg Per Tube Daily 02/05/14 1542     02/05/14 1700  ethambutol (MYAMBUTOL) tablet 800 mg  Status:  Discontinued     800 mg Oral Every 24 hours 02/05/14 1542 02/05/14 1545   02/05/14 1700  ethambutol (MYAMBUTOL) tablet 800 mg     800 mg Per Tube Every 24 hours 02/05/14 1545     02/05/14 1600  rifabutin (MYCOBUTIN) capsule 150 mg     150 mg Oral Daily 02/05/14 1542     02/05/14 1200  acyclovir (ZOVIRAX) 450 mg in dextrose 5 % 100 mL IVPB  Status:  Discontinued     10 mg/kg  45 kg109 mL/hr over 60 Minutes Intravenous Every 24 hours 02/04/14 1554 02/04/14  1601   02/05/14 1200  acyclovir (ZOVIRAX) 400 mg in dextrose 5 % 100 mL IVPB  Status:  Discontinued     10 mg/kg  40 kg (Order-Specific)108 mL/hr over 60 Minutes Intravenous Every 24 hours 02/04/14 1601 02/06/14 1110   02/05/14 1200  sulfamethoxazole-trimethoprim (BACTRIM) 240 mg of trimethoprim in dextrose 5 % 250 mL IVPB  Status:  Discontinued     240 mg of trimethoprim265 mL/hr over 60 Minutes Intravenous Every 12 hours 02/05/14 1156 02/05/14 1510   02/05/14 1000  fluconazole (DIFLUCAN) 40 MG/ML suspension 100 mg     100 mg Per Tube Daily 02/04/14 1759     02/04/14 2000  piperacillin-tazobactam (ZOSYN) IVPB 2.25 g     2.25 g100 mL/hr over 30 Minutes Intravenous Every 8 hours 02/04/14 1613 02/04/14 2121   02/04/14 1100  ganciclovir (CYTOVENE) 55 mg in sodium chloride 0.9 % 100 mL IVPB  Status:  Discontinued     1.25 mg/kg  45 kg100 mL/hr over 60 Minutes Intravenous Every 24 hours 02/04/14 1007 02/04/14 1450   02/04/14 1000  fluconazole (DIFLUCAN) tablet 400 mg  Status:  Discontinued     400 mg Per Tube Daily 02/03/14 0955 02/03/14 1015   02/04/14 1000  fluconazole (DIFLUCAN) 40 MG/ML  suspension 400 mg  Status:  Discontinued     400 mg Per Tube Daily 02/03/14 1015 02/03/14 1136   02/04/14 1000  fluconazole (DIFLUCAN) 40 MG/ML suspension 200 mg  Status:  Discontinued     200 mg Per Tube Daily 02/03/14 1136 02/04/14 1759   02/04/14 1000  acyclovir (ZOVIRAX) 400 mg in dextrose 5 % 100 mL IVPB  Status:  Discontinued     10 mg/kg  40 kg108 mL/hr over 60 Minutes Intravenous Every 24 hours 02/03/14 1412 02/04/14 0952   02/04/14 0000  sulfamethoxazole-trimethoprim (BACTRIM) 200 mg of trimethoprim in dextrose 5 % 250 mL IVPB  Status:  Discontinued     5 mg/kg of trimethoprim  40 kg262.5 mL/hr over 60 Minutes Intravenous Every 12 hours 02/03/14 1412 02/05/14 1156   02/03/14 2000  piperacillin-tazobactam (ZOSYN) IVPB 2.25 g  Status:  Discontinued     2.25 g100 mL/hr over 30 Minutes Intravenous Every  8 hours 02/03/14 1412 02/04/14 1613   02/02/14 1400  sulfamethoxazole-trimethoprim (BACTRIM) 200 mg in dextrose 5 % 250 mL IVPB  Status:  Discontinued     15 mg/kg/day  40 kg262.5 mL/hr over 60 Minutes Intravenous 3 times per day 02/02/14 1354 02/03/14 1412   02/02/14 1300  fluconazole (DIFLUCAN) tablet 400 mg  Status:  Discontinued     400 mg Oral Daily 02/02/14 1026 02/03/14 0955   02/01/14 1700  sulfamethoxazole-trimethoprim (BACTRIM DS) 800-160 MG per tablet 1 tablet  Status:  Discontinued     1 tablet Oral Once per day on Mon Wed Fri 02/01/14 1549 02/02/14 1316   02/01/14 1600  azithromycin (ZITHROMAX) tablet 1,200 mg  Status:  Discontinued     1,200 mg Oral Weekly 02/01/14 1515 02/03/14 0955   02/01/14 1000  acyclovir (ZOVIRAX) 420 mg in dextrose 5 % 100 mL IVPB  Status:  Discontinued     10 mg/kg  41.9 kg108.4 mL/hr over 60 Minutes Intravenous Every 12 hours 02/01/14 0909 02/03/14 1412   02/01/14 1000  vancomycin (VANCOCIN) IVPB 750 mg/150 ml premix  Status:  Discontinued     750 mg150 mL/hr over 60 Minutes Intravenous Every 24 hours 02/01/14 0910 02/03/14 0910   02/01/14 0823  vancomycin (VANCOCIN) 500 mg in sodium chloride 0.9 % 100 mL IVPB  Status:  Discontinued     500 mg100 mL/hr over 60 Minutes Intravenous Every 24 hours 01/31/14 1029 02/01/14 0909   01/31/14 1200  piperacillin-tazobactam (ZOSYN) IVPB 3.375 g  Status:  Discontinued     3.375 g12.5 mL/hr over 240 Minutes Intravenous Every 8 hours 01/31/14 1029 02/03/14 1412   01/30/14 2100  acyclovir (ZOVIRAX) 390 mg in dextrose 5 % 100 mL IVPB  Status:  Discontinued     10 mg/kg  39.1 kg107.8 mL/hr over 60 Minutes Intravenous Every 24 hours 01/30/14 2018 02/01/14 0908   01/29/14 1600  acyclovir (ZOVIRAX) 420 mg in dextrose 5 % 100 mL IVPB  Status:  Discontinued     420 mg108.4 mL/hr over 60 Minutes Intravenous Every 24 hours 01/29/14 1457 01/29/14 2015   01/29/14 0800  cefTRIAXone (ROCEPHIN) 1 g in dextrose 5 % 50 mL IVPB   Status:  Discontinued     1 g100 mL/hr over 30 Minutes Intravenous Every 24 hours 01/29/14 0707 01/29/14 0710   01/29/14 0800  vancomycin (VANCOCIN) 500 mg in sodium chloride 0.9 % 100 mL IVPB  Status:  Discontinued     500 mg100 mL/hr over 60 Minutes Intravenous Every 48 hours 01/29/14 0719 01/31/14  1029   01/29/14 0730  piperacillin-tazobactam (ZOSYN) IVPB 2.25 g  Status:  Discontinued     2.25 g100 mL/hr over 30 Minutes Intravenous 4 times per day 01/29/14 0719 01/31/14 1028   01/29/14 0715  azithromycin (ZITHROMAX) 500 mg in dextrose 5 % 250 mL IVPB  Status:  Discontinued     500 mg250 mL/hr over 60 Minutes Intravenous Every 24 hours 01/29/14 0704 01/29/14 0710      Assessment: 56 yoF with newly diagnosed HIV. 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  ID is following. On Abacavir, Tivicay, and Epivir for HIV.  Her SCr peaked on 11/3 at 3.78 and has since trended down to 3.31. Urine output is stable at 0.8. Weight has increased about 9 lbs. But is likely due to fluid retention.  No medication adjustments needed today.   Goal of Therapy:  Appropriate antibiotic dosing Clinical resolution of infection  Plan:  Diflucan 152m PT q24h (thrush) Acyclovir [id number] (HSV) Clinda 60016mV q8h/Primaquine 43m46m qd (PCP) Azithro to 600mg77mdaily (MAC) Ethambutol 800mg 22maily (MAC) Rifabutin 150mg P44mily (MAC) Abacavir 600 mg, Tivicay 50 mg, Epivir 100 mg (HIV) Monitor micro data, weight and renal function and adjust medications as needed  Jakaila Norment Theron AristaD Clinical Pharmacist - Resident Pager: [phone] PM

## 2014-02-11 NOTE — Progress Notes (Signed)
Patient ID: Desiree Hale, female    DOB: Feb 21, 1971, 43 y.o.   MRN: [id number] Nephrology Progress Note  S: Patient reports she is doing okay. She is intubated. Plans to hopefully extubate soon per primary notes, although currently not ready.  O:BP 156/85 mmHg  Pulse 99  Temp(Src) 97.7 F (36.5 C) (Oral)  Resp 18  Ht 5\' 7"  (1.702 m)  Wt 123 lb 14.4 oz (56.2 kg)  BMI 19.40 kg/m2  SpO2 100%  LMP   Intake/Output Summary (Last 24 hours) at 02/11/14 0629 Last data filed at 02/11/14 0507  Gross per 24 hour  Intake 3159.5 ml  Output   1800 ml  Net 1359.5 ml   Intake/Output: I/O last 3 completed shifts: In: 5388.5 [I.V.:2309.5; NG/GT:2370; IV Piggyback:709] Out: 2575 [Urine:1475; Stool:1100]  Intake/Output this shift:    Weight change: 7.1 oz (0.2 kg)  Gen: Laying in bed, slow to arouse, but eventually responding to questions Intubated/feeding tube HEENT: Left IJ in place. No JVD CVS: Regular rate and rhythm Resp: Clear on anterior auscultation Abd: Soft Ext: No edema   Recent Labs Lab 02/05/14 0226 02/06/14 0500 02/07/14 0400 02/08/14 0443 02/08/14 1704 02/09/14 0400 02/10/14 0433 02/10/14 0700 02/11/14 0400  NA 133* 135* 137 139 140 139 143  --  145  K 3.9 4.2 4.8 5.5* 5.2 5.1 5.6*  --  5.0  CL 102 104 106 107 109 106 107  --  104  CO2 14* 15* 16* 13* 14* 17* 22  --  23  GLUCOSE 149* 196* 194* 151* 148* 221* 128*  --  156*  BUN 43* 49* 62* 76* 85* 87* 91*  --  95*  CREATININE 2.83* 2.74* 3.26* 3.51* 3.79* 3.78* 3.57*  --  3.31*  ALBUMIN 1.4* 1.3*  --  1.7* 1.6* 1.6*  --   --  2.0*  CALCIUM 8.1* 7.7* 8.1* 8.5 8.3* 8.3* 8.7  --  8.7  PHOS 2.8 4.6 4.9* 5.5* 6.9* 7.1*  --  7.9* 7.4*  AST 12 <5  --  25  --   --   --   --   --   ALT <5 <5  --  28  --   --   --   --   --    Liver Function Tests:  Recent Labs Lab 02/05/14 0226 02/06/14 0500 02/08/14 0443 02/08/14 1704 02/09/14 0400 02/11/14 0400  AST 12 <5 25  --   --   --   ALT <5 <5 28  --   --   --    ALKPHOS 87 77 82  --   --   --   BILITOT 0.3 <0.2* <0.2*  --   --   --   PROT 5.8* 5.6* 6.4  --   --   --   ALBUMIN 1.4* 1.3* 1.7* 1.6* 1.6* 2.0*   No results for input(s): LIPASE, AMYLASE in the last 168 hours. No results for input(s): AMMONIA in the last 168 hours. CBC:  Recent Labs Lab 02/07/14 0400 02/08/14 0443 02/09/14 0400 02/10/14 0433 02/11/14 0400  WBC 11.5* 14.9* 8.3 9.6 10.8*  NEUTROABS 10.9*  --   --   --   --   HGB 8.3* 9.6* 8.0* 8.8* 9.9*  HCT 25.0* 28.6* 23.6* 26.6* 30.1*  MCV 76.7* 75.7* 76.6* 76.4* 78.8  PLT 289 364 290 338 405*   Cardiac Enzymes: No results for input(s): CKTOTAL, CKMB, CKMBINDEX, TROPONINI in the last 168 hours. CBG:  Recent Labs Lab 02/10/14 1203  02/10/14 1557 02/10/14 1911 02/11/14 0043 02/11/14 0358  GLUCAP 135* 165* 167* 163* 153*    Iron Studies: No results for input(s): IRON, TIBC, TRANSFERRIN, FERRITIN in the last 72 hours. Studies/Results: Dg Chest Port 1 View  02/10/2014   CLINICAL DATA:  Check endotracheal tube  EXAM: PORTABLE CHEST - 1 VIEW  COMPARISON:  02/08/2014  FINDINGS: Cardiac shadow is stable. A feeding catheter and left jugular central line are again seen and stable. An endotracheal tube is noted now all at 2.6 cm above the carina. This appears to have been advanced slightly in the interval from the prior exam. The lungs are well aerated without focal infiltrate  IMPRESSION: Tubes and lines as described above.  Interval clearing of the changes in the right lung base medially. No focal infiltrate is seen.   Electronically Signed   By: Alcide CleverMark  Lukens M.D.   On: 02/10/2014 08:01   Medications: . sodium chloride   Intravenous Once  . abacavir  600 mg Per NG tube Daily  . acyclovir  10 mg/kg Intravenous Q24H  . antiseptic oral rinse  7 mL Mouth Rinse QID  . aspirin  81 mg Oral Daily  . azithromycin  600 mg Per Tube Daily  . chlorhexidine  15 mL Mouth Rinse BID  . clindamycin (CLEOCIN) IV  600 mg Intravenous 3 times  per day  . dolutegravir  50 mg Oral BID  . ethambutol  800 mg Per Tube Q24H  . feeding supplement (PRO-STAT SUGAR FREE 64)  30 mL Per Tube Daily  . fluconazole  100 mg Per Tube Daily  . heparin subcutaneous  5,000 Units Subcutaneous 3 times per day  . labetalol  100 mg Oral BID  . lamiVUDine  100 mg Oral Daily  . levETIRAcetam  500 mg Intravenous Q12H  . methylPREDNISolone (SOLU-MEDROL) injection  40 mg Intravenous Q12H  . pantoprazole sodium  40 mg Per Tube Daily  . primaquine  30 mg Per Tube Q24H  . rifabutin  150 mg Oral Daily  Infusions . dextrose 5 % 1,000 mL with sodium bicarbonate 150 mEq infusion 50 mL/hr at 02/09/14 1553  . feeding supplement (JEVITY 1.2 CAL) 1,000 mL (02/11/14 0410)   Results for [name], Desiree (MRN [id number]) as of 02/09/2014 12:21  Ref. 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 She was admitted with an initial creatinine of 3.07 with a transient improvement to 1.45, with progressive worsening since 10/29 and appears to be stabilizing, currently at 3.31 from a peak of 3.78. She has been exposed to many antiinfectives (some that are nephrotoxic agents) in addition to having newly diagnosed HIV/AIDs that is most likely a contributing factor. Patient encephalopathy appears to be improving along with her kidney function.  Assessment/Plan: 1. AKI with metabolic acidosis: Appears to be multifactorial as patient arrived with kidney injury but has also received many nephrotoxic agents . Concern patient may have HIVAN vs HIVICK as a background cause for CKD with possible superimposedAIN vs ATN. Relative hypotension (big swings in systolic BP) could also have played  a part in patient's worsening  kidney function (renal ischemia) . Phosphorus is improving; will not start phosphate binders since patient has normal corrected calcium and is intubated (would need calcium based binder  and corrected Ca normal). Patient has proteinuria with granular casts on urinalysis. UPC 550 mg (non-nephrotic). Renal ultrasound negative shows echodense kidneys. Will continue isotonic sodium bicarbonate at 50 cc/hour and continue to monitor labs. Urine output adequate (1L/24hr). ANA and anti-DNA ab wnl. C3 and C4 wnl. Urinalysis wnl with elevated protein/creatinine of 0.55. 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 Marland Kitchen. Unstable for renal biopsy, which will be needed to confirm pathology in the future unless full return to normal renal function. 2. Hypertension: patient currently on labetalol. 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 Continues to improve 4. HIV/AIDs: recently diagnosed. Unsure if this is a known diagnosis by patient. HIV therapy started 11/2. Will need to be cautious as to not cause further kidney injury. CMV pending. Sputum culture pending (AFB positive) 5. PCP pneumonia: management per primary and infectious disease 6. Diabetes mellitus: listed in history, although no documented medications  Jacquelin Hawkingalph Nettey, MD PGY-2, [facility] 02/11/2014, 6:29 AM   I have seen and examined this patient and agree with plan and assessment in the above note by Dr. Caleb PoppNettey.  Nothing to add to his findings.Camille Bal. Victoria Henshaw B,MD 02/11/2014 9:55 AM

## 2014-02-11 NOTE — Progress Notes (Signed)
[facility]    Day 14 Acyclovir DAy 7 IV clinda/primaquin (day 6 pcp tx) Day 7 emb/rifabutin/azithro  Day 43fuconazole   Zosyn D/cd after 7 days    Subjective: oN vent, alert nodding/shaking  head to questions   Antibiotics:  Anti-infectives    Start     Dose/Rate Route Frequency Ordered Stop   02/09/14 1000  lamiVUDine (EPIVIR) 10 MG/ML solution 100 mg     100 mg Oral Daily 02/08/14 1149     02/08/14 1600  azithromycin (ZITHROMAX) tablet 1,200 mg  Status:  Discontinued     1,200 mg Per Tube Weekly 02/03/14 0955 02/03/14 1012   02/08/14 1300  abacavir (ZIAGEN) tablet 600 mg     600 mg Per NG tube Daily 02/08/14 1115     02/08/14 1200  lamiVUDine (EPIVIR) 10 MG/ML solution 150 mg     150 mg Per Tube  Once 02/08/14 1115 02/08/14 1320   02/08/14 1200  dolutegravir (TIVICAY) tablet 50 mg    Comments:  Per NG TUBE   50 mg Oral 2 times daily 02/08/14 1115     02/08/14 1000  azithromycin (ZITHROMAX) 200 MG/5ML suspension 1,200 mg  Status:  Discontinued     1,200 mg Oral Weekly 02/03/14 1013 02/03/14 1015   02/08/14 1000  azithromycin (ZITHROMAX) 200 MG/5ML suspension 1,200 mg  Status:  Discontinued     1,200 mg Per Tube Weekly 02/03/14 1015 02/05/14 1542   02/06/14 1200  acyclovir (ZOVIRAX) 470 mg in dextrose 5 % 100 mL IVPB     10 mg/kg  47 kg109.4 mL/hr over 60 Minutes Intravenous Every 24 hours 02/06/14 1110     02/05/14 2200  clindamycin (CLEOCIN) IVPB 600 mg     600 mg100 mL/hr over 30 Minutes Intravenous 3 times per day 02/05/14 1542     02/05/14 2200  primaquine tablet 30 mg     30 mg Per Tube Every 24 hours 02/05/14 1542     02/05/14 1700  azithromycin (ZITHROMAX) 200 MG/5ML suspension 600 mg     600 mg Per Tube Daily 02/05/14 1542     02/05/14 1700  ethambutol (MYAMBUTOL) tablet 800 mg  Status:   Discontinued     800 mg Oral Every 24 hours 02/05/14 1542 02/05/14 1545   02/05/14 1700  ethambutol (MYAMBUTOL) tablet 800 mg     800 mg Per Tube Every 24 hours 02/05/14 1545     02/05/14 1600  rifabutin (MYCOBUTIN) capsule 150 mg     150 mg Oral Daily 02/05/14 1542     02/05/14 1200  acyclovir (ZOVIRAX) 450 mg in dextrose 5 % 100 mL IVPB  Status:  Discontinued     10 mg/kg  45 kg109 mL/hr over 60 Minutes Intravenous Every 24 hours 02/04/14 1554 02/04/14 1601   02/05/14 1200  acyclovir (ZOVIRAX) 400 mg in dextrose 5 % 100 mL IVPB  Status:  Discontinued     10 mg/kg  40 kg (Order-Specific)108 mL/hr over 60 Minutes Intravenous Every 24 hours 02/04/14 1601 02/06/14 1110   02/05/14 1200  sulfamethoxazole-trimethoprim (BACTRIM) 240 mg of trimethoprim in dextrose 5 % 250 mL IVPB  Status:  Discontinued     240 mg of trimethoprim265 mL/hr over 60 Minutes Intravenous Every 12 hours 02/05/14 1156 02/05/14 1510   02/05/14 1000  fluconazole (DIFLUCAN) 40 MG/ML suspension 100 mg     100 mg Per Tube Daily 02/04/14 1759     02/04/14 2000  piperacillin-tazobactam (ZOSYN)  IVPB 2.25 g     2.25 g100 mL/hr over 30 Minutes Intravenous Every 8 hours 02/04/14 1613 02/04/14 2121   02/04/14 1100  ganciclovir (CYTOVENE) 55 mg in sodium chloride 0.9 % 100 mL IVPB  Status:  Discontinued     1.25 mg/kg  45 kg100 mL/hr over 60 Minutes Intravenous Every 24 hours 02/04/14 1007 02/04/14 1450   02/04/14 1000  fluconazole (DIFLUCAN) tablet 400 mg  Status:  Discontinued     400 mg Per Tube Daily 02/03/14 0955 02/03/14 1015   02/04/14 1000  fluconazole (DIFLUCAN) 40 MG/ML suspension 400 mg  Status:  Discontinued     400 mg Per Tube Daily 02/03/14 1015 02/03/14 1136   02/04/14 1000  fluconazole (DIFLUCAN) 40 MG/ML suspension 200 mg  Status:  Discontinued     200 mg Per Tube Daily 02/03/14 1136 02/04/14 1759   02/04/14 1000  acyclovir (ZOVIRAX) 400 mg in dextrose 5 % 100 mL IVPB  Status:  Discontinued     10 mg/kg  40 kg108  mL/hr over 60 Minutes Intravenous Every 24 hours 02/03/14 1412 02/04/14 0952   02/04/14 0000  sulfamethoxazole-trimethoprim (BACTRIM) 200 mg of trimethoprim in dextrose 5 % 250 mL IVPB  Status:  Discontinued     5 mg/kg of trimethoprim  40 kg262.5 mL/hr over 60 Minutes Intravenous Every 12 hours 02/03/14 1412 02/05/14 1156   02/03/14 2000  piperacillin-tazobactam (ZOSYN) IVPB 2.25 g  Status:  Discontinued     2.25 g100 mL/hr over 30 Minutes Intravenous Every 8 hours 02/03/14 1412 02/04/14 1613   02/02/14 1400  sulfamethoxazole-trimethoprim (BACTRIM) 200 mg in dextrose 5 % 250 mL IVPB  Status:  Discontinued     15 mg/kg/day  40 kg262.5 mL/hr over 60 Minutes Intravenous 3 times per day 02/02/14 1354 02/03/14 1412   02/02/14 1300  fluconazole (DIFLUCAN) tablet 400 mg  Status:  Discontinued     400 mg Oral Daily 02/02/14 1026 02/03/14 0955   02/01/14 1700  sulfamethoxazole-trimethoprim (BACTRIM DS) 800-160 MG per tablet 1 tablet  Status:  Discontinued     1 tablet Oral Once per day on Mon Wed Fri 02/01/14 1549 02/02/14 1316   02/01/14 1600  azithromycin (ZITHROMAX) tablet 1,200 mg  Status:  Discontinued     1,200 mg Oral Weekly 02/01/14 1515 02/03/14 0955   02/01/14 1000  acyclovir (ZOVIRAX) 420 mg in dextrose 5 % 100 mL IVPB  Status:  Discontinued     10 mg/kg  41.9 kg108.4 mL/hr over 60 Minutes Intravenous Every 12 hours 02/01/14 0909 02/03/14 1412   02/01/14 1000  vancomycin (VANCOCIN) IVPB 750 mg/150 ml premix  Status:  Discontinued     750 mg150 mL/hr over 60 Minutes Intravenous Every 24 hours 02/01/14 0910 02/03/14 0910   02/01/14 0823  vancomycin (VANCOCIN) 500 mg in sodium chloride 0.9 % 100 mL IVPB  Status:  Discontinued     500 mg100 mL/hr over 60 Minutes Intravenous Every 24 hours 01/31/14 1029 02/01/14 0909   01/31/14 1200  piperacillin-tazobactam (ZOSYN) IVPB 3.375 g  Status:  Discontinued     3.375 g12.5 mL/hr over 240 Minutes Intravenous Every 8 hours 01/31/14 1029 02/03/14 1412    01/30/14 2100  acyclovir (ZOVIRAX) 390 mg in dextrose 5 % 100 mL IVPB  Status:  Discontinued     10 mg/kg  39.1 kg107.8 mL/hr over 60 Minutes Intravenous Every 24 hours 01/30/14 2018 02/01/14 0908   01/29/14 1600  acyclovir (ZOVIRAX) 420 mg in dextrose 5 % 100 mL  IVPB  Status:  Discontinued     420 mg108.4 mL/hr over 60 Minutes Intravenous Every 24 hours 01/29/14 1457 01/29/14 2015   01/29/14 0800  cefTRIAXone (ROCEPHIN) 1 g in dextrose 5 % 50 mL IVPB  Status:  Discontinued     1 g100 mL/hr over 30 Minutes Intravenous Every 24 hours 01/29/14 0707 01/29/14 0710   01/29/14 0800  vancomycin (VANCOCIN) 500 mg in sodium chloride 0.9 % 100 mL IVPB  Status:  Discontinued     500 mg100 mL/hr over 60 Minutes Intravenous Every 48 hours 01/29/14 0719 01/31/14 1029   01/29/14 0730  piperacillin-tazobactam (ZOSYN) IVPB 2.25 g  Status:  Discontinued     2.25 g100 mL/hr over 30 Minutes Intravenous 4 times per day 01/29/14 0719 01/31/14 1028   01/29/14 0715  azithromycin (ZITHROMAX) 500 mg in dextrose 5 % 250 mL IVPB  Status:  Discontinued     500 mg250 mL/hr over 60 Minutes Intravenous Every 24 hours 01/29/14 0704 01/29/14 0710      Medications: Scheduled Meds: . sodium chloride   Intravenous Once  . abacavir  600 mg Per NG tube Daily  . acyclovir  10 mg/kg Intravenous Q24H  . antiseptic oral rinse  7 mL Mouth Rinse QID  . aspirin  81 mg Oral Daily  . azithromycin  600 mg Per Tube Daily  . chlorhexidine  15 mL Mouth Rinse BID  . clindamycin (CLEOCIN) IV  600 mg Intravenous 3 times per day  . dolutegravir  50 mg Oral BID  . ethambutol  800 mg Per Tube Q24H  . feeding supplement (PRO-STAT SUGAR FREE 64)  30 mL Per Tube Daily  . fluconazole  100 mg Per Tube Daily  . heparin subcutaneous  5,000 Units Subcutaneous 3 times per day  . labetalol  200 mg Oral BID  . lamiVUDine  100 mg Oral Daily  . levETIRAcetam  500 mg Intravenous Q12H  . methylPREDNISolone (SOLU-MEDROL) injection  40 mg Intravenous Q12H    . pantoprazole sodium  40 mg Per Tube Daily  . primaquine  30 mg Per Tube Q24H  . rifabutin  150 mg Oral Daily   Continuous Infusions: . dextrose 5 % 1,000 mL with sodium bicarbonate 150 mEq infusion 50 mL/hr at 02/09/14 1553  . feeding supplement (JEVITY 1.2 CAL) 1,000 mL (02/11/14 0410)   PRN Meds:.acetaminophen (TYLENOL) oral liquid 160 mg/5 mL, fentaNYL, fentaNYL, fentaNYL, hydrALAZINE, metoprolol, ondansetron **OR** ondansetron (ZOFRAN) IV    Objective: Weight change: 7.1 oz (0.2 kg)  Intake/Output Summary (Last 24 hours) at 02/11/14 1431 Last data filed at 02/11/14 1200  Gross per 24 hour  Intake   2681 ml  Output   1550 ml  Net   1131 ml   Blood pressure 141/80, pulse 95, temperature 97.1 F (36.2 C), temperature source Oral, resp. rate 17, height 5' 7"  (1.702 m), weight 123 lb 14.4 oz (56.2 kg), SpO2 100 %. Temp:  [97.1 F (36.2 C)-98 F (36.7 C)] 97.1 F (36.2 C) (11/05 1246) Pulse Rate:  [84-113] 95 (11/05 1200) Resp:  [14-23] 17 (11/05 1200) BP: (140-200)/(79-109) 141/80 mmHg (11/05 1200) SpO2:  [93 %-100 %] 100 % (11/05 1200) FiO2 (%):  [30 %-40 %] 30 % (11/05 1302) Weight:  [123 lb 14.4 oz (56.2 kg)] 123 lb 14.4 oz (56.2 kg) (11/05 0409)  Physical Exam: General: MUCH MORE AWAKE AND ALERT AND NODDING HEAD TO QUESTIONS, restrained on ventilator HEENT: EOMI CVS tachycardical r,  no murmur rubs or gallops Chest:  coarse breath sounds bilaterally Abdomen: softnondistended, normal bowel sounds, Skin: no rashes Neuro: nonfocal  CBC:  CBC Latest Ref Rng 02/11/2014 02/10/2014 02/09/2014  WBC 4.0 - 10.5 K/uL 10.8(H) 9.6 8.3  Hemoglobin 12.0 - 15.0 g/dL 9.9(L) 8.8(L) 8.0(L)  Hematocrit 36.0 - 46.0 % 30.1(L) 26.6(L) 23.6(L)  Platelets 150 - 400 K/uL 405(H) 338 290     BMET  Recent Labs  02/10/14 0433 02/11/14 0400  NA 143 145  K 5.6* 5.0  CL 107 104  CO2 22 23  GLUCOSE 128* 156*  BUN 91* 95*  CREATININE 3.57* 3.31*  CALCIUM 8.7 8.7     Liver  Panel   Recent Labs  02/09/14 0400 02/11/14 0400  ALBUMIN 1.6* 2.0*       Sedimentation Rate No results for input(s): ESRSEDRATE in the last 72 hours. C-Reactive Protein No results for input(s): CRP in the last 72 hours.  Micro Results: Recent Results (from the past 720 hour(s))  Urine culture     Status: None   Collection Time: 01/27/14  3:26 PM  Result Value Ref Range Status   Specimen Description URINE, CATHETERIZED  Final   Special Requests NONE  Final   Culture  Setup Time   Final    01/27/2014 22:22 Performed at [place] Performed at [facility]  Final   Culture NO GROWTH Performed at [facility]  Final   Report Status 01/28/2014 FINAL  Final  Culture, blood (routine x 2)     Status: None   Collection Time: 01/29/14  9:19 AM  Result Value Ref Range Status   Specimen Description BLOOD LEFT ARM  Final   Special Requests BOTTLES DRAWN AEROBIC AND ANAEROBIC 10CC  Final   Culture  Setup Time   Final    01/29/2014 15:04 Performed at [place]   Final    NO GROWTH 5 DAYS Performed at [facility]   Report Status 02/04/2014 FINAL  Final  Culture, blood (routine x 2)     Status: None   Collection Time: 01/29/14  9:30 AM  Result Value Ref Range Status   Specimen Description BLOOD LEFT HAND  Final   Special Requests BOTTLES DRAWN AEROBIC AND ANAEROBIC 5CC  Final   Culture  Setup Time   Final    01/29/2014 15:04 Performed at [place]   Final    NO GROWTH 5 DAYS Performed at [facility]   Report Status 02/04/2014 FINAL  Final  Clostridium Difficile by PCR     Status: None   Collection Time: 01/29/14 11:10 AM  Result Value Ref Range Status   C difficile by pcr NEGATIVE NEGATIVE Final  MRSA PCR Screening     Status: Abnormal   Collection Time: 01/29/14  3:40 PM  Result Value Ref Range Status   MRSA by PCR POSITIVE (A) NEGATIVE Final    Comment:         The GeneXpert MRSA Assay (FDA approved for NASAL specimens only), is one component of a comprehensive MRSA colonization surveillance program. It is not intended to diagnose MRSA infection nor to guide or monitor treatment for MRSA infections. RESULT CALLED TO, READ BACK BY AND VERIFIED WITH: Sherry Ruffing RN 17:50 01/29/14 ([email or web address])  AFB culture, blood     Status: None (Preliminary result)   Collection Time: 01/30/14 11:13 AM  Result Value Ref Range Status   Specimen Description BLOOD LEFT  ARM  Final   Special Requests BOTTLES DRAWN AEROBIC ONLY [place]  Final   Culture   Final    CULTURE WILL BE EXAMINED FOR 6 WEEKS BEFORE ISSUING A FINAL REPORT Performed at [facility]   Report Status PENDING  Incomplete  GC/Chlamydia Probe Amp     Status: None   Collection Time: 01/30/14 11:30 AM  Result Value Ref Range Status   CT Probe RNA NEGATIVE NEGATIVE Final   GC Probe RNA NEGATIVE NEGATIVE Final    Comment: (NOTE)                                                                                       **Normal Reference Range: Negative**      Assay performed using the Gen-Probe APTIMA COMBO2 (R) Assay. Acceptable specimen types for this assay include APTIMA Swabs (Unisex, endocervical, urethral, or vaginal), first void urine, and ThinPrep liquid based cytology samples. Performed at [facility]  CSF culture     Status: None   Collection Time: 01/30/14  5:01 PM  Result Value Ref Range Status   Specimen Description CSF  Final   Special Requests NONE  Final   Gram Stain   Final    CYTOSPIN SLIDE WBC PRESENT, PREDOMINANTLY MONONUCLEAR NO ORGANISMS SEEN Performed at [facility] Performed at [facility]   Culture   Final    NO GROWTH 3 DAYS Performed at [facility]   Report Status 02/03/2014 FINAL  Final  Gram stain     Status: None   Collection Time: 01/30/14  5:01 PM  Result Value Ref Range Status   Specimen Description CSF  Final   Special  Requests NONE  Final   Gram Stain   Final    CYTOSPIN SLIDE WBC PRESENT, PREDOMINANTLY MONONUCLEAR NO ORGANISMS SEEN   Report Status 01/30/2014 FINAL  Final  Fungus culture, blood     Status: None   Collection Time: 02/01/14  2:00 PM  Result Value Ref Range Status   Specimen Description BLOOD LEFT HAND  Final   Special Requests BOTTLES DRAWN AEROBIC AND ANAEROBIC 10CC  Final   Culture   Final    NO GROWTH 7 DAYS Performed at [facility]    Report Status 02/09/2014 FINAL  Final  Culture, blood (routine x 2)     Status: None   Collection Time: 02/02/14 12:07 AM  Result Value Ref Range Status   Specimen Description BLOOD RIGHT HAND  Final   Special Requests BOTTLES DRAWN AEROBIC ONLY [facility]  Final   Culture  Setup Time   Final    02/02/2014 03:55 Performed at [place]   Final    NO GROWTH 5 DAYS Performed at [facility]    Report Status 02/08/2014 FINAL  Final  Culture, blood (routine x 2)     Status: None   Collection Time: 02/02/14 12:18 AM  Result Value Ref Range Status   Specimen Description BLOOD LEFT HAND  Final   Special Requests BOTTLES DRAWN AEROBIC AND ANAEROBIC [place]  Final   Culture  Setup Time   Final  02/02/2014 03:55 Performed at [place]   Final    NO GROWTH 5 DAYS Performed at [facility]    Report Status 02/08/2014 FINAL  Final  Pneumocystis smear by DFA     Status: None   Collection Time: 02/03/14  2:30 PM  Result Value Ref Range Status   Specimen Source-PJSRC SPUTUM  Final   Pneumocystis jiroveci Ag POSITIVE  Final    Comment: Performed at [place] RN 02/04/14 1444 BY [email or web address]  Culture, respiratory (NON-Expectorated)     Status: None   Collection Time: 02/04/14  3:50 AM  Result Value Ref Range Status   Specimen Description TRACHEAL ASPIRATE  Final   Special Requests Immunocompromised  Final   Gram Stain   Final    FEW WBC  PRESENT,BOTH PMN AND MONONUCLEAR RARE SQUAMOUS EPITHELIAL CELLS PRESENT NO ORGANISMS SEEN Performed at [facility]   Culture   Final    NO GROWTH 2 DAYS Performed at [facility]   Report Status 02/06/2014 FINAL  Final  AFB culture with smear     Status: None (Preliminary result)   Collection Time: 02/04/14  3:50 AM  Result Value Ref Range Status   Specimen Description TRACHEAL ASPIRATE  Final   Special Requests NONE  Final   Acid Fast Smear   Final    1+ ACID FAST BACILLI SEEN CRITICAL RESULT CALLED TO, READ BACK BY AND VERIFIED WITH: CIARFI 14:55 110/30/15 FUENG CRITICAL RESULT CALLED TO, READ BACK BY AND VERIFIED WITH: LEE ANN STIMPSON H.D. 10:30 02/08/14 FUENG Performed at [facility]    Culture   Final    CULTURE WILL BE EXAMINED FOR 6 WEEKS BEFORE ISSUING A FINAL REPORT Performed at [facility]    Report Status PENDING  Incomplete  Fungus Culture with Smear     Status: None (Preliminary result)   Collection Time: 02/04/14  3:50 AM  Result Value Ref Range Status   Specimen Description TRACHEAL ASPIRATE  Final   Special Requests NONE  Final   Fungal Smear   Final    NO YEAST OR FUNGAL ELEMENTS SEEN Performed at [facility]    Culture   Final    CANDIDA ALBICANS Performed at [facility]    Report Status PENDING  Incomplete  Respiratory virus panel     Status: None   Collection Time: 02/04/14  3:50 AM  Result Value Ref Range Status   Source - RVPAN TRACHEAL ASPIRATE  Final   Respiratory Syncytial Virus A NOT DETECTED  Final   Respiratory Syncytial Virus B NOT DETECTED  Final   Influenza A NOT DETECTED  Final   Influenza B NOT DETECTED  Final   Parainfluenza 1 NOT DETECTED  Final   Parainfluenza 2 NOT DETECTED  Final   Parainfluenza 3 NOT DETECTED  Final   Metapneumovirus NOT DETECTED  Final   Rhinovirus NOT DETECTED  Final   Adenovirus NOT DETECTED  Final   Influenza A H1 NOT DETECTED  Final   Influenza A H3 NOT  DETECTED  Final    Comment: (NOTE)       Normal Reference Range for each Analyte: NOT DETECTED Testing performed using the Luminex xTAG Respiratory Viral Panel test kit. The analytical performance characteristics of this assay have been determined by [facility].  The modifications have not been cleared or approved by the FDA. This assay has  been validated pursuant to the CLIA regulations and is used for clinical purposes. Performed at [facility], bal-quantitative     Status: None   Collection Time: 02/04/14 10:31 AM  Result Value Ref Range Status   Specimen Description BRONCHIAL ALVEOLAR LAVAGE  Final   Special Requests Immunocompromised  Final   Gram Stain   Final    FEW WBC PRESENT, PREDOMINANTLY MONONUCLEAR RARE SQUAMOUS EPITHELIAL CELLS PRESENT NO ORGANISMS SEEN Performed at [place] Performed at [facility]  Final   Culture   Final    NO GROWTH 2 DAYS Performed at [facility]   Report Status 02/06/2014 FINAL  Final  AFB culture with smear     Status: None (Preliminary result)   Collection Time: 02/06/14  6:14 PM  Result Value Ref Range Status   Specimen Description SPUTUM  Final   Special Requests NONE  Final   Acid Fast Smear   Final    1+ ACID FAST BACILLI SEEN REFER TO PREVIOUS CULTURE [id number] FOR CALLS Performed at [facility]    Culture   Final    CULTURE WILL BE EXAMINED FOR 6 WEEKS BEFORE ISSUING A FINAL REPORT Performed at [facility]    Report Status PENDING  Incomplete  Culture, blood (routine x 2)     Status: None (Preliminary result)   Collection Time: 02/07/14  6:26 PM  Result Value Ref Range Status   Specimen Description BLOOD LEFT ARM  Final   Special Requests BOTTLES DRAWN AEROBIC AND ANAEROBIC 10CC  Final   Culture  Setup Time   Final    02/08/2014 02:25 Performed at [facility]    Culture   Final           BLOOD CULTURE RECEIVED  NO GROWTH TO DATE CULTURE WILL BE HELD FOR 5 DAYS BEFORE ISSUING A FINAL NEGATIVE REPORT Performed at [facility]    Report Status PENDING  Incomplete  Culture, blood (routine x 2)     Status: None (Preliminary result)   Collection Time: 02/07/14  6:32 PM  Result Value Ref Range Status   Specimen Description BLOOD BLOOD LEFT FOREARM  Final   Special Requests BOTTLES DRAWN AEROBIC ONLY 10CC  Final   Culture  Setup Time   Final    02/08/2014 02:25 Performed at [facility]    Culture   Final           BLOOD CULTURE RECEIVED NO GROWTH TO DATE CULTURE WILL BE HELD FOR 5 DAYS BEFORE ISSUING A FINAL NEGATIVE REPORT Performed at [facility]    Report Status PENDING  Incomplete    Studies/Results: Dg Chest Port 1 View  02/11/2014   CLINICAL DATA:  Ventilated patient; history of HIV knee, acute respiratory failure and aspiration pneumonia  EXAM: PORTABLE CHEST - 1 VIEW  COMPARISON:  Portable chest x-ray of February 10, 2014.  FINDINGS: The lungs are well-expanded and clear. The heart and mediastinal structures are normal. There is no pleural effusion or pneumothorax. The endotracheal tube tip lies 5.1 cm above the crotch of the carina. The left internal jugular venous catheter tip projects over the mid to distal SVC. The feeding tube tip projects below the inferior margin of the image. The bony thorax is unremarkable.  IMPRESSION: There is no evidence of pneumonia nor other acute cardiopulmonary abnormality.   Electronically Signed   By: David  Martinique  On: 02/11/2014 07:48   Dg Chest Port 1 View  02/10/2014   CLINICAL DATA:  Check endotracheal tube  EXAM: PORTABLE CHEST - 1 VIEW  COMPARISON:  02/08/2014  FINDINGS: Cardiac shadow is stable. A feeding catheter and left jugular central line are again seen and stable. An endotracheal tube is noted now all at 2.6 cm above the carina. This appears to have been advanced slightly in the interval from the prior exam. The lungs are  well aerated without focal infiltrate  IMPRESSION: Tubes and lines as described above.  Interval clearing of the changes in the right lung base medially. No focal infiltrate is seen.   Electronically Signed   By: Inez Catalina M.D.   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  --continue acyclovir and aim for 21 days  #2 PCP Pneumonia: continue clindamycin Primaquine and steroids for 21 day course  #4 Renal failure :started ARV, renal failure likely multifactorial. Cr stabilizing, improving  #4 Fevers, diarrhea: empiric M avium drugs started, CMV colitis possible as well. CMV PCR  Blood negative   #5 HIV/AIDS: started her on ARV regimen  Abacavir liquid, Epivir liquid and Tivicay per tube  NOTE HER ENCEPHALOPATHY COULD BE IN PART DUE TO UNCONTROLLED HIV VIREMIA AND DIRECT CNS TOXICITY OF HIV VIRUS. COULD ALSO BE CONSIDERED INTO HER RENAL FAILURE AND PERSISTENT FEVER SO i THINK STARTING TREATMENT IS IMPERATIVE  #6 afb ON CULTURES FROM LUNGS LIKELY IS mYCOBACTERIUM AVIUM BUT pcr ARE PENDING  I  discussed with Luz Lex from [employer] and will alstoget MAvium PCR along with the MTB one  Dr. Linus Salmons is covering for me tomorrow and Dr. Megan Salon will be here this weekend. I will be back on Monday.   LOS: 15 days   Desiree Hale 02/11/2014, 2:31 PM

## 2014-02-11 NOTE — Progress Notes (Signed)
MTB PCR has been added to bronchial wash. Talked with Misty StanleyLisa in Microbiology to verify.

## 2014-02-11 NOTE — Progress Notes (Signed)
This NP continues to try to make contact with the patient's husband to facilitate a Palliative Medicine consult,  I have been unsuccessful up to this time.  I leave messages on # [phone] but get no return call. Will continue to try to contact.  Lorinda CreedMary Larach NP  Palliative Medicine Team Team Phone # [phone] Pager [phone]  Discussed with Derenda FennelBashira Nixon LCSW

## 2014-02-12 DIAGNOSIS — Z21 Asymptomatic human immunodeficiency virus [HIV] infection status: Secondary | ICD-10-CM

## 2014-02-12 LAB — RENAL FUNCTION PANEL
Albumin: 1.9 g/dL — ABNORMAL LOW (ref 3.5–5.2)
Anion gap: 16 — ABNORMAL HIGH (ref 5–15)
BUN: 99 mg/dL — AB (ref 6–23)
CHLORIDE: 103 meq/L (ref 96–112)
CO2: 26 mEq/L (ref 19–32)
Calcium: 8.8 mg/dL (ref 8.4–10.5)
Creatinine, Ser: 3.2 mg/dL — ABNORMAL HIGH (ref 0.50–1.10)
GFR calc Af Amer: 19 mL/min — ABNORMAL LOW (ref >90)
GFR calc non Af Amer: 17 mL/min — ABNORMAL LOW (ref >90)
GLUCOSE: 163 mg/dL — AB (ref 70–99)
Phosphorus: 7.7 mg/dL — ABNORMAL HIGH (ref 2.3–4.6)
Potassium: 5.3 mEq/L (ref 3.7–5.3)
Sodium: 145 mEq/L (ref 137–147)

## 2014-02-12 LAB — GLUCOSE, CAPILLARY
GLUCOSE-CAPILLARY: 133 mg/dL — AB (ref 70–99)
GLUCOSE-CAPILLARY: 143 mg/dL — AB (ref 70–99)
GLUCOSE-CAPILLARY: 162 mg/dL — AB (ref 70–99)
GLUCOSE-CAPILLARY: 174 mg/dL — AB (ref 70–99)
Glucose-Capillary: 133 mg/dL — ABNORMAL HIGH (ref 70–99)
Glucose-Capillary: 146 mg/dL — ABNORMAL HIGH (ref 70–99)
Glucose-Capillary: 141 mg/dL — ABNORMAL HIGH (ref 70–99)

## 2014-02-12 LAB — CBC
HEMATOCRIT: 26.3 % — AB (ref 36.0–46.0)
HEMOGLOBIN: 8.5 g/dL — AB (ref 12.0–15.0)
MCH: 25.2 pg — ABNORMAL LOW (ref 26.0–34.0)
MCHC: 32.3 g/dL (ref 30.0–36.0)
MCV: 78 fL (ref 78.0–100.0)
Platelets: 323 10*3/uL (ref 150–400)
RBC: 3.37 MIL/uL — ABNORMAL LOW (ref 3.87–5.11)
RDW: 18.2 % — ABNORMAL HIGH (ref 11.5–15.5)
WBC: 8 10*3/uL (ref 4.0–10.5)

## 2014-02-12 LAB — M. TUBERCULOSIS COMPLEX BY PCR: M TUBERCULOSISDIRECT: NOT DETECTED

## 2014-02-12 MED ORDER — CHLORHEXIDINE GLUCONATE 0.12 % MT SOLN
15.0000 mL | Freq: Two times a day (BID) | OROMUCOSAL | Status: DC
  Administered 2014-02-12 – 2014-02-13 (×2): 15 mL via OROMUCOSAL
  Filled 2014-02-12 (×2): qty 15

## 2014-02-12 MED ORDER — DEXTROSE-NACL 5-0.45 % IV SOLN
INTRAVENOUS | Status: DC
  Administered 2014-02-12 – 2014-02-13 (×2): via INTRAVENOUS

## 2014-02-12 MED ORDER — SODIUM CHLORIDE 0.45 % IV SOLN: INTRAVENOUS | Status: DC

## 2014-02-12 MED ORDER — CETYLPYRIDINIUM CHLORIDE 0.05 % MT LIQD: 7.0000 mL | Freq: Two times a day (BID) | OROMUCOSAL | Status: DC

## 2014-02-12 NOTE — Procedures (Signed)
Extubation Procedure Note  Patient Details:   Name: Desiree BeamsKristy Hale DOB: [birth year] MRN: [id number]   Airway Documentation:     Evaluation  O2 sats: stable throughout Complications: No apparent complications Patient did tolerate procedure well. Bilateral Breath Sounds: Rhonchi Suctioning: Oral, Airway Yes   Patient extubated to 4L nasal cannula.  Positive cuff leak.  No evidence of stridor.  Sats currently 100%.  Vitals are stable.  Incentive spirometry performedx3 with goal of 300.  RT will continue to monitor.   Ledell NossBrown, Ricco Dershem N 02/12/2014, 12:14 PM

## 2014-02-12 NOTE — Progress Notes (Signed)
Patient ID: Desiree BeamsKristy Hale, female    DOB: [birth year], 43 y.o.   MRN: [id number] Nephrology Progress Note  S: Patient complains of some chest pain today. States she remembers who I am, where she is and why she is here.  O:BP 160/90 mmHg  Pulse 79  Temp(Src) 98 F (36.7 C) (Oral)  Resp 14  Ht 5\' 7"  (1.702 m)  Wt 125 lb 10.6 oz (57 kg)  BMI 19.68 kg/m2  SpO2 100%  LMP   Intake/Output Summary (Last 24 hours) at 02/12/14 0636 Last data filed at 02/12/14 0600  Gross per 24 hour  Intake 3149.4 ml  Output   1860 ml  Net 1289.4 ml   Intake/Output: I/O last 3 completed shifts: In: 4831.4 [I.V.:2182; NG/GT:1940; IV Piggyback:709.4] Out: 3150 [Urine:1650; Stool:1500]  Intake/Output this shift:  Total I/O In: 1460 [I.V.:710; NG/GT:550; IV Piggyback:200] Out: 635 [Urine:635] Weight change: 1 lb 12.2 oz (0.8 kg)  Gen: Laying in bed, alert, Intubated/feeding tube and rectal tube CVP 1 HEENT: Left IJ in place. No JVD CVS: Regular rate and rhythm Resp: Clear on anterior auscultation Musculoskeletal: possible reproducible chest pain, as it is hard to communicate with her Abd: Soft Ext: No edema some pitting of posterior thighs and arms   Recent Labs Lab 02/06/14 0500 02/07/14 0400 02/08/14 0443 02/08/14 1704 02/09/14 0400 02/10/14 0433 02/10/14 0700 02/11/14 0400 02/12/14 0500  NA 135* 137 139 140 139 143  --  145 145  K 4.2 4.8 5.5* 5.2 5.1 5.6*  --  5.0 5.3  CL 104 106 107 109 106 107  --  104 103  CO2 15* 16* 13* 14* 17* 22  --  23 26  GLUCOSE 196* 194* 151* 148* 221* 128*  --  156* 163*  BUN 49* 62* 76* 85* 87* 91*  --  95* 99*  CREATININE 2.74* 3.26* 3.51* 3.79* 3.78* 3.57*  --  3.31* 3.20*  ALBUMIN 1.3*  --  1.7* 1.6* 1.6*  --   --  2.0* 1.9*  CALCIUM 7.7* 8.1* 8.5 8.3* 8.3* 8.7  --  8.7 8.8  PHOS 4.6 4.9* 5.5* 6.9* 7.1*  --  7.9* 7.4* 7.7*  AST <5  --  25  --   --   --   --   --   --   ALT <5  --  28  --   --   --   --   --   --    Liver Function Tests:  Recent  Labs Lab 02/06/14 0500 02/08/14 0443  02/09/14 0400 02/11/14 0400 02/12/14 0500  AST <5 25  --   --   --   --   ALT <5 28  --   --   --   --   ALKPHOS 77 82  --   --   --   --   BILITOT <0.2* <0.2*  --   --   --   --   PROT 5.6* 6.4  --   --   --   --   ALBUMIN 1.3* 1.7*  < > 1.6* 2.0* 1.9*  < > = values in this interval not displayed. No results for input(s): LIPASE, AMYLASE in the last 168 hours. No results for input(s): AMMONIA in the last 168 hours. CBC:  Recent Labs Lab 02/07/14 0400 02/08/14 0443 02/09/14 0400 02/10/14 0433 02/11/14 0400 02/12/14 0410  WBC 11.5* 14.9* 8.3 9.6 10.8* 8.0  NEUTROABS 10.9*  --   --   --   --   --  HGB 8.3* 9.6* 8.0* 8.8* 9.9* 8.5*  HCT 25.0* 28.6* 23.6* 26.6* 30.1* 26.3*  MCV 76.7* 75.7* 76.6* 76.4* 78.8 78.0  PLT 289 364 290 338 405* 323   Cardiac Enzymes: No results for input(s): CKTOTAL, CKMB, CKMBINDEX, TROPONINI in the last 168 hours. CBG:  Recent Labs Lab 02/11/14 1244 02/11/14 1657 02/11/14 2044 02/12/14 0040 02/12/14 0422  GLUCAP 150* 148* 143* 162* 133*    Iron Studies: No results for input(s): IRON, TIBC, TRANSFERRIN, FERRITIN in the last 72 hours. Studies/Results: Dg Chest Port 1 View  02/11/2014   CLINICAL DATA:  Ventilated patient; history of HIV knee, acute respiratory failure and aspiration pneumonia  EXAM: PORTABLE CHEST - 1 VIEW  COMPARISON:  Portable chest x-ray of February 10, 2014.  FINDINGS: The lungs are well-expanded and clear. The heart and mediastinal structures are normal. There is no pleural effusion or pneumothorax. The endotracheal tube tip lies 5.1 cm above the crotch of the carina. The left internal jugular venous catheter tip projects over the mid to distal SVC. The feeding tube tip projects below the inferior margin of the image. The bony thorax is unremarkable.  IMPRESSION: There is no evidence of pneumonia nor other acute cardiopulmonary abnormality.   Electronically Signed   By: David  [place]    On: 02/11/2014 07:48   Medications: . sodium chloride   Intravenous Once  . abacavir  600 mg Per NG tube Daily  . acyclovir  10 mg/kg Intravenous Q24H  . antiseptic oral rinse  7 mL Mouth Rinse QID  . aspirin  81 mg Oral Daily  . azithromycin  600 mg Per Tube Daily  . chlorhexidine  15 mL Mouth Rinse BID  . clindamycin (CLEOCIN) IV  600 mg Intravenous 3 times per day  . dolutegravir  50 mg Oral BID  . ethambutol  800 mg Per Tube Q24H  . feeding supplement (PRO-STAT SUGAR FREE 64)  30 mL Per Tube Daily  . fluconazole  100 mg Per Tube Daily  . heparin subcutaneous  5,000 Units Subcutaneous 3 times per day  . labetalol  200 mg Oral BID  . lamiVUDine  100 mg Oral Daily  . levETIRAcetam  500 mg Intravenous Q12H  . methylPREDNISolone (SOLU-MEDROL) injection  40 mg Intravenous Q12H  . pantoprazole sodium  40 mg Per Tube Daily  . primaquine  30 mg Per Tube Q24H  . rifabutin  150 mg Oral Daily  Infusions . dextrose 5 % 1,000 mL with sodium bicarbonate 150 mEq infusion 50 mL/hr at 02/09/14 1553  . feeding supplement (JEVITY 1.2 CAL) 1,000 mL (02/12/14 0049)   Results for Desiree BeamsKING, [name] (MRN [id number]) as of 02/09/2014 12:21  Ref. 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 She was admitted with an initial creatinine of 3.07 with a transient improvement to 1.45, with progressive worsening since 10/29 and appears to be stabilizing, currently at 3.31 from a peak of 3.78. She has been exposed to many antiinfectives (some that are nephrotoxic agents) in addition to having newly diagnosed HIV/AIDs that is most likely a contributing factor. Weight on admission of 88lbs improved to 125lbs. Patient encephalopathy appears to be  improving along with her kidney function, although slowly.  Assessment/Plan: 1. AKI with metabolic acidosis: Appears  to be multifactorial as patient arrived with kidney injury but has also received many nephrotoxic agents . Concern patient may have HIVAN vs HIVICK as a background cause for CKD with possible superimposedAIN vs ATN. Relative hypotension (big swings in systolic BP) could also have played  a part in patient's worsening kidney function (renal ischemia). Patient has proteinuria with granular casts on urinalysis. UPC 550 mg (non-nephrotic). Renal ultrasound negative shows echodense kidneys. Urine output adequate (1.3L/24hr). ANA and anti-DNA ab wnl. C3 and C4 wnl. Urinalysis wnl with elevated protein/creatinine of 0.55. 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 Unstable for renal biopsy, which will be needed to confirm pathology in the future unless full return to normal renal function. Creatinine continues to improve slowly. Acidosis has corrected. Recommend continuing to trend renal function. Will  discontinuing isotonic bicarbonate and change to D51/2NS (CVP2/Na has drifted up into the 147 range). Continue to follow labs. 2. Hyperphosphatemia: has been consistently elevated. Will not give phosphate binders since patient is intubated and corrected calcium is borderline high (10.5) as she would need calcium based binder. 3. Hypertension: patient currently on labetalol. 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 Continues to improve 5. HIV/AIDs: recently diagnosed. Unsure if this is a known diagnosis by patient. HIV therapy started 11/2. Will need to be cautious as to not cause further kidney injury. CMV negative. M. tuberculosis negative. 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 6. PCP pneumonia: management per primary and infectious disease 7. Diabetes mellitus: listed in history, although  no documented medications. Per primary  Jacquelin Hawking, MD PGY-2, [facility] 02/12/2014, 6:36 AM   I have seen and examined this patient and agree with plan and assessment in the note by Dr. Caleb Popp with highlighted additions. Creatinine slowly trending down. CVP low but 3rd spacing now with some low oncotic pressure edema (thighs, arms). Acidosis corrected. Stop drip (if acidosis worsens off the drop can add some bicarb tabs via tube). Will continue to follow. Harini Dearmond B,MD 02/12/2014 9:50 AM

## 2014-02-12 NOTE — Plan of Care (Signed)
Problem: Phase I Progression Outcomes Goal: GIProphysixis Outcome: Completed/Met Date Met:  02/12/14 Goal: Oral Care per Protocol Outcome: Completed/Met Date Met:  02/12/14 Goal: HOB elevated 30 degrees Outcome: Completed/Met Date Met:  02/12/14 Goal: VAP prevention protocol initiated Outcome: Completed/Met Date Met:  02/12/14 Goal: Sedation Protocol initiated if indicated Outcome: Not Applicable Date Met:  70/34/03 Goal: Pain controlled with appropriate interventions Outcome: Completed/Met Date Met:  02/12/14 Goal: Hemodynamically stable Outcome: Completed/Met Date Met:  02/12/14 Goal: Baseline oxygen/pH stable Outcome: Completed/Met Date Met:  02/12/14 Goal: Patient tolerating nututrition at goal Outcome: Completed/Met Date Met:  02/12/14

## 2014-02-12 NOTE — Progress Notes (Signed)
Thank you for consulting the Pall[facility] Medicine Team [facility] to meet your patient's and family's needs.   The reason that you asked us to see your patient is  For Clarification of GOC and options  We have scheduled your patient for a meeting: Sunday 02-14-14 @ 2pm with Dr Julaine FusiBeth Golding  The Surrogate decision make is: Husband/ Danne Baxternthony Mcelwee   # [phone]  Lorinda CreedMary Larach NP  Palliative Medicine Team Team Phone # [phone] Pager [phone]  Discussed with Derenda FennelBashira Nixon

## 2014-02-12 NOTE — Progress Notes (Signed)
   02/12/14 2217  Clinical Encounter Type  Visited With Patient;Health care provider  Visit Type Psychological support;Social support  Referral From Nurse  Consult/Referral To Chaplain  Recommendations Psych consult?  Stress Factors  Patient Stress Factors Family relationships;Health changes   Chaplain responded to page from nurse that pt "feels like she wants to die." Chaplain entered room while nurse was working with pt. Pt seemed reticent and wary of chaplain; Pt asked, "Why are you evil?" and "You don't look the same." Pt seemed confused and disoriented at times. When nurse left, pt continued to display disorientation, but told chaplain "I want to die," and said, "Why won't you let me die?" Chaplain tried to offer supportive presence, holding the patient's hands and offering empathy. Pt expressed loneliness. Pt asked, "Why me?" Chaplain said, "It doesn't seem fair, does it?" and pt became more emotional, with tears in her eyes. Pt became distressed when chaplain got ready to leave, repeatedly pulling on chaplain and saying "Don't leave". When asked what would happen if chaplain left, pt said, "I'll be lonely."  Chaplain asked how to make chaplain's leaving less distressing, but pt only said "Don't leave." Chaplain wonders if psych consult may be helpful. Chaplain will refer case to spiritual care team for further spiritual/emotional support.   Wille GlaserMcCray, Trystyn Sitts O, Chaplain 02/12/2014 11:06 PM

## 2014-02-12 NOTE — Progress Notes (Signed)
Clinical Social Worker spoke with pt's husband, Danne Baxternthony Cott and advised him to contact [place], NP/Palliative Care Team [phone]) for further information/ instructions. CSW explained to pt's husband the importance of contacting Palliative Care Team and pt's husband reported he would contact NP/ Palliative immediately.   CSW continues to follow patient and pt's family for support/disposition plan.  Derenda FennelBashira Floriene Jeschke, MSW, LCSWA [phone] 02/12/2014 2:06 PM

## 2014-02-12 NOTE — Plan of Care (Signed)
Problem: Phase II Progression Outcomes Goal: Date pt extubated/weaned off vent Outcome: Completed/Met Date Met:  02/12/14 Goal: Time pt extubated/weaned off vent Outcome: Completed/Met Date Met:  02/12/14

## 2014-02-12 NOTE — Plan of Care (Signed)
Problem: Phase I Progression Outcomes Goal: Optimized method of communication. Outcome: Completed/Met Date Met:  02/12/14

## 2014-02-12 NOTE — Progress Notes (Addendum)
[facility] for Infectious Disease  Date of Admission:  01/27/2014  Antibiotics: Acyclovir day 15/21 Etham/rifabutin/azithro day 8 Fluconazole day 11 Clinda/primaquine day 8   Subjective: Remains on vent, more weaning today  Objective: Temp:  [97.1 F (36.2 C)-98 F (36.7 C)] 97.4 F (36.3 C) (11/06 0825) Pulse Rate:  [74-117] 91 (11/06 0939) Resp:  [12-17] 15 (11/06 0939) BP: (120-193)/(67-112) 193/101 mmHg (11/06 0924) SpO2:  [97 %-100 %] 100 % (11/06 0939) FiO2 (%):  [30 %] 30 % (11/06 0900) Weight:  [125 lb 10.6 oz (57 kg)] 125 lb 10.6 oz (57 kg) (11/06 0500)  General: awake, responds to some commands Skin: no rashes Lungs: CTA B, anterior Cor: RRR Abdomen: soft, nt,nd Ext: no edema  Lab Results Lab Results  Component Value Date   WBC 8.0 02/12/2014   HGB 8.5* 02/12/2014   HCT 26.3* 02/12/2014   MCV 78.0 02/12/2014   PLT 323 02/12/2014    Lab Results  Component Value Date   CREATININE 3.20* 02/12/2014   BUN 99* 02/12/2014   NA 145 02/12/2014   K 5.3 02/12/2014   CL 103 02/12/2014   CO2 26 02/12/2014    Lab Results  Component Value Date   ALT 28 02/08/2014   AST 25 02/08/2014   ALKPHOS 82 02/08/2014   BILITOT <0.2* 02/08/2014      Microbiology: Recent Results (from the past 240 hour(s))  Pneumocystis smear by DFA     Status: None   Collection Time: 02/03/14  2:30 PM  Result Value Ref Range Status   Specimen Source-PJSRC SPUTUM  Final   Pneumocystis jiroveci Ag POSITIVE  Final    Comment: Performed at [facility] RN 02/04/14 1444 BY J.PEGRAM  Culture, respiratory (NON-Expectorated)     Status: None   Collection Time: 02/04/14  3:50 AM  Result Value Ref Range Status   Specimen Description TRACHEAL ASPIRATE  Final   Special Requests Immunocompromised  Final   Gram Stain   Final    FEW WBC PRESENT,BOTH PMN AND MONONUCLEAR RARE SQUAMOUS EPITHELIAL CELLS PRESENT NO ORGANISMS SEEN Performed at  [facility]   Culture   Final    NO GROWTH 2 DAYS Performed at [facility]   Report Status 02/06/2014 FINAL  Final  AFB culture with smear     Status: None (Preliminary result)   Collection Time: 02/04/14  3:50 AM  Result Value Ref Range Status   Specimen Description TRACHEAL ASPIRATE  Final   Special Requests NONE  Final   Acid Fast Smear   Final    1+ ACID FAST BACILLI SEEN CRITICAL RESULT CALLED TO, READ BACK BY AND VERIFIED WITH: CIARFI 14:55 110/30/15 FUENG CRITICAL RESULT CALLED TO, READ BACK BY AND VERIFIED WITH: LEE ANN STIMPSON H.D. 10:30 02/08/14 FUENG Performed at [facility]    Culture   Final    CULTURE WILL BE EXAMINED FOR 6 WEEKS BEFORE ISSUING A FINAL REPORT Performed at [facility]    Report Status PENDING  Incomplete  Fungus Culture with Smear     Status: None (Preliminary result)   Collection Time: 02/04/14  3:50 AM  Result Value Ref Range Status   Specimen Description TRACHEAL ASPIRATE  Final   Special Requests NONE  Final   Fungal Smear   Final    NO YEAST OR FUNGAL ELEMENTS SEEN Performed at [facility]    Culture   Final  CANDIDA ALBICANS Performed at [facility]    Report Status PENDING  Incomplete  Respiratory virus panel     Status: None   Collection Time: 02/04/14  3:50 AM  Result Value Ref Range Status   Source - RVPAN TRACHEAL ASPIRATE  Final   Respiratory Syncytial Virus A NOT DETECTED  Final   Respiratory Syncytial Virus B NOT DETECTED  Final   Influenza A NOT DETECTED  Final   Influenza B NOT DETECTED  Final   Parainfluenza 1 NOT DETECTED  Final   Parainfluenza 2 NOT DETECTED  Final   Parainfluenza 3 NOT DETECTED  Final   Metapneumovirus NOT DETECTED  Final   Rhinovirus NOT DETECTED  Final   Adenovirus NOT DETECTED  Final   Influenza A H1 NOT DETECTED  Final   Influenza A H3 NOT DETECTED  Final    Comment: (NOTE)       Normal Reference Range for each Analyte: NOT DETECTED Testing  performed using the Luminex xTAG Respiratory Viral Panel test kit. The analytical performance characteristics of this assay have been determined by [facility].  The modifications have not been cleared or approved by the FDA. This assay has been validated pursuant to the CLIA regulations and is used for clinical purposes. Performed at [facility], bal-quantitative     Status: None   Collection Time: 02/04/14 10:31 AM  Result Value Ref Range Status   Specimen Description BRONCHIAL ALVEOLAR LAVAGE  Final   Special Requests Immunocompromised  Final   Gram Stain   Final    FEW WBC PRESENT, PREDOMINANTLY MONONUCLEAR RARE SQUAMOUS EPITHELIAL CELLS PRESENT NO ORGANISMS SEEN Performed at [place] Performed at [facility]  Final   Culture   Final    NO GROWTH 2 DAYS Performed at [facility]   Report Status 02/06/2014 FINAL  Final  M. Tuberculosis complex by PCR     Status: None   Collection Time: 02/06/14  6:14 PM  Result Value Ref Range Status   M. tuberculosis, Direct Not detected Not detected Final    Comment: (NOTE) This test(s) was developed and its performance characteristics  have been determined by [facility],  [place], [place]. Performance characteristics refer to the analytical  performance of the test.    Source (MTBPCR) Sputum  Corrected    Comment: Performed at [facility] 11/06 AT 0442: PREVIOUSLY REPORTED AS SPUTUM   AFB culture with smear     Status: None (Preliminary result)   Collection Time: 02/06/14  6:14 PM  Result Value Ref Range Status   Specimen Description SPUTUM  Final   Special Requests NONE  Final   Acid Fast Smear   Final    1+ ACID FAST BACILLI SEEN REFER TO PREVIOUS CULTURE [id number] FOR CALLS Performed at [facility]    Culture   Final    CULTURE WILL BE EXAMINED FOR 6 WEEKS BEFORE ISSUING A FINAL  REPORT Performed at [facility]    Report Status PENDING  Incomplete  Culture, blood (routine x 2)     Status: None (Preliminary result)   Collection Time: 02/07/14  6:26 PM  Result Value Ref Range Status   Specimen Description BLOOD LEFT ARM  Final   Special Requests BOTTLES DRAWN AEROBIC AND ANAEROBIC 10CC  Final   Culture  Setup Time   Final    02/08/2014 02:25 Performed at  [facility]   Final           BLOOD CULTURE RECEIVED NO GROWTH TO DATE CULTURE WILL BE HELD FOR 5 DAYS BEFORE ISSUING A FINAL NEGATIVE REPORT Performed at [facility]    Report Status PENDING  Incomplete  Culture, blood (routine x 2)     Status: None (Preliminary result)   Collection Time: 02/07/14  6:32 PM  Result Value Ref Range Status   Specimen Description BLOOD BLOOD LEFT FOREARM  Final   Special Requests BOTTLES DRAWN AEROBIC ONLY 10CC  Final   Culture  Setup Time   Final    02/08/2014 02:25 Performed at [facility]    Culture   Final           BLOOD CULTURE RECEIVED NO GROWTH TO DATE CULTURE WILL BE HELD FOR 5 DAYS BEFORE ISSUING A FINAL NEGATIVE REPORT Performed at [facility]    Report Status PENDING  Incomplete    Studies/Results: Dg Chest Port 1 View  02/11/2014   CLINICAL DATA:  Ventilated patient; history of HIV knee, acute respiratory failure and aspiration pneumonia  EXAM: PORTABLE CHEST - 1 VIEW  COMPARISON:  Portable chest x-ray of February 10, 2014.  FINDINGS: The lungs are well-expanded and clear. The heart and mediastinal structures are normal. There is no pleural effusion or pneumothorax. The endotracheal tube tip lies 5.1 cm above the crotch of the carina. The left internal jugular venous catheter tip projects over the mid to distal SVC. The feeding tube tip projects below the inferior margin of the image. The bony thorax is unremarkable.  IMPRESSION: There is no evidence of pneumonia nor other acute cardiopulmonary abnormality.    Electronically Signed   By: David  Martin[place]   On: 02/11/2014 07:48    Assessment/Plan: 1)  PCP pneumonia - on appropriate therapy and will continue for 21 days including steroids.    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  Quantiferon indet.  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  Continue with MAC treatment.   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    4) HIV - started on ARVs.    With a negative Tb PCR and clear X ray, not c/w Tb so I will d/c airborne isolation.      Scharlene Gloss, [place] for Infectious Disease [place] [email or web address] [phone] pager   [phone] cell 02/12/2014, 10:05 AM

## 2014-02-12 NOTE — Plan of Care (Signed)
Problem: Phase I Progression Outcomes Goal: Patient tolerating weaning plan Outcome: Completed/Met Date Met:  02/12/14

## 2014-02-12 NOTE — Progress Notes (Signed)
PULMONARY / CRITICAL CARE MEDICINE   Name: Desiree Hale MRN: [id number] DOB: 04-25-1970    ADMISSION DATE:  01/27/2014 CONSULTATION DATE:  02/03/14  REFERRING MD :  [facility]   CHIEF COMPLAINT:  AMS, dyspnea  INITIAL PRESENTATION: 43 y/o female with hx HTN, admitted 10/21 with AMS/?seizures, diagnosed as HSV encephalitis as well as PNA (?aspiration) and new dx HIV.  Followed by neurology and ID and rx with acyclovir, PCP rx.  On 10/28 developed worsening fevers, tachypnea/resp failure and PCCM consulted for tx ICU.  Further w/u showed HIV pos with CD4 =10 (new diagnosis).    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  Otherwise neg acute.  10/22  EEG  >>> normal drowsy and asleep electroencephalogram. No epileptiform activity is noted.  10/22  ECHO >>> EF 55-60%, grade 1 diastolic dysfunction, no RWMA, small free-flowing pericardial effusion 10/28  Worsening tachypnea, AMS, tx ICU.  Required intubation  10/30  Renal US >> kidneys diffusely echogenic, concerning for medical renal disease 10/31  Remains on full support, fent gtt, received 1 unit PRBC's  11/01  More awake, anemia improved 11/02 CXR Mild infiltrate right lower lobe cannot be excluded  11/02 ID started ARV therapy . Renal - broad ddx for ATN and unstable for biopsy  SUBJECTIVE:    02/11/14: cognition and breathing status continue to improve   VITAL SIGNS: Temp:  [97.1 F (36.2 C)-98 F (36.7 C)] 97.4 F (36.3 C) (11/06 0825) Pulse Rate:  [74-117] 106 (11/06 0819) Resp:  [13-23] 17 (11/06 0819) BP: (120-191)/(67-112) 176/92 mmHg (11/06 0819) SpO2:  [97 %-100 %] 97 % (11/06 0700) FiO2 (%):  [30 %] 30 % (11/06 0819) Weight:  [125 lb 10.6 oz (57 kg)] 125 lb 10.6 oz (57 kg) (11/06 0500)  HEMODYNAMICS: CVP:  [4 mmHg] 4 mmHg  VENTILATOR SETTINGS: Vent Mode:  [-] PSV;CPAP FiO2 (%):  [30 %] 30 % Set Rate:  [14 bmp]  14 bmp Vt Set:  [500 mL] 500 mL PEEP:  [5 cmH20] 5 cmH20 Pressure Support:  [5 cmH20] 5 cmH20 Plateau Pressure:  [13 cmH20-26 cmH20] 13 cmH20  INTAKE / OUTPUT:  Intake/Output Summary (Last 24 hours) at 02/12/14 0838 Last data filed at 02/12/14 0600  Gross per 24 hour  Intake 2929.4 ml  Output   1595 ml  Net 1334.4 ml    PHYSICAL EXAMINATION: General:  Frail, cachectic, chronically ill appearing female Neuro:  rass -1,will move toes,  Opened eyes to voice HEENT: OETT, no jvd Cardiovascular:  s1s2 rrr,  Lungs: resp's even/non-labored, lungs coarse bilaterally  Abdomen:  Soft, +bs Musculoskeletal:  Warm and dry, no edema, foot drops boots in place  LABS:  PULMONARY  Recent Labs Lab 02/06/14 0350  PHART 7.313*  PCO2ART 29.6*  PO2ART 241.0*  HCO3 14.7*  TCO2 15.6  O2SAT 99.6    CBC  Recent Labs Lab 02/10/14 0433 02/11/14 0400 02/12/14 0410  HGB 8.8* 9.9* 8.5*  HCT 26.6* 30.1* 26.3*  WBC 9.6 10.8* 8.0  PLT 338 405* 323    COAGULATION No results for input(s): INR in the last 168 hours.  CARDIAC  No results for input(s): TROPONINI in the last 168 hours. No results for input(s): PROBNP in the last 168 hours.   CHEMISTRY  Recent Labs Lab 02/06/14 0500  02/08/14 1704 02/09/14 0400 02/10/14 0433 02/10/14 0700 02/11/14 0400 02/12/14 0500  NA 135*  < >  140 139 143  --  145 145  K 4.2  < > 5.2 5.1 5.6*  --  5.0 5.3  CL 104  < > 109 106 107  --  104 103  CO2 15*  < > 14* 17* 22  --  23 26  GLUCOSE 196*  < > 148* 221* 128*  --  156* 163*  BUN 49*  < > 85* 87* 91*  --  95* 99*  CREATININE 2.74*  < > 3.79* 3.78* 3.57*  --  3.31* 3.20*  CALCIUM 7.7*  < > 8.3* 8.3* 8.7  --  8.7 8.8  MG 2.3  --   --   --   --   --   --   --   PHOS 4.6  < > 6.9* 7.1*  --  7.9* 7.4* 7.7*  < > = values in this interval not displayed. Estimated Creatinine Clearance: 20.2 mL/min (by C-G formula based on Cr of 3.2).   LIVER  Recent Labs Lab 02/06/14 0500 02/08/14 0443  02/08/14 1704 02/09/14 0400 02/11/14 0400 02/12/14 0500  AST <5 25  --   --   --   --   ALT <5 28  --   --   --   --   ALKPHOS 77 82  --   --   --   --   BILITOT <0.2* <0.2*  --   --   --   --   PROT 5.6* 6.4  --   --   --   --   ALBUMIN 1.3* 1.7* 1.6* 1.6* 2.0* 1.9*     INFECTIOUS  Recent Labs Lab 02/07/14 0358  LATICACIDVEN 1.2     ENDOCRINE CBG (last 3)   Recent Labs  02/11/14 2044 02/12/14 0040 02/12/14 0422  GLUCAP 143* 162* 133*     IMAGING x48h Dg Chest Port 1 View  02/11/2014   CLINICAL DATA:  Ventilated patient; history of HIV knee, acute respiratory failure and aspiration pneumonia  EXAM: PORTABLE CHEST - 1 VIEW  COMPARISON:  Portable chest x-ray of February 10, 2014.  FINDINGS: The lungs are well-expanded and clear. The heart and mediastinal structures are normal. There is no pleural effusion or pneumothorax. The endotracheal tube tip lies 5.1 cm above the crotch of the carina. The left internal jugular venous catheter tip projects over the mid to distal SVC. The feeding tube tip projects below the inferior margin of the image. The bony thorax is unremarkable.  IMPRESSION: There is no evidence of pneumonia nor other acute cardiopulmonary abnormality.   Electronically Signed   By: David  [place]   On: 02/11/2014 07:48    ASSESSMENT / PLAN:  PULMONARY ETT 02/03/14 >> Acute Hypoxic Resp Failure  - Mild pulmonary infiltrates - PCP  - S/P Bronchoscopy - see ID  - breathing status continues to improve   P:   Wean PEEP/FiO2 for sats > 92% SBT / WUA daily  Steroids for PCP  NEUROLOGIC CMV >> 10 EBV >> less than 200  CSF JCV >> not detected  A: Acute encephalopathy - in setting of HSV encephalitis HSV encephalitis   P:   RASS goal: 0 Fentanyl drip, monitor for asynchrony  Neurology signed off  Cont keppra  Acyclovir as below  CSF studies as above    CARDIOVASCULAR HTN  Tachycardia - Resolved  SIRS  Elevated troponins - suspect demand  ischemia. Resolved   Diastolic dysfunction - EF 55-60% Small Pericardial Effusion - free flowing, noted  on ECHO 10/22  P:  Rx fever  PRN lopressor to keep HR <130 PRN hydralazine to keep SBP <170 ASA Hold labetalol SBP <130  RENAL ANA >>Neg  Anti-ds DNA>>neg  C3 >103 C4 >16 Lactic acid 1.1>1.2  AKI / ARF - bactrim stopped 10/30, renal US negative 10/30, UA with granular casts c/w medical renal dz.   Compensated AG Metabolic Acidosis - HIVAN vs HIVCK Hyperkalemia - resolved   P:   Trend BMP Monitor I/Os renal consult initiated Bicarb; may require HD at some point    GASTROINTESTINAL Protein calorie malnutrition  Diarrhea - Improving. CDiff neg, Giardia and cryptosporidium negative  P:  Nutrition following  -TFs PPI C. Diff 11/01>> Trend LFT's with acyclovir, WNL 11/02   HEMATOLOGIC DVT prevention Anemia - tx 10/31 x 1 P:  SQ heparin Trend CBC  FOB stool    INFECTIOUS BCx2 10/23>>>neg CSF culture 10/23>>> neg UC 10/21>>>neg  BAL 10/29>> neg Sputum 10/29 >> neg Sputum PCP DFA 10/28 >> PCP pos Sputum AFB cx w/ smear 10/29>>+1 AFB  BCx2 10/27>>>No Growth Sputum AFB 10/29 >> prelim neg >> Sputum Fungal 10/29 >> prelim neg >> BAL resp viral panel 10/29 >>none detected  Sputum AFB cx w/ smear 10/31>>+1 AFB  M. TB PCR 10/31 >> not detected  Quant gold 11/01>>Indeterm BCx2 11/01>>NGTD   A  HIV/AIDS - new dx HSV encephalitis  Persistent Fever - Resolved  PCP PNA TB vs MAC  Oral thrush  P:    ARV's per ID  Acyclovir 10/26>>> Zosyn 10/25>>>10/29 Bactrim 10/27>>>10/30 Azithro (weekly, MAC prophylaxis) 10/27>>> Diflucan 10/27>>> Clinda 10/30 >>> Ethambutol 10/30 >>> Rifabutin 10/30 >>>  Antimicrobial therapies per ID Steroids IV for PCP  Repeat sputum for AFB & TB PCR 10/31  ENDOCRINE No active issue  P:   High risk hypoglycemia  D5NS at 4650ml/hr  FAMILY:   Updates: No family at bedside 02/11/14 INterdisciplinary family meet :  16 days  LOS. Palliative care organizing family meeting.     Today's Summary:  43 y/o F with new diagnosed HIV, PCP PNA, TB positive sputum (work up for TB vs MAC in progress), course complicated by poor weaning efforts, anemia and renal failure.  Continue current care for now.     Myra RudeJeremy E Schmitz, MD PGY-2, [facility]

## 2014-02-13 DIAGNOSIS — A310 Pulmonary mycobacterial infection: Secondary | ICD-10-CM

## 2014-02-13 LAB — RENAL FUNCTION PANEL
Albumin: 1.7 g/dL — ABNORMAL LOW (ref 3.5–5.2)
Anion gap: 14 (ref 5–15)
BUN: 105 mg/dL — AB (ref 6–23)
CALCIUM: 8.5 mg/dL (ref 8.4–10.5)
CO2: 25 meq/L (ref 19–32)
Chloride: 99 mEq/L (ref 96–112)
Creatinine, Ser: 2.94 mg/dL — ABNORMAL HIGH (ref 0.50–1.10)
GFR calc non Af Amer: 18 mL/min — ABNORMAL LOW (ref >90)
GFR, EST AFRICAN AMERICAN: 21 mL/min — AB (ref >90)
GLUCOSE: 107 mg/dL — AB (ref 70–99)
PHOSPHORUS: 6.6 mg/dL — AB (ref 2.3–4.6)
Potassium: 4.5 mEq/L (ref 3.7–5.3)
Sodium: 138 mEq/L (ref 137–147)

## 2014-02-13 LAB — GLUCOSE, CAPILLARY
GLUCOSE-CAPILLARY: 89 mg/dL (ref 70–99)
Glucose-Capillary: 103 mg/dL — ABNORMAL HIGH (ref 70–99)
Glucose-Capillary: 109 mg/dL — ABNORMAL HIGH (ref 70–99)
Glucose-Capillary: 143 mg/dL — ABNORMAL HIGH (ref 70–99)
Glucose-Capillary: 86 mg/dL (ref 70–99)
Glucose-Capillary: 95 mg/dL (ref 70–99)
Glucose-Capillary: 99 mg/dL (ref 70–99)

## 2014-02-13 LAB — CBC
HEMATOCRIT: 27 % — AB (ref 36.0–46.0)
Hemoglobin: 9 g/dL — ABNORMAL LOW (ref 12.0–15.0)
MCH: 25.6 pg — AB (ref 26.0–34.0)
MCHC: 33.3 g/dL (ref 30.0–36.0)
MCV: 76.9 fL — ABNORMAL LOW (ref 78.0–100.0)
Platelets: 306 10*3/uL (ref 150–400)
RBC: 3.51 MIL/uL — ABNORMAL LOW (ref 3.87–5.11)
RDW: 17.4 % — ABNORMAL HIGH (ref 11.5–15.5)
WBC: 6.3 10*3/uL (ref 4.0–10.5)

## 2014-02-13 LAB — MAGNESIUM: MAGNESIUM: 2.1 mg/dL (ref 1.5–2.5)

## 2014-02-13 MED ORDER — AZITHROMYCIN 500 MG PO TABS
500.0000 mg | ORAL_TABLET | Freq: Every day | ORAL | Status: DC
  Administered 2014-02-13 – 2014-02-15 (×3): 500 mg via ORAL
  Filled 2014-02-13 (×3): qty 1

## 2014-02-13 MED ORDER — FLUCONAZOLE 40 MG/ML PO SUSR
100.0000 mg | ORAL | Status: DC
  Administered 2014-02-20: 100 mg
  Filled 2014-02-13: qty 2.5

## 2014-02-13 MED ORDER — PANTOPRAZOLE SODIUM 40 MG PO TBEC
40.0000 mg | DELAYED_RELEASE_TABLET | Freq: Every day | ORAL | Status: DC
  Administered 2014-02-14 – 2014-02-15 (×2): 40 mg via ORAL
  Filled 2014-02-13 (×2): qty 1

## 2014-02-13 NOTE — Plan of Care (Signed)
Problem: Phase II Progression Outcomes Goal: O2 sats > equal to 90% on RA or at baseline Outcome: Completed/Met Date Met:  02/13/14 Goal: Pain controlled on oral analgesia Outcome: Completed/Met Date Met:  02/13/14 Goal: Dyspnea controlled w/progressive activity Outcome: Completed/Met Date Met:  02/13/14

## 2014-02-13 NOTE — Progress Notes (Signed)
Patient noted to have stage 2 on sacrum. Desiree RearLonnie Asuncion Shibata, MSN, RN, [employer]RN

## 2014-02-13 NOTE — Progress Notes (Signed)
Pt has been tugging and pulling at lines, taking off monitor, and BP cuff this shift. Re-oriented many times of the unsafe manner of all this. Pt has now pulled out Panda tube, CCM made aware. Pt also has been calling out multiple times tonight, saying she is lonely and asking for various random items and tasks to be done. Husband and family also aware of pt's behavior, husband said, "She's just trippin'." Will continue to monitor.

## 2014-02-13 NOTE — Progress Notes (Signed)
Patient ID: Desiree BeamsKristy Hale, female    DOB: [birth year], 43 y.o.   MRN: [id number] Nephrology Progress Note  S: Issues with pt pulling at lines/leads/etc Wants me to get the line out of her neck, give her some breakfast. Tried to talk with her about her kidney issues but she couldn't focus on that - conversation ended with "please help me"  O:BP 164/100 mmHg  Pulse 68  Temp(Src) 98.9 F (37.2 C) (Oral)  Resp 22  Ht 5\' 7"  (1.702 m)  Wt 124 lb 12.5 oz (56.6 kg)  BMI 19.54 kg/m2  SpO2 100%  LMP   Intake/Output Summary (Last 24 hours) at 02/13/14 0701 Last data filed at 02/13/14 0700  Gross per 24 hour  Intake 2947.73 ml  Output   1750 ml  Net 1197.73 ml   Intake/Output: I/O last 3 completed shifts: In: 4517.7 [I.V.:1963.3; NG/GT:1895; IV Piggyback:659.4] Out: 2520 [Urine:2020; Stool:500]  Intake/Output this shift:    Weight change: -14.1 oz (-0.4 kg)  Gen: Laying in bed, has been extubated, looks comfortable but in mittens and wants me to "take the thing out of her neck" CVP 3 last evening HEENT: Left IJ in place. No JVD CVS: Regular rate and rhythm Resp: Clear on anterior auscultation Abd: Soft, not focally tender Ext: Pitting of posterior thighs and arms   Recent Labs Lab 02/08/14 0443 02/08/14 1704 02/09/14 0400 02/10/14 0433 02/10/14 0700 02/11/14 0400 02/12/14 0500 02/13/14 0412  NA 139 140 139 143  --  145 145 138  K 5.5* 5.2 5.1 5.6*  --  5.0 5.3 4.5  CL 107 109 106 107  --  104 103 99  CO2 13* 14* 17* 22  --  23 26 25   GLUCOSE 151* 148* 221* 128*  --  156* 163* 107*  BUN 76* 85* 87* 91*  --  95* 99* 105*  CREATININE 3.51* 3.79* 3.78* 3.57*  --  3.31* 3.20* 2.94*  ALBUMIN 1.7* 1.6* 1.6*  --   --  2.0* 1.9* 1.7*  CALCIUM 8.5 8.3* 8.3* 8.7  --  8.7 8.8 8.5  PHOS 5.5* 6.9* 7.1*  --  7.9* 7.4* 7.7* 6.6*  AST 25  --   --   --   --   --   --   --   ALT 28  --   --   --   --   --   --   --    Liver Function Tests:  Recent Labs Lab 02/08/14 0443   02/11/14 0400 02/12/14 0500 02/13/14 0412  AST 25  --   --   --   --   ALT 28  --   --   --   --   ALKPHOS 82  --   --   --   --   BILITOT <0.2*  --   --   --   --   PROT 6.4  --   --   --   --   ALBUMIN 1.7*  < > 2.0* 1.9* 1.7*  < > = values in this interval not displayed. No results for input(s): LIPASE, AMYLASE in the last 168 hours. No results for input(s): AMMONIA in the last 168 hours. CBC:  Recent Labs Lab 02/07/14 0400  02/09/14 0400 02/10/14 0433 02/11/14 0400 02/12/14 0410 02/13/14 0415  WBC 11.5*  < > 8.3 9.6 10.8* 8.0 6.3  NEUTROABS 10.9*  --   --   --   --   --   --  HGB 8.3*  < > 8.0* 8.8* 9.9* 8.5* 9.0*  HCT 25.0*  < > 23.6* 26.6* 30.1* 26.3* 27.0*  MCV 76.7*  < > 76.6* 76.4* 78.8 78.0 76.9*  PLT 289  < > 290 338 405* 323 306  < > = values in this interval not displayed.  CBG:  Recent Labs Lab 02/12/14 0824 02/12/14 1157 02/12/14 1607 02/12/14 1949 02/12/14 2329  GLUCAP 174* 133* 146* 141* 143*   Medications: . abacavir  600 mg Per NG tube Daily  . acyclovir  10 mg/kg Intravenous Q24H  . antiseptic oral rinse  7 mL Mouth Rinse q12n4p  . aspirin  81 mg Oral Daily  . azithromycin  600 mg Per Tube Daily  . chlorhexidine  15 mL Mouth Rinse BID  . clindamycin (CLEOCIN) IV  600 mg Intravenous 3 times per day  . dolutegravir  50 mg Oral BID  . ethambutol  800 mg Per Tube Q24H  . feeding supplement (PRO-STAT SUGAR FREE 64)  30 mL Per Tube Daily  . fluconazole  100 mg Per Tube Daily  . heparin subcutaneous  5,000 Units Subcutaneous 3 times per day  . labetalol  200 mg Oral BID  . lamiVUDine  100 mg Oral Daily  . levETIRAcetam  500 mg Intravenous Q12H  . methylPREDNISolone (SOLU-MEDROL) injection  40 mg Intravenous Q12H  . pantoprazole sodium  40 mg Per Tube Daily  . primaquine  30 mg Per Tube Q24H  . rifabutin  150 mg Oral Daily  Infusions . dextrose 5 % and 0.45% NaCl 50 mL/hr at 02/13/14 0547  . feeding supplement (JEVITY 1.2 CAL) Stopped  (02/13/14 0130)   Results for Desiree Hale, Desiree Hale (MRN [id number]) as of 02/09/2014 12:21  Ref. 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 She was admitted with an initial creatinine of 3.07 with a transient improvement to 1.45, then progressive worsening,  peaked at 3.78 on 11/3 with slow imp since. Exposed to many antiinfectives (some that are nephrotoxic agents) in addition to having newly diagnosed HIV/AIDs that is most likely a contributing factor. Weight on admission of 88lbs improved to 125lbs. Patient encephalopathy appears to be improving along with her kidney function.  Assessment/Plan: 1. AKI with metabolic acidosis: Appears to be multifactorial as patient arrived with renal insufficiency  but has also received many nephrotoxic agents . Concern patient may have HIVAN vs HIVICK as a background cause for CKD with possible superimposedAIN vs ATN. Relative hypotension (big swings in systolic BP) could also have played  a part in patient's worsening kidney function (renal ischemia). Patient has proteinuria with granular casts on urinalysis. UPC 550 mg (non-nephrotic). Renal ultrasound shows echodense kidneys. Urine output adequate (1.25L/24hr). ANA and anti-DNA ab wnl. C3 and C4 wnl.  Unstable for renal biopsy, which would be needed to confirm pathology in the future unless full return to normal renal function. Creatinine continues to improve slowly. CO2 is stable. Continuing to trend renal function. Continue D51/2NS at 68ml/hr. Continue to follow labs. 2. Hyperphosphatemia: has been consistently elevated but has decreased to 6.6 today (11/7) from 7.7 yesterday. Will not give phosphate binders since patient is intubated and corrected calcium is  borderline high (10.5) as she would need calcium based binder.Should continue to fall as renal function improves  3. Hypertension: patient currently on labetalol. 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 Continues to improve 5. HIV/AIDs: recently diagnosed. Unsure if this is a known diagnosis by patient. HIV therapy started 11/2. Will need to be cautious as to not cause further kidney injury. CMV negative. M. tuberculosis negative. 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 6. PCP pneumonia: management per primary and infectious disease 7. Diabetes mellitus: listed in history, although no documented medications. Per primary  Jacquelin Hawkingalph Nettey, MD PGY-2, [facility] 02/13/2014, 7:01 AM   I have seen and examined this patient and agree with plan ad assessment in the above note with highlighted modifications. Her acute kidney injury is slowly improving. Electrolytes look OK now.  Unclear what degree of recovery she will have.  She may require a renal biopsy at some point in the future for renal diagnosis, depending on where renal function settles out.  We are adding little to her management at this point.  I have talked to Dr. Marchelle Gearingamaswamy - I will sign off at this juncture but if renal followup is needed at whatever point she is discharged (assuming no further acute renal issues this hospitalization) from the hospital I am happy to see her in the office for this. Pennelope Basque B,MD 02/13/2014 9:17 AM

## 2014-02-13 NOTE — Progress Notes (Signed)
Pt pulled heart monitor leads off again, CVP bag found on floor, picking at central line with dressing completely off even with safety mittens on. Re-oriented and educated patient again but pt with poor safety awareness. Dr. Craige CottaSood made aware and restraints started. Will continue to monitor pt.

## 2014-02-13 NOTE — Progress Notes (Signed)
Reported call to VermontLonnie, Charity fundraiserN.  Pt transferred to 5W30 via bed with personal belongings.  Pt's husband, Mr. Brooke DareKing, notified of room change.  Sadiq Mccauley, [facility] Hope

## 2014-02-13 NOTE — Progress Notes (Signed)
PULMONARY / CRITICAL CARE MEDICINE   Name: Desiree BeamsKristy Zayas MRN: [id number] DOB: 20-Sep-1970    ADMISSION DATE:  01/27/2014 CONSULTATION DATE:  02/03/14  REFERRING MD :  [facility]   CHIEF COMPLAINT:  AMS, dyspnea  INITIAL PRESENTATION: 43 y/o female with hx HTN, admitted 10/21 with AMS/?seizures, diagnosed as HSV encephalitis as well as PNA (?aspiration) and new dx HIV.  Followed by neurology and ID and rx with acyclovir, PCP rx.  On 10/28 developed worsening fevers, tachypnea/resp failure and PCCM consulted for tx ICU.  Further w/u showed HIV pos with CD4 =10 (new diagnosis).    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  Otherwise neg acute.  10/22  EEG  >>> normal drowsy and asleep electroencephalogram. No epileptiform activity is noted.  10/22  ECHO >>> EF 55-60%, grade 1 diastolic dysfunction, no RWMA, small free-flowing pericardial effusion 10/28  Worsening tachypnea, AMS, tx ICU.  Required intubation  10/30  Renal US >> kidneys diffusely echogenic, concerning for medical renal disease 10/31  Remains on full support, fent gtt, received 1 unit PRBC's  11/01  More awake, anemia improved 11/02 CXR Mild infiltrate right lower lobe cannot be excluded  11/02 ID started ARV therapy . Renal - broad ddx for ATN and unstable for biopsy 02/11/14: cognition and breathing status continue to improve . Extubated    SUBJECTIVE/OVERNIGHT/INTERVAL HX 02/13/14: remains extubated. Finally some family showed up; palliative meeting set up for tomorrow per RN. She moans and keeps pulling at things. Needs restraint v sitter. ID dc'ed airborne isolation  VITAL SIGNS: Temp:  [97.3 F (36.3 C)-98.9 F (37.2 C)] 98.3 F (36.8 C) (11/07 0755) Pulse Rate:  [68-92] 68 (11/07 0400) Resp:  [13-30] 19 (11/07 1100) BP: (129-182)/(64-104) 133/79 mmHg (11/07 1100) SpO2:  [95 %-100 %] 99 % (11/07 0800) FiO2 (%):   [30 %] 30 % (11/06 1200) Weight:  [56.6 kg (124 lb 12.5 oz)] 56.6 kg (124 lb 12.5 oz) (11/07 0500)  HEMODYNAMICS: CVP:  [3 mmHg-4 mmHg] 3 mmHg  VENTILATOR SETTINGS: Vent Mode:  [-]  FiO2 (%):  [30 %] 30 %  INTAKE / OUTPUT:  Intake/Output Summary (Last 24 hours) at 02/13/14 1155 Last data filed at 02/13/14 1000  Gross per 24 hour  Intake 2308.33 ml  Output   1595 ml  Net 713.33 ml    PHYSICAL EXAMINATION: General:  Frail, cachectic, chronically ill appearing female Neuro:  rass +1 to +2. Alert. Perioidc moans. Follws commands. Needs constant reassurance not to pull at things HEENT: Supple neck Cardiovascular:  s1s2 rrr,  Lungs: resp's even/non-labored, lungs coarse bilaterally  Abdomen:  Soft, +bs Musculoskeletal:  Warm and dry, no edema, foot drops boots in place  LABS:  PULMONARY No results for input(s): PHART, PCO2ART, PO2ART, HCO3, TCO2, O2SAT in the last 168 hours.  Invalid input(s): PCO2, PO2  CBC  Recent Labs Lab 02/11/14 0400 02/12/14 0410 02/13/14 0415  HGB 9.9* 8.5* 9.0*  HCT 30.1* 26.3* 27.0*  WBC 10.8* 8.0 6.3  PLT 405* 323 306    COAGULATION No results for input(s): INR in the last 168 hours.  CARDIAC  No results for input(s): TROPONINI in the last 168 hours. No results for input(s): PROBNP in the last 168 hours.   CHEMISTRY  Recent Labs Lab 02/09/14 0400 02/10/14 0433 02/10/14 0700 02/11/14 0400 02/12/14 0500 02/13/14 0412 02/13/14 0415  NA 139 143  --  145 145 138  --   K 5.1 5.6*  --  5.0 5.3 4.5  --   CL 106 107  --  104 103 99  --   CO2 17* 22  --  23 26 25   --   GLUCOSE 221* 128*  --  156* 163* 107*  --   BUN 87* 91*  --  95* 99* 105*  --   CREATININE 3.78* 3.57*  --  3.31* 3.20* 2.94*  --   CALCIUM 8.3* 8.7  --  8.7 8.8 8.5  --   MG  --   --   --   --   --   --  2.1  PHOS 7.1*  --  7.9* 7.4* 7.7* 6.6*  --    Estimated Creatinine Clearance: 21.8 mL/min (by C-G formula based on Cr of 2.94).   LIVER  Recent Labs Lab  02/08/14 0443 02/08/14 1704 02/09/14 0400 02/11/14 0400 02/12/14 0500 02/13/14 0412  AST 25  --   --   --   --   --   ALT 28  --   --   --   --   --   ALKPHOS 82  --   --   --   --   --   BILITOT <0.2*  --   --   --   --   --   PROT 6.4  --   --   --   --   --   ALBUMIN 1.7* 1.6* 1.6* 2.0* 1.9* 1.7*     INFECTIOUS  Recent Labs Lab 02/07/14 0358  LATICACIDVEN 1.2     ENDOCRINE CBG (last 3)   Recent Labs  02/12/14 1949 02/12/14 2329 02/13/14 0733  GLUCAP 141* 143* 109*     IMAGING x48h No results found.  ASSESSMENT / PLAN:  PULMONARY ETT 02/03/14 >>02/12/14 Acute Hypoxic Resp Failure  - Mild pulmonary infiltrates - PCP  - S/P Bronchoscopy - see ID  - s/p extubation 02/12/14  - breathing status normal 02/13/14   P:   Steroids for PCP O2 for pulse ox > 88%   NEUROLOGIC CMV >> 10 EBV >> less than 200  CSF JCV >> not detected  A: Acute encephalopathy - in setting of HSV encephalitis HSV encephalitis   -neuro signed off   - improving mental status. Currently slow with restlessness but followign commands  P:   RASS goal: 0 Dc Fentanyl drip, Cont keppra  Acyclovir as below  CSF studies as above  Needs sitter     CARDIOVASCULAR HTN  SIRS  Elevated troponins - suspect demand ischemia. Resolved   Diastolic dysfunction - EF 55-60% Small Pericardial Effusion - free flowing, noted on ECHO 10/22   - stable status 02/13/14. Not needing anti hypertensives other than scheduled labetalols  P:  Dc PRN lopressor to keep HR <130 Dc PRN hydralazine to keep SBP <170 Daily ASA to continue Contnue but Hold labetalol SBP for  <130  RENAL ANA >>Neg  Anti-ds DNA>>neg  C3 >103 C4 >16 Lactic acid 1.1>1.2  AKI / ARF - bactrim stopped 10/30, renal US negative 10/30, UA with granular casts c/w medical renal dz.   Compensated AG Metabolic Acidosis - HIVAN vs HIVCK    - improving renal function. Renal suspects might need renal bx as opd and signed off  02/13/14  P:   Monitor creat Might need renal opd folowup  GASTROINTESTINAL Protein calorie malnutrition  Diarrhea - CDiff neg, Giardia and cryptosporidium negative   -  still with diarrhea and rectal pouch 02/13/14; unchanged  P:  PO diet PPI Trend LFT's with acyclovir,    HEMATOLOGIC DVT prevention Anemia - tx 10/31 x 1 P:  SQ heparin Trend CBC ; PRBC for hgb < 7gm%    INFECTIOUS BCx2 10/23>>>neg CSF culture 10/23>>> neg UC 10/21>>>neg  BAL 10/29>> neg Sputum 10/29 >> neg Sputum PCP DFA 10/28 >> PCP pos Sputum AFB cx w/ smear 10/29>>+1 AFB  BCx2 10/27>>>No Growth Sputum AFB 10/29 >> prelim neg >> Sputum Fungal 10/29 >> prelim neg >> BAL resp viral panel 10/29 >>none detected  Sputum AFB cx w/ smear 10/31>>+1 AFB  M. TB PCR 10/31 >> not detected  Quant gold 11/01>>Indeterm BCx2 11/01>>NGTD   A  HIV/AIDS - new dx HSV encephalitis  Persistent Fever - Resolved  PCP PNA TB vs MAC  Oral thrush  P:   ALL PER ID - Check their note for accuracey of meds Steroids per ID for PCP  ENDOCRINE No active issue . Eating ok P:   Dc D5NS    FAMILY:   Updates: No family at bedside 02/11/14 INterdisciplinary family meet :  17 days LOS. Palliative care organizing family meeting for 02/14/14 2pm (much difficulty getting hold of them)     Today's Summary:  43 y/o F with new diagnosed HIV, PCP PNA, TB positive sputum (work up for TB vs MAC in progress), course complicated by poor weaning efforts, anemia and renal failure.extubated 02/12/14. Now weak an ddeconditioned and with resolving encphalopathy. Move to floor with sitter under triad head. PCCM will sign off    Dr. Kalman Shan, M.D., [facility].C.P Pulmonary and Critical Care Medicine Staff Physician [facility] Pager: [phone], If no answer or between  15:00h - 7:00h: call [phone]  02/13/2014 12:12 PM

## 2014-02-13 NOTE — Progress Notes (Signed)
Patient ID: Desiree Hale, female   DOB: 03/13/1971, 43 y.o.   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 abacavir  600 mg Per NG tube Daily  . acyclovir  10 mg/kg Intravenous Q24H  . aspirin  81 mg Oral Daily  . azithromycin  500 mg Oral Daily  . clindamycin (CLEOCIN) IV  600 mg Intravenous 3 times per day  . dolutegravir  50 mg Oral BID  . ethambutol  800 mg Per Tube Q24H  . fluconazole  100 mg Per Tube Daily  . heparin subcutaneous  5,000 Units Subcutaneous 3 times per day  . labetalol  200 mg Oral BID  . lamiVUDine  100 mg Oral Daily  . levETIRAcetam  500 mg Intravenous Q12H  . methylPREDNISolone (SOLU-MEDROL) injection  40 mg Intravenous Q12H  . pantoprazole sodium  40 mg Per Tube Daily  . primaquine  30 mg Per Tube Q24H  . rifabutin  150 mg Oral Daily    OBJECTIVE: Blood pressure 133/79, pulse 68, temperature 98.3 F (36.8 C), temperature source Oral, resp. rate 19, height _0  (1.702 m), weight 124 lb 12.5 oz (56.6 kg), SpO2 99 %. General: she is now extubated, alert but confused  Lab Results Lab Results  Component Value Date   WBC 6.3 02/13/2014   HGB 9.0* 02/13/2014   HCT 27.0* 02/13/2014   MCV 76.9* 02/13/2014   PLT 306 02/13/2014    Lab Results  Component Value Date   CREATININE 2.94* 02/13/2014   BUN 105* 02/13/2014   NA 138 02/13/2014   K 4.5 02/13/2014   CL 99 02/13/2014   CO2 25 02/13/2014    Lab Results  Component Value Date   ALT 28 02/08/2014   AST 25 02/08/2014   ALKPHOS 82 02/08/2014   BILITOT <0.2* 02/08/2014     Microbiology: Recent Results (from the past 240 hour(s))  Pneumocystis smear by DFA     Status: None   Collection Time: 02/03/14  2:30 PM  Result Value Ref Range Status   Specimen Source-PJSRC SPUTUM  Final   Pneumocystis jiroveci Ag POSITIVE  Final    Comment: Performed at [facility] RN 02/04/14 1444 BY J.PEGRAM  Culture, respiratory (NON-Expectorated)     Status: None   Collection Time: 02/04/14  3:50 AM  Result Value Ref Range Status   Specimen Description TRACHEAL ASPIRATE  Final   Special Requests Immunocompromised  Final  Gram Stain   Final    FEW WBC PRESENT,BOTH PMN AND MONONUCLEAR RARE SQUAMOUS EPITHELIAL CELLS PRESENT NO ORGANISMS SEEN Performed at [facility]   Culture   Final    NO GROWTH 2 DAYS Performed at [facility]   Report Status 02/06/2014 FINAL  Final  AFB culture with smear     Status: None (Preliminary result)   Collection Time: 02/04/14  3:50 AM  Result Value Ref Range Status   Specimen Description TRACHEAL ASPIRATE  Final   Special Requests NONE  Final   Acid Fast Smear   Final    1+ ACID FAST BACILLI SEEN CRITICAL RESULT CALLED TO, READ BACK BY AND VERIFIED WITH: CIARFI 14:55 110/30/15 FUENG CRITICAL RESULT CALLED TO, READ BACK BY AND VERIFIED WITH: LEE ANN STIMPSON H.D. 10:30 02/08/14 FUENG Performed at [facility]    Culture   Final    CULTURE WILL BE EXAMINED FOR 6 WEEKS BEFORE ISSUING A FINAL REPORT Performed at [facility]    Report Status PENDING  Incomplete  Fungus  Culture with Smear     Status: None (Preliminary result)   Collection Time: 02/04/14  3:50 AM  Result Value Ref Range Status   Specimen Description TRACHEAL ASPIRATE  Final   Special Requests NONE  Final   Fungal Smear   Final    NO YEAST OR FUNGAL ELEMENTS SEEN Performed at [facility]    Culture   Final    CANDIDA ALBICANS Performed at [facility]    Report Status PENDING  Incomplete  Respiratory virus panel     Status: None   Collection Time: 02/04/14  3:50 AM  Result Value Ref Range Status   Source - RVPAN TRACHEAL ASPIRATE  Final   Respiratory Syncytial Virus A NOT DETECTED  Final   Respiratory Syncytial Virus B NOT DETECTED  Final   Influenza A NOT DETECTED  Final   Influenza B NOT DETECTED  Final   Parainfluenza 1 NOT DETECTED  Final   Parainfluenza 2 NOT DETECTED  Final   Parainfluenza 3 NOT DETECTED  Final   Metapneumovirus NOT DETECTED  Final   Rhinovirus NOT DETECTED  Final   Adenovirus NOT DETECTED  Final   Influenza A H1 NOT DETECTED  Final   Influenza A H3 NOT DETECTED  Final    Comment: (NOTE)       Normal Reference Range for each Analyte: NOT DETECTED Testing performed using the Luminex xTAG Respiratory Viral Panel test kit. The analytical performance characteristics of this assay have been determined by [facility].  The modifications have not been cleared or approved by the FDA. This assay has been validated pursuant to the CLIA regulations and is used for clinical purposes. Performed at [facility], bal-quantitative     Status: None   Collection Time: 02/04/14 10:31 AM  Result Value Ref Range Status   Specimen Description BRONCHIAL ALVEOLAR LAVAGE  Final   Special Requests Immunocompromised  Final   Gram Stain   Final    FEW WBC PRESENT, PREDOMINANTLY MONONUCLEAR RARE SQUAMOUS EPITHELIAL CELLS PRESENT NO ORGANISMS SEEN Performed at [place] Performed at [facility]  Final   Culture   Final    NO GROWTH 2 DAYS Performed at [facility]   Report Status 02/06/2014 FINAL  Final  M. Tuberculosis complex by PCR     Status: None  Collection Time: 02/06/14  6:14 PM  Result Value Ref Range Status   M. tuberculosis, Direct Not detected Not detected Final    Comment: (NOTE) This test(s) was developed and its performance characteristics  have been determined by [employer] [facility],  [place], [place]. Performance characteristics refer to the analytical  performance of the test.    Source (MTBPCR) Sputum  Corrected    Comment: Performed at [facility] [facility] 11/06 AT 0442: PREVIOUSLY REPORTED AS SPUTUM   AFB culture with smear     Status: None (Preliminary result)   Collection Time: 02/06/14  6:14 PM  Result Value Ref Range Status   Specimen Description SPUTUM  Final   Special Requests NONE  Final   Acid Fast Smear   Final    1+ ACID FAST BACILLI SEEN REFER TO PREVIOUS CULTURE [id number] FOR CALLS Performed at [facility]    Culture   Final    CULTURE WILL BE EXAMINED FOR 6 WEEKS BEFORE ISSUING A FINAL REPORT Performed at [facility]    Report Status PENDING  Incomplete  Culture, blood (routine x 2)     Status: None (Preliminary result)   Collection Time: 02/07/14  6:26 PM  Result Value Ref Range Status   Specimen Description BLOOD LEFT ARM  Final   Special Requests BOTTLES DRAWN AEROBIC AND ANAEROBIC 10CC  Final   Culture  Setup Time   Final    02/08/2014 02:25 Performed at [facility]    Culture   Final           BLOOD CULTURE RECEIVED NO GROWTH TO DATE CULTURE WILL BE HELD FOR 5 DAYS BEFORE ISSUING A FINAL NEGATIVE REPORT Performed at [facility]    Report Status PENDING  Incomplete  Culture, blood (routine x 2)     Status: None (Preliminary result)   Collection Time: 02/07/14  6:32 PM  Result Value Ref Range Status   Specimen Description BLOOD BLOOD LEFT  FOREARM  Final   Special Requests BOTTLES DRAWN AEROBIC ONLY 10CC  Final   Culture  Setup Time   Final    02/08/2014 02:25 Performed at [facility]    Culture   Final           BLOOD CULTURE RECEIVED NO GROWTH TO DATE CULTURE WILL BE HELD FOR 5 DAYS BEFORE ISSUING A FINAL NEGATIVE REPORT Performed at [facility]    Report Status PENDING  Incomplete    Assessment: Her nurse tells me that she has known about her HIV infection for many years but was not taking medication prior to admission. 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  Plan: 1. Continue current, complex antimicrobial therapy (will change azithromycin to tablet form) 2. Change fluconazole to once weekly prophylactic dosing 3. Await Mycobacterium avium PCR and AFB culture results 4. We will follow-up on Monday, 02/15/2014  Michel Bickers, MD [facility] for [facility] [phone] pager   [phone] cell 02/13/2014, 12:07 PM

## 2014-02-14 LAB — CBC
HEMATOCRIT: 27.1 % — AB (ref 36.0–46.0)
Hemoglobin: 8.9 g/dL — ABNORMAL LOW (ref 12.0–15.0)
MCH: 25.9 pg — AB (ref 26.0–34.0)
MCHC: 32.8 g/dL (ref 30.0–36.0)
MCV: 78.8 fL (ref 78.0–100.0)
PLATELETS: 294 10*3/uL (ref 150–400)
RBC: 3.44 MIL/uL — ABNORMAL LOW (ref 3.87–5.11)
RDW: 17.5 % — AB (ref 11.5–15.5)
WBC: 5.4 10*3/uL (ref 4.0–10.5)

## 2014-02-14 LAB — RENAL FUNCTION PANEL
Albumin: 1.7 g/dL — ABNORMAL LOW (ref 3.5–5.2)
Anion gap: 16 — ABNORMAL HIGH (ref 5–15)
BUN: 113 mg/dL — ABNORMAL HIGH (ref 6–23)
CHLORIDE: 103 meq/L (ref 96–112)
CO2: 23 mEq/L (ref 19–32)
Calcium: 8.8 mg/dL (ref 8.4–10.5)
Creatinine, Ser: 3.01 mg/dL — ABNORMAL HIGH (ref 0.50–1.10)
GFR calc Af Amer: 21 mL/min — ABNORMAL LOW (ref >90)
GFR, EST NON AFRICAN AMERICAN: 18 mL/min — AB (ref >90)
Glucose, Bld: 90 mg/dL (ref 70–99)
Phosphorus: 7.8 mg/dL — ABNORMAL HIGH (ref 2.3–4.6)
Potassium: 4.3 mEq/L (ref 3.7–5.3)
SODIUM: 142 meq/L (ref 137–147)

## 2014-02-14 LAB — GLUCOSE, CAPILLARY
GLUCOSE-CAPILLARY: 121 mg/dL — AB (ref 70–99)
Glucose-Capillary: 71 mg/dL (ref 70–99)
Glucose-Capillary: 80 mg/dL (ref 70–99)

## 2014-02-14 LAB — MAGNESIUM: Magnesium: 2 mg/dL (ref 1.5–2.5)

## 2014-02-14 NOTE — Progress Notes (Signed)
PROGRESS NOTE  Jolena Kittle ZOX:[id number] DOB: [birth year] DOA: 01/27/2014 PCP: No primary care provider on file.  HPI: 43 y/o female with hx HTN, admitted 10/21 with AMS/?seizures, diagnosed as HSV encephalitis as well as PNA (?aspiration) and new dx HIV. Followed by neurology and ID and rx with acyclovir, PCP rx. On 10/28 developed worsening fevers, tachypnea/resp failure and PCCM consulted for tx ICU. Further w/u showed HIV pos with CD4 =10 (new diagnosis).   Patient with a very complicated course, initially admitted with acute encephalopathy on the hospitalist service, then decompensating from a respiratory standpoint and it was the ICU and ended up being intubated 10/28. Patient's respiratory status has gradually improved and was able to be extubated on 11/5, and currently she is comfortable on room air. Her course was complicated by acute renal failure consult to be multifactorial due to nephrotoxic agents, ATN. 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 Her acute encephalopathy has been very slow to improve and she has intermittent periods of confusion alternating with alertness. Infectious disease, nephrology, PCCM and as well as palliative care had been involved in her care and are following.  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 Otherwise neg acute.  10/22 EEG >>> normal drowsy and asleep electroencephalogram. No epileptiform activity is noted.  10/22 ECHO >>> EF 55-60%, grade 1 diastolic dysfunction, no RWMA, small free-flowing pericardial effusion 10/28 Worsening tachypnea, AMS, tx ICU. Required intubation  10/30 Renal US >> kidneys diffusely echogenic, concerning for medical renal disease 10/31 Remains on full support, fent gtt, received 1 unit PRBC's  11/01  More awake, anemia improved 11/02 CXR Mild infiltrate right lower lobe cannot be excluded  11/02 ID started ARV therapy . Renal - broad ddx for ATN and unstable for biopsy 02/11/14: cognition and breathing status continue to improve . 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)   Pyrexia   Oral thrush   PCP (pneumocystis carinii pneumonia)   Protein-calorie malnutrition, severe   Diarrhea   Acute encephalopathy   Acute renal failure with tubular necrosis   Altered mental status   Acute renal failure syndrome   Acute respiratory failure with hypoxia   Ventilator dependence   Acute hypoxemic respiratory failure - patient intubated from 10/28 until 11/5 - Now extubated, breathing comfortably on room air - last CXR 11/5 clear HSV encephalitis - continue antibiotics per ID, on psychological IV Acute encephalopathy - due to encephalitis, prolonged ICU stay, she is improving this morning Elevated troponin level - in the setting of demand ischemia Acute on chronic diastolic heart failure - she appears euvolemic  Acute on chronic renal failure with metabolic acidosis - nephrology following Protein calorie malnutrition - nutrition consult Pneumocystis pneumonia - continue antibiotics and steroids - she is on clindamycin,  primaquine 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 - on fluconazole weekly Seizure disorder - she is on Keppra Hypertension - continue labetalol  Diet: regular Fluids: none  DVT Prophylaxis: heparin  Code Status: Full Family Communication: none bedside  Disposition Plan: remain inpatient  Consultants:  ID  Nephrology  PCCM  Procedures:  2D echo - normal EF 55-60%, grade 1 diastolic dysfunction  Antibiotics Day 16/21 acyclovir Day 12/21 pneumocystis therapy Day 9 Mycobacterium avium therapy   Studies  CXR 11/5 There is no evidence of pneumonia nor other acute cardiopulmonary abnormality.  Objective  Filed Vitals:   02/13/14 2121 02/14/14 0238 02/14/14 0434 02/14/14 0614  BP: 171/90 163/79  155/89  Pulse: 79 72  77  Temp:    96.9 F (36.1 C)  TempSrc:    Axillary  Resp: 18   17  Height:      Weight:   50.44 kg (111 lb 3.2 oz)   SpO2: 100%   100%    Intake/Output Summary (Last 24 hours) at 02/14/14 0854 Last data filed at 02/14/14 0634  Gross per 24 hour  Intake  959.4 ml  Output   1900 ml  Net -940.6 ml   Filed Weights   02/12/14 0500 02/13/14 0500 02/14/14 0434  Weight: 57 kg (125 lb 10.6 oz) 56.6 kg (124 lb 12.5 oz) 50.44 kg (111 lb 3.2 oz)    Exam:  General:  NAD  Cardiovascular: RRR  Respiratory: CTA biL on anterior auscultation  Abdomen: soft, nontender  MSK: no peripheral edema  Neuro: she is alert and oriented 3, somewhat slow to respond to questions  Data Reviewed: Basic Metabolic Panel:  Recent Labs Lab 02/10/14 0433 02/10/14 0700 02/11/14 0400 02/12/14 0500 02/13/14 0412 02/13/14 0415 02/14/14 0605  NA 143  --  145 145 138  --  142  K 5.6*  --  5.0 5.3 4.5  --  4.3  CL 107  --  104 103 99  --  103  CO2 22  --  23 26 25   --  23  GLUCOSE 128*  --  156* 163* 107*  --  90  BUN 91*  --  95* 99* 105*  --  113*    CREATININE 3.57*  --  3.31* 3.20* 2.94*  --  3.01*  CALCIUM 8.7  --  8.7 8.8 8.5  --  8.8  MG  --   --   --   --   --  2.1 2.0  PHOS  --  7.9* 7.4* 7.7* 6.6*  --  7.8*   Liver Function Tests:  Recent Labs Lab 02/08/14 0443  02/09/14 0400 02/11/14 0400 02/12/14 0500 02/13/14 0412 02/14/14 0605  AST 25  --   --   --   --   --   --   ALT 28  --   --   --   --   --   --   ALKPHOS 82  --   --   --   --   --   --   BILITOT <0.2*  --   --   --   --   --   --   PROT 6.4  --   --   --   --   --   --  ALBUMIN 1.7*  < > 1.6* 2.0* 1.9* 1.7* 1.7*  < > = values in this interval not displayed. No results for input(s): LIPASE, AMYLASE in the last 168 hours. No results for input(s): AMMONIA in the last 168 hours. CBC:  Recent Labs Lab 02/10/14 0433 02/11/14 0400 02/12/14 0410 02/13/14 0415 02/14/14 0605  WBC 9.6 10.8* 8.0 6.3 5.4  HGB 8.8* 9.9* 8.5* 9.0* 8.9*  HCT 26.6* 30.1* 26.3* 27.0* 27.1*  MCV 76.4* 78.8 78.0 76.9* 78.8  PLT 338 405* 323 306 294   CBG:  Recent Labs Lab 02/13/14 1148 02/13/14 1528 02/13/14 1952 02/14/14 02/14/14 0335  GLUCAP 103* 99 89 86 71    Recent Results (from the past 240 hour(s))  Culture, bal-quantitative     Status: None   Collection Time: 02/04/14 10:31 AM  Result Value Ref Range Status   Specimen Description BRONCHIAL ALVEOLAR LAVAGE  Final   Special Requests Immunocompromised  Final   Gram Stain   Final    FEW WBC PRESENT, PREDOMINANTLY MONONUCLEAR RARE SQUAMOUS EPITHELIAL CELLS PRESENT NO ORGANISMS SEEN Performed at [facility] Count NO GROWTH Performed at [facility]  Final   Culture   Final    NO GROWTH 2 DAYS Performed at [facility]   Report Status 02/06/2014 FINAL  Final  M. Tuberculosis complex by PCR     Status: None   Collection Time: 02/06/14  6:14 PM  Result Value Ref Range Status   M. tuberculosis, Direct Not detected Not detected Final    Comment: (NOTE) This test(s) was  developed and its performance characteristics  have been determined by [employer] [facility] [employer],  [place], [place]. Performance characteristics refer to the analytical  performance of the test.    Source (MTBPCR) Sputum  Corrected    Comment: Performed at [facility] CORRECTED ON 11/06 AT 0442: PREVIOUSLY REPORTED AS SPUTUM   AFB culture with smear     Status: None (Preliminary result)   Collection Time: 02/06/14  6:14 PM  Result Value Ref Range Status   Specimen Description SPUTUM  Final   Special Requests NONE  Final   Acid Fast Smear   Final    1+ ACID FAST BACILLI SEEN REFER TO PREVIOUS CULTURE [id number] FOR CALLS Performed at [facility]    Culture   Final    CULTURE WILL BE EXAMINED FOR 6 WEEKS BEFORE ISSUING A FINAL REPORT Performed at [facility]    Report Status PENDING  Incomplete  Culture, blood (routine x 2)     Status: None (Preliminary result)   Collection Time: 02/07/14  6:26 PM  Result Value Ref Range Status   Specimen Description BLOOD LEFT ARM  Final   Special Requests BOTTLES DRAWN AEROBIC AND ANAEROBIC 10CC  Final   Culture  Setup Time   Final    02/08/2014 02:25 Performed at [facility]    Culture   Final           BLOOD CULTURE RECEIVED NO GROWTH TO DATE CULTURE WILL BE HELD FOR 5 DAYS BEFORE ISSUING A FINAL NEGATIVE REPORT Performed at [facility]    Report Status PENDING  Incomplete  Culture, blood (routine x 2)     Status: None (Preliminary result)   Collection Time: 02/07/14  6:32 PM  Result Value Ref Range Status   Specimen Description BLOOD BLOOD LEFT FOREARM  Final   Special Requests BOTTLES DRAWN AEROBIC ONLY 10CC  Final  Culture  Setup Time   Final    02/08/2014 02:25 [facility]    Culture   Final           BLOOD CULTURE RECEIVED NO GROWTH TO DATE CULTURE WILL BE HELD FOR 5 DAYS BEFORE ISSUING A FINAL NEGATIVE REPORT [facility]    Report  Status PENDING  Incomplete     Scheduled Meds: . abacavir  600 mg Per NG tube Daily  . acyclovir  10 mg/kg Intravenous Q24H  . aspirin  81 mg Oral Daily  . azithromycin  500 mg Oral Daily  . clindamycin (CLEOCIN) IV  600 mg Intravenous 3 times per day  . dolutegravir  50 mg Oral BID  . ethambutol  800 mg Per Tube Q24H  . [START ON 02/20/2014] fluconazole  100 mg Per Tube Weekly  . heparin subcutaneous  5,000 Units Subcutaneous 3 times per day  . labetalol  200 mg Oral BID  . lamiVUDine  100 mg Oral Daily  . levETIRAcetam  500 mg Intravenous Q12H  . methylPREDNISolone (SOLU-MEDROL) injection  40 mg Intravenous Q12H  . pantoprazole  40 mg Oral Daily  . primaquine  30 mg Per Tube Q24H  . rifabutin  150 mg Oral Daily   Continuous Infusions:   Time spent: 45 minutes  Pamella Pertostin Margene Cherian, MD [facility] Pager [phone]. If 7 PM - 7 AM, please contact night-coverage at [email or web address], password [email or web address] 02/14/2014, 8:54 AM  LOS: 18 days

## 2014-02-14 NOTE — Progress Notes (Signed)
ANTIBIOTIC CONSULT NOTE - FOLLOW UP  Pharmacy Consult for Clinda and Primaquine, Acyclovir, Fluconazole, Azithromycin, Ethambutol, Rifabutin Indication: PCP, HSV encephalitis, thrush, MAC  Allergies  Allergen Reactions  . Ace Inhibitors Anaphylaxis  . Oxycodone Other (See Comments)    Patient Measurements: Height: _0  (170.2 cm) Weight: 111 lb 3.2 oz (50.44 kg) IBW/kg (Calculated) : 61.6   Vital Signs: Temp: 96.9 F (36.1 C) (11/08 0614) Temp Source: Axillary (11/08 0614) BP: 158/88 mmHg (11/08 0918) Pulse Rate: 79 (11/08 0918) Intake/Output from previous day: 11/07 0701 - 11/08 0700 In: 1009.4 [P.O.:600; I.V.:150; IV Piggyback:259.4] Out: 2000 [Urine:1300; Stool:700] Intake/Output from this shift: Total I/O In: -  Out: 1 [Stool:1]  Labs:  Recent Labs  02/12/14 0410 02/12/14 0500 02/13/14 0412 02/13/14 0415 02/14/14 0605  WBC 8.0  --   --  6.3 5.4  HGB 8.5*  --   --  9.0* 8.9*  PLT 323  --   --  306 294  CREATININE  --  3.20* 2.94*  --  3.01*   Estimated Creatinine Clearance: 19 mL/min (by C-G formula based on Cr of 3.01). No results for input(s): VANCOTROUGH, VANCOPEAK, VANCORANDOM, GENTTROUGH, GENTPEAK, GENTRANDOM, TOBRATROUGH, TOBRAPEAK, TOBRARND, AMIKACINPEAK, AMIKACINTROU, AMIKACIN in the last 72 hours.   Microbiology: Recent Results (from the past 720 hour(s))  Urine culture     Status: None   Collection Time: 01/27/14  3:26 PM  Result Value Ref Range Status   Specimen Description URINE, CATHETERIZED  Final   Special Requests NONE  Final   Culture  Setup Time   Final    01/27/2014 22:22 Performed at [place] Performed at [facility]  Final   Culture NO GROWTH Performed at [facility]  Final   Report Status 01/28/2014 FINAL  Final  Culture, blood (routine x 2)     Status: None   Collection Time: 01/29/14  9:19 AM  Result Value Ref Range Status   Specimen Description BLOOD LEFT ARM  Final    Special Requests BOTTLES DRAWN AEROBIC AND ANAEROBIC 10CC  Final   Culture  Setup Time   Final    01/29/2014 15:04 Performed at [place]   Final    NO GROWTH 5 DAYS Performed at [facility]   Report Status 02/04/2014 FINAL  Final  Culture, blood (routine x 2)     Status: None   Collection Time: 01/29/14  9:30 AM  Result Value Ref Range Status   Specimen Description BLOOD LEFT HAND  Final   Special Requests BOTTLES DRAWN AEROBIC AND ANAEROBIC 5CC  Final   Culture  Setup Time   Final    01/29/2014 15:04 Performed at [place]   Final    NO GROWTH 5 DAYS Performed at [facility]   Report Status 02/04/2014 FINAL  Final  Clostridium Difficile by PCR     Status: None   Collection Time: 01/29/14 11:10 AM  Result Value Ref Range Status   C difficile by pcr NEGATIVE NEGATIVE Final  MRSA PCR Screening     Status: Abnormal   Collection Time: 01/29/14  3:40 PM  Result Value Ref Range Status   MRSA by PCR POSITIVE (A) NEGATIVE Final    Comment:        The GeneXpert MRSA Assay (FDA approved for NASAL specimens only), is one component of a comprehensive MRSA colonization surveillance program. It is not intended  to diagnose MRSA infection nor to guide or monitor treatment for MRSA infections. RESULT CALLED TO, READ BACK BY AND VERIFIED WITH: Sherry Ruffing RN 17:50 01/29/14 ([email or web address])  AFB culture, blood     Status: None (Preliminary result)   Collection Time: 01/30/14 11:13 AM  Result Value Ref Range Status   Specimen Description BLOOD LEFT ARM  Final   Special Requests BOTTLES DRAWN AEROBIC ONLY [place]  Final   Culture   Final    CULTURE WILL BE EXAMINED FOR 6 WEEKS BEFORE ISSUING A FINAL REPORT Performed at [facility]   Report Status PENDING  Incomplete  GC/Chlamydia Probe Amp     Status: None   Collection Time: 01/30/14 11:30 AM  Result Value Ref Range Status   CT Probe RNA NEGATIVE NEGATIVE Final   GC Probe RNA  NEGATIVE NEGATIVE Final    Comment: (NOTE)                                                                                       **Normal Reference Range: Negative**      Assay performed using the Gen-Probe APTIMA COMBO2 (R) Assay. Acceptable specimen types for this assay include APTIMA Swabs (Unisex, endocervical, urethral, or vaginal), first void urine, and ThinPrep liquid based cytology samples. Performed at [facility]  CSF culture     Status: None   Collection Time: 01/30/14  5:01 PM  Result Value Ref Range Status   Specimen Description CSF  Final   Special Requests NONE  Final   Gram Stain   Final    CYTOSPIN SLIDE WBC PRESENT, PREDOMINANTLY MONONUCLEAR NO ORGANISMS SEEN Performed at [facility] Performed at [facility]   Culture   Final    NO GROWTH 3 DAYS Performed at [facility]   Report Status 02/03/2014 FINAL  Final  Gram stain     Status: None   Collection Time: 01/30/14  5:01 PM  Result Value Ref Range Status   Specimen Description CSF  Final   Special Requests NONE  Final   Gram Stain   Final    CYTOSPIN SLIDE WBC PRESENT, PREDOMINANTLY MONONUCLEAR NO ORGANISMS SEEN   Report Status 01/30/2014 FINAL  Final  Fungus culture, blood     Status: None   Collection Time: 02/01/14  2:00 PM  Result Value Ref Range Status   Specimen Description BLOOD LEFT HAND  Final   Special Requests BOTTLES DRAWN AEROBIC AND ANAEROBIC 10CC  Final   Culture   Final    NO GROWTH 7 DAYS Performed at [facility]    Report Status 02/09/2014 FINAL  Final  Culture, blood (routine x 2)     Status: None   Collection Time: 02/02/14 12:07 AM  Result Value Ref Range Status   Specimen Description BLOOD RIGHT HAND  Final   Special Requests BOTTLES DRAWN AEROBIC ONLY [facility]  Final   Culture  Setup Time   Final    02/02/2014 03:55 Performed at [place]   Final    NO GROWTH 5 DAYS Performed at [facility]    Report  Status  02/08/2014 FINAL  Final  Culture, blood (routine x 2)     Status: None   Collection Time: 02/02/14 12:18 AM  Result Value Ref Range Status   Specimen Description BLOOD LEFT HAND  Final   Special Requests BOTTLES DRAWN AEROBIC AND ANAEROBIC [place]  Final   Culture  Setup Time   Final    02/02/2014 03:55 Performed at [place]   Final    NO GROWTH 5 DAYS Performed at [facility]    Report Status 02/08/2014 FINAL  Final  Pneumocystis smear by DFA     Status: None   Collection Time: 02/03/14  2:30 PM  Result Value Ref Range Status   Specimen Source-PJSRC SPUTUM  Final   Pneumocystis jiroveci Ag POSITIVE  Final    Comment: Performed at [place] RN 02/04/14 1444 BY J.[email or web address]  Culture, respiratory (NON-Expectorated)     Status: None   Collection Time: 02/04/14  3:50 AM  Result Value Ref Range Status   Specimen Description TRACHEAL ASPIRATE  Final   Special Requests Immunocompromised  Final   Gram Stain   Final    FEW WBC PRESENT,BOTH PMN AND MONONUCLEAR RARE SQUAMOUS EPITHELIAL CELLS PRESENT NO ORGANISMS SEEN Performed at [facility]   Culture   Final    NO GROWTH 2 DAYS Performed at [facility]   Report Status 02/06/2014 FINAL  Final  AFB culture with smear     Status: None (Preliminary result)   Collection Time: 02/04/14  3:50 AM  Result Value Ref Range Status   Specimen Description TRACHEAL ASPIRATE  Final   Special Requests NONE  Final   Acid Fast Smear   Final    1+ ACID FAST BACILLI SEEN CRITICAL RESULT CALLED TO, READ BACK BY AND VERIFIED WITH: CIARFI 14:55 110/30/15 FUENG CRITICAL RESULT CALLED TO, READ BACK BY AND VERIFIED WITH: LEE ANN STIMPSON H.D. 10:30 02/08/14 FUENG Performed at [facility]    Culture   Final    CULTURE WILL BE EXAMINED FOR 6 WEEKS BEFORE ISSUING A FINAL REPORT Performed at [facility]    Report Status PENDING  Incomplete   Fungus Culture with Smear     Status: None (Preliminary result)   Collection Time: 02/04/14  3:50 AM  Result Value Ref Range Status   Specimen Description TRACHEAL ASPIRATE  Final   Special Requests NONE  Final   Fungal Smear   Final    NO YEAST OR FUNGAL ELEMENTS SEEN Performed at [facility]    Culture   Final    CANDIDA ALBICANS Performed at [facility]    Report Status PENDING  Incomplete  Respiratory virus panel     Status: None   Collection Time: 02/04/14  3:50 AM  Result Value Ref Range Status   Source - RVPAN TRACHEAL ASPIRATE  Final   Respiratory Syncytial Virus A NOT DETECTED  Final   Respiratory Syncytial Virus B NOT DETECTED  Final   Influenza A NOT DETECTED  Final   Influenza B NOT DETECTED  Final   Parainfluenza 1 NOT DETECTED  Final   Parainfluenza 2 NOT DETECTED  Final   Parainfluenza 3 NOT DETECTED  Final   Metapneumovirus NOT DETECTED  Final   Rhinovirus NOT DETECTED  Final   Adenovirus NOT DETECTED  Final   Influenza A H1 NOT DETECTED  Final   Influenza A H3 NOT  DETECTED  Final    Comment: (NOTE)       Normal Reference Range for each Analyte: NOT DETECTED Testing performed using the Luminex xTAG Respiratory Viral Panel test kit. The analytical performance characteristics of this assay have been determined by [facility].  The modifications have not been cleared or approved by the FDA. This assay has been validated pursuant to the CLIA regulations and is used for clinical purposes. Performed at [facility], bal-quantitative     Status: None   Collection Time: 02/04/14 10:31 AM  Result Value Ref Range Status   Specimen Description BRONCHIAL ALVEOLAR LAVAGE  Final   Special Requests Immunocompromised  Final   Gram Stain   Final    FEW WBC PRESENT, PREDOMINANTLY MONONUCLEAR RARE SQUAMOUS EPITHELIAL CELLS PRESENT NO ORGANISMS SEEN Performed at [place] Performed at  [facility]  Final   Culture   Final    NO GROWTH 2 DAYS Performed at [facility]   Report Status 02/06/2014 FINAL  Final  M. Tuberculosis complex by PCR     Status: None   Collection Time: 02/06/14  6:14 PM  Result Value Ref Range Status   M. tuberculosis, Direct Not detected Not detected Final    Comment: (NOTE) This test(s) was developed and its performance characteristics  have been determined by [employer],  [place], [place]. Performance characteristics refer to the analytical  performance of the test.    Source (MTBPCR) Sputum  Corrected    Comment: Performed at [place] 11/06 AT 0442: PREVIOUSLY REPORTED AS SPUTUM   AFB culture with smear     Status: None (Preliminary result)   Collection Time: 02/06/14  6:14 PM  Result Value Ref Range Status   Specimen Description SPUTUM  Final   Special Requests NONE  Final   Acid Fast Smear   Final    1+ ACID FAST BACILLI SEEN REFER TO PREVIOUS CULTURE [id number] FOR CALLS Performed at [facility]    Culture   Final    CULTURE WILL BE EXAMINED FOR 6 WEEKS BEFORE ISSUING A FINAL REPORT Performed at [facility]    Report Status PENDING  Incomplete  Culture, blood (routine x 2)     Status: None (Preliminary result)   Collection Time: 02/07/14  6:26 PM  Result Value Ref Range Status   Specimen Description BLOOD LEFT ARM  Final   Special Requests BOTTLES DRAWN AEROBIC AND ANAEROBIC 10CC  Final   Culture  Setup Time   Final    02/08/2014 02:25 Performed at [facility]    Culture   Final           BLOOD CULTURE RECEIVED NO GROWTH TO DATE CULTURE WILL BE HELD FOR 5 DAYS BEFORE ISSUING A FINAL NEGATIVE REPORT Performed at [facility]    Report Status PENDING  Incomplete  Culture, blood (routine x 2)     Status: None (Preliminary result)   Collection Time: 02/07/14  6:32 PM  Result Value Ref Range Status   Specimen Description BLOOD  BLOOD LEFT FOREARM  Final   Special Requests BOTTLES DRAWN AEROBIC ONLY 10CC  Final   Culture  Setup Time   Final    02/08/2014 02:25 Performed at [facility]    Culture   Final           BLOOD CULTURE RECEIVED NO GROWTH TO  DATE CULTURE WILL BE HELD FOR 5 DAYS BEFORE ISSUING A FINAL NEGATIVE REPORT Performed at [facility]    Report Status PENDING  Incomplete    Anti-infectives    Start     Dose/Rate Route Frequency Ordered Stop   02/20/14 1000  fluconazole (DIFLUCAN) 40 MG/ML suspension 100 mg     100 mg Per Tube Weekly 02/13/14 1213     02/13/14 1300  azithromycin (ZITHROMAX) tablet 500 mg     500 mg Oral Daily 02/13/14 1206     02/09/14 1000  lamiVUDine (EPIVIR) 10 MG/ML solution 100 mg     100 mg Oral Daily 02/08/14 1149     02/08/14 1600  azithromycin (ZITHROMAX) tablet 1,200 mg  Status:  Discontinued     1,200 mg Per Tube Weekly 02/03/14 0955 02/03/14 1012   02/08/14 1300  abacavir (ZIAGEN) tablet 600 mg     600 mg Per NG tube Daily 02/08/14 1115     02/08/14 1200  lamiVUDine (EPIVIR) 10 MG/ML solution 150 mg     150 mg Per Tube  Once 02/08/14 1115 02/08/14 1320   02/08/14 1200  dolutegravir (TIVICAY) tablet 50 mg    Comments:  Per NG TUBE   50 mg Oral 2 times daily 02/08/14 1115     02/08/14 1000  azithromycin (ZITHROMAX) 200 MG/5ML suspension 1,200 mg  Status:  Discontinued     1,200 mg Oral Weekly 02/03/14 1013 02/03/14 1015   02/08/14 1000  azithromycin (ZITHROMAX) 200 MG/5ML suspension 1,200 mg  Status:  Discontinued     1,200 mg Per Tube Weekly 02/03/14 1015 02/05/14 1542   02/06/14 1200  acyclovir (ZOVIRAX) 470 mg in dextrose 5 % 100 mL IVPB     10 mg/kg  47 kg109.4 mL/hr over 60 Minutes Intravenous Every 24 hours 02/06/14 1110     02/05/14 2200  clindamycin (CLEOCIN) IVPB 600 mg     600 mg100 mL/hr over 30 Minutes Intravenous 3 times per day 02/05/14 1542     02/05/14 2200  primaquine tablet 30 mg     30 mg Per Tube Every 24 hours 02/05/14 1542      02/05/14 1700  azithromycin (ZITHROMAX) 200 MG/5ML suspension 600 mg  Status:  Discontinued     600 mg Per Tube Daily 02/05/14 1542 02/13/14 1206   02/05/14 1700  ethambutol (MYAMBUTOL) tablet 800 mg  Status:  Discontinued     800 mg Oral Every 24 hours 02/05/14 1542 02/05/14 1545   02/05/14 1700  ethambutol (MYAMBUTOL) tablet 800 mg     800 mg Per Tube Every 24 hours 02/05/14 1545     02/05/14 1600  rifabutin (MYCOBUTIN) capsule 150 mg     150 mg Oral Daily 02/05/14 1542     02/05/14 1200  acyclovir (ZOVIRAX) 450 mg in dextrose 5 % 100 mL IVPB  Status:  Discontinued     10 mg/kg  45 kg109 mL/hr over 60 Minutes Intravenous Every 24 hours 02/04/14 1554 02/04/14 1601   02/05/14 1200  acyclovir (ZOVIRAX) 400 mg in dextrose 5 % 100 mL IVPB  Status:  Discontinued     10 mg/kg  40 kg (Order-Specific)108 mL/hr over 60 Minutes Intravenous Every 24 hours 02/04/14 1601 02/06/14 1110   02/05/14 1200  sulfamethoxazole-trimethoprim (BACTRIM) 240 mg of trimethoprim in dextrose 5 % 250 mL IVPB  Status:  Discontinued     240 mg of trimethoprim265 mL/hr over 60 Minutes Intravenous Every 12 hours 02/05/14 1156 02/05/14 1510  02/05/14 1000  fluconazole (DIFLUCAN) 40 MG/ML suspension 100 mg  Status:  Discontinued     100 mg Per Tube Daily 02/04/14 1759 02/13/14 1213   02/04/14 2000  piperacillin-tazobactam (ZOSYN) IVPB 2.25 g     2.25 g100 mL/hr over 30 Minutes Intravenous Every 8 hours 02/04/14 1613 02/04/14 2121   02/04/14 1100  ganciclovir (CYTOVENE) 55 mg in sodium chloride 0.9 % 100 mL IVPB  Status:  Discontinued     1.25 mg/kg  45 kg100 mL/hr over 60 Minutes Intravenous Every 24 hours 02/04/14 1007 02/04/14 1450   02/04/14 1000  fluconazole (DIFLUCAN) tablet 400 mg  Status:  Discontinued     400 mg Per Tube Daily 02/03/14 0955 02/03/14 1015   02/04/14 1000  fluconazole (DIFLUCAN) 40 MG/ML suspension 400 mg  Status:  Discontinued     400 mg Per Tube Daily 02/03/14 1015 02/03/14 1136   02/04/14 1000   fluconazole (DIFLUCAN) 40 MG/ML suspension 200 mg  Status:  Discontinued     200 mg Per Tube Daily 02/03/14 1136 02/04/14 1759   02/04/14 1000  acyclovir (ZOVIRAX) 400 mg in dextrose 5 % 100 mL IVPB  Status:  Discontinued     10 mg/kg  40 kg108 mL/hr over 60 Minutes Intravenous Every 24 hours 02/03/14 1412 02/04/14 0952   02/04/14 0000  sulfamethoxazole-trimethoprim (BACTRIM) 200 mg of trimethoprim in dextrose 5 % 250 mL IVPB  Status:  Discontinued     5 mg/kg of trimethoprim  40 kg262.5 mL/hr over 60 Minutes Intravenous Every 12 hours 02/03/14 1412 02/05/14 1156   02/03/14 2000  piperacillin-tazobactam (ZOSYN) IVPB 2.25 g  Status:  Discontinued     2.25 g100 mL/hr over 30 Minutes Intravenous Every 8 hours 02/03/14 1412 02/04/14 1613   02/02/14 1400  sulfamethoxazole-trimethoprim (BACTRIM) 200 mg in dextrose 5 % 250 mL IVPB  Status:  Discontinued     15 mg/kg/day  40 kg262.5 mL/hr over 60 Minutes Intravenous 3 times per day 02/02/14 1354 02/03/14 1412   02/02/14 1300  fluconazole (DIFLUCAN) tablet 400 mg  Status:  Discontinued     400 mg Oral Daily 02/02/14 1026 02/03/14 0955   02/01/14 1700  sulfamethoxazole-trimethoprim (BACTRIM DS) 800-160 MG per tablet 1 tablet  Status:  Discontinued     1 tablet Oral Once per day on Mon Wed Fri 02/01/14 1549 02/02/14 1316   02/01/14 1600  azithromycin (ZITHROMAX) tablet 1,200 mg  Status:  Discontinued     1,200 mg Oral Weekly 02/01/14 1515 02/03/14 0955   02/01/14 1000  acyclovir (ZOVIRAX) 420 mg in dextrose 5 % 100 mL IVPB  Status:  Discontinued     10 mg/kg  41.9 kg108.4 mL/hr over 60 Minutes Intravenous Every 12 hours 02/01/14 0909 02/03/14 1412   02/01/14 1000  vancomycin (VANCOCIN) IVPB 750 mg/150 ml premix  Status:  Discontinued     750 mg150 mL/hr over 60 Minutes Intravenous Every 24 hours 02/01/14 0910 02/03/14 0910   02/01/14 0823  vancomycin (VANCOCIN) 500 mg in sodium chloride 0.9 % 100 mL IVPB  Status:  Discontinued     500 mg100 mL/hr over  60 Minutes Intravenous Every 24 hours 01/31/14 1029 02/01/14 0909   01/31/14 1200  piperacillin-tazobactam (ZOSYN) IVPB 3.375 g  Status:  Discontinued     3.375 g12.5 mL/hr over 240 Minutes Intravenous Every 8 hours 01/31/14 1029 02/03/14 1412   01/30/14 2100  acyclovir (ZOVIRAX) 390 mg in dextrose 5 % 100 mL IVPB  Status:  Discontinued  10 mg/kg  39.1 kg107.8 mL/hr over 60 Minutes Intravenous Every 24 hours 01/30/14 2018 02/01/14 0908   01/29/14 1600  acyclovir (ZOVIRAX) 420 mg in dextrose 5 % 100 mL IVPB  Status:  Discontinued     420 mg108.4 mL/hr over 60 Minutes Intravenous Every 24 hours 01/29/14 1457 01/29/14 2015   01/29/14 0800  cefTRIAXone (ROCEPHIN) 1 g in dextrose 5 % 50 mL IVPB  Status:  Discontinued     1 g100 mL/hr over 30 Minutes Intravenous Every 24 hours 01/29/14 0707 01/29/14 0710   01/29/14 0800  vancomycin (VANCOCIN) 500 mg in sodium chloride 0.9 % 100 mL IVPB  Status:  Discontinued     500 mg100 mL/hr over 60 Minutes Intravenous Every 48 hours 01/29/14 0719 01/31/14 1029   01/29/14 0730  piperacillin-tazobactam (ZOSYN) IVPB 2.25 g  Status:  Discontinued     2.25 g100 mL/hr over 30 Minutes Intravenous 4 times per day 01/29/14 0719 01/31/14 1028   01/29/14 0715  azithromycin (ZITHROMAX) 500 mg in dextrose 5 % 250 mL IVPB  Status:  Discontinued     500 mg250 mL/hr over 60 Minutes Intravenous Every 24 hours 01/29/14 0704 01/29/14 0710      Assessment: 78 yoF with newly diagnosed HIV. 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  ID is following. On Abacavir, Tivicay, and Epivir for HIV.  Her SCr peaked on 11/3 at 3.78 and has since trended down to 3.01, stable. Urine output is stable at 1.1. Weight has increased about 9 lbs. But is likely due to fluid retention.  No medication adjustments needed today.   Goal of  Therapy:  Appropriate antibiotic dosing Clinical resolution of infection  Plan:  Diflucan 169m PT q24h (thrush) Acyclovir [id number] (HSV) - + 9 lbs (stable - likely fluid, not adj w/ renal failure Clinda 60035mV q8h/Primaquine 39m52m qd (PCP) Azithro to 600mg69mdaily (MAC) Ethambutol 800mg 81maily (MAC) Rifabutin 150mg P64mily (MAC) Abacavir 600 mg, Tivicay 50 mg, Epivir 100 mg (HIV) Monitor micro data, weight and renal function and adjust medications as needed Monitor Qtc interval  Camielle Sizer BElicia LampD Clinical Pharmacist - Resident Pager [phone] 3:45 PM

## 2014-02-14 NOTE — Progress Notes (Signed)
PT Cancellation Note  Patient Details Name: Desiree Hale MRN: [id number] DOB: Apr 04, 1971   Cancelled Treatment:    Reason Eval/Treat Not Completed: Other (comment).  Attempted to see patient x2.  Patient eating and on second attempt had just gone to sleep.  Will return tomorrow for PT re-eval.   Vena AustriaDavis, Zaylah Blecha H 02/14/2014, 8:09 PM Durenda HurtSusan H. Renaldo Fiddleravis, PT, [facility] Hospital[facility] Pager [phone]

## 2014-02-15 DIAGNOSIS — G0490 Encephalitis and encephalomyelitis, unspecified: Secondary | ICD-10-CM | POA: Insufficient documentation

## 2014-02-15 LAB — BASIC METABOLIC PANEL
ANION GAP: 18 — AB (ref 5–15)
BUN: 119 mg/dL — ABNORMAL HIGH (ref 6–23)
CALCIUM: 8.6 mg/dL (ref 8.4–10.5)
CHLORIDE: 103 meq/L (ref 96–112)
CO2: 22 meq/L (ref 19–32)
Creatinine, Ser: 3.16 mg/dL — ABNORMAL HIGH (ref 0.50–1.10)
GFR calc Af Amer: 19 mL/min — ABNORMAL LOW (ref >90)
GFR calc non Af Amer: 17 mL/min — ABNORMAL LOW (ref >90)
Glucose, Bld: 105 mg/dL — ABNORMAL HIGH (ref 70–99)
Potassium: 4.7 mEq/L (ref 3.7–5.3)
SODIUM: 143 meq/L (ref 137–147)

## 2014-02-15 LAB — CULTURE, BLOOD (ROUTINE X 2)
Culture: NO GROWTH
Culture: NO GROWTH

## 2014-02-15 LAB — CBC
HEMATOCRIT: 25.4 % — AB (ref 36.0–46.0)
Hemoglobin: 8.3 g/dL — ABNORMAL LOW (ref 12.0–15.0)
MCH: 25.8 pg — ABNORMAL LOW (ref 26.0–34.0)
MCHC: 32.7 g/dL (ref 30.0–36.0)
MCV: 78.9 fL (ref 78.0–100.0)
Platelets: 269 10*3/uL (ref 150–400)
RBC: 3.22 MIL/uL — ABNORMAL LOW (ref 3.87–5.11)
RDW: 17 % — AB (ref 11.5–15.5)
WBC: 6.1 10*3/uL (ref 4.0–10.5)

## 2014-02-15 LAB — MISCELLANEOUS TEST

## 2014-02-15 LAB — GLUCOSE, CAPILLARY
GLUCOSE-CAPILLARY: 112 mg/dL — AB (ref 70–99)
Glucose-Capillary: 106 mg/dL — ABNORMAL HIGH (ref 70–99)
Glucose-Capillary: 109 mg/dL — ABNORMAL HIGH (ref 70–99)
Glucose-Capillary: 112 mg/dL — ABNORMAL HIGH (ref 70–99)
Glucose-Capillary: 136 mg/dL — ABNORMAL HIGH (ref 70–99)
Glucose-Capillary: 94 mg/dL (ref 70–99)
Glucose-Capillary: 94 mg/dL (ref 70–99)

## 2014-02-15 LAB — MAGNESIUM: Magnesium: 2 mg/dL (ref 1.5–2.5)

## 2014-02-15 MED ORDER — AZITHROMYCIN 600 MG PO TABS
1200.0000 mg | ORAL_TABLET | ORAL | Status: DC
  Administered 2014-02-16: 1200 mg via ORAL
  Filled 2014-02-15 (×2): qty 2

## 2014-02-15 MED ORDER — CLINDAMYCIN HCL 300 MG PO CAPS
600.0000 mg | ORAL_CAPSULE | Freq: Three times a day (TID) | ORAL | Status: DC
  Administered 2014-02-15 – 2014-02-23 (×24): 600 mg via ORAL
  Filled 2014-02-15 (×27): qty 2

## 2014-02-15 MED ORDER — PANTOPRAZOLE SODIUM 40 MG PO TBEC
40.0000 mg | DELAYED_RELEASE_TABLET | Freq: Every day | ORAL | Status: DC
  Administered 2014-02-15 – 2014-02-23 (×8): 40 mg via ORAL
  Filled 2014-02-15 (×7): qty 1

## 2014-02-15 MED ORDER — PREDNISONE 20 MG PO TABS
40.0000 mg | ORAL_TABLET | Freq: Every day | ORAL | Status: DC
  Administered 2014-02-16 – 2014-02-17 (×2): 40 mg via ORAL
  Filled 2014-02-15 (×3): qty 2

## 2014-02-15 MED ORDER — ENSURE COMPLETE PO LIQD
237.0000 mL | Freq: Two times a day (BID) | ORAL | Status: DC
  Administered 2014-02-16 – 2014-02-22 (×9): 237 mL via ORAL

## 2014-02-15 MED ORDER — METHYLPREDNISOLONE SODIUM SUCC 40 MG IJ SOLR
40.0000 mg | Freq: Every day | INTRAMUSCULAR | Status: DC
Start: 2014-02-16 — End: 2014-02-15

## 2014-02-15 NOTE — Plan of Care (Signed)
Problem: Phase II Progression Outcomes Goal: Tolerating diet Outcome: Completed/Met Date Met:  02/15/14     

## 2014-02-15 NOTE — Progress Notes (Signed)
Patient ID: Desiree Hale, female    DOB: [birth year], 43 y.o.   MRN: [id number] Nephrology Progress Note  S:Moved to [facility]- now on hosp service- they wanted us to comment on pts confusion whether felt to be renal related and also to comment on rising BUN and creatinine  O:BP 140/80 mmHg  Pulse 86  Temp(Src) 98.6 F (37 C) (Oral)  Resp 18  Ht 5\' 7"  (1.702 m)  Wt 50.9 kg (112 lb 3.4 oz)  BMI 17.57 kg/m2  SpO2 96%  LMP   Intake/Output Summary (Last 24 hours) at 02/15/14 1433 Last data filed at 02/15/14 0600  Gross per 24 hour  Intake    610 ml  Output    900 ml  Net   -290 ml   Intake/Output: I/O last 3 completed shifts: In: 610 [P.O.:110; IV Piggyback:500] Out: 1401 [Urine:1400; Stool:1]  Intake/Output this shift:    Weight change: 0.46 kg (1 lb 0.2 oz)  NWG:NFAOZHYQGen:confused HEENT: Left IJ in place. No JVD CVS: Regular rate and rhythm Resp: Clear on anterior auscultation Abd: Soft, not focally tender Ext: minimal edema   Recent Labs Lab 02/08/14 1704 02/09/14 0400 02/10/14 0433 02/10/14 0700 02/11/14 0400 02/12/14 0500 02/13/14 0412 02/14/14 0605 02/15/14 0525  NA 140 139 143  --  145 145 138 142 143  K 5.2 5.1 5.6*  --  5.0 5.3 4.5 4.3 4.7  CL 109 106 107  --  104 103 99 103 103  CO2 14* 17* 22  --  23 26 25 23 22   GLUCOSE 148* 221* 128*  --  156* 163* 107* 90 105*  BUN 85* 87* 91*  --  95* 99* 105* 113* 119*  CREATININE 3.79* 3.78* 3.57*  --  3.31* 3.20* 2.94* 3.01* 3.16*  ALBUMIN 1.6* 1.6*  --   --  2.0* 1.9* 1.7* 1.7*  --   CALCIUM 8.3* 8.3* 8.7  --  8.7 8.8 8.5 8.8 8.6  PHOS 6.9* 7.1*  --  7.9* 7.4* 7.7* 6.6* 7.8*  --    Liver Function Tests:  Recent Labs Lab 02/12/14 0500 02/13/14 0412 02/14/14 0605  ALBUMIN 1.9* 1.7* 1.7*   No results for input(s): LIPASE, AMYLASE in the last 168 hours. No results for input(s): AMMONIA in the last 168 hours. CBC:  Recent Labs Lab 02/11/14 0400 02/12/14 0410 02/13/14 0415 02/14/14 0605 02/15/14 0525  WBC 10.8* 8.0  6.3 5.4 6.1  HGB 9.9* 8.5* 9.0* 8.9* 8.3*  HCT 30.1* 26.3* 27.0* 27.1* 25.4*  MCV 78.8 78.0 76.9* 78.8 78.9  PLT 405* 323 306 294 269    CBG:  Recent Labs Lab 02/14/14 2037 02/15/14 0046 02/15/14 0500 02/15/14 0852 02/15/14 1210  GLUCAP 109* 106* 112* 94 94   Medications: . abacavir  600 mg Per NG tube Daily  . acyclovir  10 mg/kg Intravenous Q24H  . aspirin  81 mg Oral Daily  . [START ON 02/17/2014] azithromycin  1,200 mg Oral Weekly  . clindamycin  600 mg Oral 3 times per day  . dolutegravir  50 mg Oral BID  . [START ON 02/20/2014] fluconazole  100 mg Per Tube Weekly  . heparin subcutaneous  5,000 Units Subcutaneous 3 times per day  . labetalol  200 mg Oral BID  . lamiVUDine  100 mg Oral Daily  . levETIRAcetam  500 mg Intravenous Q12H  . [START ON 02/16/2014] predniSONE  40 mg Oral Q breakfast  . primaquine  30 mg Per Tube Q24H  Infusions   Results  for Desiree BeamsKING, Desiree (MRN [id number]) as of 02/09/2014 12:21  Ref. 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 She was admitted with an initial creatinine of 3.07 with a transient improvement to 1.45, then progressive worsening,  peaked at 3.78 on 11/3 with slow imp now with some trending up. Exposed to many antiinfectives (some that are nephrotoxic agents) in addition to having newly diagnosed HIV/AIDs that is most likely a contributing factor. Weight on admission of 88lbs improved to 125lbs. Patient encephalopathy appears to be not improving   Assessment/Plan: 1. AKI with metabolic acidosis: Appears to be multifactorial as patient arrived with renal insufficiency  but has also received many nephrotoxic agents . Concern patient may have HIVAN vs HIVICK as a background cause  for CKD with possible superimposedAIN vs ATN. Relative hypotension (big swings in systolic BP) could also have played  a part in patient's worsening kidney function (renal ischemia). Patient has bland U/A at last check.  Renal ultrasound shows echodense kidneys. Urine output remains adequate (1.25L/24hr). ANA and anti-DNA ab wnl. C3 and C4 wnl.  Unstable for renal biopsy, which would be needed to confirm pathology in the future unless full return to normal renal function. Creatinine nadir was 2.94, now back up to 3.16- with BUN back over 100.  Of note, on prednisone currently at 40 mg and also hgb dropping raising the issue of a possible GIB- both these things could lead to worsening azotemia- I will start protonix 2. Hypertension: patient currently on labetalol. 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 Continues to improve 4. HIV/AIDs: recently diagnosed. Unsure if this is a known diagnosis by patient. HIV therapy started 11/2. Will need to be cautious as to not cause further kidney injury. CMV negative. M. tuberculosis negative. 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 5. PCP pneumonia: management per primary and infectious disease 6. Diabetes mellitus: listed in history, although no documented medications. Per primary 7. Mental status- patient has MANY  possible etiologies of confusion, a BUN over 100 could be one of many causes.  It does not make me think that she needs HD necessarily- the tapering of prednisone should help    Desiree Hale A,MD 02/15/2014 2:33 PM

## 2014-02-15 NOTE — Progress Notes (Signed)
[facility]    Day 17 Acyclovir DAy 12 pcp rx  IV clinda/primaquin ) Day 10  emb/rifabutin/azithro  Day 14 fluconazole   Zosyn D/cd after 7 days    Subjective: Alert oriented answering questions   Antibiotics:  Anti-infectives    Start     Dose/Rate Route Frequency Ordered Stop   02/20/14 1000  fluconazole (DIFLUCAN) 40 MG/ML suspension 100 mg     100 mg Per Tube Weekly 02/13/14 1213     02/13/14 1300  azithromycin (ZITHROMAX) tablet 500 mg  Status:  Discontinued     500 mg Oral Daily 02/13/14 1206 02/15/14 1240   02/09/14 1000  lamiVUDine (EPIVIR) 10 MG/ML solution 100 mg     100 mg Oral Daily 02/08/14 1149     02/08/14 1600  azithromycin (ZITHROMAX) tablet 1,200 mg  Status:  Discontinued     1,200 mg Per Tube Weekly 02/03/14 0955 02/03/14 1012   02/08/14 1300  abacavir (ZIAGEN) tablet 600 mg     600 mg Per NG tube Daily 02/08/14 1115     02/08/14 1200  lamiVUDine (EPIVIR) 10 MG/ML solution 150 mg     150 mg Per Tube  Once 02/08/14 1115 02/08/14 1320   02/08/14 1200  dolutegravir (TIVICAY) tablet 50 mg    Comments:  Per NG TUBE   50 mg Oral 2 times daily 02/08/14 1115     02/08/14 1000  azithromycin (ZITHROMAX) 200 MG/5ML suspension 1,200 mg  Status:  Discontinued     1,200 mg Oral Weekly 02/03/14 1013 02/03/14 1015   02/08/14 1000  azithromycin (ZITHROMAX) 200 MG/5ML suspension 1,200 mg  Status:  Discontinued     1,200 mg Per Tube Weekly 02/03/14 1015 02/05/14 1542   02/06/14 1200  acyclovir (ZOVIRAX) 470 mg in dextrose 5 % 100 mL IVPB     10 mg/kg  47 kg109.4 mL/hr over 60 Minutes Intravenous Every 24 hours 02/06/14 1110     02/05/14 2200  clindamycin (CLEOCIN) IVPB 600 mg     600 mg100 mL/hr over 30 Minutes Intravenous 3 times per day 02/05/14 1542     02/05/14 2200  primaquine tablet 30 mg      30 mg Per Tube Every 24 hours 02/05/14 1542     02/05/14 1700  azithromycin (ZITHROMAX) 200 MG/5ML suspension 600 mg  Status:  Discontinued     600 mg Per Tube Daily 02/05/14 1542 02/13/14 1206   02/05/14 1700  ethambutol (MYAMBUTOL) tablet 800 mg  Status:  Discontinued     800 mg Oral Every 24 hours 02/05/14 1542 02/05/14 1545   02/05/14 1700  ethambutol (MYAMBUTOL) tablet 800 mg  Status:  Discontinued     800 mg Per Tube Every 24 hours 02/05/14 1545 02/15/14 1240   02/05/14 1600  rifabutin (MYCOBUTIN) capsule 150 mg  Status:  Discontinued     150 mg Oral Daily 02/05/14 1542 02/15/14 1240   02/05/14 1200  acyclovir (ZOVIRAX) 450 mg in dextrose 5 % 100 mL IVPB  Status:  Discontinued     10 mg/kg  45 kg109 mL/hr over 60 Minutes Intravenous Every 24 hours 02/04/14 1554 02/04/14 1601   02/05/14 1200  acyclovir (ZOVIRAX) 400 mg in dextrose 5 % 100 mL IVPB  Status:  Discontinued     10 mg/kg  40 kg (Order-Specific)108 mL/hr over 60 Minutes Intravenous Every 24 hours 02/04/14 1601 02/06/14 1110   02/05/14 1200  sulfamethoxazole-trimethoprim (BACTRIM) 240 mg of trimethoprim in dextrose  5 % 250 mL IVPB  Status:  Discontinued     240 mg of trimethoprim265 mL/hr over 60 Minutes Intravenous Every 12 hours 02/05/14 1156 02/05/14 1510   02/05/14 1000  fluconazole (DIFLUCAN) 40 MG/ML suspension 100 mg  Status:  Discontinued     100 mg Per Tube Daily 02/04/14 1759 02/13/14 1213   02/04/14 2000  piperacillin-tazobactam (ZOSYN) IVPB 2.25 g     2.25 g100 mL/hr over 30 Minutes Intravenous Every 8 hours 02/04/14 1613 02/04/14 2121   02/04/14 1100  ganciclovir (CYTOVENE) 55 mg in sodium chloride 0.9 % 100 mL IVPB  Status:  Discontinued     1.25 mg/kg  45 kg100 mL/hr over 60 Minutes Intravenous Every 24 hours 02/04/14 1007 02/04/14 1450   02/04/14 1000  fluconazole (DIFLUCAN) tablet 400 mg  Status:  Discontinued     400 mg Per Tube Daily 02/03/14 0955 02/03/14 1015   02/04/14 1000  fluconazole (DIFLUCAN) 40  MG/ML suspension 400 mg  Status:  Discontinued     400 mg Per Tube Daily 02/03/14 1015 02/03/14 1136   02/04/14 1000  fluconazole (DIFLUCAN) 40 MG/ML suspension 200 mg  Status:  Discontinued     200 mg Per Tube Daily 02/03/14 1136 02/04/14 1759   02/04/14 1000  acyclovir (ZOVIRAX) 400 mg in dextrose 5 % 100 mL IVPB  Status:  Discontinued     10 mg/kg  40 kg108 mL/hr over 60 Minutes Intravenous Every 24 hours 02/03/14 1412 02/04/14 0952   02/04/14 0000  sulfamethoxazole-trimethoprim (BACTRIM) 200 mg of trimethoprim in dextrose 5 % 250 mL IVPB  Status:  Discontinued     5 mg/kg of trimethoprim  40 kg262.5 mL/hr over 60 Minutes Intravenous Every 12 hours 02/03/14 1412 02/05/14 1156   02/03/14 2000  piperacillin-tazobactam (ZOSYN) IVPB 2.25 g  Status:  Discontinued     2.25 g100 mL/hr over 30 Minutes Intravenous Every 8 hours 02/03/14 1412 02/04/14 1613   02/02/14 1400  sulfamethoxazole-trimethoprim (BACTRIM) 200 mg in dextrose 5 % 250 mL IVPB  Status:  Discontinued     15 mg/kg/day  40 kg262.5 mL/hr over 60 Minutes Intravenous 3 times per day 02/02/14 1354 02/03/14 1412   02/02/14 1300  fluconazole (DIFLUCAN) tablet 400 mg  Status:  Discontinued     400 mg Oral Daily 02/02/14 1026 02/03/14 0955   02/01/14 1700  sulfamethoxazole-trimethoprim (BACTRIM DS) 800-160 MG per tablet 1 tablet  Status:  Discontinued     1 tablet Oral Once per day on Mon Wed Fri 02/01/14 1549 02/02/14 1316   02/01/14 1600  azithromycin (ZITHROMAX) tablet 1,200 mg  Status:  Discontinued     1,200 mg Oral Weekly 02/01/14 1515 02/03/14 0955   02/01/14 1000  acyclovir (ZOVIRAX) 420 mg in dextrose 5 % 100 mL IVPB  Status:  Discontinued     10 mg/kg  41.9 kg108.4 mL/hr over 60 Minutes Intravenous Every 12 hours 02/01/14 0909 02/03/14 1412   02/01/14 1000  vancomycin (VANCOCIN) IVPB 750 mg/150 ml premix  Status:  Discontinued     750 mg150 mL/hr over 60 Minutes Intravenous Every 24 hours 02/01/14 0910 02/03/14 0910   02/01/14  0823  vancomycin (VANCOCIN) 500 mg in sodium chloride 0.9 % 100 mL IVPB  Status:  Discontinued     500 mg100 mL/hr over 60 Minutes Intravenous Every 24 hours 01/31/14 1029 02/01/14 0909   01/31/14 1200  piperacillin-tazobactam (ZOSYN) IVPB 3.375 g  Status:  Discontinued     3.375 g12.5 mL/hr over 240  Minutes Intravenous Every 8 hours 01/31/14 1029 02/03/14 1412   01/30/14 2100  acyclovir (ZOVIRAX) 390 mg in dextrose 5 % 100 mL IVPB  Status:  Discontinued     10 mg/kg  39.1 kg107.8 mL/hr over 60 Minutes Intravenous Every 24 hours 01/30/14 2018 02/01/14 0908   01/29/14 1600  acyclovir (ZOVIRAX) 420 mg in dextrose 5 % 100 mL IVPB  Status:  Discontinued     420 mg108.4 mL/hr over 60 Minutes Intravenous Every 24 hours 01/29/14 1457 01/29/14 2015   01/29/14 0800  cefTRIAXone (ROCEPHIN) 1 g in dextrose 5 % 50 mL IVPB  Status:  Discontinued     1 g100 mL/hr over 30 Minutes Intravenous Every 24 hours 01/29/14 0707 01/29/14 0710   01/29/14 0800  vancomycin (VANCOCIN) 500 mg in sodium chloride 0.9 % 100 mL IVPB  Status:  Discontinued     500 mg100 mL/hr over 60 Minutes Intravenous Every 48 hours 01/29/14 0719 01/31/14 1029   01/29/14 0730  piperacillin-tazobactam (ZOSYN) IVPB 2.25 g  Status:  Discontinued     2.25 g100 mL/hr over 30 Minutes Intravenous 4 times per day 01/29/14 0719 01/31/14 1028   01/29/14 0715  azithromycin (ZITHROMAX) 500 mg in dextrose 5 % 250 mL IVPB  Status:  Discontinued     500 mg250 mL/hr over 60 Minutes Intravenous Every 24 hours 01/29/14 0704 01/29/14 0710      Medications: Scheduled Meds: . abacavir  600 mg Per NG tube Daily  . acyclovir  10 mg/kg Intravenous Q24H  . aspirin  81 mg Oral Daily  . clindamycin (CLEOCIN) IV  600 mg Intravenous 3 times per day  . dolutegravir  50 mg Oral BID  . [START ON 02/20/2014] fluconazole  100 mg Per Tube Weekly  . heparin subcutaneous  5,000 Units Subcutaneous 3 times per day  . labetalol  200 mg Oral BID  . lamiVUDine  100 mg Oral  Daily  . levETIRAcetam  500 mg Intravenous Q12H  . [START ON 02/16/2014] predniSONE  40 mg Oral Q breakfast  . primaquine  30 mg Per Tube Q24H   Continuous Infusions:   PRN Meds:.    Objective: Weight change: 1 lb 0.2 oz (0.46 kg)  Intake/Output Summary (Last 24 hours) at 02/15/14 1303 Last data filed at 02/15/14 0600  Gross per 24 hour  Intake    610 ml  Output    900 ml  Net   -290 ml   Blood pressure 140/80, pulse 86, temperature 98.6 F (37 C), temperature source Oral, resp. rate 18, height _0  (1.702 m), weight 112 lb 3.4 oz (50.9 kg), SpO2 96 %. Temp:  [97.2 F (36.2 C)-98.6 F (37 C)] 98.6 F (37 C) (11/09 0504) Pulse Rate:  [79-86] 86 (11/09 0956) Resp:  [18] 18 (11/09 0504) BP: (133-161)/(77-89) 140/80 mmHg (11/09 0956) SpO2:  [96 %-100 %] 96 % (11/09 0504) Weight:  [112 lb 3.4 oz (50.9 kg)] 112 lb 3.4 oz (50.9 kg) (11/09 0556)  Physical Exam: General: aox 3, telling me when she was diagnosed etc HEENT: EOMI CVS tachycardical r,  no murmur rubs or gallops Chest: coarse breath sounds bilaterally Abdomen: softnondistended, normal bowel sounds, Skin: no rashes Neuro: nonfocal  CBC:  CBC Latest Ref Rng 02/15/2014 02/14/2014 02/13/2014  WBC 4.0 - 10.5 K/uL 6.1 5.4 6.3  Hemoglobin 12.0 - 15.0 g/dL 8.3(L) 8.9(L) 9.0(L)  Hematocrit 36.0 - 46.0 % 25.4(L) 27.1(L) 27.0(L)  Platelets 150 - 400 K/uL 269 294 306  BMET  Recent Labs  02/14/14 0605 02/15/14 0525  NA 142 143  K 4.3 4.7  CL 103 103  CO2 23 22  GLUCOSE 90 105*  BUN 113* 119*  CREATININE 3.01* 3.16*  CALCIUM 8.8 8.6     Liver Panel   Recent Labs  02/13/14 0412 02/14/14 0605  ALBUMIN 1.7* 1.7*       Sedimentation Rate No results for input(s): ESRSEDRATE in the last 72 hours. C-Reactive Protein No results for input(s): CRP in the last 72 hours.  Micro Results: Recent Results (from the past 720 hour(s))  Urine culture     Status: None   Collection Time: 01/27/14  3:26 PM    Result Value Ref Range Status   Specimen Description URINE, CATHETERIZED  Final   Special Requests NONE  Final   Culture  Setup Time   Final    01/27/2014 22:22 Performed at [place] Performed at [facility]  Final   Culture NO GROWTH Performed at [facility]  Final   Report Status 01/28/2014 FINAL  Final  Culture, blood (routine x 2)     Status: None   Collection Time: 01/29/14  9:19 AM  Result Value Ref Range Status   Specimen Description BLOOD LEFT ARM  Final   Special Requests BOTTLES DRAWN AEROBIC AND ANAEROBIC 10CC  Final   Culture  Setup Time   Final    01/29/2014 15:04 Performed at [place]   Final    NO GROWTH 5 DAYS Performed at [facility]   Report Status 02/04/2014 FINAL  Final  Culture, blood (routine x 2)     Status: None   Collection Time: 01/29/14  9:30 AM  Result Value Ref Range Status   Specimen Description BLOOD LEFT HAND  Final   Special Requests BOTTLES DRAWN AEROBIC AND ANAEROBIC 5CC  Final   Culture  Setup Time   Final    01/29/2014 15:04 Performed at [place]   Final    NO GROWTH 5 DAYS Performed at [facility]   Report Status 02/04/2014 FINAL  Final  Clostridium Difficile by PCR     Status: None   Collection Time: 01/29/14 11:10 AM  Result Value Ref Range Status   C difficile by pcr NEGATIVE NEGATIVE Final  MRSA PCR Screening     Status: Abnormal   Collection Time: 01/29/14  3:40 PM  Result Value Ref Range Status   MRSA by PCR POSITIVE (A) NEGATIVE Final    Comment:        The GeneXpert MRSA Assay (FDA approved for NASAL specimens only), is one component of a comprehensive MRSA colonization surveillance program. It is not intended to diagnose MRSA infection nor to guide or monitor treatment for MRSA infections. RESULT CALLED TO, READ BACK BY AND VERIFIED WITH: Sherry Ruffing RN 17:50 01/29/14 ([email or web address])  AFB culture, blood      Status: None (Preliminary result)   Collection Time: 01/30/14 11:13 AM  Result Value Ref Range Status   Specimen Description BLOOD LEFT ARM  Final   Special Requests BOTTLES DRAWN AEROBIC ONLY [place]  Final   Culture   Final    CULTURE WILL BE EXAMINED FOR 6 WEEKS BEFORE ISSUING A FINAL REPORT Performed at [facility]   Report Status PENDING  Incomplete  GC/Chlamydia Probe Amp     Status: None   Collection Time: 01/30/14 11:30  AM  Result Value Ref Range Status   CT Probe RNA NEGATIVE NEGATIVE Final   GC Probe RNA NEGATIVE NEGATIVE Final    Comment: (NOTE)                                                                                       **Normal Reference Range: Negative**      Assay performed using the Gen-Probe APTIMA COMBO2 (R) Assay. Acceptable specimen types for this assay include APTIMA Swabs (Unisex, endocervical, urethral, or vaginal), first void urine, and ThinPrep liquid based cytology samples. Performed at [facility]  CSF culture     Status: None   Collection Time: 01/30/14  5:01 PM  Result Value Ref Range Status   Specimen Description CSF  Final   Special Requests NONE  Final   Gram Stain   Final    CYTOSPIN SLIDE WBC PRESENT, PREDOMINANTLY MONONUCLEAR NO ORGANISMS SEEN Performed at [facility] Performed at [place]   Final    NO GROWTH 3 DAYS Performed at [facility]   Report Status 02/03/2014 FINAL  Final  Gram stain     Status: None   Collection Time: 01/30/14  5:01 PM  Result Value Ref Range Status   Specimen Description CSF  Final   Special Requests NONE  Final   Gram Stain   Final    CYTOSPIN SLIDE WBC PRESENT, PREDOMINANTLY MONONUCLEAR NO ORGANISMS SEEN   Report Status 01/30/2014 FINAL  Final  Fungus culture, blood     Status: None   Collection Time: 02/01/14  2:00 PM  Result Value Ref Range Status   Specimen Description BLOOD LEFT HAND  Final   Special Requests BOTTLES DRAWN AEROBIC AND  ANAEROBIC 10CC  Final   Culture   Final    NO GROWTH 7 DAYS Performed at [facility]    Report Status 02/09/2014 FINAL  Final  Culture, blood (routine x 2)     Status: None   Collection Time: 02/02/14 12:07 AM  Result Value Ref Range Status   Specimen Description BLOOD RIGHT HAND  Final   Special Requests BOTTLES DRAWN AEROBIC ONLY [facility]  Final   Culture  Setup Time   Final    02/02/2014 03:55 Performed at [place]   Final    NO GROWTH 5 DAYS Performed at [facility]    Report Status 02/08/2014 FINAL  Final  Culture, blood (routine x 2)     Status: None   Collection Time: 02/02/14 12:18 AM  Result Value Ref Range Status   Specimen Description BLOOD LEFT HAND  Final   Special Requests BOTTLES DRAWN AEROBIC AND ANAEROBIC [facility] EACH  Final   Culture  Setup Time   Final    02/02/2014 03:55 Performed at [place]   Final    NO GROWTH 5 DAYS Performed at [facility]    Report Status 02/08/2014 FINAL  Final  Pneumocystis smear by DFA     Status: None   Collection Time: 02/03/14  2:30 PM  Result Value Ref Range Status  Specimen [place] SPUTUM  Final   Pneumocystis jiroveci Ag POSITIVE  Final    Comment: Performed at [facility] RN 02/04/14 1444 BY J.[email or web address]  Culture, respiratory (NON-Expectorated)     Status: None   Collection Time: 02/04/14  3:50 AM  Result Value Ref Range Status   Specimen Description TRACHEAL ASPIRATE  Final   Special Requests Immunocompromised  Final   Gram Stain   Final    FEW WBC PRESENT,BOTH PMN AND MONONUCLEAR RARE SQUAMOUS EPITHELIAL CELLS PRESENT NO ORGANISMS SEEN Performed at [facility]   Culture   Final    NO GROWTH 2 DAYS Performed at [facility]   Report Status 02/06/2014 FINAL  Final  AFB culture with smear     Status: None (Preliminary result)   Collection Time: 02/04/14  3:50 AM  Result Value Ref Range  Status   Specimen Description TRACHEAL ASPIRATE  Final   Special Requests NONE  Final   Acid Fast Smear   Final    1+ ACID FAST BACILLI SEEN CRITICAL RESULT CALLED TO, READ BACK BY AND VERIFIED WITH: CIARFI 14:55 110/30/15 FUENG CRITICAL RESULT CALLED TO, READ BACK BY AND VERIFIED WITH: LEE ANN STIMPSON H.D. 10:30 02/08/14 FUENG Performed at [facility]    Culture   Final    CULTURE WILL BE EXAMINED FOR 6 WEEKS BEFORE ISSUING A FINAL REPORT Performed at [facility]    Report Status PENDING  Incomplete  Fungus Culture with Smear     Status: None (Preliminary result)   Collection Time: 02/04/14  3:50 AM  Result Value Ref Range Status   Specimen Description TRACHEAL ASPIRATE  Final   Special Requests NONE  Final   Fungal Smear   Final    NO YEAST OR FUNGAL ELEMENTS SEEN Performed at [facility]    Culture   Final    CANDIDA ALBICANS Performed at [facility]    Report Status PENDING  Incomplete  Respiratory virus panel     Status: None   Collection Time: 02/04/14  3:50 AM  Result Value Ref Range Status   Source - RVPAN TRACHEAL ASPIRATE  Final   Respiratory Syncytial Virus A NOT DETECTED  Final   Respiratory Syncytial Virus B NOT DETECTED  Final   Influenza A NOT DETECTED  Final   Influenza B NOT DETECTED  Final   Parainfluenza 1 NOT DETECTED  Final   Parainfluenza 2 NOT DETECTED  Final   Parainfluenza 3 NOT DETECTED  Final   Metapneumovirus NOT DETECTED  Final   Rhinovirus NOT DETECTED  Final   Adenovirus NOT DETECTED  Final   Influenza A H1 NOT DETECTED  Final   Influenza A H3 NOT DETECTED  Final    Comment: (NOTE)       Normal Reference Range for each Analyte: NOT DETECTED Testing performed using the Luminex xTAG Respiratory Viral Panel test kit. The analytical performance characteristics of this assay have been determined by [facility].  The modifications have not been cleared or approved by the FDA. This assay has been  validated pursuant to the CLIA regulations and is used for clinical purposes. Performed at [facility], bal-quantitative     Status: None   Collection Time: 02/04/14 10:31 AM  Result Value Ref Range Status   Specimen Description BRONCHIAL ALVEOLAR LAVAGE  Final   Special Requests Immunocompromised  Final   Gram Stain  Final    FEW WBC PRESENT, PREDOMINANTLY MONONUCLEAR RARE SQUAMOUS EPITHELIAL CELLS PRESENT NO ORGANISMS SEEN Performed at [place] Performed at [facility]  Final   Culture   Final    NO GROWTH 2 DAYS Performed at [facility]   Report Status 02/06/2014 FINAL  Final  M. Tuberculosis complex by PCR     Status: None   Collection Time: 02/06/14  6:14 PM  Result Value Ref Range Status   M. tuberculosis, Direct Not detected Not detected Final    Comment: (NOTE) This test(s) was developed and its performance characteristics  have been determined by [employer],  [place], [place]. Performance characteristics refer to the analytical  performance of the test.    Source (MTBPCR) Sputum  Corrected    Comment: Performed at [place] 11/06 AT 0442: PREVIOUSLY REPORTED AS SPUTUM   AFB culture with smear     Status: None (Preliminary result)   Collection Time: 02/06/14  6:14 PM  Result Value Ref Range Status   Specimen Description SPUTUM  Final   Special Requests NONE  Final   Acid Fast Smear   Final    1+ ACID FAST BACILLI SEEN REFER TO PREVIOUS CULTURE [id number] FOR CALLS Performed at [facility]    Culture   Final    CULTURE WILL BE EXAMINED FOR 6 WEEKS BEFORE ISSUING A FINAL REPORT Performed at [facility]    Report Status PENDING  Incomplete  Culture, blood (routine x 2)     Status: None   Collection Time: 02/07/14  6:26 PM  Result Value Ref Range Status   Specimen Description BLOOD LEFT ARM  Final   Special Requests BOTTLES  DRAWN AEROBIC AND ANAEROBIC 10CC  Final   Culture  Setup Time   Final    02/08/2014 02:25 Performed at [facility]    Culture   Final    NO GROWTH 5 DAYS Performed at [facility]    Report Status 02/15/2014 FINAL  Final  Culture, blood (routine x 2)     Status: None   Collection Time: 02/07/14  6:32 PM  Result Value Ref Range Status   Specimen Description BLOOD BLOOD LEFT FOREARM  Final   Special Requests BOTTLES DRAWN AEROBIC ONLY 10CC  Final   Culture  Setup Time   Final    02/08/2014 02:25 Performed at [facility]    Culture   Final    NO GROWTH 5 DAYS Performed at [facility]    Report Status 02/15/2014 FINAL  Final    Studies/Results: No results found.    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  --continue acyclovir and aim for 21 days total (4 more days)  #2 PCP Pneumonia: continue clindamycin Primaquine and steroids for 21 day course She needs steroid tapered, will go to 43m prednisone and then 21mtomorrow  #4 Renal failure :started ARV, renal failure  likely multifactorial. Cr stabilizing, improving but BUN high  #4 Fevers, diarrhea: empiric M avium drugs started, CMV colitis possible as well. CMV PCR  Blood negative   #5 HIV/AIDS: started her on ARV regimen  Abacavir liquid, Epivir liquid and Tivicay   -SHE NEEDS URGENT ADAP APPROVAL, I HAVE ASKED [facility] [place] [facility] COUNSELORS TO ASSIST  WE NEED PROOF OF INCOME (OR LACK THEREOF, SHE TELLS ME SHE RECEIVES, CHILD SUPPORT BUT THAT IS IT) WE NEED PROOF <300% FEDERAL POVERTY LEVEL AND CAN PUSH ADAP HOPEFULLY TURN AROUND AND HAVE MEDS READY WITHIN 4858OURS IF, IF WE CAN GET EVERYTHING SET UP    #6 afb ON CULTURES FROM LUNGS LIKELY IS mYCOBACTERIUM AVIUM and I am not convinced she actually has disseminated M avium will dc M avium drugs and change to prophylactic dose     LOS: 19 days   CoAlcide Evener1/12/2013, 1:03 PM

## 2014-02-15 NOTE — Clinical Social Work Note (Signed)
Patient transferred from 60M to 5W. Clinical Social Worker has completed handoff for receiving CSW. This CSW is signing off.   Desiree Hale, MSW, LCSWA [phone] 02/15/2014 8:12 AM

## 2014-02-15 NOTE — Progress Notes (Signed)
PROGRESS NOTE  Desiree BeamsKristy Trevathan ZOX:[id number] DOB: [birth year] DOA: 01/27/2014 PCP: No primary care provider on file.  HPI: Patient with a very complicated course, initially admitted with acute encephalopathy on the hospitalist service, then decompensated from a respiratory standpoint and was intubated 10/28. Patient's respiratory status has gradually improved and was able to be extubated on 11/5, and currently she is comfortable on room air. Her course was complicated by acute renal failure consult to be multifactorial due to nephrotoxic agents, ATN. 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 Her acute encephalopathy has been very slow to improve and she has intermittent periods of confusion alternating with alertness. Infectious disease, nephrology, PCCM and as well as palliative care had been involved in her care and are following.  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 Otherwise neg acute.  10/22 EEG >>> normal drowsy and asleep electroencephalogram. No epileptiform activity is noted.  10/22 ECHO >>> EF 55-60%, grade 1 diastolic dysfunction, no RWMA, small free-flowing pericardial effusion 10/28 Worsening tachypnea, AMS, tx ICU. Required intubation  10/30 Renal US >> kidneys diffusely echogenic, concerning for medical renal disease 10/31 Remains on full support, fent gtt, received 1 unit PRBC's  11/01 More awake, anemia improved 11/02 CXR Mild infiltrate right lower lobe cannot be excluded  11/02 ID started ARV therapy . Renal - broad ddx for ATN and unstable for biopsy 02/11/14: cognition and breathing status continue to improve . Extubated 02/14/14 TRH took over  Subjective/ 24 H Interval events - Patient talking about witches.  Seems afraid.  Childlike.  Reports she has 7 children.  She asks if  I am the Chaplain. 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 - patient intubated from 10/28 until 11/5 - IV steroids decreased from 40 mg bid to daily starting today.  Need to taper. - Now extubated, breathing comfortably on room air - last CXR 11/5 clear  HSV encephalitis - continue antibiotics per ID.  Patient still very confused.  Apparently hallucinating.  Elevated troponin level - in the setting of demand ischemia and acute renal failure. Patient denies chest pain.  Acute on chronic diastolic heart failure - On beta blocker.  No sign of volume overload.   Acute on chronic renal failure with metabolic acidosis - Likely ATN from antibioitics with possible ischemia from hypotension. Nephrology was following.  They were concerned for HIV related kidney disease, but unfortunately patient has been too sick to biopsy.  BUN still slowly trending up.  Appreciate nephrology recommendations.  Protein calorie malnutrition - nutrition  consult  Pneumocystis pneumonia - continue antibiotics and steroids.  Patient with out resp distress or wheeze.  Tapering steroids. 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 - on fluconazole weekly  Seizure disorder - stable.  She is on Keppra  Hypertension - stable.  Continue labetalol  Diet: regular Fluids: none  DVT Prophylaxis: heparin  Code Status: Full Family Communication:  Disposition Plan: remain inpatient.  PT consult pending.  Consultants:  ID  Nephrology  PCCM  Palliative  Procedures:  2D echo - normal EF 55-60%, grade 1 diastolic dysfunction  Antibiotics Day 16/21 acyclovir Day 12/21 pneumocystis therapy Day 9 Mycobacterium avium therapy   Studies  CXR 11/5 There is no evidence of pneumonia nor other acute cardiopulmonary abnormality.  Objective  Filed Vitals:   02/14/14 2300 02/15/14 0504 02/15/14 0556 02/15/14 0956  BP: 158/89 133/77  140/80  Pulse: 80 85  86  Temp:  98.6 F (37 C)    TempSrc:  Oral    Resp:  18    Height:      Weight:   50.9 kg (112 lb 3.4 oz)   SpO2:  96%      Intake/Output Summary (Last 24 hours) at 02/15/14 1123 Last data filed at 02/15/14 0600  Gross per 24 hour  Intake    610 ml  Output    900 ml  Net   -290 ml   Filed Weights   02/13/14 0500 02/14/14 0434 02/15/14 0556  Weight: 56.6 kg (124 lb 12.5 oz) 50.44 kg (111 lb 3.2 oz) 50.9 kg (112 lb 3.4 oz)    Exam:  General:  Wd, AA female, lying in bed.  Appears slightly afraid.  No acute distress.  Comfortable.  Cardiovascular: RRR, no frank m/r/g, no lower extremity edema  Respiratory: CTA biL on anterior auscultation, no accessory muscle use.  Abdomen: soft, nontender, nd, +bs, no masses  MSK: no peripheral edema, able to move all 4 extremities.  Psych: patient reports she has been visited by witches,  fearful of anyone who steps into the room.  Data Reviewed: Basic Metabolic Panel:  Recent Labs Lab 02/10/14 0700 02/11/14 0400 02/12/14 0500 02/13/14 0412 02/13/14 0415 02/14/14 0605 02/15/14 0525  NA  --  145 145 138  --  142 143  K  --  5.0 5.3 4.5  --  4.3 4.7  CL  --  104 103 99  --  103 103  CO2  --  23 26 25   --  23 22  GLUCOSE  --  156* 163* 107*  --  90 105*  BUN  --  95* 99* 105*  --  113* 119*  CREATININE  --  3.31* 3.20* 2.94*  --  3.01* 3.16*  CALCIUM  --  8.7 8.8 8.5  --  8.8 8.6  MG  --   --   --   --  2.1 2.0 2.0  PHOS 7.9* 7.4* 7.7* 6.6*  --  7.8*  --    Liver Function Tests:  Recent Labs Lab 02/09/14 0400 02/11/14 0400 02/12/14 0500 02/13/14 0412 02/14/14 0605  ALBUMIN 1.6* 2.0* 1.9* 1.7* 1.7*   CBC:  Recent Labs Lab 02/11/14 0400 02/12/14 0410 02/13/14 0415  02/14/14 0605 02/15/14 0525  WBC 10.8* 8.0 6.3 5.4 6.1  HGB 9.9* 8.5* 9.0* 8.9* 8.3*  HCT 30.1* 26.3* 27.0* 27.1* 25.4*  MCV 78.8 78.0 76.9* 78.8 78.9  PLT 405* 323 306 294 269   CBG:  Recent Labs Lab 02/14/14 1808 02/14/14 2037 02/15/14 0046 02/15/14 0500 02/15/14 0852  GLUCAP 121* 109* 106* 112* 94    Recent Results (from the past 240 hour(s))  M. Tuberculosis complex by PCR     Status: None   Collection Time: 02/06/14  6:14 PM  Result Value Ref Range Status   M. tuberculosis, Direct Not detected Not detected Final    Comment: (NOTE) This test(s) was developed and its performance characteristics  have been determined by [employer],  [place], [place]. Performance characteristics refer to the analytical  performance of the test.    Source (MTBPCR) Sputum  Corrected    Comment: Performed at [facility] CORRECTED ON 11/06 AT 0442: PREVIOUSLY REPORTED AS SPUTUM   AFB culture with smear     Status: None (Preliminary result)   Collection Time: 02/06/14  6:14 PM  Result Value Ref Range Status   Specimen Description SPUTUM  Final   Special  Requests NONE  Final   Acid Fast Smear   Final    1+ ACID FAST BACILLI SEEN REFER TO PREVIOUS CULTURE [id number] FOR CALLS Performed at [facility]    Culture   Final    CULTURE WILL BE EXAMINED FOR 6 WEEKS BEFORE ISSUING A FINAL REPORT Performed at [facility]    Report Status PENDING  Incomplete  Culture, blood (routine x 2)     Status: None   Collection Time: 02/07/14  6:26 PM  Result Value Ref Range Status   Specimen Description BLOOD LEFT ARM  Final   Special Requests BOTTLES DRAWN AEROBIC AND ANAEROBIC 10CC  Final   Culture  Setup Time   Final    02/08/2014 02:25 Performed at [facility]    Culture   Final    NO GROWTH 5 DAYS Performed at [facility]    Report Status 02/15/2014 FINAL  Final  Culture, blood (routine x 2)     Status: None   Collection Time: 02/07/14  6:32 PM  Result Value Ref Range Status   Specimen Description BLOOD BLOOD LEFT FOREARM  Final   Special Requests BOTTLES DRAWN AEROBIC ONLY 10CC  Final   Culture  Setup Time   Final    02/08/2014 02:25 Performed at [facility]    Culture   Final    NO GROWTH 5 DAYS Performed at [facility]    Report Status 02/15/2014 FINAL  Final     Scheduled Meds: . abacavir  600 mg Per NG tube Daily  . acyclovir  10 mg/kg Intravenous Q24H  . aspirin  81 mg Oral Daily  . azithromycin  500 mg Oral Daily  . clindamycin (CLEOCIN) IV  600 mg Intravenous 3 times per day  . dolutegravir  50 mg Oral BID  . ethambutol  800 mg Per Tube Q24H  . [START ON 02/20/2014] fluconazole  100 mg Per Tube Weekly  . heparin subcutaneous  5,000 Units Subcutaneous 3 times per day  . labetalol  200 mg Oral BID  . lamiVUDine  100 mg Oral Daily  . levETIRAcetam  500 mg Intravenous Q12H  . methylPREDNISolone (SOLU-MEDROL) injection  40 mg Intravenous Q12H  . pantoprazole  40 mg Oral Daily  .  primaquine  30 mg Per Tube Q24H  . rifabutin  150 mg Oral Daily   Continuous Infusions:    Time spent: 45 minutes  Algis DownsMarianne York, New JerseyPA-C [facility] Pager [phone]. If 7 PM - 7 AM, please contact night-coverage at [email or web address], password [email or web address] 02/15/2014, 11:23 AM  LOS: 19 days

## 2014-02-15 NOTE — Progress Notes (Signed)
PT Cancellation Note  Patient Details Name: Desiree Hale MRN: [id number] DOB: [birth year]   Cancelled Treatment:    Reason Eval/Treat Not Completed: Patient declined, no reason specified.  Attempted to see patient.  Patient confused.  Yelling at PT to "get out of my room" and "don't touch me".  Will attempt PT at a later date.   Desiree Hale, Desiree Hale H 02/15/2014, 3:13 PM Desiree Hale, PT, [facility] CenterMBA Acute Rehab Services Pager [phone]

## 2014-02-15 NOTE — Progress Notes (Addendum)
Palliative Medicine Team  Multiple family members in the room including her husband and three children. Mrs. Desiree Hale was very tearful and emotional. Overall she is improving. I spoke with her husband outside the room. I introduced myself and our service. I recommended we formalize HCPOA and as Mrs. Desiree Hale improves try to get her to participate in goals of care. Her children do not know about her diagnosis so I deferred the conversation today.  Will follow.  Anderson MaltaElizabeth Golding, DO Palliative Medicine [phone]

## 2014-02-15 NOTE — Progress Notes (Signed)
NUTRITION FOLLOW UP  Intervention:   -Ensure Complete po BID, each supplement provides 350 kcal and 13 grams of protein  Nutrition Dx:   Inadequate oral intake related to inability to eat, acute encephalopathy as evidenced by NPO status, ongoing.  Goal:   Intake to meet >90% of estimated nutrition needs, met.  Monitor:   PO/supplement intake, labs, weight changes, I/O's  Assessment:   43 year old Female with PMH of HTN, marijuana use who presented to [facility] ED 01/27/2014 after she was found unresponsive at home by a family member. CT head showed no acute intracranial findings. Chest x-ray showed no active disease. Patient has received fluid boluses in ED. Patient was admitted for further evaluation and management of altered mental status, dehydration.  Patient with + HIV test, new diagnosis.  Pt was extubated on 02/12/14. Nutrition needs re-estimated due to change in status.  Noted an 11# (8.9%) wt loss in 5 days, likely due to fluid loss. Noted pt had pressure edema when intubated. No intake data currently available. Pt unable to participate in exam or interview due to confusion. She has refused care from multiple healthcare providers.  Noted pt has a a stage II pressure ulcer. Will add supplement due to increased needs for wound healing and multiple medical problems. Palliative care following- to discuss goals of care with family members.  Labs reviewed. BUN/Creat: 113/3.01, Phos: 7.8. Mg and Phos WDL. CBGS: 94-112.   Height: Ht Readings from Last 1 Encounters:  02/09/14 _0  (1.702 m)    Weight Status:   Wt Readings from Last 1 Encounters:  02/15/14 112 lb 3.4 oz (50.9 kg)   02/10/14 123 lb 7.3 oz (56 kg)   Re-estimated needs:  Kcal: 1600-1800 Protein: 66-76 grams Fluid: 1.6-1.8 L  Skin: MASD medial buttocks, stage II pressucre ulcer on sacrum  Diet Order: Diet regular   Intake/Output Summary (Last 24 hours) at 02/15/14 1513 Last data filed at 02/15/14 0600  Gross  per 24 hour  Intake    610 ml  Output    900 ml  Net   -290 ml    Last BM: 02/14/14   Labs:   Recent Labs Lab 02/12/14 0500 02/13/14 0412 02/13/14 0415 02/14/14 0605 02/15/14 0525  NA 145 138  --  142 143  K 5.3 4.5  --  4.3 4.7  CL 103 99  --  103 103  CO2 26 25  --  23 22  BUN 99* 105*  --  113* 119*  CREATININE 3.20* 2.94*  --  3.01* 3.16*  CALCIUM 8.8 8.5  --  8.8 8.6  MG  --   --  2.1 2.0 2.0  PHOS 7.7* 6.6*  --  7.8*  --   GLUCOSE 163* 107*  --  90 105*    CBG (last 3)   Recent Labs  02/15/14 0500 02/15/14 0852 02/15/14 1210  GLUCAP 112* 94 94    Scheduled Meds: . abacavir  600 mg Per NG tube Daily  . acyclovir  10 mg/kg Intravenous Q24H  . aspirin  81 mg Oral Daily  . [START ON 02/17/2014] azithromycin  1,200 mg Oral Weekly  . clindamycin  600 mg Oral 3 times per day  . dolutegravir  50 mg Oral BID  . [START ON 02/20/2014] fluconazole  100 mg Per Tube Weekly  . heparin subcutaneous  5,000 Units Subcutaneous 3 times per day  . labetalol  200 mg Oral BID  . lamiVUDine  100 mg Oral Daily  .  levETIRAcetam  500 mg Intravenous Q12H  . [START ON 02/16/2014] predniSONE  40 mg Oral Q breakfast  . primaquine  30 mg Per Tube Q24H    Continuous Infusions:   Makaelyn Aponte A. Jimmye Norman, RD, LDN Pager: [phone] After hours Pager: [phone]

## 2014-02-16 DIAGNOSIS — N178 Other acute kidney failure: Secondary | ICD-10-CM

## 2014-02-16 LAB — RENAL FUNCTION PANEL
Albumin: 1.8 g/dL — ABNORMAL LOW (ref 3.5–5.2)
Anion gap: 16 — ABNORMAL HIGH (ref 5–15)
BUN: 110 mg/dL — ABNORMAL HIGH (ref 6–23)
CO2: 21 meq/L (ref 19–32)
Calcium: 8.7 mg/dL (ref 8.4–10.5)
Chloride: 105 mEq/L (ref 96–112)
Creatinine, Ser: 3.16 mg/dL — ABNORMAL HIGH (ref 0.50–1.10)
GFR calc non Af Amer: 17 mL/min — ABNORMAL LOW (ref >90)
GFR, EST AFRICAN AMERICAN: 19 mL/min — AB (ref >90)
GLUCOSE: 75 mg/dL (ref 70–99)
PHOSPHORUS: 8.3 mg/dL — AB (ref 2.3–4.6)
Potassium: 4.4 mEq/L (ref 3.7–5.3)
SODIUM: 142 meq/L (ref 137–147)

## 2014-02-16 LAB — QUANTIFERON TB GOLD ASSAY (BLOOD)
Mitogen value: 0.02 IU/mL
Quantiferon Nil Value: 0.02 IU/mL
TB AG VALUE: 0.02 [IU]/mL
TB ANTIGEN MINUS NIL VALUE: 0 [IU]/mL

## 2014-02-16 LAB — CBC
HCT: 25.3 % — ABNORMAL LOW (ref 36.0–46.0)
Hemoglobin: 8.1 g/dL — ABNORMAL LOW (ref 12.0–15.0)
MCH: 26.1 pg (ref 26.0–34.0)
MCHC: 32 g/dL (ref 30.0–36.0)
MCV: 81.6 fL (ref 78.0–100.0)
Platelets: 215 10*3/uL (ref 150–400)
RBC: 3.1 MIL/uL — ABNORMAL LOW (ref 3.87–5.11)
RDW: 16.9 % — ABNORMAL HIGH (ref 11.5–15.5)
WBC: 6 10*3/uL (ref 4.0–10.5)

## 2014-02-16 LAB — GLUCOSE, CAPILLARY
GLUCOSE-CAPILLARY: 99 mg/dL (ref 70–99)
Glucose-Capillary: 108 mg/dL — ABNORMAL HIGH (ref 70–99)
Glucose-Capillary: 111 mg/dL — ABNORMAL HIGH (ref 70–99)
Glucose-Capillary: 84 mg/dL (ref 70–99)
Glucose-Capillary: 84 mg/dL (ref 70–99)
Glucose-Capillary: 94 mg/dL (ref 70–99)

## 2014-02-16 LAB — M. TUBERCULOSIS COMPLEX BY PCR: M. tuberculosis, Direct: NOT DETECTED

## 2014-02-16 NOTE — Progress Notes (Signed)
PROGRESS NOTE  Beryle BeamsKristy Pollak WGN:[id number]:[id number] DOB: 07/16/1970 DOA: 01/27/2014 PCP: No primary care provider on file.  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 She was initially admitted with acute encephalopathy on the hospitalist service, then decompensated from a respiratory standpoint and was intubated 10/28. Patient's respiratory status has gradually improved and was able to be extubated on 11/5, and currently she is comfortable on room air. Her course was complicated by acute renal failure thought to be multifactorial due to nephrotoxic agents, ATN.  Her acute encephalopathy has been very slow to improve and she has intermittent periods of confusion alternating with alertness. On 11/10 her cognitive ability is significantly improved.   Infectious disease, nephrology, PCCM and as well as palliative care had been involved in her care and are following.  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 Otherwise neg acute.  10/22 EEG >>> normal drowsy and asleep electroencephalogram. No epileptiform activity is noted.  10/22 ECHO >>> EF 55-60%, grade 1 diastolic dysfunction, no RWMA, small free-flowing pericardial effusion 10/28 Worsening tachypnea, AMS, tx ICU. Required intubation  10/30 Renal US >> kidneys diffusely echogenic, concerning for medical renal disease 10/31 Remains on full support, fent gtt, received 1 unit PRBC's  11/01 More awake, anemia improved 11/02 CXR Mild infiltrate right lower lobe cannot be excluded  11/02 ID started ARV therapy . Renal - broad ddx for ATN and unstable for biopsy 02/11/14: cognition and breathing status continue to improve . Extubated 02/14/14 TRH took over  Subjective/ 24 H Interval events Patient much more coherent. Making jokes.  Giving appropriate answers to questions.   No complaints.  Asks if she is going to receive physical therapy.  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 - patient intubated from 10/28 until 11/5.  Now breathing comfortably on room air. - discontinuing IV solumedrol - started prednisone 40 mg daily on 11/10 will taper. - last CXR 11/5 clear  Pneumocystis pneumonia - continue antibiotics and steroids.  Patient with out resp distress or wheeze.  Tapering steroids. - she is on clindamycin, primaquine  HSV encephalitis - continue antibiotics per ID.  She needs 3 more days of IV acyclovir.  Mental status much improved 11/10.  Elevated troponin level - in the setting of demand ischemia and acute renal failure. Patient denies chest pain.  Acute on chronic diastolic heart failure - On beta blocker.  No sign of volume overload.   Acute on chronic renal failure with metabolic acidosis - Likely ATN from antibioitics with possible ischemia from hypotension. Nephrology was following.  They were  concerned for HIV related kidney disease, but unfortunately patient has been too sick to biopsy.  BUN/CR is stable.  Nephrology has followed him during this hospitalization and has  signed off.  Protein calorie malnutrition - nutrition consulted.  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 - on fluconazole weekly -ID working to gain urgent ADAP approval.  Need to gain this prior to discharge so that the patient can leave with medication in hand.  Seizure disorder - stable.  She is on Keppra  Hypertension - stable.  Continue labetalol  Diet: regular Fluids: none  DVT Prophylaxis: heparin  Code Status: Full Family Communication:  Disposition Plan: remain inpatient.  PT consult pending.  Consultants:  ID  Nephrology  PCCM  Palliative  Procedures:  2D echo - normal EF 55-60%, grade 1 diastolic dysfunction  Antibiotics Anti-infectives    Start     Dose/Rate Route Frequency Ordered Stop   02/20/14 1000  fluconazole (DIFLUCAN) 40 MG/ML suspension 100 mg     100 mg Per Tube Weekly 02/13/14 1213     02/17/14 0000  azithromycin (ZITHROMAX) tablet 1,200 mg     1,200 mg Oral Weekly 02/15/14 1309     02/15/14 1400  clindamycin (CLEOCIN) capsule 600 mg     600 mg Oral 3 times per day 02/15/14 1311     02/13/14 1300  azithromycin (ZITHROMAX) tablet 500 mg  Status:  Discontinued     500 mg Oral Daily 02/13/14 1206 02/15/14 1240   02/09/14 1000  lamiVUDine (EPIVIR) 10 MG/ML solution 100 mg     100 mg Oral Daily 02/08/14 1149     02/08/14 1600  azithromycin (ZITHROMAX) tablet 1,200 mg  Status:  Discontinued     1,200 mg Per Tube Weekly 02/03/14 0955 02/03/14 1012   02/08/14 1300  abacavir (ZIAGEN) tablet 600 mg     600 mg Per NG tube Daily 02/08/14 1115     02/08/14 1200  lamiVUDine (EPIVIR) 10 MG/ML solution 150 mg     150 mg Per Tube  Once 02/08/14 1115 02/08/14 1320   02/08/14 1200  dolutegravir (TIVICAY) tablet 50 mg      Comments:  Per NG TUBE   50 mg Oral 2 times daily 02/08/14 1115     02/08/14 1000  azithromycin (ZITHROMAX) 200 MG/5ML suspension 1,200 mg  Status:  Discontinued     1,200 mg Oral Weekly 02/03/14 1013 02/03/14 1015   02/08/14 1000  azithromycin (ZITHROMAX) 200 MG/5ML suspension 1,200 mg  Status:  Discontinued     1,200 mg Per Tube Weekly 02/03/14 1015 02/05/14 1542   02/06/14 1200  acyclovir (ZOVIRAX) 470 mg in dextrose 5 % 100 mL IVPB     10 mg/kg  47 kg109.4 mL/hr over 60 Minutes Intravenous Every 24 hours 02/06/14 1110     02/05/14 2200  clindamycin (CLEOCIN) IVPB 600 mg  Status:  Discontinued     600 mg100 mL/hr over 30 Minutes Intravenous 3 times per day 02/05/14 1542 02/15/14 1311   02/05/14 2200  primaquine tablet 30 mg     30 mg Per Tube Every 24 hours 02/05/14 1542     02/05/14 1700  azithromycin (ZITHROMAX) 200 MG/5ML suspension 600 mg  Status:  Discontinued     600 mg Per Tube Daily 02/05/14  1542 02/13/14 1206   02/05/14 1700  ethambutol (MYAMBUTOL) tablet 800 mg  Status:  Discontinued     800 mg Oral Every 24 hours 02/05/14 1542 02/05/14 1545   02/05/14 1700  ethambutol (MYAMBUTOL) tablet 800 mg  Status:  Discontinued     800 mg Per Tube Every 24 hours 02/05/14 1545 02/15/14 1240   02/05/14 1600  rifabutin (MYCOBUTIN) capsule 150 mg  Status:  Discontinued     150 mg Oral Daily 02/05/14 1542 02/15/14 1240   02/05/14 1200  acyclovir (ZOVIRAX) 450 mg in dextrose 5 % 100 mL IVPB  Status:  Discontinued     10 mg/kg  45 kg109 mL/hr over 60 Minutes Intravenous Every 24 hours 02/04/14 1554 02/04/14 1601   02/05/14 1200  acyclovir (ZOVIRAX) 400 mg in dextrose 5 % 100 mL IVPB  Status:  Discontinued     10 mg/kg  40 kg (Order-Specific)108 mL/hr over 60 Minutes Intravenous Every 24 hours 02/04/14 1601 02/06/14 1110   02/05/14 1200  sulfamethoxazole-trimethoprim (BACTRIM) 240 mg of trimethoprim in dextrose 5 % 250 mL IVPB  Status:  Discontinued     240 mg of trimethoprim265 mL/hr over 60  Minutes Intravenous Every 12 hours 02/05/14 1156 02/05/14 1510   02/05/14 1000  fluconazole (DIFLUCAN) 40 MG/ML suspension 100 mg  Status:  Discontinued     100 mg Per Tube Daily 02/04/14 1759 02/13/14 1213   02/04/14 2000  piperacillin-tazobactam (ZOSYN) IVPB 2.25 g     2.25 g100 mL/hr over 30 Minutes Intravenous Every 8 hours 02/04/14 1613 02/04/14 2121   02/04/14 1100  ganciclovir (CYTOVENE) 55 mg in sodium chloride 0.9 % 100 mL IVPB  Status:  Discontinued     1.25 mg/kg  45 kg100 mL/hr over 60 Minutes Intravenous Every 24 hours 02/04/14 1007 02/04/14 1450   02/04/14 1000  fluconazole (DIFLUCAN) tablet 400 mg  Status:  Discontinued     400 mg Per Tube Daily 02/03/14 0955 02/03/14 1015   02/04/14 1000  fluconazole (DIFLUCAN) 40 MG/ML suspension 400 mg  Status:  Discontinued     400 mg Per Tube Daily 02/03/14 1015 02/03/14 1136   02/04/14 1000  fluconazole (DIFLUCAN) 40 MG/ML suspension 200 mg  Status:  Discontinued     200 mg Per Tube Daily 02/03/14 1136 02/04/14 1759   02/04/14 1000  acyclovir (ZOVIRAX) 400 mg in dextrose 5 % 100 mL IVPB  Status:  Discontinued     10 mg/kg  40 kg108 mL/hr over 60 Minutes Intravenous Every 24 hours 02/03/14 1412 02/04/14 0952   02/04/14 0000  sulfamethoxazole-trimethoprim (BACTRIM) 200 mg of trimethoprim in dextrose 5 % 250 mL IVPB  Status:  Discontinued     5 mg/kg of trimethoprim  40 kg262.5 mL/hr over 60 Minutes Intravenous Every 12 hours 02/03/14 1412 02/05/14 1156   02/03/14 2000  piperacillin-tazobactam (ZOSYN) IVPB 2.25 g  Status:  Discontinued     2.25 g100 mL/hr over 30 Minutes Intravenous Every 8 hours 02/03/14 1412 02/04/14 1613   02/02/14 1400  sulfamethoxazole-trimethoprim (BACTRIM) 200 mg in dextrose 5 % 250 mL IVPB  Status:  Discontinued     15 mg/kg/day  40 kg262.5 mL/hr over 60 Minutes Intravenous 3 times per day 02/02/14 1354 02/03/14 1412   02/02/14 1300  fluconazole (DIFLUCAN) tablet 400 mg  Status:  Discontinued     400 mg Oral Daily  02/02/14 1026 02/03/14 0955   02/01/14 1700  sulfamethoxazole-trimethoprim (BACTRIM DS) 800-160 MG per tablet 1 tablet  Status:  Discontinued     1 tablet Oral Once per day on Mon Wed Fri 02/01/14 1549 02/02/14 1316   02/01/14 1600  azithromycin (ZITHROMAX) tablet 1,200 mg  Status:  Discontinued     1,200 mg Oral Weekly 02/01/14 1515 02/03/14 0955   02/01/14 1000  acyclovir (ZOVIRAX) 420 mg in dextrose 5 % 100 mL IVPB  Status:  Discontinued     10 mg/kg  41.9 kg108.4 mL/hr over 60 Minutes Intravenous Every 12 hours 02/01/14 0909 02/03/14 1412   02/01/14 1000  vancomycin (VANCOCIN) IVPB 750 mg/150 ml premix  Status:  Discontinued     750 mg150 mL/hr over 60 Minutes Intravenous Every 24 hours 02/01/14 0910 02/03/14 0910   02/01/14 0823  vancomycin (VANCOCIN) 500 mg in sodium chloride 0.9 % 100 mL IVPB  Status:  Discontinued     500 mg100 mL/hr over 60 Minutes Intravenous Every 24 hours 01/31/14 1029 02/01/14 0909   01/31/14 1200  piperacillin-tazobactam (ZOSYN) IVPB 3.375 g  Status:  Discontinued     3.375 g12.5 mL/hr over 240 Minutes Intravenous Every 8 hours 01/31/14 1029 02/03/14 1412   01/30/14 2100  acyclovir (ZOVIRAX) 390 mg in dextrose 5 % 100 mL IVPB  Status:  Discontinued     10 mg/kg  39.1 kg107.8 mL/hr over 60 Minutes Intravenous Every 24 hours 01/30/14 2018 02/01/14 0908   01/29/14 1600  acyclovir (ZOVIRAX) 420 mg in dextrose 5 % 100 mL IVPB  Status:  Discontinued     420 mg108.4 mL/hr over 60 Minutes Intravenous Every 24 hours 01/29/14 1457 01/29/14 2015   01/29/14 0800  cefTRIAXone (ROCEPHIN) 1 g in dextrose 5 % 50 mL IVPB  Status:  Discontinued     1 g100 mL/hr over 30 Minutes Intravenous Every 24 hours 01/29/14 0707 01/29/14 0710   01/29/14 0800  vancomycin (VANCOCIN) 500 mg in sodium chloride 0.9 % 100 mL IVPB  Status:  Discontinued     500 mg100 mL/hr over 60 Minutes Intravenous Every 48 hours 01/29/14 0719 01/31/14 1029   01/29/14 0730  piperacillin-tazobactam (ZOSYN) IVPB  2.25 g  Status:  Discontinued     2.25 g100 mL/hr over 30 Minutes Intravenous 4 times per day 01/29/14 0719 01/31/14 1028   01/29/14 0715  azithromycin (ZITHROMAX) 500 mg in dextrose 5 % 250 mL IVPB  Status:  Discontinued     500 mg250 mL/hr over 60 Minutes Intravenous Every 24 hours 01/29/14 0704 01/29/14 0710        Studies  CXR 11/5 There is no evidence of pneumonia nor other acute cardiopulmonary abnormality.  Objective  Filed Vitals:   02/16/14 0500 02/16/14 0551 02/16/14 0635 02/16/14 1011  BP:  172/94 152/76 150/72  Pulse:  69  72  Temp:  97.4 F (36.3 C)    TempSrc:  Oral    Resp:  18    Height:      Weight: 58.5 kg (128 lb 15.5 oz)     SpO2:  96%      Intake/Output Summary (Last 24 hours) at 02/16/14 1248 Last data filed at 02/16/14 1000  Gross per 24 hour  Intake   1320 ml  Output   1200 ml  Net    120 ml   Filed Weights   02/14/14 0434 02/15/14 0556 02/16/14 0500  Weight: 50.44 kg (111 lb 3.2 oz) 50.9 kg (112 lb 3.4 oz) 58.5 kg (128 lb 15.5 oz)    Exam:  General:  Wd, AA female, lying in bed. NAD.  Comfortable.  Cardiovascular: RRR, no frank m/r/g, no lower extremity edema  Respiratory: CTA biL on anterior auscultation, no accessory muscle use.  Abdomen: soft, nontender, nd, +bs, no masses  MSK: no peripheral edema, able to move all 4 extremities.  Psych: Appropriate, cooperative.  Data Reviewed: Basic Metabolic Panel:  Recent Labs Lab 02/11/14 0400 02/12/14 0500 02/13/14 0412 02/13/14 0415 02/14/14 0605 02/15/14 0525 02/16/14 0706  NA 145 145 138  --  142 143 142  K 5.0 5.3 4.5  --  4.3 4.7 4.4  CL 104 103 99  --  103 103 105  CO2 23 26 25   --  23 22 21   GLUCOSE 156* 163* 107*  --  90 105* 75  BUN 95* 99* 105*  --  113* 119* 110*  CREATININE 3.31* 3.20* 2.94*  --  3.01* 3.16* 3.16*  CALCIUM 8.7 8.8 8.5  --  8.8 8.6 8.7  MG  --   --   --  2.1 2.0 2.0  --   PHOS 7.4* 7.7* 6.6*  --  7.8*  --  8.3*   Liver Function Tests:  Recent  Labs Lab 02/11/14 0400 02/12/14 0500 02/13/14 0412 02/14/14 0605 02/16/14 0706  ALBUMIN 2.0* 1.9* 1.7* 1.7* 1.8*   CBC:  Recent Labs Lab 02/12/14 0410 02/13/14 0415 02/14/14 0605 02/15/14 0525 02/16/14 0706  WBC 8.0 6.3 5.4 6.1 6.0  HGB 8.5* 9.0* 8.9* 8.3* 8.1*  HCT 26.3* 27.0* 27.1* 25.4* 25.3*  MCV 78.0 76.9* 78.8 78.9 81.6  PLT 323 306 294 269 215   CBG:  Recent Labs Lab 02/15/14 2035 02/16/14 0023 02/16/14 0417 02/16/14 0757 02/16/14 1213  GLUCAP 112* 108* 84 84 99    Recent Results (from the past 240 hour(s))  M. Tuberculosis complex by PCR     Status: None   Collection Time: 02/06/14  6:14 PM  Result Value Ref Range Status   M. tuberculosis, Direct Not detected Not detected Final    Comment: (NOTE) This test(s) was developed and its performance characteristics  have been determined by [facility],  [place], [place]. Performance characteristics refer to the analytical  performance of the test.    Source (MTBPCR) Sputum  Corrected    Comment: Performed at [facility] CORRECTED ON 11/06 AT 0442: PREVIOUSLY REPORTED AS SPUTUM   AFB culture with smear     Status: None (Preliminary result)   Collection Time: 02/06/14  6:14 PM  Result Value Ref Range Status   Specimen Description SPUTUM  Final   Special Requests NONE  Final   Acid Fast Smear   Final    1+ ACID FAST BACILLI SEEN REFER TO PREVIOUS CULTURE [id number] FOR CALLS Performed at [facility]    Culture   Final    CULTURE WILL BE EXAMINED FOR 6 WEEKS BEFORE ISSUING A FINAL REPORT Performed at [facility]    Report Status PENDING  Incomplete  Culture, blood (routine x 2)     Status: None   Collection Time: 02/07/14  6:26 PM  Result Value Ref Range Status   Specimen Description BLOOD LEFT ARM  Final   Special Requests BOTTLES DRAWN AEROBIC AND ANAEROBIC 10CC  Final   Culture  Setup Time   Final    02/08/2014 02:25 Performed at [facility]    Culture   Final    NO GROWTH 5 DAYS Performed at [facility]    Report Status 02/15/2014 FINAL  Final  Culture,  blood (routine x 2)     Status: None   Collection Time: 02/07/14  6:32 PM  Result Value Ref Range Status   Specimen Description BLOOD BLOOD LEFT FOREARM  Final   Special Requests BOTTLES DRAWN AEROBIC ONLY 10CC  Final   Culture  Setup Time   Final    02/08/2014 02:25 Performed at [facility]    Culture   Final    NO GROWTH 5 DAYS Performed at [facility]    Report Status 02/15/2014 FINAL  Final     Scheduled Meds: . abacavir  600 mg Per NG tube Daily  . acyclovir  10 mg/kg Intravenous Q24H  . aspirin  81 mg Oral Daily  . [START ON 02/17/2014] azithromycin  1,200 mg Oral Weekly  . clindamycin  600 mg Oral 3 times per day  . dolutegravir  50 mg Oral BID  . feeding supplement (ENSURE COMPLETE)  237 mL Oral BID BM  . [START ON 02/20/2014] fluconazole  100 mg Per Tube Weekly  . heparin subcutaneous  5,000 Units Subcutaneous 3 times per day  . labetalol  200 mg Oral BID  . lamiVUDine  100 mg Oral Daily  . levETIRAcetam  500 mg Intravenous Q12H  . pantoprazole  40 mg Oral Daily  . predniSONE  40 mg Oral Q breakfast  . primaquine  30 mg Per Tube Q24H   Continuous Infusions:   Time spent: 45 minutes  Algis Downs, [place] [facility] Pager [phone]. If 7 PM - 7 AM, please contact night-coverage at [email or web address], password [facility] 02/16/2014, 12:48 PM  LOS: 20 days

## 2014-02-16 NOTE — Progress Notes (Signed)
Evening b/p is high- patient given scheduled b/p medication. Will recheck

## 2014-02-16 NOTE — Clinical Social Work Note (Signed)
CSW continues to follow for SNF placement if necessary. CSW will meet with patient tomorrow discuss current available options for patient.    Roddie McBryant Jermel Artley MSW, [place], [place], [phone]

## 2014-02-16 NOTE — Evaluation (Signed)
Physical Therapy Evaluation Patient Details Name: Desiree BeamsKristy Sublett MRN: [id number] DOB: 1970-11-28 Today's Date: 02/16/2014   History of Present Illness  43 year old female with past medical history of hypertension, pancreatic cancer (per ED resident note, but this is not noted in H&P), marijuana use who [facility] after she was found unresponsive at home by a family member. Patient was found shaking, lying on the floor, unable to speak.    Clinical Impression  Pt currently with functional limitations due to decreased strength, balance and mobility. Pt will benefit from skilled PT to increase independence and safety with mobility to allow discharge to SNF. Pt exhibited strength deficits, particularly with trunk and Left LE. Pt required max assist to sit EOB and remain sitting EOB. Pt  had difficulty engaging core muscles when asked to move outside her center of gravity and required assistance righting herself.      Follow Up Recommendations SNF    Equipment Recommendations  Other (comment) (TBD)    Recommendations for Other Services       Precautions / Restrictions Precautions Precautions: Fall Restrictions Weight Bearing Restrictions: No      Mobility  Bed Mobility Overal bed mobility: Needs Assistance Bed Mobility: Supine to Sit;Sit to Supine     Supine to sit: Max assist Sit to supine: Max assist   General bed mobility comments: Pt able to initiate components of movement with cuing such as pushing up from elbow to sitting and sliding her Right leg over to hang off bed. Pt had poor trunk control and required assistance lifting trunk into sitting.   Transfers                    Ambulation/Gait                Stairs            Wheelchair Mobility    Modified Rankin (Stroke Patients Only)       Balance Overall balance assessment: Needs assistance Sitting-balance support: Feet supported;No upper extremity supported Sitting  balance-Leahy Scale: Zero Sitting balance - Comments: Pt required max assist to remain sitting EOB. Pt able to reach down and touch knees with PT holding her. Pt had difficulty righting herself when purturbed by PT and had limited core muscle stability. Pt had tendency to posterior lean when trying to perform LAQ on Left side. Pt sat on EOB with assistance for 6 minutes.                                      Pertinent Vitals/Pain Pain Assessment: 0-10 Pain Score: 8  Pain Location: Legs Pain Intervention(s): Repositioned    Home Living Family/patient expects to be discharged to:: Skilled nursing facility Living Arrangements: Other relatives   Type of Home: Other(Comment) (Condo) Home Access: Stairs to enter Entrance Stairs-Rails: Left Entrance Stairs-Number of Steps: 7 Home Layout: One level Home Equipment: None Additional Comments: Pt stated that she lives with daughter.     Prior Function Level of Independence: Independent               Hand Dominance        Extremity/Trunk Assessment               Lower Extremity Assessment: LLE deficits/detail   LLE Deficits / Details: Pt able to actively plantarflex L foot but unable to dorsiflex. Pt able to initate  knee and hip flexion with palpable muscle contraction but unable to move through partial ROM without assistance.      Communication   Communication: No difficulties  Cognition Arousal/Alertness: Awake/alert Behavior During Therapy: WFL for tasks assessed/performed Overall Cognitive Status: Difficult to assess                      General Comments General comments (skin integrity, edema, etc.): Pt requested lotion for her hands which she felt were dry and wrinkled. PT gave her a quarter-sized dollop of lotion which she had in her room.     Exercises  Pt attempted pumping ankles and LAQ in sitting at EOB.       Assessment/Plan    PT Assessment Patient needs continued PT services   PT Diagnosis Generalized weakness   PT Problem List Decreased strength;Decreased activity tolerance;Decreased balance;Decreased mobility  PT Treatment Interventions DME instruction;Gait training;Functional mobility training;Therapeutic activities;Therapeutic exercise;Balance training;Patient/family education;Stair training   PT Goals (Current goals can be found in the Care Plan section) Acute Rehab PT Goals Patient Stated Goal: Return home PT Goal Formulation: With patient Time For Goal Achievement: 03/02/14 Potential to Achieve Goals: Fair    Frequency Min 3X/week   Barriers to discharge        Co-evaluation               End of Session   Activity Tolerance: Patient limited by fatigue;Patient tolerated treatment well Patient left: in bed;with call bell/phone within reach           Time: [id number] PT Time Calculation (min) (ACUTE ONLY): 38 min   Charges:   PT Evaluation $PT Re-evaluation: 1 Procedure PT Treatments $Therapeutic Activity: 8-22 mins $Neuromuscular Re-education: 8-22 mins   PT G Codes:          Atziry Baranski, Eliseo GumKenneth V, SPT 02/16/2014, 4:40 PM   [place] Spaniellivia Hebert, SPT  Acute Rehabilitation [phone]

## 2014-02-16 NOTE — Plan of Care (Signed)
Problem: Phase I Progression Outcomes Goal: Flu/PneumoVaccines if indicated Outcome: Not Met (add Reason) Patient refuses flu and pneumonia vaccinations.

## 2014-02-16 NOTE — Progress Notes (Signed)
Patient ID: Desiree BeamsKristy Nold, female    DOB: [birth year], 43 y.o.   MRN: [id number] Nephrology Progress Note  S: Patient without complaints overnight.  O:BP 152/76 mmHg  Pulse 69  Temp(Src) 97.4 F (36.3 C) (Oral)  Resp 18  Ht 5\' 7"  (1.702 m)  Wt 128 lb 15.5 oz (58.5 kg)  BMI 20.19 kg/m2  SpO2 96%  LMP   Intake/Output Summary (Last 24 hours) at 02/16/14 0900 Last data filed at 02/16/14 0530  Gross per 24 hour  Intake    960 ml  Output   1200 ml  Net   -240 ml   Intake/Output: I/O last 3 completed shifts: In: 1570 [P.O.:1070; IV Piggyback:500] Out: 2100 [Urine:2100]  Intake/Output this shift:    Weight change: 16 lb 12.1 oz (7.6 kg)  Gen: laying in bed, appears malnourished, no distress HEENT: No JVD CVS: Regular rate and rhythm Resp: Clear to auscultation bilaterally Abd: Soft, not tender Ext: 1+ edema in LE   Recent Labs Lab 02/10/14 0433 02/10/14 0700 02/11/14 0400 02/12/14 0500 02/13/14 0412 02/14/14 0605 02/15/14 0525 02/16/14 0706  NA 143  --  145 145 138 142 143 142  K 5.6*  --  5.0 5.3 4.5 4.3 4.7 4.4  CL 107  --  104 103 99 103 103 105  CO2 22  --  23 26 25 23 22 21   GLUCOSE 128*  --  156* 163* 107* 90 105* 75  BUN 91*  --  95* 99* 105* 113* 119* 110*  CREATININE 3.57*  --  3.31* 3.20* 2.94* 3.01* 3.16* 3.16*  ALBUMIN  --   --  2.0* 1.9* 1.7* 1.7*  --  1.8*  CALCIUM 8.7  --  8.7 8.8 8.5 8.8 8.6 8.7  PHOS  --  7.9* 7.4* 7.7* 6.6* 7.8*  --  8.3*   Liver Function Tests:  Recent Labs Lab 02/13/14 0412 02/14/14 0605 02/16/14 0706  ALBUMIN 1.7* 1.7* 1.8*   No results for input(s): LIPASE, AMYLASE in the last 168 hours. No results for input(s): AMMONIA in the last 168 hours. CBC:  Recent Labs Lab 02/12/14 0410 02/13/14 0415 02/14/14 0605 02/15/14 0525 02/16/14 0706  WBC 8.0 6.3 5.4 6.1 6.0  HGB 8.5* 9.0* 8.9* 8.3* 8.1*  HCT 26.3* 27.0* 27.1* 25.4* 25.3*  MCV 78.0 76.9* 78.8 78.9 81.6  PLT 323 306 294 269 215    CBG:  Recent Labs Lab  02/15/14 1637 02/15/14 2035 02/16/14 0023 02/16/14 0417 02/16/14 0757  GLUCAP 136* 112* 108* 84 84   Medications: . abacavir  600 mg Per NG tube Daily  . acyclovir  10 mg/kg Intravenous Q24H  . aspirin  81 mg Oral Daily  . [START ON 02/17/2014] azithromycin  1,200 mg Oral Weekly  . clindamycin  600 mg Oral 3 times per day  . dolutegravir  50 mg Oral BID  . feeding supplement (ENSURE COMPLETE)  237 mL Oral BID BM  . [START ON 02/20/2014] fluconazole  100 mg Per Tube Weekly  . heparin subcutaneous  5,000 Units Subcutaneous 3 times per day  . labetalol  200 mg Oral BID  . lamiVUDine  100 mg Oral Daily  . levETIRAcetam  500 mg Intravenous Q12H  . pantoprazole  40 mg Oral Daily  . predniSONE  40 mg Oral Q breakfast  . primaquine  30 mg Per Tube Q24H  Infusions   Results for Desiree BeamsKING, Desiree (MRN [id number]) as of 02/09/2014 12:21  Ref. 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 She was admitted with an initial creatinine of 3.07 with a transient improvement to 1.45, then progressive worsening,  peaked at 3.78 on 11/3 with slow imp now with some trending up. Exposed to many antiinfectives (some that are nephrotoxic agents) in addition to having newly diagnosed HIV/AIDs that is most likely a contributing factor. Weight on admission of 88lbs improved to 128lbs. Patient encephalopathy appears to be not improving   Assessment/Plan: 1. AKI with metabolic acidosis: Appears to be multifactorial as patient arrived with renal insufficiency  but has also received many nephrotoxic agents . Concern patient may have HIVAN vs HIVICK as a background cause for CKD with possible superimposedAIN vs ATN. Relative hypotension (big swings in systolic  BP) could also have played  a part in patient's worsening kidney function (renal ischemia). Patient has bland U/A at last check.  Renal ultrasound shows echodense kidneys. Urine output remains adequate (1.25L/24hr). ANA and anti-DNA ab wnl. C3 and C4 wnl.  Unstable for renal biopsy, which would be needed to confirm pathology in the future unless full return to normal renal function. Creatinine nadir was 2.94, now back up to 3.16 and appears that it may be at plateau. BUN back over 100 but has hopefully reached plateau as well. Of note, on prednisone currently at 40 mg and also hgb dropping raising the issue of a possible GIB- both these things could lead to worsening azotemia. Will continue protonix 2. Hypertension: patient currently on labetalol. 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 Continues to improve 4. HIV/AIDs: recently diagnosed. Unsure if this is a known diagnosis by patient. HIV therapy started 11/2. Will need to be cautious as to not cause further kidney injury. CMV negative. M. tuberculosis negative. 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 5. PCP pneumonia: management per primary and infectious disease 6. Diabetes mellitus: listed in history, although no documented medications. Per primary 7. Mental status- patient has MANY  possible etiologies of confusion, a BUN over 100 could be one of many causes. The tapering of prednisone should help   Jacquelin Hawkingalph Nettey, MD PGY-2, [facility] 02/16/2014, 9:00 AM   Patient seen and examined, agree with above note with above modifications. No significant change in MS- Kidney function labs stable and is non oliguric.  Hgb still trending down- possible GIB and also azotemia due to steroids.  I still do not feel that there are indications for HD- I agree with plans of once things stabilize to get her to a SNF will be the most likely option.  Nothing new to offer-  Renal is going to follow her labs at a distance. Please call us back if we can be of further assist  Annie SableKellie Christianna Belmonte, MD 02/16/2014

## 2014-02-16 NOTE — Progress Notes (Signed)
  [facility]    Day 18 Acyclovir DAy 13 pcp rx  IV clinda/primaquin )  On weekly prophlyactic azithromycin now and prophylactic fluconazole  10 days emb/rifabutin/azithro  Day 14 fluconazole  Zosyn D/cd after 7 days    Subjective: Alert oriented answering questions   Antibiotics:  Anti-infectives    Start     Dose/Rate Route Frequency Ordered Stop   02/20/14 1000  fluconazole (DIFLUCAN) 40 MG/ML suspension 100 mg     100 mg Per Tube Weekly 02/13/14 1213     02/17/14 0000  azithromycin (ZITHROMAX) tablet 1,200 mg     1,200 mg Oral Weekly 02/15/14 1309     02/15/14 1400  clindamycin (CLEOCIN) capsule 600 mg     600 mg Oral 3 times per day 02/15/14 1311     02/13/14 1300  azithromycin (ZITHROMAX) tablet 500 mg  Status:  Discontinued     500 mg Oral Daily 02/13/14 1206 02/15/14 1240   02/09/14 1000  lamiVUDine (EPIVIR) 10 MG/ML solution 100 mg     100 mg Oral Daily 02/08/14 1149     02/08/14 1600  azithromycin (ZITHROMAX) tablet 1,200 mg  Status:  Discontinued     1,200 mg Per Tube Weekly 02/03/14 0955 02/03/14 1012   02/08/14 1300  abacavir (ZIAGEN) tablet 600 mg     600 mg Per NG tube Daily 02/08/14 1115     02/08/14 1200  lamiVUDine (EPIVIR) 10 MG/ML solution 150 mg     150 mg Per Tube  Once 02/08/14 1115 02/08/14 1320   02/08/14 1200  dolutegravir (TIVICAY) tablet 50 mg    Comments:  Per NG TUBE   50 mg Oral 2 times daily 02/08/14 1115     02/08/14 1000  azithromycin (ZITHROMAX) 200 MG/5ML suspension 1,200 mg  Status:  Discontinued     1,200 mg Oral Weekly 02/03/14 1013 02/03/14 1015   02/08/14 1000  azithromycin (ZITHROMAX) 200 MG/5ML suspension 1,200 mg  Status:  Discontinued     1,200 mg Per Tube Weekly 02/03/14 1015 02/05/14 1542   02/06/14 1200  acyclovir (ZOVIRAX) 470 mg in dextrose 5 % 100 mL IVPB     10 mg/kg  47  kg109.4 mL/hr over 60 Minutes Intravenous Every 24 hours 02/06/14 1110     02/05/14 2200  clindamycin (CLEOCIN) IVPB 600 mg  Status:  Discontinued     600 mg100 mL/hr over 30 Minutes Intravenous 3 times per day 02/05/14 1542 02/15/14 1311   02/05/14 2200  primaquine tablet 30 mg     30 mg Per Tube Every 24 hours 02/05/14 1542     02/05/14 1700  azithromycin (ZITHROMAX) 200 MG/5ML suspension 600 mg  Status:  Discontinued     600 mg Per Tube Daily 02/05/14 1542 02/13/14 1206   02/05/14 1700  ethambutol (MYAMBUTOL) tablet 800 mg  Status:  Discontinued     800 mg Oral Every 24 hours 02/05/14 1542 02/05/14 1545   02/05/14 1700  ethambutol (MYAMBUTOL) tablet 800 mg  Status:  Discontinued     800 mg Per Tube Every 24 hours 02/05/14 1545 02/15/14 1240   02/05/14 1600  rifabutin (MYCOBUTIN) capsule 150 mg  Status:  Discontinued     150 mg Oral Daily 02/05/14 1542 02/15/14 1240   02/05/14 1200  acyclovir (ZOVIRAX) 450 mg in dextrose 5 % 100 mL IVPB  Status:  Discontinued     10 mg/kg  45 kg109 mL/hr over 60 Minutes Intravenous Every 24 hours   02/04/14 1554 02/04/14 1601   02/05/14 1200  acyclovir (ZOVIRAX) 400 mg in dextrose 5 % 100 mL IVPB  Status:  Discontinued     10 mg/kg  40 kg (Order-Specific)108 mL/hr over 60 Minutes Intravenous Every 24 hours 02/04/14 1601 02/06/14 1110   02/05/14 1200  sulfamethoxazole-trimethoprim (BACTRIM) 240 mg of trimethoprim in dextrose 5 % 250 mL IVPB  Status:  Discontinued     240 mg of trimethoprim265 mL/hr over 60 Minutes Intravenous Every 12 hours 02/05/14 1156 02/05/14 1510   02/05/14 1000  fluconazole (DIFLUCAN) 40 MG/ML suspension 100 mg  Status:  Discontinued     100 mg Per Tube Daily 02/04/14 1759 02/13/14 1213   02/04/14 2000  piperacillin-tazobactam (ZOSYN) IVPB 2.25 g     2.25 g100 mL/hr over 30 Minutes Intravenous Every 8 hours 02/04/14 1613 02/04/14 2121   02/04/14 1100  ganciclovir (CYTOVENE) 55 mg in sodium chloride 0.9 % 100 mL IVPB  Status:   Discontinued     1.25 mg/kg  45 kg100 mL/hr over 60 Minutes Intravenous Every 24 hours 02/04/14 1007 02/04/14 1450   02/04/14 1000  fluconazole (DIFLUCAN) tablet 400 mg  Status:  Discontinued     400 mg Per Tube Daily 02/03/14 0955 02/03/14 1015   02/04/14 1000  fluconazole (DIFLUCAN) 40 MG/ML suspension 400 mg  Status:  Discontinued     400 mg Per Tube Daily 02/03/14 1015 02/03/14 1136   02/04/14 1000  fluconazole (DIFLUCAN) 40 MG/ML suspension 200 mg  Status:  Discontinued     200 mg Per Tube Daily 02/03/14 1136 02/04/14 1759   02/04/14 1000  acyclovir (ZOVIRAX) 400 mg in dextrose 5 % 100 mL IVPB  Status:  Discontinued     10 mg/kg  40 kg108 mL/hr over 60 Minutes Intravenous Every 24 hours 02/03/14 1412 02/04/14 0952   02/04/14 0000  sulfamethoxazole-trimethoprim (BACTRIM) 200 mg of trimethoprim in dextrose 5 % 250 mL IVPB  Status:  Discontinued     5 mg/kg of trimethoprim  40 kg262.5 mL/hr over 60 Minutes Intravenous Every 12 hours 02/03/14 1412 02/05/14 1156   02/03/14 2000  piperacillin-tazobactam (ZOSYN) IVPB 2.25 g  Status:  Discontinued     2.25 g100 mL/hr over 30 Minutes Intravenous Every 8 hours 02/03/14 1412 02/04/14 1613   02/02/14 1400  sulfamethoxazole-trimethoprim (BACTRIM) 200 mg in dextrose 5 % 250 mL IVPB  Status:  Discontinued     15 mg/kg/day  40 kg262.5 mL/hr over 60 Minutes Intravenous 3 times per day 02/02/14 1354 02/03/14 1412   02/02/14 1300  fluconazole (DIFLUCAN) tablet 400 mg  Status:  Discontinued     400 mg Oral Daily 02/02/14 1026 02/03/14 0955   02/01/14 1700  sulfamethoxazole-trimethoprim (BACTRIM DS) 800-160 MG per tablet 1 tablet  Status:  Discontinued     1 tablet Oral Once per day on Mon Wed Fri 02/01/14 1549 02/02/14 1316   02/01/14 1600  azithromycin (ZITHROMAX) tablet 1,200 mg  Status:  Discontinued     1,200 mg Oral Weekly 02/01/14 1515 02/03/14 0955   02/01/14 1000  acyclovir (ZOVIRAX) 420 mg in dextrose 5 % 100 mL IVPB  Status:  Discontinued      10 mg/kg  41.9 kg108.4 mL/hr over 60 Minutes Intravenous Every 12 hours 02/01/14 0909 02/03/14 1412   02/01/14 1000  vancomycin (VANCOCIN) IVPB 750 mg/150 ml premix  Status:  Discontinued     750 mg150 mL/hr over 60 Minutes Intravenous Every 24 hours 02/01/14 0910 02/03/14 0910   02/01/14  0823  vancomycin (VANCOCIN) 500 mg in sodium chloride 0.9 % 100 mL IVPB  Status:  Discontinued     500 mg100 mL/hr over 60 Minutes Intravenous Every 24 hours 01/31/14 1029 02/01/14 0909   01/31/14 1200  piperacillin-tazobactam (ZOSYN) IVPB 3.375 g  Status:  Discontinued     3.375 g12.5 mL/hr over 240 Minutes Intravenous Every 8 hours 01/31/14 1029 02/03/14 1412   01/30/14 2100  acyclovir (ZOVIRAX) 390 mg in dextrose 5 % 100 mL IVPB  Status:  Discontinued     10 mg/kg  39.1 kg107.8 mL/hr over 60 Minutes Intravenous Every 24 hours 01/30/14 2018 02/01/14 0908   01/29/14 1600  acyclovir (ZOVIRAX) 420 mg in dextrose 5 % 100 mL IVPB  Status:  Discontinued     420 mg108.4 mL/hr over 60 Minutes Intravenous Every 24 hours 01/29/14 1457 01/29/14 2015   01/29/14 0800  cefTRIAXone (ROCEPHIN) 1 g in dextrose 5 % 50 mL IVPB  Status:  Discontinued     1 g100 mL/hr over 30 Minutes Intravenous Every 24 hours 01/29/14 0707 01/29/14 0710   01/29/14 0800  vancomycin (VANCOCIN) 500 mg in sodium chloride 0.9 % 100 mL IVPB  Status:  Discontinued     500 mg100 mL/hr over 60 Minutes Intravenous Every 48 hours 01/29/14 0719 01/31/14 1029   01/29/14 0730  piperacillin-tazobactam (ZOSYN) IVPB 2.25 g  Status:  Discontinued     2.25 g100 mL/hr over 30 Minutes Intravenous 4 times per day 01/29/14 0719 01/31/14 1028   01/29/14 0715  azithromycin (ZITHROMAX) 500 mg in dextrose 5 % 250 mL IVPB  Status:  Discontinued     500 mg250 mL/hr over 60 Minutes Intravenous Every 24 hours 01/29/14 0704 01/29/14 0710      Medications: Scheduled Meds: . abacavir  600 mg Per NG tube Daily  . acyclovir  10 mg/kg Intravenous Q24H  . aspirin  81 mg Oral  Daily  . [START ON 02/17/2014] azithromycin  1,200 mg Oral Weekly  . clindamycin  600 mg Oral 3 times per day  . dolutegravir  50 mg Oral BID  . feeding supplement (ENSURE COMPLETE)  237 mL Oral BID BM  . [START ON 02/20/2014] fluconazole  100 mg Per Tube Weekly  . heparin subcutaneous  5,000 Units Subcutaneous 3 times per day  . labetalol  200 mg Oral BID  . lamiVUDine  100 mg Oral Daily  . levETIRAcetam  500 mg Intravenous Q12H  . pantoprazole  40 mg Oral Daily  . predniSONE  40 mg Oral Q breakfast  . primaquine  30 mg Per Tube Q24H   Continuous Infusions:   PRN Meds:.    Objective: Weight change: 16 lb 12.1 oz (7.6 kg)  Intake/Output Summary (Last 24 hours) at 02/16/14 1252 Last data filed at 02/16/14 1000  Gross per 24 hour  Intake   1320 ml  Output   1200 ml  Net    120 ml   Blood pressure 150/72, pulse 72, temperature 97.4 F (36.3 C), temperature source Oral, resp. rate 18, height 5' 7" (1.702 m), weight 128 lb 15.5 oz (58.5 kg), SpO2 96 %. Temp:  [97.4 F (36.3 C)-97.9 F (36.6 C)] 97.4 F (36.3 C) (11/10 0551) Pulse Rate:  [69-85] 72 (11/10 1011) Resp:  [16-18] 18 (11/10 0551) BP: (150-172)/(72-94) 150/72 mmHg (11/10 1011) SpO2:  [96 %-97 %] 96 % (11/10 0551) Weight:  [128 lb 15.5 oz (58.5 kg)] 128 lb 15.5 oz (58.5 kg) (11/10 0500)  Physical Exam: General:   aox 3,answering questions HEENT: EOMI CVS tachycardical r,  no murmur rubs or gallops Chest: coarse breath sounds bilaterally Abdomen: softnondistended, normal bowel sounds, Skin: no rashes Neuro: nonfocal  CBC:  CBC Latest Ref Rng 02/16/2014 02/15/2014 02/14/2014  WBC 4.0 - 10.5 K/uL 6.0 6.1 5.4  Hemoglobin 12.0 - 15.0 g/dL 8.1(L) 8.3(L) 8.9(L)  Hematocrit 36.0 - 46.0 % 25.3(L) 25.4(L) 27.1(L)  Platelets 150 - 400 K/uL 215 269 294     BMET  Recent Labs  02/15/14 0525 02/16/14 0706  NA 143 142  K 4.7 4.4  CL 103 105  CO2 22 21  GLUCOSE 105* 75  BUN 119* 110*  CREATININE 3.16* 3.16*    CALCIUM 8.6 8.7     Liver Panel   Recent Labs  02/14/14 0605 02/16/14 0706  ALBUMIN 1.7* 1.8*       Sedimentation Rate No results for input(s): ESRSEDRATE in the last 72 hours. C-Reactive Protein No results for input(s): CRP in the last 72 hours.  Micro Results: Recent Results (from the past 720 hour(s))  Urine culture     Status: None   Collection Time: 01/27/14  3:26 PM  Result Value Ref Range Status   Specimen Description URINE, CATHETERIZED  Final   Special Requests NONE  Final   Culture  Setup Time   Final    01/27/2014 22:22 Performed at [facility]   Colony Count NO GROWTH Performed at [facility]  Final   Culture NO GROWTH Performed at [facility]  Final   Report Status 01/28/2014 FINAL  Final  Culture, blood (routine x 2)     Status: None   Collection Time: 01/29/14  9:19 AM  Result Value Ref Range Status   Specimen Description BLOOD LEFT ARM  Final   Special Requests BOTTLES DRAWN AEROBIC AND ANAEROBIC 10CC  Final   Culture  Setup Time   Final    01/29/2014 15:04 Performed at [facility]   Culture   Final    NO GROWTH 5 DAYS Performed at [facility]   Report Status 02/04/2014 FINAL  Final  Culture, blood (routine x 2)     Status: None   Collection Time: 01/29/14  9:30 AM  Result Value Ref Range Status   Specimen Description BLOOD LEFT HAND  Final   Special Requests BOTTLES DRAWN AEROBIC AND ANAEROBIC 5CC  Final   Culture  Setup Time   Final    01/29/2014 15:04 Performed at [facility]   Culture   Final    NO GROWTH 5 DAYS Performed at [facility]   Report Status 02/04/2014 FINAL  Final  Clostridium Difficile by PCR     Status: None   Collection Time: 01/29/14 11:10 AM  Result Value Ref Range Status   C difficile by pcr NEGATIVE NEGATIVE Final  MRSA PCR Screening     Status: Abnormal   Collection Time: 01/29/14  3:40 PM  Result Value Ref Range Status   MRSA by PCR POSITIVE (A)  NEGATIVE Final    Comment:        The GeneXpert MRSA Assay (FDA approved for NASAL specimens only), is one component of a comprehensive MRSA colonization surveillance program. It is not intended to diagnose MRSA infection nor to guide or monitor treatment for MRSA infections. RESULT CALLED TO, READ BACK BY AND VERIFIED WITH: P. KEATON RN 17:50 01/29/14 ([email or web address])  AFB culture, blood     Status: None (Preliminary result)   Collection   Time: 01/30/14 11:13 AM  Result Value Ref Range Status   Specimen Description BLOOD LEFT ARM  Final   Special Requests BOTTLES DRAWN AEROBIC ONLY 7CC  Final   Culture   Final    CULTURE WILL BE EXAMINED FOR 6 WEEKS BEFORE ISSUING A FINAL REPORT Performed at [facility]   Report Status PENDING  Incomplete  GC/Chlamydia Probe Amp     Status: None   Collection Time: 01/30/14 11:30 AM  Result Value Ref Range Status   CT Probe RNA NEGATIVE NEGATIVE Final   GC Probe RNA NEGATIVE NEGATIVE Final    Comment: (NOTE)                                                                                       **Normal Reference Range: Negative**      Assay performed using the Gen-Probe APTIMA COMBO2 (R) Assay. Acceptable specimen types for this assay include APTIMA Swabs (Unisex, endocervical, urethral, or vaginal), first void urine, and ThinPrep liquid based cytology samples. Performed at [facility]  CSF culture     Status: None   Collection Time: 01/30/14  5:01 PM  Result Value Ref Range Status   Specimen Description CSF  Final   Special Requests NONE  Final   Gram Stain   Final    CYTOSPIN SLIDE WBC PRESENT, PREDOMINANTLY MONONUCLEAR NO ORGANISMS SEEN Performed at [facility] Performed at [facility]   Culture   Final    NO GROWTH 3 DAYS Performed at [facility]   Report Status 02/03/2014 FINAL  Final  Gram stain     Status: None   Collection Time: 01/30/14  5:01 PM  Result Value Ref Range Status   Specimen  Description CSF  Final   Special Requests NONE  Final   Gram Stain   Final    CYTOSPIN SLIDE WBC PRESENT, PREDOMINANTLY MONONUCLEAR NO ORGANISMS SEEN   Report Status 01/30/2014 FINAL  Final  Fungus culture, blood     Status: None   Collection Time: 02/01/14  2:00 PM  Result Value Ref Range Status   Specimen Description BLOOD LEFT HAND  Final   Special Requests BOTTLES DRAWN AEROBIC AND ANAEROBIC 10CC  Final   Culture   Final    NO GROWTH 7 DAYS Performed at [facility]    Report Status 02/09/2014 FINAL  Final  Culture, blood (routine x 2)     Status: None   Collection Time: 02/02/14 12:07 AM  Result Value Ref Range Status   Specimen Description BLOOD RIGHT HAND  Final   Special Requests BOTTLES DRAWN AEROBIC ONLY 6CC  Final   Culture  Setup Time   Final    02/02/2014 03:55 Performed at [facility]    Culture   Final    NO GROWTH 5 DAYS Performed at [facility]    Report Status 02/08/2014 FINAL  Final  Culture, blood (routine x 2)     Status: None   Collection Time: 02/02/14 12:18 AM  Result Value Ref Range Status   Specimen Description BLOOD LEFT HAND  Final   Special Requests BOTTLES DRAWN   AEROBIC AND ANAEROBIC [facility] EACH  Final   Culture  Setup Time   Final    02/02/2014 03:55 Performed at [place]   Final    NO GROWTH 5 DAYS Performed at [facility]    Report Status 02/08/2014 FINAL  Final  Pneumocystis smear by DFA     Status: None   Collection Time: 02/03/14  2:30 PM  Result Value Ref Range Status   Specimen Source-PJSRC SPUTUM  Final   Pneumocystis jiroveci Ag POSITIVE  Final    Comment: Performed at [facility] RN 02/04/14 1444 BY [place]  Culture, respiratory (NON-Expectorated)     Status: None   Collection Time: 02/04/14  3:50 AM  Result Value Ref Range Status   Specimen Description TRACHEAL ASPIRATE  Final   Special Requests Immunocompromised  Final    Gram Stain   Final    FEW WBC PRESENT,BOTH PMN AND MONONUCLEAR RARE SQUAMOUS EPITHELIAL CELLS PRESENT NO ORGANISMS SEEN Performed at [facility]   Culture   Final    NO GROWTH 2 DAYS Performed at [facility]   Report Status 02/06/2014 FINAL  Final  AFB culture with smear     Status: None (Preliminary result)   Collection Time: 02/04/14  3:50 AM  Result Value Ref Range Status   Specimen Description TRACHEAL ASPIRATE  Final   Special Requests NONE  Final   Acid Fast Smear   Final    1+ ACID FAST BACILLI SEEN CRITICAL RESULT CALLED TO, READ BACK BY AND VERIFIED WITH: CIARFI 14:55 110/30/15 FUENG CRITICAL RESULT CALLED TO, READ BACK BY AND VERIFIED WITH: LEE ANN STIMPSON H.D. 10:30 02/08/14 FUENG Performed at [facility]    Culture   Final    CULTURE WILL BE EXAMINED FOR 6 WEEKS BEFORE ISSUING A FINAL REPORT Performed at [facility]    Report Status PENDING  Incomplete  Fungus Culture with Smear     Status: None (Preliminary result)   Collection Time: 02/04/14  3:50 AM  Result Value Ref Range Status   Specimen Description TRACHEAL ASPIRATE  Final   Special Requests NONE  Final   Fungal Smear   Final    NO YEAST OR FUNGAL ELEMENTS SEEN Performed at [facility]    Culture   Final    CANDIDA ALBICANS Performed at [facility]    Report Status PENDING  Incomplete  Respiratory virus panel     Status: None   Collection Time: 02/04/14  3:50 AM  Result Value Ref Range Status   Source - RVPAN TRACHEAL ASPIRATE  Final   Respiratory Syncytial Virus A NOT DETECTED  Final   Respiratory Syncytial Virus B NOT DETECTED  Final   Influenza A NOT DETECTED  Final   Influenza B NOT DETECTED  Final   Parainfluenza 1 NOT DETECTED  Final   Parainfluenza 2 NOT DETECTED  Final   Parainfluenza 3 NOT DETECTED  Final   Metapneumovirus NOT DETECTED  Final   Rhinovirus NOT DETECTED  Final   Adenovirus NOT DETECTED  Final   Influenza A H1 NOT  DETECTED  Final   Influenza A H3 NOT DETECTED  Final    Comment: (NOTE)       Normal Reference Range for each Analyte: NOT DETECTED Testing performed using the Luminex xTAG Respiratory Viral Panel test kit. The analytical performance characteristics of this assay have been determined  by [facility].  The modifications have not been cleared or approved by the FDA. This assay has been validated pursuant to the CLIA regulations and is used for clinical purposes. Performed at [facility]. Tuberculosis complex by PCR     Status: None   Collection Time: 02/04/14  3:50 AM  Result Value Ref Range Status   M. tuberculosis, Direct Not detected Not detected Final    Comment: (NOTE) This test(s) was developed and its performance characteristics  have been determined by [facility],  [place], [place]. Performance characteristics refer to the analytical  performance of the test. This test should not be used as a  substitute for culture. It should be used as an adjunct to  culture.    Source (MTBPCR) Tracheal Aspirate  Corrected    Comment: Performed at [place] 11/10 AT 0177: PREVIOUSLY REPORTED AS TRACHEAL ASPIRATE   Culture, bal-quantitative     Status: None   Collection Time: 02/04/14 10:31 AM  Result Value Ref Range Status   Specimen Description BRONCHIAL ALVEOLAR LAVAGE  Final   Special Requests Immunocompromised  Final   Gram Stain   Final    FEW WBC PRESENT, PREDOMINANTLY MONONUCLEAR RARE SQUAMOUS EPITHELIAL CELLS PRESENT NO ORGANISMS SEEN Performed at [place] Performed at [facility]  Final   Culture   Final    NO GROWTH 2 DAYS Performed at [facility]   Report Status 02/06/2014 FINAL  Final  M. Tuberculosis complex by PCR     Status: None   Collection Time: 02/06/14  6:14 PM  Result Value Ref Range Status   M. tuberculosis, Direct Not detected Not  detected Final    Comment: (NOTE) This test(s) was developed and its performance characteristics  have been determined by [facility],  [place], [place]. Performance characteristics refer to the analytical  performance of the test.    Source (MTBPCR) Sputum  Corrected    Comment: Performed at [place] 11/06 AT 0442: PREVIOUSLY REPORTED AS SPUTUM   AFB culture with smear     Status: None (Preliminary result)   Collection Time: 02/06/14  6:14 PM  Result Value Ref Range Status   Specimen Description SPUTUM  Final   Special Requests NONE  Final   Acid Fast Smear   Final    1+ ACID FAST BACILLI SEEN REFER TO PREVIOUS CULTURE [id number] FOR CALLS Performed at [facility]    Culture   Final    CULTURE WILL BE EXAMINED FOR 6 WEEKS BEFORE ISSUING A FINAL REPORT Performed at [facility]    Report Status PENDING  Incomplete  Culture, blood (routine x 2)     Status: None   Collection Time: 02/07/14  6:26 PM  Result Value Ref Range Status   Specimen Description BLOOD LEFT ARM  Final   Special Requests BOTTLES DRAWN AEROBIC AND ANAEROBIC 10CC  Final   Culture  Setup Time   Final    02/08/2014 02:25 Performed at [facility]    Culture   Final    NO GROWTH 5 DAYS Performed at [facility]    Report Status 02/15/2014 FINAL  Final  Culture, blood (routine x 2)     Status: None   Collection Time: 02/07/14  6:32 PM  Result Value Ref Range Status   Specimen Description BLOOD BLOOD LEFT FOREARM  Final  Special Requests BOTTLES DRAWN AEROBIC ONLY 10CC  Final   Culture  Setup Time   Final    02/08/2014 02:25 Performed at [place]   Final    NO GROWTH 5 DAYS Performed at [facility]    Report Status 02/15/2014 FINAL  Final    Studies/Results: No results found.    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  --continue acyclovir and aim for 21 days total (75moe days)  #2 PCP Pneumonia: continue clindamycin Primaquine and steroids for 21 day course She needs steroid tapered, will go to  [id number]  #4 Renal failure :started ARV, renal failure likely multifactorial. Cr stabilizing, improving but BUN high  #4  HIV/AIDS: started her on ARV regimen  Abacavir liquid, Epivir liquid and Tivicay   -SHE NEEDS URGENT ADAP APPROVAL, I HAVE ASKED [facility] [place] [facility] and our FINANCIAL ASSISTANT HAS COME TO BEDSIDE TODAY WITH [employer] FILL OUT PAPERWORK I WILL SEE IF I NEED TO MAKE PHONE CALL TO STATE TO EXPEDITE APPLICATION   #[id number] CULTURES FROM LUNGS LIKELY IS mYCOBACTERIUM AVIUM and I am not convinced she actually has disseminated M avium will dc M avium drugs and change to prophylactic dose  #6 Deconditioning: She is very weak and was exhausted just filling out paperwork. Would make sure she gets PT, OT nutrition assitance. NOTE DO NOT GIVE HER ENSURE WITH HER TIVICAY    LOS: 20 days   CoAlcide Evener1/01/2014, 12:52 PM

## 2014-02-17 DIAGNOSIS — I10 Essential (primary) hypertension: Secondary | ICD-10-CM | POA: Insufficient documentation

## 2014-02-17 DIAGNOSIS — R29898 Other symptoms and signs involving the musculoskeletal system: Secondary | ICD-10-CM | POA: Insufficient documentation

## 2014-02-17 DIAGNOSIS — R531 Weakness: Secondary | ICD-10-CM

## 2014-02-17 LAB — GLUCOSE, CAPILLARY
GLUCOSE-CAPILLARY: 127 mg/dL — AB (ref 70–99)
GLUCOSE-CAPILLARY: 149 mg/dL — AB (ref 70–99)
GLUCOSE-CAPILLARY: 77 mg/dL (ref 70–99)
GLUCOSE-CAPILLARY: 86 mg/dL (ref 70–99)
Glucose-Capillary: 112 mg/dL — ABNORMAL HIGH (ref 70–99)
Glucose-Capillary: 137 mg/dL — ABNORMAL HIGH (ref 70–99)
Glucose-Capillary: 78 mg/dL (ref 70–99)

## 2014-02-17 LAB — RENAL FUNCTION PANEL
Albumin: 1.7 g/dL — ABNORMAL LOW (ref 3.5–5.2)
Anion gap: 15 (ref 5–15)
BUN: 99 mg/dL — AB (ref 6–23)
CALCIUM: 8.4 mg/dL (ref 8.4–10.5)
CO2: 21 meq/L (ref 19–32)
CREATININE: 3.18 mg/dL — AB (ref 0.50–1.10)
Chloride: 109 mEq/L (ref 96–112)
GFR calc Af Amer: 19 mL/min — ABNORMAL LOW (ref >90)
GFR, EST NON AFRICAN AMERICAN: 17 mL/min — AB (ref >90)
Glucose, Bld: 77 mg/dL (ref 70–99)
Phosphorus: 7.1 mg/dL — ABNORMAL HIGH (ref 2.3–4.6)
Potassium: 4.2 mEq/L (ref 3.7–5.3)
Sodium: 145 mEq/L (ref 137–147)

## 2014-02-17 LAB — BASIC METABOLIC PANEL
Anion gap: 15 (ref 5–15)
BUN: 101 mg/dL — AB (ref 6–23)
CHLORIDE: 107 meq/L (ref 96–112)
CO2: 21 meq/L (ref 19–32)
Calcium: 8.5 mg/dL (ref 8.4–10.5)
Creatinine, Ser: 3.14 mg/dL — ABNORMAL HIGH (ref 0.50–1.10)
GFR calc Af Amer: 20 mL/min — ABNORMAL LOW (ref >90)
GFR, EST NON AFRICAN AMERICAN: 17 mL/min — AB (ref >90)
GLUCOSE: 74 mg/dL (ref 70–99)
POTASSIUM: 4.1 meq/L (ref 3.7–5.3)
Sodium: 143 mEq/L (ref 137–147)

## 2014-02-17 LAB — CLOSTRIDIUM DIFFICILE BY PCR: Toxigenic C. Difficile by PCR: NEGATIVE

## 2014-02-17 MED ORDER — LEVETIRACETAM 500 MG PO TABS
500.0000 mg | ORAL_TABLET | Freq: Two times a day (BID) | ORAL | Status: DC
  Administered 2014-02-17 – 2014-02-23 (×13): 500 mg via ORAL
  Filled 2014-02-17 (×15): qty 1

## 2014-02-17 MED ORDER — CYCLOBENZAPRINE HCL 10 MG PO TABS
10.0000 mg | ORAL_TABLET | Freq: Once | ORAL | Status: AC
  Administered 2014-02-18: 10 mg via ORAL
  Filled 2014-02-17: qty 1

## 2014-02-17 MED ORDER — PREDNISONE 20 MG PO TABS
20.0000 mg | ORAL_TABLET | Freq: Every day | ORAL | Status: DC
  Administered 2014-02-18 – 2014-02-19 (×2): 20 mg via ORAL
  Filled 2014-02-17 (×3): qty 1

## 2014-02-17 MED ORDER — PRO-STAT SUGAR FREE PO LIQD
30.0000 mL | Freq: Two times a day (BID) | ORAL | Status: DC
  Administered 2014-02-17 – 2014-02-21 (×6): 30 mL via ORAL
  Filled 2014-02-17 (×13): qty 30

## 2014-02-17 MED ORDER — HYDROCODONE-ACETAMINOPHEN 5-325 MG PO TABS
1.0000 | ORAL_TABLET | Freq: Once | ORAL | Status: AC
  Administered 2014-02-18: 1 via ORAL
  Filled 2014-02-17: qty 1

## 2014-02-17 MED ORDER — AMLODIPINE BESYLATE 5 MG PO TABS
5.0000 mg | ORAL_TABLET | Freq: Every day | ORAL | Status: DC
  Administered 2014-02-17 – 2014-02-20 (×4): 5 mg via ORAL
  Filled 2014-02-17 (×4): qty 1

## 2014-02-17 NOTE — Progress Notes (Signed)
Patient reports having pain in her neck- NP Schorr notified

## 2014-02-17 NOTE — Progress Notes (Signed)
ANTIBIOTIC CONSULT NOTE - FOLLOW UP  Pharmacy Consult for Clindamycin, Primaquine, Acyclovir, Azithromycin, Ethambutol, Rifabutin Indication: PCP, HSV encephalitis, thrush, MAC  Allergies  Allergen Reactions  . Ace Inhibitors Anaphylaxis  . Oxycodone Other (See Comments)    Patient Measurements: Height: 5\' 7"  (170.2 cm) Weight: 123 lb 10.9 oz (56.1 kg) IBW/kg (Calculated) : 61.6   Vital Signs: Temp: 98 F (36.7 C) (11/11 0500) Temp Source: Oral (11/11 0500) BP: 156/82 mmHg (11/11 0500) Pulse Rate: 77 (11/11 0500) Intake/Output from previous day: 11/10 0701 - 11/11 0700 In: 569.4 [P.O.:360; IV Piggyback:209.4] Out: 1650 [Urine:1650] Intake/Output from this shift: Total I/O In: 360 [P.O.:360] Out: 650 [Urine:650]  Labs:  Recent Labs  02/15/14 0525 02/16/14 0706 02/17/14 0544  WBC 6.1 6.0  --   HGB 8.3* 8.1*  --   PLT 269 215  --   CREATININE 3.16* 3.16* 3.18*  3.14*    Anti-infectives    Start     Dose/Rate Route Frequency Ordered Stop   02/20/14 1000  fluconazole (DIFLUCAN) 40 MG/ML suspension 100 mg     100 mg Per Tube Weekly 02/13/14 1213     02/17/14 0000  azithromycin (ZITHROMAX) tablet 1,200 mg     1,200 mg Oral Weekly 02/15/14 1309     02/15/14 1400  clindamycin (CLEOCIN) capsule 600 mg     600 mg Oral 3 times per day 02/15/14 1311     02/13/14 1300  azithromycin (ZITHROMAX) tablet 500 mg  Status:  Discontinued     500 mg Oral Daily 02/13/14 1206 02/15/14 1240   02/09/14 1000  lamiVUDine (EPIVIR) 10 MG/ML solution 100 mg     100 mg Oral Daily 02/08/14 1149     02/08/14 1600  azithromycin (ZITHROMAX) tablet 1,200 mg  Status:  Discontinued     1,200 mg Per Tube Weekly 02/03/14 0955 02/03/14 1012   02/08/14 1300  abacavir (ZIAGEN) tablet 600 mg     600 mg Per NG tube Daily 02/08/14 1115     02/08/14 1200  lamiVUDine (EPIVIR) 10 MG/ML solution 150 mg     150 mg Per Tube  Once 02/08/14 1115 02/08/14 1320   02/08/14 1200  dolutegravir (TIVICAY) tablet 50  mg    Comments:  Per NG TUBE   50 mg Oral 2 times daily 02/08/14 1115     02/08/14 1000  azithromycin (ZITHROMAX) 200 MG/5ML suspension 1,200 mg  Status:  Discontinued     1,200 mg Oral Weekly 02/03/14 1013 02/03/14 1015   02/08/14 1000  azithromycin (ZITHROMAX) 200 MG/5ML suspension 1,200 mg  Status:  Discontinued     1,200 mg Per Tube Weekly 02/03/14 1015 02/05/14 1542   02/06/14 1200  acyclovir (ZOVIRAX) 470 mg in dextrose 5 % 100 mL IVPB     10 mg/kg  47 kg109.4 mL/hr over 60 Minutes Intravenous Every 24 hours 02/06/14 1110     02/05/14 2200  clindamycin (CLEOCIN) IVPB 600 mg  Status:  Discontinued     600 mg100 mL/hr over 30 Minutes Intravenous 3 times per day 02/05/14 1542 02/15/14 1311   02/05/14 2200  primaquine tablet 30 mg     30 mg Per Tube Every 24 hours 02/05/14 1542     02/05/14 1700  azithromycin (ZITHROMAX) 200 MG/5ML suspension 600 mg  Status:  Discontinued     600 mg Per Tube Daily 02/05/14 1542 02/13/14 1206   02/05/14 1700  ethambutol (MYAMBUTOL) tablet 800 mg  Status:  Discontinued     800  mg Oral Every 24 hours 02/05/14 1542 02/05/14 1545   02/05/14 1700  ethambutol (MYAMBUTOL) tablet 800 mg  Status:  Discontinued     800 mg Per Tube Every 24 hours 02/05/14 1545 02/15/14 1240   02/05/14 1600  rifabutin (MYCOBUTIN) capsule 150 mg  Status:  Discontinued     150 mg Oral Daily 02/05/14 1542 02/15/14 1240   02/05/14 1200  acyclovir (ZOVIRAX) 450 mg in dextrose 5 % 100 mL IVPB  Status:  Discontinued     10 mg/kg  45 kg109 mL/hr over 60 Minutes Intravenous Every 24 hours 02/04/14 1554 02/04/14 1601   02/05/14 1200  acyclovir (ZOVIRAX) 400 mg in dextrose 5 % 100 mL IVPB  Status:  Discontinued     10 mg/kg  40 kg (Order-Specific)108 mL/hr over 60 Minutes Intravenous Every 24 hours 02/04/14 1601 02/06/14 1110   02/05/14 1200  sulfamethoxazole-trimethoprim (BACTRIM) 240 mg of trimethoprim in dextrose 5 % 250 mL IVPB  Status:  Discontinued     240 mg of trimethoprim265 mL/hr  over 60 Minutes Intravenous Every 12 hours 02/05/14 1156 02/05/14 1510   02/05/14 1000  fluconazole (DIFLUCAN) 40 MG/ML suspension 100 mg  Status:  Discontinued     100 mg Per Tube Daily 02/04/14 1759 02/13/14 1213   02/04/14 2000  piperacillin-tazobactam (ZOSYN) IVPB 2.25 g     2.25 g100 mL/hr over 30 Minutes Intravenous Every 8 hours 02/04/14 1613 02/04/14 2121   02/04/14 1100  ganciclovir (CYTOVENE) 55 mg in sodium chloride 0.9 % 100 mL IVPB  Status:  Discontinued     1.25 mg/kg  45 kg100 mL/hr over 60 Minutes Intravenous Every 24 hours 02/04/14 1007 02/04/14 1450   02/04/14 1000  fluconazole (DIFLUCAN) tablet 400 mg  Status:  Discontinued     400 mg Per Tube Daily 02/03/14 0955 02/03/14 1015   02/04/14 1000  fluconazole (DIFLUCAN) 40 MG/ML suspension 400 mg  Status:  Discontinued     400 mg Per Tube Daily 02/03/14 1015 02/03/14 1136   02/04/14 1000  fluconazole (DIFLUCAN) 40 MG/ML suspension 200 mg  Status:  Discontinued     200 mg Per Tube Daily 02/03/14 1136 02/04/14 1759   02/04/14 1000  acyclovir (ZOVIRAX) 400 mg in dextrose 5 % 100 mL IVPB  Status:  Discontinued     10 mg/kg  40 kg108 mL/hr over 60 Minutes Intravenous Every 24 hours 02/03/14 1412 02/04/14 0952   02/04/14 0000  sulfamethoxazole-trimethoprim (BACTRIM) 200 mg of trimethoprim in dextrose 5 % 250 mL IVPB  Status:  Discontinued     5 mg/kg of trimethoprim  40 kg262.5 mL/hr over 60 Minutes Intravenous Every 12 hours 02/03/14 1412 02/05/14 1156   02/03/14 2000  piperacillin-tazobactam (ZOSYN) IVPB 2.25 g  Status:  Discontinued     2.25 g100 mL/hr over 30 Minutes Intravenous Every 8 hours 02/03/14 1412 02/04/14 1613   02/02/14 1400  sulfamethoxazole-trimethoprim (BACTRIM) 200 mg in dextrose 5 % 250 mL IVPB  Status:  Discontinued     15 mg/kg/day  40 kg262.5 mL/hr over 60 Minutes Intravenous 3 times per day 02/02/14 1354 02/03/14 1412   02/02/14 1300  fluconazole (DIFLUCAN) tablet 400 mg  Status:  Discontinued     400 mg  Oral Daily 02/02/14 1026 02/03/14 0955   02/01/14 1700  sulfamethoxazole-trimethoprim (BACTRIM DS) 800-160 MG per tablet 1 tablet  Status:  Discontinued     1 tablet Oral Once per day on Mon Wed Fri 02/01/14 1549 02/02/14 1316   02/01/14  1600  azithromycin (ZITHROMAX) tablet 1,200 mg  Status:  Discontinued     1,200 mg Oral Weekly 02/01/14 1515 02/03/14 0955   02/01/14 1000  acyclovir (ZOVIRAX) 420 mg in dextrose 5 % 100 mL IVPB  Status:  Discontinued     10 mg/kg  41.9 kg108.4 mL/hr over 60 Minutes Intravenous Every 12 hours 02/01/14 0909 02/03/14 1412   02/01/14 1000  vancomycin (VANCOCIN) IVPB 750 mg/150 ml premix  Status:  Discontinued     750 mg150 mL/hr over 60 Minutes Intravenous Every 24 hours 02/01/14 0910 02/03/14 0910   02/01/14 0823  vancomycin (VANCOCIN) 500 mg in sodium chloride 0.9 % 100 mL IVPB  Status:  Discontinued     500 mg100 mL/hr over 60 Minutes Intravenous Every 24 hours 01/31/14 1029 02/01/14 0909   01/31/14 1200  piperacillin-tazobactam (ZOSYN) IVPB 3.375 g  Status:  Discontinued     3.375 g12.5 mL/hr over 240 Minutes Intravenous Every 8 hours 01/31/14 1029 02/03/14 1412   01/30/14 2100  acyclovir (ZOVIRAX) 390 mg in dextrose 5 % 100 mL IVPB  Status:  Discontinued     10 mg/kg  39.1 kg107.8 mL/hr over 60 Minutes Intravenous Every 24 hours 01/30/14 2018 02/01/14 0908   01/29/14 1600  acyclovir (ZOVIRAX) 420 mg in dextrose 5 % 100 mL IVPB  Status:  Discontinued     420 mg108.4 mL/hr over 60 Minutes Intravenous Every 24 hours 01/29/14 1457 01/29/14 2015   01/29/14 0800  cefTRIAXone (ROCEPHIN) 1 g in dextrose 5 % 50 mL IVPB  Status:  Discontinued     1 g100 mL/hr over 30 Minutes Intravenous Every 24 hours 01/29/14 0707 01/29/14 0710   01/29/14 0800  vancomycin (VANCOCIN) 500 mg in sodium chloride 0.9 % 100 mL IVPB  Status:  Discontinued     500 mg100 mL/hr over 60 Minutes Intravenous Every 48 hours 01/29/14 0719 01/31/14 1029   01/29/14 0730  piperacillin-tazobactam  (ZOSYN) IVPB 2.25 g  Status:  Discontinued     2.25 g100 mL/hr over 30 Minutes Intravenous 4 times per day 01/29/14 0719 01/31/14 1028   01/29/14 0715  azithromycin (ZITHROMAX) 500 mg in dextrose 5 % 250 mL IVPB  Status:  Discontinued     500 mg250 mL/hr over 60 Minutes Intravenous Every 24 hours 01/29/14 0704 01/29/14 0710      Assessment: 43 yoF with newly diagnosed HIV. Patient on Acyclovir  for HSV encephalitis, Clinda/Primaquine and steroids for PCP. 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  Remains on Abacavir, Tivicay, and Epivir for HIV.  Her SCr peaked on 11/3 at 3.78, is now down to 3.18 and appears to have plateaued. Weight has increased about 9 kg over admission.  No medication adjustments need at this time.   Goal of Therapy:  Clinical resolution of infection  Plan:  Diflucan 100 mg per tube once weekly  Acyclovir 470 mg IV q24h - to end on 11/13 Clinda 600 mg IV q8h/Primaquine 30 mg per tube q24h  Azithro 1200 mg PO once weekly Abacavir 600 mg, Tivicay 50 mg, Epivir 100 mg  Monitor micro data, renal function and adjust medications as needed    Agapito Games, PharmD, BCPS Clinical Pharmacist Pager: [phone] 02/17/2014 1:23 PM

## 2014-02-17 NOTE — Progress Notes (Signed)
NUTRITION FOLLOW UP  Intervention:   -Continue Ensure Complete po BID, each supplement provides 350 kcal and 13 grams of protein -30 ml Prostat BID  Nutrition Dx:   Inadequate oral intake related to inability to eat, acute encephalopathy as evidenced by NPO status, ongoing.  Goal:   Intake to meet >90% of estimated nutrition needs; goal not met  Monitor:   PO/supplement intake, labs, weight changes, I/O's  Assessment:   43 year old Female with PMH of HTN, marijuana use who presented to [facility] ED 01/27/2014 after she was found unresponsive at home by a family member. CT head showed no acute intracranial findings. Chest x-ray showed no active disease. Patient has received fluid boluses in ED. Patient was admitted for further evaluation and management of altered mental status, dehydration.  Patient with + HIV test, new diagnosis.  Received consult to assess nutritional needs. Pt has been followed by clinical nutrition staff since admission. Pt unavailable at time of visit.  Pt has Ensure ordered BID and is taking well per MAR. Intake is also good;  PO: 25-100%.  Due to pressure ulcer and increased nutrition needs, will also add protein modular for additional nutrition support.   Height: Ht Readings from Last 1 Encounters:  02/09/14 5' 7"  (1.702 m)    Weight Status:   Wt Readings from Last 1 Encounters:  02/17/14 123 lb 10.9 oz (56.1 kg)    Re-estimated needs:  Kcal: 1600-1800 Protein: 67-77 grams Fluid: 1.6-1.8 L  Skin: MASD on buttocks, stage II pressure ulcer on sacrum  Diet Order: Diet regular   Intake/Output Summary (Last 24 hours) at 02/17/14 1234 Last data filed at 02/17/14 1000  Gross per 24 hour  Intake  569.4 ml  Output   1650 ml  Net -1080.6 ml    Last BM: 02/16/14   Labs:   Recent Labs Lab 02/13/14 0415 02/14/14 0605 02/15/14 0525 02/16/14 0706 02/17/14 0544  NA  --  142 143 142 145  143  K  --  4.3 4.7 4.4 4.2  4.1  CL  --  103 103 105 109   107  CO2  --  23 22 21 21  21   BUN  --  113* 119* 110* 99*  101*  CREATININE  --  3.01* 3.16* 3.16* 3.18*  3.14*  CALCIUM  --  8.8 8.6 8.7 8.4  8.5  MG 2.1 2.0 2.0  --   --   PHOS  --  7.8*  --  8.3* 7.1*  GLUCOSE  --  90 105* 75 77  74    CBG (last 3)   Recent Labs  02/16/14 2354 02/17/14 0427 02/17/14 0808  GLUCAP 78 77 86    Scheduled Meds: . abacavir  600 mg Per NG tube Daily  . acyclovir  10 mg/kg Intravenous Q24H  . aspirin  81 mg Oral Daily  . azithromycin  1,200 mg Oral Weekly  . clindamycin  600 mg Oral 3 times per day  . dolutegravir  50 mg Oral BID  . feeding supplement (ENSURE COMPLETE)  237 mL Oral BID BM  . [START ON 02/20/2014] fluconazole  100 mg Per Tube Weekly  . heparin subcutaneous  5,000 Units Subcutaneous 3 times per day  . labetalol  200 mg Oral BID  . lamiVUDine  100 mg Oral Daily  . levETIRAcetam  500 mg Oral BID  . pantoprazole  40 mg Oral Daily  . predniSONE  40 mg Oral Q breakfast  . primaquine  30  mg Per Tube Q24H    Continuous Infusions:   Allex Madia A. Jimmye Norman, RD, LDN Pager: [phone] After hours Pager: [phone]

## 2014-02-17 NOTE — Progress Notes (Signed)
Palliative Medicine Team  Ms. Desiree Hale is overwhelmed by her acute illness and is extremely deconditioned but much more motivated and mentally clear. She is open to palliative care services following her at SNF and her ultimate goal is to go home and to have a complete recovery. She will need careful care coordination and support during rehab, in treating her serious opportunistic infections in setting of advanced HIV and long term support through life long HIV treatment. Recommend palliative consultation at SNF to assist with symptom management needs and care navigation. PMT will sign off for now- please do not hesitate to re-consult us if her condition changes or we can be of further assistance [phone]. I have placed an order for Chaplain or CSW to assist her with Advance Care Planning Documents.  Desiree MaltaElizabeth Renea Schoonmaker, DO Palliative Medicine [phone]

## 2014-02-17 NOTE — Progress Notes (Signed)
PROGRESS NOTE  Desiree Hale WJX:[id number] DOB: [birth year] DOA: 01/27/2014 PCP: No primary care provider on file.  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 She was initially admitted with acute encephalopathy on the hospitalist service, then decompensated from a respiratory standpoint and was intubated 10/28. Patient's respiratory status has gradually improved and was able to be extubated on 11/5, and currently she is comfortable on room air. Her course was complicated by acute renal failure thought to be multifactorial due to nephrotoxic agents, ATN.  Her acute encephalopathy has been very slow to improve and she has intermittent periods of confusion alternating with alertness. On 11/10 her cognitive ability is significantly improved.   Infectious disease, nephrology, PCCM and as well as palliative care had been involved in her care and are following.  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 Otherwise neg acute.  10/22 EEG >>> normal drowsy and asleep electroencephalogram. No epileptiform activity is noted.  10/22 ECHO >>> EF 55-60%, grade 1 diastolic dysfunction, no RWMA, small free-flowing pericardial effusion 10/28 Worsening tachypnea, AMS, tx ICU. Required intubation  10/30 Renal US >> kidneys diffusely echogenic, concerning for medical renal disease 10/31 Remains on full support, fent gtt, received 1 unit PRBC's  11/01 More awake, anemia improved 11/02 CXR Mild infiltrate right lower lobe cannot be excluded  11/02 ID started ARV therapy . Renal - broad ddx for ATN and unstable for biopsy 02/11/14: cognition and breathing status continue to improve . Extubated 02/14/14 TRH took over  Subjective/ 24 H Interval events Patient a little less coherent, but very pleasant.  No complaints.  Wants to go home  and cook for her family.  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 Patient intubated from 10/28 until 11/5.  Now breathing comfortably on room air. List CXR 11/5 clear  Pneumocystis pneumonia - continue antibiotics and steroids.  Patient with out resp distress or wheeze.  Tapering steroids (20 mg prednisone starting 11/12).  She is on clindamycin, primaquine.  Being followed by ID.  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  She needs 2 more days of IV acyclovir.  Mental status much improved.  Elevated troponin level - in the setting of demand ischemia and acute renal failure. Patient denies chest pain.  Acute on chronic diastolic heart failure - On beta blocker.  No sign of volume overload.   Acute on chronic renal failure with metabolic acidosis - Likely ATN from antibioitics with possible ischemia from hypotension. Nephrology was following.  They were concerned for HIV related kidney disease, but unfortunately patient has been too  sick to biopsy.  BUN/CR is stable.  Nephrology has followed her 2x during this hospitalization and has signed off.  Protein calorie malnutrition - nutrition consulted.  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  Initially felt to be a new diagnosis, however she knew about her HIV diagnosis since 2008.  On fluconazole weekly.  ID working to gain urgent ADAP approval.  Need to gain this prior to discharge.    Seizure disorder - stable.  She is on Keppra  Hypertension - moderately controlled.  Added norvasc 11/11.   Continue labetalol  Diet: regular Fluids: none  DVT Prophylaxis: heparin  Code Status: Full Family Communication:  Disposition Plan: remain inpatient.  PT consult pending.  Consultants:  ID  Nephrology  PCCM  Palliative  Procedures:  2D echo - normal EF 55-60%, grade 1 diastolic dysfunction  Antibiotics Anti-infectives    Start     Dose/Rate Route Frequency Ordered Stop   02/20/14 1000  fluconazole (DIFLUCAN) 40 MG/ML suspension 100 mg     100 mg Per Tube Weekly 02/13/14 1213     02/17/14 0000  azithromycin (ZITHROMAX) tablet 1,200 mg     1,200 mg Oral Weekly 02/15/14 1309     02/15/14 1400  clindamycin (CLEOCIN) capsule 600 mg     600 mg Oral 3 times per day 02/15/14 1311     02/13/14 1300  azithromycin (ZITHROMAX) tablet 500 mg  Status:  Discontinued     500 mg Oral Daily 02/13/14 1206 02/15/14 1240   02/09/14 1000  lamiVUDine (EPIVIR) 10 MG/ML solution 100 mg     100 mg Oral Daily 02/08/14 1149     02/08/14 1600  azithromycin (ZITHROMAX) tablet 1,200 mg  Status:  Discontinued     1,200 mg Per Tube Weekly 02/03/14 0955 02/03/14 1012   02/08/14 1300  abacavir (ZIAGEN) tablet 600 mg     600 mg Per NG tube Daily 02/08/14 1115     02/08/14 1200  lamiVUDine (EPIVIR) 10 MG/ML solution 150 mg     150 mg Per Tube  Once 02/08/14 1115 02/08/14 1320   02/08/14 1200  dolutegravir (TIVICAY) tablet 50 mg    Comments:  Per NG TUBE   50 mg Oral 2 times daily 02/08/14 1115     02/08/14 1000  azithromycin  (ZITHROMAX) 200 MG/5ML suspension 1,200 mg  Status:  Discontinued     1,200 mg Oral Weekly 02/03/14 1013 02/03/14 1015   02/08/14 1000  azithromycin (ZITHROMAX) 200 MG/5ML suspension 1,200 mg  Status:  Discontinued     1,200 mg Per Tube Weekly 02/03/14 1015 02/05/14 1542   02/06/14 1200  acyclovir (ZOVIRAX) 470 mg in dextrose 5 % 100 mL IVPB     10 mg/kg  47 kg109.4 mL/hr over 60 Minutes Intravenous Every 24 hours 02/06/14 1110     02/05/14 2200  clindamycin (CLEOCIN) IVPB 600 mg  Status:  Discontinued     600 mg100 mL/hr over 30 Minutes Intravenous 3 times per day 02/05/14 1542 02/15/14 1311   02/05/14 2200  primaquine tablet 30 mg     30 mg Per Tube Every 24 hours 02/05/14 1542     02/05/14 1700  azithromycin (ZITHROMAX) 200 MG/5ML suspension 600 mg  Status:  Discontinued     600 mg Per Tube Daily 02/05/14 1542 02/13/14 1206   02/05/14 1700  ethambutol (MYAMBUTOL) tablet 800 mg  Status:  Discontinued     800 mg Oral Every 24 hours 02/05/14 1542 02/05/14 1545   02/05/14 1700  ethambutol (MYAMBUTOL) tablet 800 mg  Status:  Discontinued     800 mg Per Tube Every 24 hours 02/05/14 1545 02/15/14 1240   02/05/14 1600  rifabutin (MYCOBUTIN) capsule 150 mg  Status:  Discontinued     150 mg Oral Daily 02/05/14 1542 02/15/14 1240   02/05/14 1200  acyclovir (ZOVIRAX) 450 mg in dextrose 5 % 100 mL IVPB  Status:  Discontinued     10 mg/kg  45 kg109 mL/hr over 60 Minutes Intravenous Every 24 hours 02/04/14 1554 02/04/14 1601   02/05/14 1200  acyclovir (ZOVIRAX) 400 mg in dextrose 5 % 100 mL IVPB  Status:  Discontinued     10 mg/kg  40 kg (Order-Specific)108 mL/hr over 60 Minutes Intravenous Every 24 hours 02/04/14 1601 02/06/14 1110   02/05/14 1200  sulfamethoxazole-trimethoprim (BACTRIM) 240 mg of trimethoprim in dextrose 5 % 250 mL IVPB  Status:  Discontinued     240 mg of trimethoprim265 mL/hr over 60 Minutes Intravenous Every 12 hours 02/05/14 1156 02/05/14 1510   02/05/14 1000  fluconazole  (DIFLUCAN) 40 MG/ML suspension 100 mg  Status:  Discontinued     100 mg Per Tube Daily 02/04/14 1759 02/13/14 1213   02/04/14 2000  piperacillin-tazobactam (ZOSYN) IVPB 2.25 g     2.25 g100 mL/hr over 30 Minutes Intravenous Every 8 hours 02/04/14 1613 02/04/14 2121   02/04/14 1100  ganciclovir (CYTOVENE) 55 mg in sodium chloride 0.9 % 100 mL IVPB  Status:  Discontinued     1.25 mg/kg  45 kg100 mL/hr over 60 Minutes Intravenous Every 24 hours 02/04/14 1007 02/04/14 1450   02/04/14 1000  fluconazole (DIFLUCAN) tablet 400 mg  Status:  Discontinued     400 mg Per Tube Daily 02/03/14 0955 02/03/14 1015   02/04/14 1000  fluconazole (DIFLUCAN) 40 MG/ML suspension 400 mg  Status:  Discontinued     400 mg Per Tube Daily 02/03/14 1015 02/03/14 1136   02/04/14 1000  fluconazole (DIFLUCAN) 40 MG/ML suspension 200 mg  Status:  Discontinued     200 mg Per Tube Daily 02/03/14 1136 02/04/14 1759   02/04/14 1000  acyclovir (ZOVIRAX) 400 mg in dextrose 5 % 100 mL IVPB  Status:  Discontinued     10 mg/kg  40 kg108 mL/hr over 60 Minutes Intravenous Every 24 hours 02/03/14 1412 02/04/14 0952   02/04/14 0000  sulfamethoxazole-trimethoprim (BACTRIM) 200 mg of trimethoprim in dextrose 5 % 250 mL IVPB  Status:  Discontinued     5 mg/kg of trimethoprim  40 kg262.5 mL/hr over 60 Minutes Intravenous Every 12 hours 02/03/14 1412 02/05/14 1156   02/03/14 2000  piperacillin-tazobactam (ZOSYN) IVPB 2.25 g  Status:  Discontinued     2.25 g100 mL/hr over 30 Minutes Intravenous Every 8 hours 02/03/14 1412 02/04/14 1613   02/02/14 1400  sulfamethoxazole-trimethoprim (BACTRIM) 200 mg in dextrose 5 % 250 mL IVPB  Status:  Discontinued     15 mg/kg/day  40 kg262.5 mL/hr over 60 Minutes Intravenous 3 times per day 02/02/14 1354 02/03/14 1412   02/02/14 1300  fluconazole (DIFLUCAN) tablet 400 mg  Status:  Discontinued     400 mg Oral Daily 02/02/14 1026 02/03/14 0955   02/01/14 1700  sulfamethoxazole-trimethoprim (BACTRIM DS)  800-160 MG per tablet 1 tablet  Status:  Discontinued     1 tablet Oral Once per day on Mon Wed  Fri 02/01/14 1549 02/02/14 1316   02/01/14 1600  azithromycin (ZITHROMAX) tablet 1,200 mg  Status:  Discontinued     1,200 mg Oral Weekly 02/01/14 1515 02/03/14 0955   02/01/14 1000  acyclovir (ZOVIRAX) 420 mg in dextrose 5 % 100 mL IVPB  Status:  Discontinued     10 mg/kg  41.9 kg108.4 mL/hr over 60 Minutes Intravenous Every 12 hours 02/01/14 0909 02/03/14 1412   02/01/14 1000  vancomycin (VANCOCIN) IVPB 750 mg/150 ml premix  Status:  Discontinued     750 mg150 mL/hr over 60 Minutes Intravenous Every 24 hours 02/01/14 0910 02/03/14 0910   02/01/14 0823  vancomycin (VANCOCIN) 500 mg in sodium chloride 0.9 % 100 mL IVPB  Status:  Discontinued     500 mg100 mL/hr over 60 Minutes Intravenous Every 24 hours 01/31/14 1029 02/01/14 0909   01/31/14 1200  piperacillin-tazobactam (ZOSYN) IVPB 3.375 g  Status:  Discontinued     3.375 g12.5 mL/hr over 240 Minutes Intravenous Every 8 hours 01/31/14 1029 02/03/14 1412   01/30/14 2100  acyclovir (ZOVIRAX) 390 mg in dextrose 5 % 100 mL IVPB  Status:  Discontinued     10 mg/kg  39.1 kg107.8 mL/hr over 60 Minutes Intravenous Every 24 hours 01/30/14 2018 02/01/14 0908   01/29/14 1600  acyclovir (ZOVIRAX) 420 mg in dextrose 5 % 100 mL IVPB  Status:  Discontinued     420 mg108.4 mL/hr over 60 Minutes Intravenous Every 24 hours 01/29/14 1457 01/29/14 2015   01/29/14 0800  cefTRIAXone (ROCEPHIN) 1 g in dextrose 5 % 50 mL IVPB  Status:  Discontinued     1 g100 mL/hr over 30 Minutes Intravenous Every 24 hours 01/29/14 0707 01/29/14 0710   01/29/14 0800  vancomycin (VANCOCIN) 500 mg in sodium chloride 0.9 % 100 mL IVPB  Status:  Discontinued     500 mg100 mL/hr over 60 Minutes Intravenous Every 48 hours 01/29/14 0719 01/31/14 1029   01/29/14 0730  piperacillin-tazobactam (ZOSYN) IVPB 2.25 g  Status:  Discontinued     2.25 g100 mL/hr over 30 Minutes Intravenous 4 times per  day 01/29/14 0719 01/31/14 1028   01/29/14 0715  azithromycin (ZITHROMAX) 500 mg in dextrose 5 % 250 mL IVPB  Status:  Discontinued     500 mg250 mL/hr over 60 Minutes Intravenous Every 24 hours 01/29/14 0704 01/29/14 0710        Studies  CXR 11/5 There is no evidence of pneumonia nor other acute cardiopulmonary abnormality.  Objective  Filed Vitals:   02/17/14 0058 02/17/14 0500 02/17/14 0529 02/17/14 1500  BP: 163/89 156/82  163/88  Pulse:  77  80  Temp:  98 F (36.7 C)  98.9 F (37.2 C)  TempSrc:  Oral  Oral  Resp:  15  16  Height:      Weight:   56.1 kg (123 lb 10.9 oz)   SpO2:  99%  99%    Intake/Output Summary (Last 24 hours) at 02/17/14 1631 Last data filed at 02/17/14 1400  Gross per 24 hour  Intake  809.4 ml  Output   2300 ml  Net -1490.6 ml   Filed Weights   02/15/14 0556 02/16/14 0500 02/17/14 0529  Weight: 50.9 kg (112 lb 3.4 oz) 58.5 kg (128 lb 15.5 oz) 56.1 kg (123 lb 10.9 oz)    Exam:  General:  Wd, AA female, lying in bed. NAD.  Comfortable. Unchanged.  Cardiovascular: RRR, no frank m/r/g, no lower extremity edema  Respiratory: CTA  biL on anterior auscultation, no accessory muscle use.  Abdomen: soft, nontender, nd, +bs, no masses  MSK: no peripheral edema, able to move all 4 extremities.  Psych: Appropriate, cooperative. Mildly confused today.  Data Reviewed: Basic Metabolic Panel:  Recent Labs Lab 02/12/14 0500 02/13/14 0412 02/13/14 0415 02/14/14 0605 02/15/14 0525 02/16/14 0706 02/17/14 0544  NA 145 138  --  142 143 142 145  143  K 5.3 4.5  --  4.3 4.7 4.4 4.2  4.1  CL 103 99  --  103 103 105 109  107  CO2 26 25  --  23 22 21 21  21   GLUCOSE 163* 107*  --  90 105* 75 77  74  BUN 99* 105*  --  113* 119* 110* 99*  101*  CREATININE 3.20* 2.94*  --  3.01* 3.16* 3.16* 3.18*  3.14*  CALCIUM 8.8 8.5  --  8.8 8.6 8.7 8.4  8.5  MG  --   --  2.1 2.0 2.0  --   --   PHOS 7.7* 6.6*  --  7.8*  --  8.3* 7.1*   Liver Function  Tests:  Recent Labs Lab 02/12/14 0500 02/13/14 0412 02/14/14 0605 02/16/14 0706 02/17/14 0544  ALBUMIN 1.9* 1.7* 1.7* 1.8* 1.7*   CBC:  Recent Labs Lab 02/12/14 0410 02/13/14 0415 02/14/14 0605 02/15/14 0525 02/16/14 0706  WBC 8.0 6.3 5.4 6.1 6.0  HGB 8.5* 9.0* 8.9* 8.3* 8.1*  HCT 26.3* 27.0* 27.1* 25.4* 25.3*  MCV 78.0 76.9* 78.8 78.9 81.6  PLT 323 306 294 269 215   CBG:  Recent Labs Lab 02/16/14 2030 02/16/14 2354 02/17/14 0427 02/17/14 0808 02/17/14 1246  GLUCAP 94 78 77 86 149*    Recent Results (from the past 240 hour(s))  Culture, blood (routine x 2)     Status: None   Collection Time: 02/07/14  6:26 PM  Result Value Ref Range Status   Specimen Description BLOOD LEFT ARM  Final   Special Requests BOTTLES DRAWN AEROBIC AND ANAEROBIC 10CC  Final   Culture  Setup Time   Final    02/08/2014 02:25 Performed at [facility]    Culture   Final    NO GROWTH 5 DAYS Performed at [facility]    Report Status 02/15/2014 FINAL  Final  Culture, blood (routine x 2)     Status: None   Collection Time: 02/07/14  6:32 PM  Result Value Ref Range Status   Specimen Description BLOOD BLOOD LEFT FOREARM  Final   Special Requests BOTTLES DRAWN AEROBIC ONLY 10CC  Final   Culture  Setup Time   Final    02/08/2014 02:25 Performed at [facility]    Culture   Final    NO GROWTH 5 DAYS Performed at [facility]    Report Status 02/15/2014 FINAL  Final  Clostridium Difficile by PCR     Status: None   Collection Time: 02/17/14  2:36 AM  Result Value Ref Range Status   C difficile by pcr NEGATIVE NEGATIVE Final     Scheduled Meds: . abacavir  600 mg Per NG tube Daily  . acyclovir  10 mg/kg Intravenous Q24H  . amLODipine  5 mg Oral Daily  . aspirin  81 mg Oral Daily  . azithromycin  1,200 mg Oral Weekly  . clindamycin  600 mg Oral 3 times per day  . dolutegravir  50 mg Oral BID  . feeding supplement (ENSURE COMPLETE)  237 mL Oral  BID BM  . feeding supplement (PRO-STAT SUGAR FREE 64)  30 mL Oral BID BM  . [START ON 02/20/2014] fluconazole  100 mg Per Tube Weekly  . heparin subcutaneous  5,000 Units Subcutaneous 3 times per day  . labetalol  200 mg Oral BID  . lamiVUDine  100 mg Oral Daily  . levETIRAcetam  500 mg Oral BID  . pantoprazole  40 mg Oral Daily  . [START ON 02/18/2014] predniSONE  20 mg Oral Q breakfast  . primaquine  30 mg Per Tube Q24H   Continuous Infusions:   Time spent: 45 minutes  Algis DownsMarianne York, New JerseyPA-C [facility] Pager [phone]. If 7 PM - 7 AM, please contact night-coverage at [email or web address], password [email or web address] 02/17/2014, 4:31 PM  LOS: 21 days

## 2014-02-17 NOTE — Progress Notes (Signed)
[facility]    Day 19 Acyclovir DAy 14 pcp rx  IV clinda/primaquin )  On weekly prophlyactic azithromycin now and prophylactic fluconazole  10 days emb/rifabutin/azithro  Day 14 fluconazole  Zosyn D/cd after 7 days    Subjective: Wanted to move to chair but is on bedrest. Positioned so she could eat. Wants to go home to be with her son   Antibiotics:  Anti-infectives    Start     Dose/Rate Route Frequency Ordered Stop   02/20/14 1000  fluconazole (DIFLUCAN) 40 MG/ML suspension 100 mg     100 mg Per Tube Weekly 02/13/14 1213     02/17/14 0000  azithromycin (ZITHROMAX) tablet 1,200 mg     1,200 mg Oral Weekly 02/15/14 1309     02/15/14 1400  clindamycin (CLEOCIN) capsule 600 mg     600 mg Oral 3 times per day 02/15/14 1311     02/13/14 1300  azithromycin (ZITHROMAX) tablet 500 mg  Status:  Discontinued     500 mg Oral Daily 02/13/14 1206 02/15/14 1240   02/09/14 1000  lamiVUDine (EPIVIR) 10 MG/ML solution 100 mg     100 mg Oral Daily 02/08/14 1149     02/08/14 1600  azithromycin (ZITHROMAX) tablet 1,200 mg  Status:  Discontinued     1,200 mg Per Tube Weekly 02/03/14 0955 02/03/14 1012   02/08/14 1300  abacavir (ZIAGEN) tablet 600 mg     600 mg Per NG tube Daily 02/08/14 1115     02/08/14 1200  lamiVUDine (EPIVIR) 10 MG/ML solution 150 mg     150 mg Per Tube  Once 02/08/14 1115 02/08/14 1320   02/08/14 1200  dolutegravir (TIVICAY) tablet 50 mg    Comments:  Per NG TUBE   50 mg Oral 2 times daily 02/08/14 1115     02/08/14 1000  azithromycin (ZITHROMAX) 200 MG/5ML suspension 1,200 mg  Status:  Discontinued     1,200 mg Oral Weekly 02/03/14 1013 02/03/14 1015   02/08/14 1000  azithromycin (ZITHROMAX) 200 MG/5ML suspension 1,200 mg  Status:  Discontinued     1,200 mg Per Tube Weekly 02/03/14 1015 02/05/14 1542   02/06/14 1200   acyclovir (ZOVIRAX) 470 mg in dextrose 5 % 100 mL IVPB     10 mg/kg  47 kg109.4 mL/hr over 60 Minutes Intravenous Every 24 hours 02/06/14 1110     02/05/14 2200  clindamycin (CLEOCIN) IVPB 600 mg  Status:  Discontinued     600 mg100 mL/hr over 30 Minutes Intravenous 3 times per day 02/05/14 1542 02/15/14 1311   02/05/14 2200  primaquine tablet 30 mg     30 mg Per Tube Every 24 hours 02/05/14 1542     02/05/14 1700  azithromycin (ZITHROMAX) 200 MG/5ML suspension 600 mg  Status:  Discontinued     600 mg Per Tube Daily 02/05/14 1542 02/13/14 1206   02/05/14 1700  ethambutol (MYAMBUTOL) tablet 800 mg  Status:  Discontinued     800 mg Oral Every 24 hours 02/05/14 1542 02/05/14 1545   02/05/14 1700  ethambutol (MYAMBUTOL) tablet 800 mg  Status:  Discontinued     800 mg Per Tube Every 24 hours 02/05/14 1545 02/15/14 1240   02/05/14 1600  rifabutin (MYCOBUTIN) capsule 150 mg  Status:  Discontinued     150 mg Oral Daily 02/05/14 1542 02/15/14 1240   02/05/14 1200  acyclovir (ZOVIRAX) 450 mg in dextrose 5 % 100 mL IVPB  Status:  Discontinued     10 mg/kg  45 kg109 mL/hr over 60 Minutes Intravenous Every 24 hours 02/04/14 1554 02/04/14 1601   02/05/14 1200  acyclovir (ZOVIRAX) 400 mg in dextrose 5 % 100 mL IVPB  Status:  Discontinued     10 mg/kg  40 kg (Order-Specific)108 mL/hr over 60 Minutes Intravenous Every 24 hours 02/04/14 1601 02/06/14 1110   02/05/14 1200  sulfamethoxazole-trimethoprim (BACTRIM) 240 mg of trimethoprim in dextrose 5 % 250 mL IVPB  Status:  Discontinued     240 mg of trimethoprim265 mL/hr over 60 Minutes Intravenous Every 12 hours 02/05/14 1156 02/05/14 1510   02/05/14 1000  fluconazole (DIFLUCAN) 40 MG/ML suspension 100 mg  Status:  Discontinued     100 mg Per Tube Daily 02/04/14 1759 02/13/14 1213   02/04/14 2000  piperacillin-tazobactam (ZOSYN) IVPB 2.25 g     2.25 g100 mL/hr over 30 Minutes Intravenous Every 8 hours 02/04/14 1613 02/04/14 2121   02/04/14 1100  ganciclovir  (CYTOVENE) 55 mg in sodium chloride 0.9 % 100 mL IVPB  Status:  Discontinued     1.25 mg/kg  45 kg100 mL/hr over 60 Minutes Intravenous Every 24 hours 02/04/14 1007 02/04/14 1450   02/04/14 1000  fluconazole (DIFLUCAN) tablet 400 mg  Status:  Discontinued     400 mg Per Tube Daily 02/03/14 0955 02/03/14 1015   02/04/14 1000  fluconazole (DIFLUCAN) 40 MG/ML suspension 400 mg  Status:  Discontinued     400 mg Per Tube Daily 02/03/14 1015 02/03/14 1136   02/04/14 1000  fluconazole (DIFLUCAN) 40 MG/ML suspension 200 mg  Status:  Discontinued     200 mg Per Tube Daily 02/03/14 1136 02/04/14 1759   02/04/14 1000  acyclovir (ZOVIRAX) 400 mg in dextrose 5 % 100 mL IVPB  Status:  Discontinued     10 mg/kg  40 kg108 mL/hr over 60 Minutes Intravenous Every 24 hours 02/03/14 1412 02/04/14 0952   02/04/14 0000  sulfamethoxazole-trimethoprim (BACTRIM) 200 mg of trimethoprim in dextrose 5 % 250 mL IVPB  Status:  Discontinued     5 mg/kg of trimethoprim  40 kg262.5 mL/hr over 60 Minutes Intravenous Every 12 hours 02/03/14 1412 02/05/14 1156   02/03/14 2000  piperacillin-tazobactam (ZOSYN) IVPB 2.25 g  Status:  Discontinued     2.25 g100 mL/hr over 30 Minutes Intravenous Every 8 hours 02/03/14 1412 02/04/14 1613   02/02/14 1400  sulfamethoxazole-trimethoprim (BACTRIM) 200 mg in dextrose 5 % 250 mL IVPB  Status:  Discontinued     15 mg/kg/day  40 kg262.5 mL/hr over 60 Minutes Intravenous 3 times per day 02/02/14 1354 02/03/14 1412   02/02/14 1300  fluconazole (DIFLUCAN) tablet 400 mg  Status:  Discontinued     400 mg Oral Daily 02/02/14 1026 02/03/14 0955   02/01/14 1700  sulfamethoxazole-trimethoprim (BACTRIM DS) 800-160 MG per tablet 1 tablet  Status:  Discontinued     1 tablet Oral Once per day on Mon Wed Fri 02/01/14 1549 02/02/14 1316   02/01/14 1600  azithromycin (ZITHROMAX) tablet 1,200 mg  Status:  Discontinued     1,200 mg Oral Weekly 02/01/14 1515 02/03/14 0955   02/01/14 1000  acyclovir (ZOVIRAX)  420 mg in dextrose 5 % 100 mL IVPB  Status:  Discontinued     10 mg/kg  41.9 kg108.4 mL/hr over 60 Minutes Intravenous Every 12 hours 02/01/14 0909 02/03/14 1412   02/01/14 1000  vancomycin (VANCOCIN) IVPB 750 mg/150 ml premix  Status:  Discontinued  750 mg150 mL/hr over 60 Minutes Intravenous Every 24 hours 02/01/14 0910 02/03/14 0910   02/01/14 0823  vancomycin (VANCOCIN) 500 mg in sodium chloride 0.9 % 100 mL IVPB  Status:  Discontinued     500 mg100 mL/hr over 60 Minutes Intravenous Every 24 hours 01/31/14 1029 02/01/14 0909   01/31/14 1200  piperacillin-tazobactam (ZOSYN) IVPB 3.375 g  Status:  Discontinued     3.375 g12.5 mL/hr over 240 Minutes Intravenous Every 8 hours 01/31/14 1029 02/03/14 1412   01/30/14 2100  acyclovir (ZOVIRAX) 390 mg in dextrose 5 % 100 mL IVPB  Status:  Discontinued     10 mg/kg  39.1 kg107.8 mL/hr over 60 Minutes Intravenous Every 24 hours 01/30/14 2018 02/01/14 0908   01/29/14 1600  acyclovir (ZOVIRAX) 420 mg in dextrose 5 % 100 mL IVPB  Status:  Discontinued     420 mg108.4 mL/hr over 60 Minutes Intravenous Every 24 hours 01/29/14 1457 01/29/14 2015   01/29/14 0800  cefTRIAXone (ROCEPHIN) 1 g in dextrose 5 % 50 mL IVPB  Status:  Discontinued     1 g100 mL/hr over 30 Minutes Intravenous Every 24 hours 01/29/14 0707 01/29/14 0710   01/29/14 0800  vancomycin (VANCOCIN) 500 mg in sodium chloride 0.9 % 100 mL IVPB  Status:  Discontinued     500 mg100 mL/hr over 60 Minutes Intravenous Every 48 hours 01/29/14 0719 01/31/14 1029   01/29/14 0730  piperacillin-tazobactam (ZOSYN) IVPB 2.25 g  Status:  Discontinued     2.25 g100 mL/hr over 30 Minutes Intravenous 4 times per day 01/29/14 0719 01/31/14 1028   01/29/14 0715  azithromycin (ZITHROMAX) 500 mg in dextrose 5 % 250 mL IVPB  Status:  Discontinued     500 mg250 mL/hr over 60 Minutes Intravenous Every 24 hours 01/29/14 0704 01/29/14 0710      Medications: Scheduled Meds: . abacavir  600 mg Per NG tube Daily    . acyclovir  10 mg/kg Intravenous Q24H  . amLODipine  5 mg Oral Daily  . aspirin  81 mg Oral Daily  . azithromycin  1,200 mg Oral Weekly  . clindamycin  600 mg Oral 3 times per day  . dolutegravir  50 mg Oral BID  . feeding supplement (ENSURE COMPLETE)  237 mL Oral BID BM  . feeding supplement (PRO-STAT SUGAR FREE 64)  30 mL Oral BID BM  . [START ON 02/20/2014] fluconazole  100 mg Per Tube Weekly  . heparin subcutaneous  5,000 Units Subcutaneous 3 times per day  . labetalol  200 mg Oral BID  . lamiVUDine  100 mg Oral Daily  . levETIRAcetam  500 mg Oral BID  . pantoprazole  40 mg Oral Daily  . [START ON 02/18/2014] predniSONE  20 mg Oral Q breakfast  . primaquine  30 mg Per Tube Q24H   Continuous Infusions:   PRN Meds:.    Objective: Weight change: -5 lb 4.7 oz (-2.4 kg)  Intake/Output Summary (Last 24 hours) at 02/17/14 1827 Last data filed at 02/17/14 1400  Gross per 24 hour  Intake    600 ml  Output   1550 ml  Net   -950 ml   Blood pressure 163/88, pulse 80, temperature 98.9 F (37.2 C), temperature source Oral, resp. rate 16, height $RemoveBe'5\' 7"'SEUbvsMwk$  (1.702 m), weight 123 lb 10.9 oz (56.1 kg), SpO2 99 %. Temp:  [97.9 F (36.6 C)-98.9 F (37.2 C)] 98.9 F (37.2 C) (11/11 1500) Pulse Rate:  [77-80] 80 (11/11 1500) Resp:  [  15-20] 16 (11/11 1500) BP: (156-179)/(82-90) 163/88 mmHg (11/11 1500) SpO2:  [98 %-99 %] 99 % (11/11 1500) Weight:  [123 lb 10.9 oz (56.1 kg)] 123 lb 10.9 oz (56.1 kg) (11/11 0529)  Physical Exam: General: aox ,answering questions HEENT: EOMI CVS tachycardical r,  no murmur rubs or gallops Chest: coarse breath sounds bilaterally Abdomen: softnondistended, normal bowel sounds, Skin: no rashes  Neuro: I WAS STRUCK WHEN TRYING TO POSITION HER WITH MY PHARMACY RESIDENT AND STUDENT HOW PROFOUNDLY WEAK SHE IS PARTICULARLY IN HER LOWER EXTREMITIES. SHE SEEMED UNABLE TO MOVE HER LEGS TO HELP WITH ASSIST TO MOVE UP IN BED at all. I witnessed very little LE motor  activity without maximum assistance  CBC:  CBC Latest Ref Rng 02/16/2014 02/15/2014 02/14/2014  WBC 4.0 - 10.5 K/uL 6.0 6.1 5.4  Hemoglobin 12.0 - 15.0 g/dL 8.1(L) 8.3(L) 8.9(L)  Hematocrit 36.0 - 46.0 % 25.3(L) 25.4(L) 27.1(L)  Platelets 150 - 400 K/uL 215 269 294     BMET  Recent Labs  02/16/14 0706 02/17/14 0544  NA 142 145  143  K 4.4 4.2  4.1  CL 105 109  107  CO2 _0 GLUCOSE 75 77  74  BUN 110* 99*  101*  CREATININE 3.16* 3.18*  3.14*  CALCIUM 8.7 8.4  8.5     Liver Panel   Recent Labs  02/16/14 0706 02/17/14 0544  ALBUMIN 1.8* 1.7*       Sedimentation Rate No results for input(s): ESRSEDRATE in the last 72 hours. C-Reactive Protein No results for input(s): CRP in the last 72 hours.  Micro Results: Recent Results (from the past 720 hour(s))  Urine culture     Status: None   Collection Time: 01/27/14  3:26 PM  Result Value Ref Range Status   Specimen Description URINE, CATHETERIZED  Final   Special Requests NONE  Final   Culture  Setup Time   Final    01/27/2014 22:22 Performed at [place] Performed at [facility]  Final   Culture NO GROWTH Performed at [facility]  Final   Report Status 01/28/2014 FINAL  Final  Culture, blood (routine x 2)     Status: None   Collection Time: 01/29/14  9:19 AM  Result Value Ref Range Status   Specimen Description BLOOD LEFT ARM  Final   Special Requests BOTTLES DRAWN AEROBIC AND ANAEROBIC 10CC  Final   Culture  Setup Time   Final    01/29/2014 15:04 Performed at [place]   Final    NO GROWTH 5 DAYS Performed at [facility]   Report Status 02/04/2014 FINAL  Final  Culture, blood (routine x 2)     Status: None   Collection Time: 01/29/14  9:30 AM  Result Value Ref Range Status   Specimen Description BLOOD LEFT HAND  Final   Special Requests BOTTLES DRAWN AEROBIC AND ANAEROBIC 5CC  Final   Culture  Setup  Time   Final    01/29/2014 15:04 Performed at [place]   Final    NO GROWTH 5 DAYS Performed at [facility]   Report Status 02/04/2014 FINAL  Final  Clostridium Difficile by PCR     Status: None   Collection Time: 01/29/14 11:10 AM  Result Value Ref Range Status   C difficile by pcr NEGATIVE NEGATIVE Final  MRSA PCR Screening  Status: Abnormal   Collection Time: 01/29/14  3:40 PM  Result Value Ref Range Status   MRSA by PCR POSITIVE (A) NEGATIVE Final    Comment:        The GeneXpert MRSA Assay (FDA approved for NASAL specimens only), is one component of a comprehensive MRSA colonization surveillance program. It is not intended to diagnose MRSA infection nor to guide or monitor treatment for MRSA infections. RESULT CALLED TO, READ BACK BY AND VERIFIED WITH: Judge Stall RN 17:50 01/29/14 ([email or web address])  AFB culture, blood     Status: None (Preliminary result)   Collection Time: 01/30/14 11:13 AM  Result Value Ref Range Status   Specimen Description BLOOD LEFT ARM  Final   Special Requests BOTTLES DRAWN AEROBIC ONLY 7CC  Final   Culture   Final    CULTURE WILL BE EXAMINED FOR 6 WEEKS BEFORE ISSUING A FINAL REPORT Performed at [facility]   Report Status PENDING  Incomplete  GC/Chlamydia Probe Amp     Status: None   Collection Time: 01/30/14 11:30 AM  Result Value Ref Range Status   CT Probe RNA NEGATIVE NEGATIVE Final   GC Probe RNA NEGATIVE NEGATIVE Final    Comment: (NOTE)                                                                                       **Normal Reference Range: Negative**      Assay performed using the Gen-Probe APTIMA COMBO2 (R) Assay. Acceptable specimen types for this assay include APTIMA Swabs (Unisex, endocervical, urethral, or vaginal), first void urine, and ThinPrep liquid based cytology samples. Performed at [facility]  CSF culture     Status: None   Collection Time: 01/30/14  5:01 PM    Result Value Ref Range Status   Specimen Description CSF  Final   Special Requests NONE  Final   Gram Stain   Final    CYTOSPIN SLIDE WBC PRESENT, PREDOMINANTLY MONONUCLEAR NO ORGANISMS SEEN Performed at [facility] Performed at [facility]   Culture   Final    NO GROWTH 3 DAYS Performed at [facility]   Report Status 02/03/2014 FINAL  Final  Gram stain     Status: None   Collection Time: 01/30/14  5:01 PM  Result Value Ref Range Status   Specimen Description CSF  Final   Special Requests NONE  Final   Gram Stain   Final    CYTOSPIN SLIDE WBC PRESENT, PREDOMINANTLY MONONUCLEAR NO ORGANISMS SEEN   Report Status 01/30/2014 FINAL  Final  Fungus culture, blood     Status: None   Collection Time: 02/01/14  2:00 PM  Result Value Ref Range Status   Specimen Description BLOOD LEFT HAND  Final   Special Requests BOTTLES DRAWN AEROBIC AND ANAEROBIC 10CC  Final   Culture   Final    NO GROWTH 7 DAYS Performed at [facility]    Report Status 02/09/2014 FINAL  Final  Culture, blood (routine x 2)     Status: None   Collection Time: 02/02/14 12:07 AM  Result Value Ref Range Status   Specimen  Description BLOOD RIGHT HAND  Final   Special Requests BOTTLES DRAWN AEROBIC ONLY [facility]  Final   Culture  Setup Time   Final    02/02/2014 03:55 Performed at [place]   Final    NO GROWTH 5 DAYS Performed at [facility]    Report Status 02/08/2014 FINAL  Final  Culture, blood (routine x 2)     Status: None   Collection Time: 02/02/14 12:18 AM  Result Value Ref Range Status   Specimen Description BLOOD LEFT HAND  Final   Special Requests BOTTLES DRAWN AEROBIC AND ANAEROBIC [facility] EACH  Final   Culture  Setup Time   Final    02/02/2014 03:55 Performed at [place]   Final    NO GROWTH 5 DAYS Performed at [facility]    Report Status 02/08/2014 FINAL  Final  Pneumocystis smear by DFA     Status:  None   Collection Time: 02/03/14  2:30 PM  Result Value Ref Range Status   Specimen Source-PJSRC SPUTUM  Final   Pneumocystis jiroveci Ag POSITIVE  Final    Comment: Performed at [place] RN 02/04/14 1444 BY [facility]  Culture, respiratory (NON-Expectorated)     Status: None   Collection Time: 02/04/14  3:50 AM  Result Value Ref Range Status   Specimen Description TRACHEAL ASPIRATE  Final   Special Requests Immunocompromised  Final   Gram Stain   Final    FEW WBC PRESENT,BOTH PMN AND MONONUCLEAR RARE SQUAMOUS EPITHELIAL CELLS PRESENT NO ORGANISMS SEEN Performed at [facility]   Culture   Final    NO GROWTH 2 DAYS Performed at [facility]   Report Status 02/06/2014 FINAL  Final  AFB culture with smear     Status: None (Preliminary result)   Collection Time: 02/04/14  3:50 AM  Result Value Ref Range Status   Specimen Description TRACHEAL ASPIRATE  Final   Special Requests NONE  Final   Acid Fast Smear   Final    1+ ACID FAST BACILLI SEEN CRITICAL RESULT CALLED TO, READ BACK BY AND VERIFIED WITH: CIARFI 14:55 110/30/15 FUENG CRITICAL RESULT CALLED TO, READ BACK BY AND VERIFIED WITH: LEE ANN STIMPSON H.D. 10:30 02/08/14 FUENG Performed at [facility]    Culture   Final    CULTURE WILL BE EXAMINED FOR 6 WEEKS BEFORE ISSUING A FINAL REPORT Performed at [facility]    Report Status PENDING  Incomplete  Fungus Culture with Smear     Status: None (Preliminary result)   Collection Time: 02/04/14  3:50 AM  Result Value Ref Range Status   Specimen Description TRACHEAL ASPIRATE  Final   Special Requests NONE  Final   Fungal Smear   Final    NO YEAST OR FUNGAL ELEMENTS SEEN Performed at [facility]    Culture   Final    CANDIDA ALBICANS Performed at [facility]    Report Status PENDING  Incomplete  Respiratory virus panel     Status: None   Collection Time: 02/04/14  3:50 AM  Result  Value Ref Range Status   Source - RVPAN TRACHEAL ASPIRATE  Final   Respiratory Syncytial Virus A NOT DETECTED  Final   Respiratory Syncytial Virus B NOT DETECTED  Final   Influenza A NOT DETECTED  Final   Influenza B NOT DETECTED  Final   Parainfluenza 1 NOT DETECTED  Final   Parainfluenza 2 NOT DETECTED  Final   Parainfluenza 3 NOT DETECTED  Final   Metapneumovirus NOT DETECTED  Final   Rhinovirus NOT DETECTED  Final   Adenovirus NOT DETECTED  Final   Influenza A H1 NOT DETECTED  Final   Influenza A H3 NOT DETECTED  Final    Comment: (NOTE)       Normal Reference Range for each Analyte: NOT DETECTED Testing performed using the Luminex xTAG Respiratory Viral Panel test kit. The analytical performance characteristics of this assay have been determined by [facility].  The modifications have not been cleared or approved by the FDA. This assay has been validated pursuant to the CLIA regulations and is used for clinical purposes. Performed at [facility]. Tuberculosis complex by PCR     Status: None   Collection Time: 02/04/14  3:50 AM  Result Value Ref Range Status   M. tuberculosis, Direct Not detected Not detected Final    Comment: (NOTE) This test(s) was developed and its performance characteristics  have been determined by [facility],  [place], [place]. Performance characteristics refer to the analytical  performance of the test. This test should not be used as a  substitute for culture. It should be used as an adjunct to  culture.    Source (MTBPCR) Tracheal Aspirate  Corrected    Comment: Performed at [place] 11/10 AT 0865: PREVIOUSLY REPORTED AS TRACHEAL ASPIRATE   Culture, bal-quantitative     Status: None   Collection Time: 02/04/14 10:31 AM  Result Value Ref Range Status   Specimen Description BRONCHIAL ALVEOLAR LAVAGE  Final   Special Requests Immunocompromised  Final   Gram Stain   Final    FEW  WBC PRESENT, PREDOMINANTLY MONONUCLEAR RARE SQUAMOUS EPITHELIAL CELLS PRESENT NO ORGANISMS SEEN Performed at [place] Performed at [facility]  Final   Culture   Final    NO GROWTH 2 DAYS Performed at [facility]   Report Status 02/06/2014 FINAL  Final  M. Tuberculosis complex by PCR     Status: None   Collection Time: 02/06/14  6:14 PM  Result Value Ref Range Status   M. tuberculosis, Direct Not detected Not detected Final    Comment: (NOTE) This test(s) was developed and its performance characteristics  have been determined by [facility],  [place], [place]. Performance characteristics refer to the analytical  performance of the test.    Source (MTBPCR) Sputum  Corrected    Comment: Performed at [place] 11/06 AT 0442: PREVIOUSLY REPORTED AS SPUTUM   AFB culture with smear     Status: None (Preliminary result)   Collection Time: 02/06/14  6:14 PM  Result Value Ref Range Status   Specimen Description SPUTUM  Final   Special Requests NONE  Final   Acid Fast Smear   Final    1+ ACID FAST BACILLI SEEN REFER TO PREVIOUS CULTURE [id number] FOR CALLS Performed at [facility]    Culture   Final    CULTURE WILL BE EXAMINED FOR 6 WEEKS BEFORE ISSUING A FINAL REPORT Performed at [facility]    Report Status PENDING  Incomplete  Culture, blood (routine x 2)     Status: None   Collection Time: 02/07/14  6:26 PM  Result Value Ref Range Status  Specimen Description BLOOD LEFT ARM  Final   Special Requests BOTTLES DRAWN AEROBIC AND ANAEROBIC 10CC  Final   Culture  Setup Time   Final    02/08/2014 02:25 Performed at [place]   Final    NO GROWTH 5 DAYS Performed at [facility]    Report Status 02/15/2014 FINAL  Final  Culture, blood (routine x 2)     Status: None   Collection Time: 02/07/14  6:32 PM  Result Value Ref Range Status    Specimen Description BLOOD BLOOD LEFT FOREARM  Final   Special Requests BOTTLES DRAWN AEROBIC ONLY 10CC  Final   Culture  Setup Time   Final    02/08/2014 02:25 Performed at [facility]    Culture   Final    NO GROWTH 5 DAYS Performed at [facility]    Report Status 02/15/2014 FINAL  Final  Clostridium Difficile by PCR     Status: None   Collection Time: 02/17/14  2:36 AM  Result Value Ref Range Status   C difficile by pcr NEGATIVE NEGATIVE Final    Studies/Results: No results found.    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 I  cannot explain focal weakness by HSV2 encephalitis.  --continue acyclovir and aim for 21 days total (20more days)  #3 PCP Pneumonia: continue clindamycin Primaquine and steroids for 21 day course She needs steroid tapered, and went to  $R'20mg'JQ$  yesterday  #4 Renal failure :started ARV, renal failure likely multifactorial. 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 This nuance of her belief system will need to be respected and we may need to engage her better to convince her that she can be cured of this "curse" whilst taking her ARV meds  I spent greater than 25 minutes with the patient including greater than 50% of time in face to face counsel of the patient and in coordination of their care.     LOS: 21 days   Alcide Evener 02/17/2014, 6:27 PM

## 2014-02-17 NOTE — Clinical Social Work Note (Signed)
CSW met with patient at bedside to discuss her plans for disposition. Patient does appear slightly confused at times, but for the most part she is able to engage with me appropriately. CSW explained to patient that PT has recommended SNF placement for the patient at discharge. Patient's family was agreeable to SNF placement at discharge so a SNF search was initiated. CSW explained this to patient. Patient states that this is not what she wants, and would like to return home at discharge to be with her family. Patient states that she wants to be home for the holidays and has plenty of family support at home. CSW shared that we were not successful in finding SNF placement for the patient in [place]. Patient does not want CSW to seek placement beyond [facility] and reiterates that she wants to return home at discharge with a [place] asked patient if it would be ok to speak with Elberta Fortis (her husband) to share her wishes and discuss the matter further. Patient agrees to this. CSW contacted Elberta Fortis to introduce self and to go over what has been explained to the patient. Elberta Fortis states that he just wants what is best for the patient. He does confirm that the patient does have strong family support and that she would have several family members willing to assist with caring for her at home. Elberta Fortis was not happy to hear that SNF placement would likely be outside of [facility], but states that he wants to discuss the situation more with the family and the patient to solidify what the plan will be at discharge. CSW will continue to follow to provide support and assist with disposition needs.    Liz Beach MSW, [place], [place], [phone]

## 2014-02-17 NOTE — Progress Notes (Signed)
Chaplain responded to spiritual care consult for advanced directive. Chaplain explained to pt what the advanced directive was. Pt expressed that she did not want to make any decisions now because she just took some medications. Pt seemed confused at some times and clear minded at others. Pt expressed that she feels very loved by her family and her oldest daughter, Osborne Cascoadia, knows her wishes and has been helpful in caring for Ms. Desiree Hale. PT expressed that her oldest son also knows her wishes. Chaplain and pt explored themes of loss (multiple people have passed in November in years past), love, and support. Chaplain asked pt to page a chaplain should she need one and/or if her children needed clarification on advanced directive questions. Page chaplain as needed.    02/17/14 0900  Clinical Encounter Type  Visited With Patient  Visit Type Follow-up;Spiritual support  Referral From Palliative care team  Consult/Referral To Chaplain  Recommendations Follow Up   Spiritual Encounters  Spiritual Needs Emotional;Prayer  Stress Factors  Patient Stress Factors Major life changes  Advance Directives (For Healthcare)  Does patient have an advance directive? No  Would patient like information on creating an advanced directive? Yes - Spiritual care consult ordered (Chaplain responded to consult. Pt did not want one at this t)  Kolsen Choe, Mayer MaskerCourtney F, Chaplain 02/17/2014 9:57 AM

## 2014-02-18 ENCOUNTER — Inpatient Hospital Stay (HOSPITAL_COMMUNITY): Payer: Medicaid Other

## 2014-02-18 DIAGNOSIS — F39 Unspecified mood [affective] disorder: Secondary | ICD-10-CM

## 2014-02-18 DIAGNOSIS — G969 Disorder of central nervous system, unspecified: Secondary | ICD-10-CM

## 2014-02-18 DIAGNOSIS — R0602 Shortness of breath: Secondary | ICD-10-CM | POA: Insufficient documentation

## 2014-02-18 DIAGNOSIS — B20 Human immunodeficiency virus [HIV] disease: Secondary | ICD-10-CM | POA: Insufficient documentation

## 2014-02-18 LAB — VITAMIN B12: Vitamin B-12: 780 pg/mL (ref 211–911)

## 2014-02-18 LAB — CBC
HEMATOCRIT: 22.9 % — AB (ref 36.0–46.0)
Hemoglobin: 7.5 g/dL — ABNORMAL LOW (ref 12.0–15.0)
MCH: 26 pg (ref 26.0–34.0)
MCHC: 32.8 g/dL (ref 30.0–36.0)
MCV: 79.5 fL (ref 78.0–100.0)
PLATELETS: 181 10*3/uL (ref 150–400)
RBC: 2.88 MIL/uL — AB (ref 3.87–5.11)
RDW: 17 % — AB (ref 11.5–15.5)
WBC: 5.5 10*3/uL (ref 4.0–10.5)

## 2014-02-18 LAB — COMPREHENSIVE METABOLIC PANEL
ALBUMIN: 1.9 g/dL — AB (ref 3.5–5.2)
ALT: 36 U/L — ABNORMAL HIGH (ref 0–35)
AST: 20 U/L (ref 0–37)
Alkaline Phosphatase: 62 U/L (ref 39–117)
Anion gap: 14 (ref 5–15)
BUN: 96 mg/dL — ABNORMAL HIGH (ref 6–23)
CALCIUM: 8.4 mg/dL (ref 8.4–10.5)
CHLORIDE: 107 meq/L (ref 96–112)
CO2: 23 mEq/L (ref 19–32)
CREATININE: 2.92 mg/dL — AB (ref 0.50–1.10)
GFR calc Af Amer: 21 mL/min — ABNORMAL LOW (ref >90)
GFR, EST NON AFRICAN AMERICAN: 18 mL/min — AB (ref >90)
Glucose, Bld: 75 mg/dL (ref 70–99)
Potassium: 3.9 mEq/L (ref 3.7–5.3)
Sodium: 144 mEq/L (ref 137–147)
Total Bilirubin: 0.4 mg/dL (ref 0.3–1.2)
Total Protein: 5.1 g/dL — ABNORMAL LOW (ref 6.0–8.3)

## 2014-02-18 LAB — GLUCOSE, CAPILLARY
GLUCOSE-CAPILLARY: 77 mg/dL (ref 70–99)
GLUCOSE-CAPILLARY: 107 mg/dL — AB (ref 70–99)
GLUCOSE-CAPILLARY: 87 mg/dL (ref 70–99)
Glucose-Capillary: 90 mg/dL (ref 70–99)
Glucose-Capillary: 146 mg/dL — ABNORMAL HIGH (ref 70–99)

## 2014-02-18 LAB — CK: CK TOTAL: 49 U/L (ref 7–177)

## 2014-02-18 MED ORDER — HYDRALAZINE HCL 20 MG/ML IJ SOLN
5.0000 mg | Freq: Once | INTRAMUSCULAR | Status: AC
  Administered 2014-02-18: 5 mg via INTRAVENOUS
  Filled 2014-02-18: qty 1

## 2014-02-18 MED ORDER — POLYSACCHARIDE IRON COMPLEX 150 MG PO CAPS
150.0000 mg | ORAL_CAPSULE | Freq: Every day | ORAL | Status: DC
  Administered 2014-02-18 – 2014-02-23 (×6): 150 mg via ORAL
  Filled 2014-02-18 (×6): qty 1

## 2014-02-18 NOTE — Consult Note (Signed)
WOC wound consult note Pt is very flat, does not answer most of questions when I ask her about her bottom.  She only opens her eyes briefly. She is incontinent of bowel/bladder  Reason for Consult: sacrum/ buttocks Wound type: Stage II Pressure ulcer sacrum with some associated MASD (moisture associated skin damage) over the buttocks.   Pressure Ulcer POA: Yes Measurement: 3cm x 2.0cm x 0.2cm  With some distal partial thickness skin loss on the left buttock that is aprox 1.0cm x 1.5cm  Wound bed: both area are clean, pink, moist Drainage (amount, consistency, odor) minimal, serous  Periwound:intact Dressing procedure/placement/frequency: Silicone foam can be used however now that [facility] has been removed if patient is wet with urine or stool will need to evaluate the dressing use if it is being soiled frequently may need to dc the dressing and use zinc based barrier cream. Pt is able to turn from side to side therefore at this time I will not add a low air loss mattress. Pt has Prevalon boots in the room for offloading the heels.  Will add pressure redistribution chair pad to protect the area when she is up in the chair.   Discussed POC with patient and bedside nurse.  Re consult if needed, will not follow at this time. Thanks  Tayona Sarnowski [employer] RN, CWOCN [phone])

## 2014-02-18 NOTE — Consult Note (Signed)
[facility] Face-to-Face Psychiatry Consult   Reason for Consult:  Capacity evaluation Referring Physician:  Dr. Carles Collet Desiree Hale is an 43 y.o. female. Total Time spent with patient: 45 minutes  Assessment: AXIS I:  Mood Disorder NOS AXIS II:  Deferred AXIS III:   Past Medical History  Diagnosis Date  . Hypertension   . HIV (human immunodeficiency virus infection)   . Herpes zoster   . Headache   . Diabetes mellitus    AXIS IV:  economic problems, occupational problems, other psychosocial or environmental problems, problems related to social environment, problems with access to health care services and problems with primary support group AXIS V:  51-60 moderate symptoms  Plan:  Patient has capacity to make her own medical decision and living arrangement based on my evaluation today  Subjective:   Desiree Hale is a 43 y.o. female patient admitted with AMS.  HPI:  Patient seen and chart reviewed for psychiatric consultation and evaluation for capacity. Patient is awake, alert, oriented x 4. She has intact concentration, immediate and delayed  memory and language with fund of knowledge. She has been aware of her medical problems and treatment needs but she is not able to name the medication she has been taking her seizures. Patient denied suicidal or homicidal ideation, intention or plans and has no evidence of psychosis. She has no previous psych treatments as reported.   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 She was initially admitted with acute encephalopathy on the hospitalist service, then decompensated from a respiratory standpoint and was intubated 10/28. Patient's respiratory status has gradually improved and was able to be extubated on 11/5, and currently she is comfortable on room air. Her course was complicated by acute renal failure thought to be multifactorial due to nephrotoxic  agents, ATN. Her acute encephalopathy has been very slow to improve and she has intermittent periods of confusion alternating with alertness. On 11/10 her cognitive ability was significantly improved. Infectious disease, nephrology, PCCM and as well as palliative care have been involved in her care.    Past Psychiatric History: Past Medical History  Diagnosis Date  . Hypertension   . HIV (human immunodeficiency virus infection)   . Herpes zoster   . Headache   . Diabetes mellitus     reports that she has been smoking Cigarettes.  She has been smoking about 0.50 packs per day. She does not have any smokeless tobacco history on file. Her alcohol and drug histories are not on file. No family history on file.   Living Arrangements: Other relatives   Abuse/Neglect [facility]) Physical Abuse: Denies Verbal Abuse: Denies Sexual Abuse: Denies Allergies:   Allergies  Allergen Reactions  . Ace Inhibitors Anaphylaxis  . Oxycodone Other (See Comments)    ACT Assessment Complete:  NO Objective: Blood pressure 153/73, pulse 91, temperature 98.2 F (36.8 C), temperature source Oral, resp. rate 16, height _0  (1.702 m), weight 56.1 kg (123 lb 10.9 oz), SpO2 99 %.Body mass index is 19.37 kg/(m^2). Results for orders placed or performed during the hospital encounter of 01/27/14 (from the past 72 hour(s))  Glucose, capillary     Status: Abnormal   Collection Time: 02/15/14  4:37 PM  Result Value Ref Range   Glucose-Capillary 136 (H) 70 - 99 mg/dL  Glucose, capillary     Status: Abnormal   Collection Time: 02/15/14  8:35 PM  Result Value Ref  Range   Glucose-Capillary 112 (H) 70 - 99 mg/dL  Glucose, capillary     Status: Abnormal   Collection Time: 02/16/14 12:23 AM  Result Value Ref Range   Glucose-Capillary 108 (H) 70 - 99 mg/dL  Glucose, capillary     Status: None   Collection Time: 02/16/14  4:17 AM  Result Value Ref Range   Glucose-Capillary 84 70 - 99 mg/dL  Renal function panel      Status: Abnormal   Collection Time: 02/16/14  7:06 AM  Result Value Ref Range   Sodium 142 137 - 147 mEq/L   Potassium 4.4 3.7 - 5.3 mEq/L   Chloride 105 96 - 112 mEq/L   CO2 21 19 - 32 mEq/L   Glucose, Bld 75 70 - 99 mg/dL   BUN 110 (H) 6 - 23 mg/dL   Creatinine, Ser 3.16 (H) 0.50 - 1.10 mg/dL   Calcium 8.7 8.4 - 10.5 mg/dL   Phosphorus 8.3 (H) 2.3 - 4.6 mg/dL   Albumin 1.8 (L) 3.5 - 5.2 g/dL   GFR calc non Af Amer 17 (L) >90 mL/min   GFR calc Af Amer 19 (L) >90 mL/min    Comment: (NOTE) The eGFR has been calculated using the CKD EPI equation. This calculation has not been validated in all clinical situations. eGFR's persistently <90 mL/min signify possible Chronic Kidney Disease.    Anion gap 16 (H) 5 - 15  CBC     Status: Abnormal   Collection Time: 02/16/14  7:06 AM  Result Value Ref Range   WBC 6.0 4.0 - 10.5 K/uL   RBC 3.10 (L) 3.87 - 5.11 MIL/uL   Hemoglobin 8.1 (L) 12.0 - 15.0 g/dL   HCT 25.3 (L) 36.0 - 46.0 %   MCV 81.6 78.0 - 100.0 fL   MCH 26.1 26.0 - 34.0 pg   MCHC 32.0 30.0 - 36.0 g/dL   RDW 16.9 (H) 11.5 - 15.5 %   Platelets 215 150 - 400 K/uL  Glucose, capillary     Status: None   Collection Time: 02/16/14  7:57 AM  Result Value Ref Range   Glucose-Capillary 84 70 - 99 mg/dL  Glucose, capillary     Status: None   Collection Time: 02/16/14 12:13 PM  Result Value Ref Range   Glucose-Capillary 99 70 - 99 mg/dL  Glucose, capillary     Status: Abnormal   Collection Time: 02/16/14  6:13 PM  Result Value Ref Range   Glucose-Capillary 111 (H) 70 - 99 mg/dL  Glucose, capillary     Status: None   Collection Time: 02/16/14  8:30 PM  Result Value Ref Range   Glucose-Capillary 94 70 - 99 mg/dL  Glucose, capillary     Status: None   Collection Time: 02/16/14 11:54 PM  Result Value Ref Range   Glucose-Capillary 78 70 - 99 mg/dL   Comment 1 Documented in Chart    Comment 2 Notify RN   Clostridium Difficile by PCR     Status: None   Collection Time: 02/17/14   2:36 AM  Result Value Ref Range   C difficile by pcr NEGATIVE NEGATIVE  Glucose, capillary     Status: None   Collection Time: 02/17/14  4:27 AM  Result Value Ref Range   Glucose-Capillary 77 70 - 99 mg/dL  Renal function panel     Status: Abnormal   Collection Time: 02/17/14  5:44 AM  Result Value Ref Range   Sodium 145 137 - 147 mEq/L  Potassium 4.2 3.7 - 5.3 mEq/L   Chloride 109 96 - 112 mEq/L   CO2 21 19 - 32 mEq/L   Glucose, Bld 77 70 - 99 mg/dL   BUN 99 (H) 6 - 23 mg/dL   Creatinine, Ser 3.18 (H) 0.50 - 1.10 mg/dL   Calcium 8.4 8.4 - 10.5 mg/dL   Phosphorus 7.1 (H) 2.3 - 4.6 mg/dL   Albumin 1.7 (L) 3.5 - 5.2 g/dL   GFR calc non Af Amer 17 (L) >90 mL/min   GFR calc Af Amer 19 (L) >90 mL/min    Comment: (NOTE) The eGFR has been calculated using the CKD EPI equation. This calculation has not been validated in all clinical situations. eGFR's persistently <90 mL/min signify possible Chronic Kidney Disease.    Anion gap 15 5 - 15  Basic metabolic panel     Status: Abnormal   Collection Time: 02/17/14  5:44 AM  Result Value Ref Range   Sodium 143 137 - 147 mEq/L   Potassium 4.1 3.7 - 5.3 mEq/L   Chloride 107 96 - 112 mEq/L   CO2 21 19 - 32 mEq/L   Glucose, Bld 74 70 - 99 mg/dL   BUN 101 (H) 6 - 23 mg/dL   Creatinine, Ser 3.14 (H) 0.50 - 1.10 mg/dL   Calcium 8.5 8.4 - 10.5 mg/dL   GFR calc non Af Amer 17 (L) >90 mL/min   GFR calc Af Amer 20 (L) >90 mL/min    Comment: (NOTE) The eGFR has been calculated using the CKD EPI equation. This calculation has not been validated in all clinical situations. eGFR's persistently <90 mL/min signify possible Chronic Kidney Disease.    Anion gap 15 5 - 15  Glucose, capillary     Status: None   Collection Time: 02/17/14  8:08 AM  Result Value Ref Range   Glucose-Capillary 86 70 - 99 mg/dL   Comment 1 Notify RN   Glucose, capillary     Status: Abnormal   Collection Time: 02/17/14 12:46 PM  Result Value Ref Range    Glucose-Capillary 149 (H) 70 - 99 mg/dL  Glucose, capillary     Status: Abnormal   Collection Time: 02/17/14  5:54 PM  Result Value Ref Range   Glucose-Capillary 112 (H) 70 - 99 mg/dL  Glucose, capillary     Status: Abnormal   Collection Time: 02/17/14  8:10 PM  Result Value Ref Range   Glucose-Capillary 137 (H) 70 - 99 mg/dL  Glucose, capillary     Status: Abnormal   Collection Time: 02/17/14 11:54 PM  Result Value Ref Range   Glucose-Capillary 127 (H) 70 - 99 mg/dL  Comprehensive metabolic panel     Status: Abnormal   Collection Time: 02/18/14  3:58 AM  Result Value Ref Range   Sodium 144 137 - 147 mEq/L   Potassium 3.9 3.7 - 5.3 mEq/L   Chloride 107 96 - 112 mEq/L   CO2 23 19 - 32 mEq/L   Glucose, Bld 75 70 - 99 mg/dL   BUN 96 (H) 6 - 23 mg/dL   Creatinine, Ser 2.92 (H) 0.50 - 1.10 mg/dL   Calcium 8.4 8.4 - 10.5 mg/dL   Total Protein 5.1 (L) 6.0 - 8.3 g/dL   Albumin 1.9 (L) 3.5 - 5.2 g/dL   AST 20 0 - 37 U/L   ALT 36 (H) 0 - 35 U/L   Alkaline Phosphatase 62 39 - 117 U/L   Total Bilirubin 0.4 0.3 - 1.2 mg/dL  GFR calc non Af Amer 18 (L) >90 mL/min   GFR calc Af Amer 21 (L) >90 mL/min    Comment: (NOTE) The eGFR has been calculated using the CKD EPI equation. This calculation has not been validated in all clinical situations. eGFR's persistently <90 mL/min signify possible Chronic Kidney Disease.    Anion gap 14 5 - 15  CBC     Status: Abnormal   Collection Time: 02/18/14  3:58 AM  Result Value Ref Range   WBC 5.5 4.0 - 10.5 K/uL   RBC 2.88 (L) 3.87 - 5.11 MIL/uL   Hemoglobin 7.5 (L) 12.0 - 15.0 g/dL   HCT 22.9 (L) 36.0 - 46.0 %   MCV 79.5 78.0 - 100.0 fL   MCH 26.0 26.0 - 34.0 pg   MCHC 32.8 30.0 - 36.0 g/dL   RDW 17.0 (H) 11.5 - 15.5 %   Platelets 181 150 - 400 K/uL  Glucose, capillary     Status: None   Collection Time: 02/18/14  4:17 AM  Result Value Ref Range   Glucose-Capillary 77 70 - 99 mg/dL   Comment 1 Documented in Chart    Comment 2 Notify RN    Glucose, capillary     Status: None   Collection Time: 02/18/14  8:05 AM  Result Value Ref Range   Glucose-Capillary 90 70 - 99 mg/dL   Comment 1 Documented in Chart    Comment 2 Notify RN   CK     Status: None   Collection Time: 02/18/14  8:55 AM  Result Value Ref Range   Total CK 49 7 - 177 U/L  Glucose, capillary     Status: Abnormal   Collection Time: 02/18/14 12:22 PM  Result Value Ref Range   Glucose-Capillary 107 (H) 70 - 99 mg/dL   Labs are reviewed .  Current Facility-Administered Medications  Medication Dose Route Frequency Provider Last Rate Last Dose  . abacavir (ZIAGEN) tablet 600 mg  600 mg Per NG tube Daily Truman Hayward, MD   600 mg at 02/18/14 1123  . acyclovir (ZOVIRAX) 470 mg in dextrose 5 % 100 mL IVPB  10 mg/kg Intravenous Q24H Wilhelmina Mcardle, MD   470 mg at 02/18/14 1152  . amLODipine (NORVASC) tablet 5 mg  5 mg Oral Daily Melton Alar, PA-C   5 mg at 02/18/14 1124  . aspirin chewable tablet 81 mg  81 mg Oral Daily Ripudeep Krystal Eaton, MD   81 mg at 02/18/14 1123  . azithromycin (ZITHROMAX) tablet 1,200 mg  1,200 mg Oral Weekly Truman Hayward, MD   1,200 mg at 02/16/14 2336  . clindamycin (CLEOCIN) capsule 600 mg  600 mg Oral 3 times per day Truman Hayward, MD   600 mg at 02/18/14 0848  . dolutegravir (TIVICAY) tablet 50 mg  50 mg Oral BID Truman Hayward, MD   50 mg at 02/18/14 1123  . feeding supplement (ENSURE COMPLETE) (ENSURE COMPLETE) liquid 237 mL  237 mL Oral BID BM Jenifer A Williams, RD   237 mL at 02/18/14 1125  . feeding supplement (PRO-STAT SUGAR FREE 64) liquid 30 mL  30 mL Oral BID BM Jenifer A Williams, RD   30 mL at 02/18/14 1125  . [START ON 02/20/2014] fluconazole (DIFLUCAN) 40 MG/ML suspension 100 mg  100 mg Per Tube Weekly Michel Bickers, MD      . heparin injection 5,000 Units  5,000 Units Subcutaneous 3 times per  day Rigoberto Noel, MD   5,000 Units at 02/18/14 [phone]  . iron polysaccharides (NIFEREX) capsule 150 mg  150 mg  Oral Daily Melton Alar, PA-C   150 mg at 02/18/14 1124  . labetalol (NORMODYNE) tablet 200 mg  200 mg Oral BID Kara Mead V, MD   200 mg at 02/18/14 1123  . lamiVUDine (EPIVIR) 10 MG/ML solution 100 mg  100 mg Oral Daily Truman Hayward, MD   100 mg at 02/18/14 1124  . levETIRAcetam (KEPPRA) tablet 500 mg  500 mg Oral BID Jake Church Masters, RPH   500 mg at 02/18/14 1250  . pantoprazole (PROTONIX) EC tablet 40 mg  40 mg Oral Daily Louis Meckel, MD   40 mg at 02/18/14 1124  . predniSONE (DELTASONE) tablet 20 mg  20 mg Oral Q breakfast Melton Alar, PA-C   20 mg at 02/18/14 0848  . primaquine tablet 30 mg  30 mg Per Tube Q24H Juanda Chance [place], RPH   30 mg at 02/17/14 2349    Psychiatric Specialty Exam: Physical Exam as per history and physical  Review of Systems  Constitutional: Positive for weight loss and malaise/fatigue.  Musculoskeletal: Positive for myalgias.  Neurological: Positive for dizziness, seizures and weakness.    Blood pressure 153/73, pulse 91, temperature 98.2 F (36.8 C), temperature source Oral, resp. rate 16, height _0  (1.702 m), weight 56.1 kg (123 lb 10.9 oz), SpO2 99 %.Body mass index is 19.37 kg/(m^2).  General Appearance: weak and tired appearing  Eye Contact::  Good  Speech:  Clear and Coherent and Slow  Volume:  Decreased  Mood:  Anxious  Affect:  Constricted and Depressed  Thought Process:  Coherent and Goal Directed  Orientation:  Full (Time, Place, and Person)  Thought Content:  WDL  Suicidal Thoughts:  No  Homicidal Thoughts:  No  Memory:  Immediate;   Fair Recent;   Fair  Judgement:  Fair  Insight:  Fair  Psychomotor Activity:  Decreased  Concentration:  Fair  Recall:  Good  Fund of Knowledge:Good  Language: Good  Akathisia:  NA  Handed:  Right  AIMS (if indicated):     Assets:  Communication Skills Desire for Improvement Housing Leisure Time Social Support  Sleep:      Musculoskeletal: Strength & Muscle Tone:  decreased Gait & Station: unable to stand Patient leans: N/A  Treatment Plan Summary: Daily contact with patient to assess and evaluate symptoms and progress in treatment Medication management  Patient has capacity to make her own medical decisions and living arrangements.  Desiree Hale,JANARDHAHA R. 02/18/2014 1:56 PM

## 2014-02-18 NOTE — Progress Notes (Signed)
[facility]    Day 20 Acyclovir DAy 15  pcp rx  IV clinda/primaquin )  On weekly prophlyactic azithromycin now and prophylactic fluconazole  10 days emb/rifabutin/azithro  Day 14 fluconazole  Zosyn D/cd after 7 days    Subjective: Wanted to sit up in bed more and I lifted her up in bed   Antibiotics:  Anti-infectives    Start     Dose/Rate Route Frequency Ordered Stop   02/20/14 1000  fluconazole (DIFLUCAN) 40 MG/ML suspension 100 mg     100 mg Per Tube Weekly 02/13/14 1213     02/17/14 0000  azithromycin (ZITHROMAX) tablet 1,200 mg     1,200 mg Oral Weekly 02/15/14 1309     02/15/14 1400  clindamycin (CLEOCIN) capsule 600 mg     600 mg Oral 3 times per day 02/15/14 1311     02/13/14 1300  azithromycin (ZITHROMAX) tablet 500 mg  Status:  Discontinued     500 mg Oral Daily 02/13/14 1206 02/15/14 1240   02/09/14 1000  lamiVUDine (EPIVIR) 10 MG/ML solution 100 mg     100 mg Oral Daily 02/08/14 1149     02/08/14 1600  azithromycin (ZITHROMAX) tablet 1,200 mg  Status:  Discontinued     1,200 mg Per Tube Weekly 02/03/14 0955 02/03/14 1012   02/08/14 1300  abacavir (ZIAGEN) tablet 600 mg     600 mg Per NG tube Daily 02/08/14 1115     02/08/14 1200  lamiVUDine (EPIVIR) 10 MG/ML solution 150 mg     150 mg Per Tube  Once 02/08/14 1115 02/08/14 1320   02/08/14 1200  dolutegravir (TIVICAY) tablet 50 mg    Comments:  Per NG TUBE   50 mg Oral 2 times daily 02/08/14 1115     02/08/14 1000  azithromycin (ZITHROMAX) 200 MG/5ML suspension 1,200 mg  Status:  Discontinued     1,200 mg Oral Weekly 02/03/14 1013 02/03/14 1015   02/08/14 1000  azithromycin (ZITHROMAX) 200 MG/5ML suspension 1,200 mg  Status:  Discontinued     1,200 mg Per Tube Weekly 02/03/14 1015 02/05/14 1542   02/06/14 1200  acyclovir (ZOVIRAX) 470 mg in dextrose 5 % 100 mL  IVPB     10 mg/kg  47 kg109.4 mL/hr over 60 Minutes Intravenous Every 24 hours 02/06/14 1110     02/05/14 2200  clindamycin (CLEOCIN) IVPB 600 mg  Status:  Discontinued     600 mg100 mL/hr over 30 Minutes Intravenous 3 times per day 02/05/14 1542 02/15/14 1311   02/05/14 2200  primaquine tablet 30 mg     30 mg Per Tube Every 24 hours 02/05/14 1542     02/05/14 1700  azithromycin (ZITHROMAX) 200 MG/5ML suspension 600 mg  Status:  Discontinued     600 mg Per Tube Daily 02/05/14 1542 02/13/14 1206   02/05/14 1700  ethambutol (MYAMBUTOL) tablet 800 mg  Status:  Discontinued     800 mg Oral Every 24 hours 02/05/14 1542 02/05/14 1545   02/05/14 1700  ethambutol (MYAMBUTOL) tablet 800 mg  Status:  Discontinued     800 mg Per Tube Every 24 hours 02/05/14 1545 02/15/14 1240   02/05/14 1600  rifabutin (MYCOBUTIN) capsule 150 mg  Status:  Discontinued     150 mg Oral Daily 02/05/14 1542 02/15/14 1240   02/05/14 1200  acyclovir (ZOVIRAX) 450 mg in dextrose 5 % 100 mL IVPB  Status:  Discontinued     10 mg/kg  45 kg109 mL/hr over 60 Minutes Intravenous Every 24 hours 02/04/14 1554 02/04/14 1601   02/05/14 1200  acyclovir (ZOVIRAX) 400 mg in dextrose 5 % 100 mL IVPB  Status:  Discontinued     10 mg/kg  40 kg (Order-Specific)108 mL/hr over 60 Minutes Intravenous Every 24 hours 02/04/14 1601 02/06/14 1110   02/05/14 1200  sulfamethoxazole-trimethoprim (BACTRIM) 240 mg of trimethoprim in dextrose 5 % 250 mL IVPB  Status:  Discontinued     240 mg of trimethoprim265 mL/hr over 60 Minutes Intravenous Every 12 hours 02/05/14 1156 02/05/14 1510   02/05/14 1000  fluconazole (DIFLUCAN) 40 MG/ML suspension 100 mg  Status:  Discontinued     100 mg Per Tube Daily 02/04/14 1759 02/13/14 1213   02/04/14 2000  piperacillin-tazobactam (ZOSYN) IVPB 2.25 g     2.25 g100 mL/hr over 30 Minutes Intravenous Every 8 hours 02/04/14 1613 02/04/14 2121   02/04/14 1100  ganciclovir (CYTOVENE) 55 mg in sodium chloride 0.9 % 100 mL  IVPB  Status:  Discontinued     1.25 mg/kg  45 kg100 mL/hr over 60 Minutes Intravenous Every 24 hours 02/04/14 1007 02/04/14 1450   02/04/14 1000  fluconazole (DIFLUCAN) tablet 400 mg  Status:  Discontinued     400 mg Per Tube Daily 02/03/14 0955 02/03/14 1015   02/04/14 1000  fluconazole (DIFLUCAN) 40 MG/ML suspension 400 mg  Status:  Discontinued     400 mg Per Tube Daily 02/03/14 1015 02/03/14 1136   02/04/14 1000  fluconazole (DIFLUCAN) 40 MG/ML suspension 200 mg  Status:  Discontinued     200 mg Per Tube Daily 02/03/14 1136 02/04/14 1759   02/04/14 1000  acyclovir (ZOVIRAX) 400 mg in dextrose 5 % 100 mL IVPB  Status:  Discontinued     10 mg/kg  40 kg108 mL/hr over 60 Minutes Intravenous Every 24 hours 02/03/14 1412 02/04/14 0952   02/04/14 0000  sulfamethoxazole-trimethoprim (BACTRIM) 200 mg of trimethoprim in dextrose 5 % 250 mL IVPB  Status:  Discontinued     5 mg/kg of trimethoprim  40 kg262.5 mL/hr over 60 Minutes Intravenous Every 12 hours 02/03/14 1412 02/05/14 1156   02/03/14 2000  piperacillin-tazobactam (ZOSYN) IVPB 2.25 g  Status:  Discontinued     2.25 g100 mL/hr over 30 Minutes Intravenous Every 8 hours 02/03/14 1412 02/04/14 1613   02/02/14 1400  sulfamethoxazole-trimethoprim (BACTRIM) 200 mg in dextrose 5 % 250 mL IVPB  Status:  Discontinued     15 mg/kg/day  40 kg262.5 mL/hr over 60 Minutes Intravenous 3 times per day 02/02/14 1354 02/03/14 1412   02/02/14 1300  fluconazole (DIFLUCAN) tablet 400 mg  Status:  Discontinued     400 mg Oral Daily 02/02/14 1026 02/03/14 0955   02/01/14 1700  sulfamethoxazole-trimethoprim (BACTRIM DS) 800-160 MG per tablet 1 tablet  Status:  Discontinued     1 tablet Oral Once per day on Mon Wed Fri 02/01/14 1549 02/02/14 1316   02/01/14 1600  azithromycin (ZITHROMAX) tablet 1,200 mg  Status:  Discontinued     1,200 mg Oral Weekly 02/01/14 1515 02/03/14 0955   02/01/14 1000  acyclovir (ZOVIRAX) 420 mg in dextrose 5 % 100 mL IVPB  Status:   Discontinued     10 mg/kg  41.9 kg108.4 mL/hr over 60 Minutes Intravenous Every 12 hours 02/01/14 0909 02/03/14 1412   02/01/14 1000  vancomycin (VANCOCIN) IVPB 750 mg/150 ml premix  Status:  Discontinued     750 mg150 mL/hr over 60 Minutes Intravenous  Every 24 hours 02/01/14 0910 02/03/14 0910   02/01/14 0823  vancomycin (VANCOCIN) 500 mg in sodium chloride 0.9 % 100 mL IVPB  Status:  Discontinued     500 mg100 mL/hr over 60 Minutes Intravenous Every 24 hours 01/31/14 1029 02/01/14 0909   01/31/14 1200  piperacillin-tazobactam (ZOSYN) IVPB 3.375 g  Status:  Discontinued     3.375 g12.5 mL/hr over 240 Minutes Intravenous Every 8 hours 01/31/14 1029 02/03/14 1412   01/30/14 2100  acyclovir (ZOVIRAX) 390 mg in dextrose 5 % 100 mL IVPB  Status:  Discontinued     10 mg/kg  39.1 kg107.8 mL/hr over 60 Minutes Intravenous Every 24 hours 01/30/14 2018 02/01/14 0908   01/29/14 1600  acyclovir (ZOVIRAX) 420 mg in dextrose 5 % 100 mL IVPB  Status:  Discontinued     420 mg108.4 mL/hr over 60 Minutes Intravenous Every 24 hours 01/29/14 1457 01/29/14 2015   01/29/14 0800  cefTRIAXone (ROCEPHIN) 1 g in dextrose 5 % 50 mL IVPB  Status:  Discontinued     1 g100 mL/hr over 30 Minutes Intravenous Every 24 hours 01/29/14 0707 01/29/14 0710   01/29/14 0800  vancomycin (VANCOCIN) 500 mg in sodium chloride 0.9 % 100 mL IVPB  Status:  Discontinued     500 mg100 mL/hr over 60 Minutes Intravenous Every 48 hours 01/29/14 0719 01/31/14 1029   01/29/14 0730  piperacillin-tazobactam (ZOSYN) IVPB 2.25 g  Status:  Discontinued     2.25 g100 mL/hr over 30 Minutes Intravenous 4 times per day 01/29/14 0719 01/31/14 1028   01/29/14 0715  azithromycin (ZITHROMAX) 500 mg in dextrose 5 % 250 mL IVPB  Status:  Discontinued     500 mg250 mL/hr over 60 Minutes Intravenous Every 24 hours 01/29/14 0704 01/29/14 0710      Medications: Scheduled Meds: . abacavir  600 mg Per NG tube Daily  . acyclovir  10 mg/kg Intravenous Q24H  .  amLODipine  5 mg Oral Daily  . aspirin  81 mg Oral Daily  . azithromycin  1,200 mg Oral Weekly  . clindamycin  600 mg Oral 3 times per day  . dolutegravir  50 mg Oral BID  . feeding supplement (ENSURE COMPLETE)  237 mL Oral BID BM  . feeding supplement (PRO-STAT SUGAR FREE 64)  30 mL Oral BID BM  . [START ON 02/20/2014] fluconazole  100 mg Per Tube Weekly  . heparin subcutaneous  5,000 Units Subcutaneous 3 times per day  . iron polysaccharides  150 mg Oral Daily  . labetalol  200 mg Oral BID  . lamiVUDine  100 mg Oral Daily  . levETIRAcetam  500 mg Oral BID  . pantoprazole  40 mg Oral Daily  . predniSONE  20 mg Oral Q breakfast  . primaquine  30 mg Per Tube Q24H   Continuous Infusions:   PRN Meds:.    Objective: Weight change: 0 lb (0 kg)  Intake/Output Summary (Last 24 hours) at 02/18/14 1054 Last data filed at 02/18/14 0000  Gross per 24 hour  Intake  639.4 ml  Output    650 ml  Net  -10.6 ml   Blood pressure 153/73, pulse 91, temperature 98.2 F (36.8 C), temperature source Oral, resp. rate 16, height _0  (1.702 m), weight 123 lb 10.9 oz (56.1 kg), SpO2 99 %. Temp:  [98.2 F (36.8 C)-98.9 F (37.2 C)] 98.2 F (36.8 C) (11/12 0517) Pulse Rate:  [72-91] 91 (11/12 0517) Resp:  [16] 16 (11/12 0517) BP: (  134-172)/(69-88) 153/73 mmHg (11/12 0517) SpO2:  [98 %-99 %] 99 % (11/12 0517) Weight:  [123 lb 10.9 oz (56.1 kg)] 123 lb 10.9 oz (56.1 kg) (11/12 0420)  Physical Exam: General: ao  ,answering questions HEENT: EOMI CVS tachycardical r,  no murmur rubs or gallops Chest: coarse breath sounds bilaterally Abdomen: softnondistended, normal bowel sounds, Skin: no rashes  Neuro:  She cannot elevate either leg against gravity at all. She can dorsiflex and flex her right ankle she can only flex her left foot but not dorsiflex. With assistance and me holding leg she can contract muscle groups in legs, sensation appears intact  CBC:  CBC Latest Ref Rng 02/18/2014  02/16/2014 02/15/2014  WBC 4.0 - 10.5 K/uL 5.5 6.0 6.1  Hemoglobin 12.0 - 15.0 g/dL 7.5(L) 8.1(L) 8.3(L)  Hematocrit 36.0 - 46.0 % 22.9(L) 25.3(L) 25.4(L)  Platelets 150 - 400 K/uL 181 215 269     BMET  Recent Labs  02/17/14 0544 02/18/14 0358  NA 145  143 144  K 4.2  4.1 3.9  CL 109  107 107  CO2 _0 GLUCOSE 77  74 75  BUN 99*  101* 96*  CREATININE 3.18*  3.14* 2.92*  CALCIUM 8.4  8.5 8.4     Liver Panel   Recent Labs  02/17/14 0544 02/18/14 0358  PROT  --  5.1*  ALBUMIN 1.7* 1.9*  AST  --  20  ALT  --  36*  ALKPHOS  --  62  BILITOT  --  0.4       Sedimentation Rate No results for input(s): ESRSEDRATE in the last 72 hours. C-Reactive Protein No results for input(s): CRP in the last 72 hours.  Micro Results: Recent Results (from the past 720 hour(s))  Urine culture     Status: None   Collection Time: 01/27/14  3:26 PM  Result Value Ref Range Status   Specimen Description URINE, CATHETERIZED  Final   Special Requests NONE  Final   Culture  Setup Time   Final    01/27/2014 22:22 Performed at [place] Performed at [facility]  Final   Culture NO GROWTH Performed at [facility]  Final   Report Status 01/28/2014 FINAL  Final  Culture, blood (routine x 2)     Status: None   Collection Time: 01/29/14  9:19 AM  Result Value Ref Range Status   Specimen Description BLOOD LEFT ARM  Final   Special Requests BOTTLES DRAWN AEROBIC AND ANAEROBIC 10CC  Final   Culture  Setup Time   Final    01/29/2014 15:04 Performed at [place]   Final    NO GROWTH 5 DAYS Performed at [facility]   Report Status 02/04/2014 FINAL  Final  Culture, blood (routine x 2)     Status: None   Collection Time: 01/29/14  9:30 AM  Result Value Ref Range Status   Specimen Description BLOOD LEFT HAND  Final   Special Requests BOTTLES DRAWN AEROBIC AND ANAEROBIC 5CC  Final   Culture   Setup Time   Final    01/29/2014 15:04 Performed at [place]   Final    NO GROWTH 5 DAYS Performed at [facility]   Report Status 02/04/2014 FINAL  Final  Clostridium Difficile by PCR     Status: None   Collection Time: 01/29/14 11:10 AM  Result  Value Ref Range Status   C difficile by pcr NEGATIVE NEGATIVE Final  MRSA PCR Screening     Status: Abnormal   Collection Time: 01/29/14  3:40 PM  Result Value Ref Range Status   MRSA by PCR POSITIVE (A) NEGATIVE Final    Comment:        The GeneXpert MRSA Assay (FDA approved for NASAL specimens only), is one component of a comprehensive MRSA colonization surveillance program. It is not intended to diagnose MRSA infection nor to guide or monitor treatment for MRSA infections. RESULT CALLED TO, READ BACK BY AND VERIFIED WITH: Sherry Ruffing RN 17:50 01/29/14 ([email or web address])  AFB culture, blood     Status: None (Preliminary result)   Collection Time: 01/30/14 11:13 AM  Result Value Ref Range Status   Specimen Description BLOOD LEFT ARM  Final   Special Requests BOTTLES DRAWN AEROBIC ONLY [place]  Final   Culture   Final    CULTURE WILL BE EXAMINED FOR 6 WEEKS BEFORE ISSUING A FINAL REPORT Performed at [facility]   Report Status PENDING  Incomplete  GC/Chlamydia Probe Amp     Status: None   Collection Time: 01/30/14 11:30 AM  Result Value Ref Range Status   CT Probe RNA NEGATIVE NEGATIVE Final   GC Probe RNA NEGATIVE NEGATIVE Final    Comment: (NOTE)                                                                                       **Normal Reference Range: Negative**      Assay performed using the Gen-Probe APTIMA COMBO2 (R) Assay. Acceptable specimen types for this assay include APTIMA Swabs (Unisex, endocervical, urethral, or vaginal), first void urine, and ThinPrep liquid based cytology samples. Performed at [facility]  CSF culture     Status: None   Collection Time: 01/30/14  5:01 PM    Result Value Ref Range Status   Specimen Description CSF  Final   Special Requests NONE  Final   Gram Stain   Final    CYTOSPIN SLIDE WBC PRESENT, PREDOMINANTLY MONONUCLEAR NO ORGANISMS SEEN Performed at [facility] Performed at [facility]   Culture   Final    NO GROWTH 3 DAYS Performed at [facility]   Report Status 02/03/2014 FINAL  Final  Gram stain     Status: None   Collection Time: 01/30/14  5:01 PM  Result Value Ref Range Status   Specimen Description CSF  Final   Special Requests NONE  Final   Gram Stain   Final    CYTOSPIN SLIDE WBC PRESENT, PREDOMINANTLY MONONUCLEAR NO ORGANISMS SEEN   Report Status 01/30/2014 FINAL  Final  Fungus culture, blood     Status: None   Collection Time: 02/01/14  2:00 PM  Result Value Ref Range Status   Specimen Description BLOOD LEFT HAND  Final   Special Requests BOTTLES DRAWN AEROBIC AND ANAEROBIC 10CC  Final   Culture   Final    NO GROWTH 7 DAYS Performed at [facility]    Report Status 02/09/2014 FINAL  Final  Culture, blood (routine x 2)  Status: None   Collection Time: 02/02/14 12:07 AM  Result Value Ref Range Status   Specimen Description BLOOD RIGHT HAND  Final   Special Requests BOTTLES DRAWN AEROBIC ONLY [facility]  Final   Culture  Setup Time   Final    02/02/2014 03:55 Performed at [place]   Final    NO GROWTH 5 DAYS Performed at [facility]    Report Status 02/08/2014 FINAL  Final  Culture, blood (routine x 2)     Status: None   Collection Time: 02/02/14 12:18 AM  Result Value Ref Range Status   Specimen Description BLOOD LEFT HAND  Final   Special Requests BOTTLES DRAWN AEROBIC AND ANAEROBIC [facility] EACH  Final   Culture  Setup Time   Final    02/02/2014 03:55 Performed at [place]   Final    NO GROWTH 5 DAYS Performed at [facility]    Report Status 02/08/2014 FINAL  Final  Pneumocystis smear by DFA     Status:  None   Collection Time: 02/03/14  2:30 PM  Result Value Ref Range Status   Specimen Source-PJSRC SPUTUM  Final   Pneumocystis jiroveci Ag POSITIVE  Final    Comment: Performed at [place] RN 02/04/14 1444 BY [place]  Culture, respiratory (NON-Expectorated)     Status: None   Collection Time: 02/04/14  3:50 AM  Result Value Ref Range Status   Specimen Description TRACHEAL ASPIRATE  Final   Special Requests Immunocompromised  Final   Gram Stain   Final    FEW WBC PRESENT,BOTH PMN AND MONONUCLEAR RARE SQUAMOUS EPITHELIAL CELLS PRESENT NO ORGANISMS SEEN Performed at [facility]   Culture   Final    NO GROWTH 2 DAYS Performed at [facility]   Report Status 02/06/2014 FINAL  Final  AFB culture with smear     Status: None (Preliminary result)   Collection Time: 02/04/14  3:50 AM  Result Value Ref Range Status   Specimen Description TRACHEAL ASPIRATE  Final   Special Requests NONE  Final   Acid Fast Smear   Final    1+ ACID FAST BACILLI SEEN CRITICAL RESULT CALLED TO, READ BACK BY AND VERIFIED WITH: CIARFI 14:55 110/30/15 FUENG CRITICAL RESULT CALLED TO, READ BACK BY AND VERIFIED WITH: LEE ANN STIMPSON H.D. 10:30 02/08/14 FUENG Performed at [facility]    Culture   Final    CULTURE WILL BE EXAMINED FOR 6 WEEKS BEFORE ISSUING A FINAL REPORT Performed at [facility]    Report Status PENDING  Incomplete  Fungus Culture with Smear     Status: None (Preliminary result)   Collection Time: 02/04/14  3:50 AM  Result Value Ref Range Status   Specimen Description TRACHEAL ASPIRATE  Final   Special Requests NONE  Final   Fungal Smear   Final    NO YEAST OR FUNGAL ELEMENTS SEEN Performed at [facility]    Culture   Final    CANDIDA ALBICANS Performed at [facility]    Report Status PENDING  Incomplete  Respiratory virus panel     Status: None   Collection Time: 02/04/14  3:50 AM  Result  Value Ref Range Status   Source - RVPAN TRACHEAL ASPIRATE  Final   Respiratory Syncytial Virus A NOT DETECTED  Final   Respiratory Syncytial Virus B NOT  DETECTED  Final   Influenza A NOT DETECTED  Final   Influenza B NOT DETECTED  Final   Parainfluenza 1 NOT DETECTED  Final   Parainfluenza 2 NOT DETECTED  Final   Parainfluenza 3 NOT DETECTED  Final   Metapneumovirus NOT DETECTED  Final   Rhinovirus NOT DETECTED  Final   Adenovirus NOT DETECTED  Final   Influenza A H1 NOT DETECTED  Final   Influenza A H3 NOT DETECTED  Final    Comment: (NOTE)       Normal Reference Range for each Analyte: NOT DETECTED Testing performed using the Luminex xTAG Respiratory Viral Panel test kit. The analytical performance characteristics of this assay have been determined by [facility].  The modifications have not been cleared or approved by the FDA. This assay has been validated pursuant to the CLIA regulations and is used for clinical purposes. Performed at [facility]. Tuberculosis complex by PCR     Status: None   Collection Time: 02/04/14  3:50 AM  Result Value Ref Range Status   M. tuberculosis, Direct Not detected Not detected Final    Comment: (NOTE) This test(s) was developed and its performance characteristics  have been determined by [employer] [facility],  [place], [place]. Performance characteristics refer to the analytical  performance of the test. This test should not be used as a  substitute for culture. It should be used as an adjunct to  culture.    Source (MTBPCR) Tracheal Aspirate  Corrected    Comment: Performed at [place] 11/10 AT 0518: PREVIOUSLY REPORTED AS TRACHEAL ASPIRATE   Culture, bal-quantitative     Status: None   Collection Time: 02/04/14 10:31 AM  Result Value Ref Range Status   Specimen Description BRONCHIAL ALVEOLAR LAVAGE  Final   Special Requests Immunocompromised  Final   Gram Stain   Final    FEW  WBC PRESENT, PREDOMINANTLY MONONUCLEAR RARE SQUAMOUS EPITHELIAL CELLS PRESENT NO ORGANISMS SEEN Performed at [place] Performed at [facility]  Final   Culture   Final    NO GROWTH 2 DAYS Performed at [facility]   Report Status 02/06/2014 FINAL  Final  M. Tuberculosis complex by PCR     Status: None   Collection Time: 02/06/14  6:14 PM  Result Value Ref Range Status   M. tuberculosis, Direct Not detected Not detected Final    Comment: (NOTE) This test(s) was developed and its performance characteristics  have been determined by [facility],  [place], [place]. Performance characteristics refer to the analytical  performance of the test.    Source (MTBPCR) Sputum  Corrected    Comment: Performed at [place] 11/06 AT 0442: PREVIOUSLY REPORTED AS SPUTUM   AFB culture with smear     Status: None (Preliminary result)   Collection Time: 02/06/14  6:14 PM  Result Value Ref Range Status   Specimen Description SPUTUM  Final   Special Requests NONE  Final   Acid Fast Smear   Final    1+ ACID FAST BACILLI SEEN REFER TO PREVIOUS CULTURE [id number] FOR CALLS Performed at [facility]    Culture   Final    CULTURE WILL BE EXAMINED FOR 6 WEEKS BEFORE ISSUING A FINAL REPORT Performed at [facility]    Report Status PENDING  Incomplete  Culture, blood (routine x 2)  Status: None   Collection Time: 02/07/14  6:26 PM  Result Value Ref Range Status   Specimen Description BLOOD LEFT ARM  Final   Special Requests BOTTLES DRAWN AEROBIC AND ANAEROBIC 10CC  Final   Culture  Setup Time   Final    02/08/2014 02:25 Performed at [facility]    Culture   Final    NO GROWTH 5 DAYS Performed at [facility]    Report Status 02/15/2014 FINAL  Final  Culture, blood (routine x 2)     Status: None   Collection Time: 02/07/14  6:32 PM  Result Value Ref Range Status    Specimen Description BLOOD BLOOD LEFT FOREARM  Final   Special Requests BOTTLES DRAWN AEROBIC ONLY 10CC  Final   Culture  Setup Time   Final    02/08/2014 02:25 Performed at [facility]    Culture   Final    NO GROWTH 5 DAYS Performed at [facility]    Report Status 02/15/2014 FINAL  Final  Clostridium Difficile by PCR     Status: None   Collection Time: 02/17/14  2:36 AM  Result Value Ref Range Status   C difficile by pcr NEGATIVE NEGATIVE Final    Studies/Results: No results found.    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 I cannot explain focal weakness by HSV2 encephalitis.  --continue acyclovir and aim for 21 days total (1 more day)  #3 PCP Pneumonia: continue clindamycin Primaquine and steroids for 21 day course She needs steroid tapered, and went to  75m few days ago  #4 Renal failure :started ARV, renal failure likely multifactorial. 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 This nuance of her belief system will need to be respected and we may need to engage her better to convince her that she can be cured of this "curse" whilst taking her ARV meds     LOS: 22 days   Alcide Evener 02/18/2014, 10:54 AM

## 2014-02-18 NOTE — Progress Notes (Signed)
Physical Therapy Treatment Patient Details Name: Desiree BeamsKristy Hale MRN: [id number] DOB: 1970/10/17 Today's Date: 02/18/2014    History of Present Illness 43 year old female with past medical history of hypertension, pancreatic cancer (per ED resident note, but this is not noted in H&P), marijuana use who presented [facility] 01/27/2014 after she was found unresponsive at home by a family member. Patient was found shaking, lying on the floor, unable to speak.    PT Comments    Limited to exercise due to pt recently back in bed from time up in the chair  Follow Up Recommendations  SNF     Equipment Recommendations       Recommendations for Other Services       Precautions / Restrictions Precautions Precautions: Fall    Mobility  Bed Mobility                  Transfers                    Ambulation/Gait                 Stairs            Wheelchair Mobility    Modified Rankin (Stroke Patients Only)       Balance                                    Cognition Arousal/Alertness: Awake/alert Behavior During Therapy: WFL for tasks assessed/performed Overall Cognitive Status: Impaired/Different from baseline     Current Attention Level: Sustained   Following Commands: Follows one step commands inconsistently            Exercises General Exercises - Upper Extremity Elbow Flexion: AROM;Strengthening;Both;10 reps;Supine Elbow Extension: AROM;Strengthening;Both;10 reps;Supine General Exercises - Lower Extremity Heel Slides: AAROM;PROM;Both;10 reps;Supine Hip ABduction/ADduction: AAROM;Both;Strengthening;10 reps;Supine    General Comments        Pertinent Vitals/Pain Pain Assessment: Faces Faces Pain Scale: Hurts even more Pain Location: calves and legs Pain Descriptors / Indicators: Cramping Pain Intervention(s): Limited activity within patient's tolerance    Home Living                      Prior Function             PT Goals (current goals can now be found in the care plan section) Acute Rehab PT Goals PT Goal Formulation: With patient Time For Goal Achievement: 03/02/14 Potential to Achieve Goals: Fair Progress towards PT goals: Not progressing toward goals - comment    Frequency  Min 2X/week    PT Plan Current plan remains appropriate;Frequency needs to be updated    Co-evaluation             End of Session Equipment Utilized During Treatment: Oxygen Activity Tolerance: Patient limited by fatigue Patient left: in bed;with call bell/phone within reach     Time: 1711-1729 PT Time Calculation (min) (ACUTE ONLY): 18 min  Charges:  $Therapeutic Exercise: 8-22 mins                    G Codes:      Glory Graefe, Eliseo GumKenneth V 02/18/2014, 6:41 PM 02/18/2014  [place] BingKen Oluchi Pucci, PT [phone]  (pager)

## 2014-02-18 NOTE — Progress Notes (Signed)
PROGRESS NOTE  Desiree Hale ZOX:[id number] DOB: July 30, 1970 DOA: 01/27/2014 PCP: No primary care provider on file.  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 She was initially admitted with acute encephalopathy on the hospitalist service, then decompensated from a respiratory standpoint and was intubated 10/28. Patient's respiratory status has gradually improved and was able to be extubated on 11/5, and currently she is comfortable on room air. Her course was complicated by acute renal failure thought to be multifactorial due to nephrotoxic agents, ATN.  Her acute encephalopathy has been very slow to improve and she has intermittent periods of confusion alternating with alertness. On 11/10 her cognitive ability was significantly improved.   Infectious disease, nephrology, PCCM and as well as palliative care have been involved in her care.   Subjective/ 24 H Interval events Patient reports she received both a muscle relaxant and pain medication and it made her sleep well.  She complains of irritation at the top of her inner thighs.  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 Patient intubated from 10/28 until 11/5.  Now breathing comfortably on room air. CXR 11/5 clear  Pneumocystis pneumonia - continue  antibiotics and steroids.  Patient is now asymptomatic.  Tapering steroids (20 mg prednisone starting 11/12).  She is on clindamycin, primaquine.  Being followed by ID.  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  She needs 1 more day of IV acyclovir.  Mental status much improved but patient still appears to lack insight and capacity.  Psych has been consulted to determine capacity.  Lower Extremity Weakness - patient with profound weakness in her LEs.  Unable to left legs against gravity.  PT Eval completed recommended SNF.   Repeat MRI ordered to rule out acute changes.  Current plan is to D/C to SNF as patient is unable to bear her own weight and very dependent on assistance with ADLs.  Elevated troponin level - in the setting of demand ischemia and acute renal failure. Patient denies chest pain.  Acute on chronic diastolic heart failure - On beta blocker.  No sign of volume overload.   Acute on chronic renal failure with metabolic acidosis - Likely ATN from antibioitics with possible ischemia from hypotension. Nephrology was following.  They were concerned for HIV related kidney disease, but unfortunately patient has been too sick to biopsy.  BUN/CR is down trending.  Nephrology has followed her 2x during this hospitalization and has signed off.  Protein calorie malnutrition - nutrition consulted.  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  Patient first learned of her HIV diagnosis in 2008.  On fluconazole weekly.  ID working to gain urgent ADAP approval.  Need to gain this prior to discharge.    Seizure disorder - stable.  She  is on Keppra  Hypertension - moderately controlled.  Added norvasc 11/11.   Continue labetalol   Diet: regular Fluids: none  DVT Prophylaxis: heparin  Code Status: Full Family Communication: Spoke with Brother Threasa Heads and Sister in law Breck Coons (they are supportive of SNF) Disposition Plan: Plan for D/C to SNF (Need to talk with Son and Dtr as son was planning to take her to his home).  Consultants:  ID  Nephrology  PCCM  Palliative  Psychiatry  Procedures:   10/22 EEG >>> normal drowsy and asleep electroencephalogram. No epileptiform activity is noted.  10/22 ECHO >>> EF 55-60%, grade 1 diastolic dysfunction, no RWMA, small free-flowing pericardial effusion 10/28 Worsening tachypnea, AMS, tx ICU. Required intubation  10/30 Renal US >> kidneys diffusely echogenic, concerning for medical renal disease 10/31 received 1 unit PRBC's  02/11/14: Extubated   Antibiotics Anti-infectives    Start     Dose/Rate Route Frequency Ordered Stop   02/20/14 1000  fluconazole (DIFLUCAN) 40 MG/ML suspension 100 mg     100 mg Per Tube Weekly 02/13/14 1213     02/17/14 0000  azithromycin (ZITHROMAX) tablet 1,200 mg     1,200 mg Oral Weekly 02/15/14 1309     02/15/14 1400  clindamycin (CLEOCIN) capsule 600 mg     600 mg Oral 3 times per day 02/15/14 1311     02/13/14 1300  azithromycin (ZITHROMAX) tablet 500 mg  Status:  Discontinued     500 mg Oral Daily 02/13/14 1206 02/15/14 1240   02/09/14 1000  lamiVUDine (EPIVIR) 10 MG/ML solution 100 mg     100 mg Oral Daily 02/08/14 1149     02/08/14 1600  azithromycin (ZITHROMAX) tablet 1,200 mg  Status:  Discontinued     1,200 mg Per Tube Weekly 02/03/14 0955 02/03/14 1012   02/08/14 1300  abacavir (ZIAGEN) tablet 600 mg     600 mg Per NG tube Daily 02/08/14 1115     02/08/14 1200  lamiVUDine (EPIVIR) 10 MG/ML solution 150 mg     150 mg Per Tube  Once 02/08/14 1115 02/08/14 1320   02/08/14 1200  dolutegravir (TIVICAY) tablet 50 mg    Comments:  Per NG TUBE   50 mg Oral 2 times daily 02/08/14 1115     02/08/14 1000  azithromycin (ZITHROMAX) 200 MG/5ML suspension 1,200 mg  Status:  Discontinued     1,200 mg Oral Weekly 02/03/14 1013 02/03/14  1015   02/08/14 1000  azithromycin (ZITHROMAX) 200 MG/5ML suspension 1,200 mg  Status:  Discontinued     1,200 mg Per Tube Weekly 02/03/14 1015 02/05/14 1542   02/06/14 1200  acyclovir (ZOVIRAX) 470 mg in dextrose 5 % 100 mL IVPB     10 mg/kg  47 kg109.4 mL/hr over 60 Minutes Intravenous Every 24 hours 02/06/14 1110     02/05/14 2200  clindamycin (CLEOCIN) IVPB 600 mg  Status:  Discontinued     600 mg100 mL/hr over 30 Minutes Intravenous 3 times per day 02/05/14 1542 02/15/14 1311   02/05/14 2200  primaquine tablet 30 mg     30 mg Per Tube Every 24 hours 02/05/14 1542     02/05/14 1700  azithromycin (ZITHROMAX) 200 MG/5ML suspension 600 mg  Status:  Discontinued     600 mg Per Tube Daily 02/05/14 1542 02/13/14 1206   02/05/14 1700  ethambutol (MYAMBUTOL) tablet 800 mg  Status:  Discontinued     800 mg Oral Every 24 hours 02/05/14 1542  02/05/14 1545   02/05/14 1700  ethambutol (MYAMBUTOL) tablet 800 mg  Status:  Discontinued     800 mg Per Tube Every 24 hours 02/05/14 1545 02/15/14 1240   02/05/14 1600  rifabutin (MYCOBUTIN) capsule 150 mg  Status:  Discontinued     150 mg Oral Daily 02/05/14 1542 02/15/14 1240   02/05/14 1200  acyclovir (ZOVIRAX) 450 mg in dextrose 5 % 100 mL IVPB  Status:  Discontinued     10 mg/kg  45 kg109 mL/hr over 60 Minutes Intravenous Every 24 hours 02/04/14 1554 02/04/14 1601   02/05/14 1200  acyclovir (ZOVIRAX) 400 mg in dextrose 5 % 100 mL IVPB  Status:  Discontinued     10 mg/kg  40 kg (Order-Specific)108 mL/hr over 60 Minutes Intravenous Every 24 hours 02/04/14 1601 02/06/14 1110   02/05/14 1200  sulfamethoxazole-trimethoprim (BACTRIM) 240 mg of trimethoprim in dextrose 5 % 250 mL IVPB  Status:  Discontinued     240 mg of trimethoprim265 mL/hr over 60 Minutes Intravenous Every 12 hours 02/05/14 1156 02/05/14 1510   02/05/14 1000  fluconazole (DIFLUCAN) 40 MG/ML suspension 100 mg  Status:  Discontinued     100 mg Per Tube Daily 02/04/14 1759 02/13/14 1213    02/04/14 2000  piperacillin-tazobactam (ZOSYN) IVPB 2.25 g     2.25 g100 mL/hr over 30 Minutes Intravenous Every 8 hours 02/04/14 1613 02/04/14 2121   02/04/14 1100  ganciclovir (CYTOVENE) 55 mg in sodium chloride 0.9 % 100 mL IVPB  Status:  Discontinued     1.25 mg/kg  45 kg100 mL/hr over 60 Minutes Intravenous Every 24 hours 02/04/14 1007 02/04/14 1450   02/04/14 1000  fluconazole (DIFLUCAN) tablet 400 mg  Status:  Discontinued     400 mg Per Tube Daily 02/03/14 0955 02/03/14 1015   02/04/14 1000  fluconazole (DIFLUCAN) 40 MG/ML suspension 400 mg  Status:  Discontinued     400 mg Per Tube Daily 02/03/14 1015 02/03/14 1136   02/04/14 1000  fluconazole (DIFLUCAN) 40 MG/ML suspension 200 mg  Status:  Discontinued     200 mg Per Tube Daily 02/03/14 1136 02/04/14 1759   02/04/14 1000  acyclovir (ZOVIRAX) 400 mg in dextrose 5 % 100 mL IVPB  Status:  Discontinued     10 mg/kg  40 kg108 mL/hr over 60 Minutes Intravenous Every 24 hours 02/03/14 1412 02/04/14 0952   02/04/14 0000  sulfamethoxazole-trimethoprim (BACTRIM) 200 mg of trimethoprim in dextrose 5 % 250 mL IVPB  Status:  Discontinued     5 mg/kg of trimethoprim  40 kg262.5 mL/hr over 60 Minutes Intravenous Every 12 hours 02/03/14 1412 02/05/14 1156   02/03/14 2000  piperacillin-tazobactam (ZOSYN) IVPB 2.25 g  Status:  Discontinued     2.25 g100 mL/hr over 30 Minutes Intravenous Every 8 hours 02/03/14 1412 02/04/14 1613   02/02/14 1400  sulfamethoxazole-trimethoprim (BACTRIM) 200 mg in dextrose 5 % 250 mL IVPB  Status:  Discontinued     15 mg/kg/day  40 kg262.5 mL/hr over 60 Minutes Intravenous 3 times per day 02/02/14 1354 02/03/14 1412   02/02/14 1300  fluconazole (DIFLUCAN) tablet 400 mg  Status:  Discontinued     400 mg Oral Daily 02/02/14 1026 02/03/14 0955   02/01/14 1700  sulfamethoxazole-trimethoprim (BACTRIM DS) 800-160 MG per tablet 1 tablet  Status:  Discontinued     1 tablet Oral Once per day on Mon Wed Fri 02/01/14 1549 02/02/14  1316   02/01/14 1600  azithromycin (ZITHROMAX) tablet 1,200 mg  Status:  Discontinued     1,200 mg Oral Weekly 02/01/14 1515 02/03/14 0955   02/01/14 1000  acyclovir (ZOVIRAX) 420 mg in dextrose 5 % 100 mL IVPB  Status:  Discontinued     10 mg/kg  41.9 kg108.4 mL/hr over 60 Minutes Intravenous Every 12 hours 02/01/14 0909 02/03/14 1412   02/01/14 1000  vancomycin (VANCOCIN) IVPB 750 mg/150 ml premix  Status:  Discontinued     750 mg150 mL/hr over 60 Minutes Intravenous Every 24 hours 02/01/14 0910 02/03/14 0910   02/01/14 0823  vancomycin (VANCOCIN) 500 mg in sodium chloride 0.9 % 100 mL IVPB  Status:  Discontinued     500 mg100 mL/hr over 60 Minutes Intravenous Every 24 hours 01/31/14 1029 02/01/14 0909   01/31/14 1200  piperacillin-tazobactam (ZOSYN) IVPB 3.375 g  Status:  Discontinued     3.375 g12.5 mL/hr over 240 Minutes Intravenous Every 8 hours 01/31/14 1029 02/03/14 1412   01/30/14 2100  acyclovir (ZOVIRAX) 390 mg in dextrose 5 % 100 mL IVPB  Status:  Discontinued     10 mg/kg  39.1 kg107.8 mL/hr over 60 Minutes Intravenous Every 24 hours 01/30/14 2018 02/01/14 0908   01/29/14 1600  acyclovir (ZOVIRAX) 420 mg in dextrose 5 % 100 mL IVPB  Status:  Discontinued     420 mg108.4 mL/hr over 60 Minutes Intravenous Every 24 hours 01/29/14 1457 01/29/14 2015   01/29/14 0800  cefTRIAXone (ROCEPHIN) 1 g in dextrose 5 % 50 mL IVPB  Status:  Discontinued     1 g100 mL/hr over 30 Minutes Intravenous Every 24 hours 01/29/14 0707 01/29/14 0710   01/29/14 0800  vancomycin (VANCOCIN) 500 mg in sodium chloride 0.9 % 100 mL IVPB  Status:  Discontinued     500 mg100 mL/hr over 60 Minutes Intravenous Every 48 hours 01/29/14 0719 01/31/14 1029   01/29/14 0730  piperacillin-tazobactam (ZOSYN) IVPB 2.25 g  Status:  Discontinued     2.25 g100 mL/hr over 30 Minutes Intravenous 4 times per day 01/29/14 0719 01/31/14 1028   01/29/14 0715  azithromycin (ZITHROMAX) 500 mg in dextrose 5 % 250 mL IVPB  Status:   Discontinued     500 mg250 mL/hr over 60 Minutes Intravenous Every 24 hours 01/29/14 0704 01/29/14 0710        Studies  CXR 11/5 There is no evidence of pneumonia nor other acute cardiopulmonary abnormality.  Objective  Filed Vitals:   02/18/14 0120 02/18/14 0250 02/18/14 0420 02/18/14 0517  BP: 172/75 134/73  153/73  Pulse: 79 74  91  Temp:    98.2 F (36.8 C)  TempSrc:    Oral  Resp:    16  Height:      Weight:   56.1 kg (123 lb 10.9 oz)   SpO2:    99%    Intake/Output Summary (Last 24 hours) at 02/18/14 1300 Last data filed at 02/18/14 0000  Gross per 24 hour  Intake    530 ml  Output      0 ml  Net    530 ml   Filed Weights   02/16/14 0500 02/17/14 0529 02/18/14 0420  Weight: 58.5 kg (128 lb 15.5 oz) 56.1 kg (123 lb 10.9 oz) 56.1 kg (123 lb 10.9 oz)    Exam:  General:  Wd, AA female, lying in bed. NAD.  Comfortable. pleasant  Cardiovascular: RRR, no frank m/r/g, no lower extremity edema  Respiratory: CTA biL on anterior auscultation, no accessory muscle use.  Abdomen: very  thin, soft, nontender, nd, +bs, no masses  MSK: no peripheral edema, unable to lift LE against gravity.    Psych: Appropriate, cooperative. Mildly confused today.   Data Reviewed: Basic Metabolic Panel:  Recent Labs Lab 02/12/14 0500 02/13/14 0412 02/13/14 0415 02/14/14 0605 02/15/14 0525 02/16/14 0706 02/17/14 0544 02/18/14 0358  NA 145 138  --  142 143 142 145  143 144  K 5.3 4.5  --  4.3 4.7 4.4 4.2  4.1 3.9  CL 103 99  --  103 103 105 109  107 107  CO2 26 25  --  23 22 21 21  21 23   GLUCOSE 163* 107*  --  90 105* 75 77  74 75  BUN 99* 105*  --  113* 119* 110* 99*  101* 96*  CREATININE 3.20* 2.94*  --  3.01* 3.16* 3.16* 3.18*  3.14* 2.92*  CALCIUM 8.8 8.5  --  8.8 8.6 8.7 8.4  8.5 8.4  MG  --   --  2.1 2.0 2.0  --   --   --   PHOS 7.7* 6.6*  --  7.8*  --  8.3* 7.1*  --    Liver Function Tests:  Recent Labs Lab 02/13/14 0412 02/14/14 0605 02/16/14 0706  02/17/14 0544 02/18/14 0358  AST  --   --   --   --  20  ALT  --   --   --   --  36*  ALKPHOS  --   --   --   --  62  BILITOT  --   --   --   --  0.4  PROT  --   --   --   --  5.1*  ALBUMIN 1.7* 1.7* 1.8* 1.7* 1.9*   CBC:  Recent Labs Lab 02/13/14 0415 02/14/14 0605 02/15/14 0525 02/16/14 0706 02/18/14 0358  WBC 6.3 5.4 6.1 6.0 5.5  HGB 9.0* 8.9* 8.3* 8.1* 7.5*  HCT 27.0* 27.1* 25.4* 25.3* 22.9*  MCV 76.9* 78.8 78.9 81.6 79.5  PLT 306 294 269 215 181   CBG:  Recent Labs Lab 02/17/14 2010 02/17/14 2354 02/18/14 0417 02/18/14 0805 02/18/14 1222  GLUCAP 137* 127* 77 90 107*    Recent Results (from the past 240 hour(s))  Clostridium Difficile by PCR     Status: None   Collection Time: 02/17/14  2:36 AM  Result Value Ref Range Status   C difficile by pcr NEGATIVE NEGATIVE Final     Scheduled Meds: . abacavir  600 mg Per NG tube Daily  . acyclovir  10 mg/kg Intravenous Q24H  . amLODipine  5 mg Oral Daily  . aspirin  81 mg Oral Daily  . azithromycin  1,200 mg Oral Weekly  . clindamycin  600 mg Oral 3 times per day  . dolutegravir  50 mg Oral BID  . feeding supplement (ENSURE COMPLETE)  237 mL Oral BID BM  . feeding supplement (PRO-STAT SUGAR FREE 64)  30 mL Oral BID BM  . [START ON 02/20/2014] fluconazole  100 mg Per Tube Weekly  . heparin subcutaneous  5,000 Units Subcutaneous 3 times per day  . iron polysaccharides  150 mg Oral Daily  . labetalol  200 mg Oral BID  . lamiVUDine  100 mg Oral Daily  . levETIRAcetam  500 mg Oral BID  . pantoprazole  40 mg Oral Daily  . predniSONE  20 mg Oral Q breakfast  . primaquine  30 mg Per Tube Q24H  Continuous Infusions:   Time spent: 45 minutes  Algis Downs, [place] [facility] Pager [phone]. If 7 PM - 7 AM, please contact night-coverage at [email or web address], password [email or web address] 02/18/2014, 1:00 PM  LOS: 22 days

## 2014-02-18 NOTE — Progress Notes (Signed)
NP Schorr notified concerning patient's b/p. Awaiting any further orders

## 2014-02-19 DIAGNOSIS — Z9911 Dependence on respirator [ventilator] status: Secondary | ICD-10-CM

## 2014-02-19 LAB — GLUCOSE, CAPILLARY
GLUCOSE-CAPILLARY: 102 mg/dL — AB (ref 70–99)
GLUCOSE-CAPILLARY: 109 mg/dL — AB (ref 70–99)
Glucose-Capillary: 102 mg/dL — ABNORMAL HIGH (ref 70–99)
Glucose-Capillary: 129 mg/dL — ABNORMAL HIGH (ref 70–99)
Glucose-Capillary: 135 mg/dL — ABNORMAL HIGH (ref 70–99)
Glucose-Capillary: 158 mg/dL — ABNORMAL HIGH (ref 70–99)

## 2014-02-19 MED ORDER — HYDROCODONE-ACETAMINOPHEN 5-325 MG PO TABS
1.0000 | ORAL_TABLET | Freq: Once | ORAL | Status: AC
  Administered 2014-02-19: 1 via ORAL
  Filled 2014-02-19: qty 1

## 2014-02-19 NOTE — Progress Notes (Signed)
[facility]    Day 21 Acyclovir DAy 16  pcp rx  IV clinda/primaquin )  On weekly prophlyactic azithromycin now and prophylactic fluconazole  10 days emb/rifabutin/azithro  Day 14 fluconazole  Zosyn D/cd after 7 days    Subjective: In much better spirits today   Antibiotics:  Anti-infectives    Start     Dose/Rate Route Frequency Ordered Stop   02/20/14 1000  fluconazole (DIFLUCAN) 40 MG/ML suspension 100 mg     100 mg Per Tube Weekly 02/13/14 1213     02/17/14 0000  azithromycin (ZITHROMAX) tablet 1,200 mg     1,200 mg Oral Weekly 02/15/14 1309     02/15/14 1400  clindamycin (CLEOCIN) capsule 600 mg     600 mg Oral 3 times per day 02/15/14 1311     02/13/14 1300  azithromycin (ZITHROMAX) tablet 500 mg  Status:  Discontinued     500 mg Oral Daily 02/13/14 1206 02/15/14 1240   02/09/14 1000  lamiVUDine (EPIVIR) 10 MG/ML solution 100 mg     100 mg Oral Daily 02/08/14 1149     02/08/14 1600  azithromycin (ZITHROMAX) tablet 1,200 mg  Status:  Discontinued     1,200 mg Per Tube Weekly 02/03/14 0955 02/03/14 1012   02/08/14 1300  abacavir (ZIAGEN) tablet 600 mg     600 mg Per NG tube Daily 02/08/14 1115     02/08/14 1200  lamiVUDine (EPIVIR) 10 MG/ML solution 150 mg     150 mg Per Tube  Once 02/08/14 1115 02/08/14 1320   02/08/14 1200  dolutegravir (TIVICAY) tablet 50 mg    Comments:  Per NG TUBE   50 mg Oral 2 times daily 02/08/14 1115     02/08/14 1000  azithromycin (ZITHROMAX) 200 MG/5ML suspension 1,200 mg  Status:  Discontinued     1,200 mg Oral Weekly 02/03/14 1013 02/03/14 1015   02/08/14 1000  azithromycin (ZITHROMAX) 200 MG/5ML suspension 1,200 mg  Status:  Discontinued     1,200 mg Per Tube Weekly 02/03/14 1015 02/05/14 1542   02/06/14 1200  acyclovir (ZOVIRAX) 470 mg in dextrose 5 % 100 mL IVPB  Status:  Discontinued      10 mg/kg  47 kg109.4 mL/hr over 60 Minutes Intravenous Every 24 hours 02/06/14 1110 02/19/14 1444   02/05/14 2200  clindamycin (CLEOCIN) IVPB 600 mg  Status:  Discontinued     600 mg100 mL/hr over 30 Minutes Intravenous 3 times per day 02/05/14 1542 02/15/14 1311   02/05/14 2200  primaquine tablet 30 mg     30 mg Per Tube Every 24 hours 02/05/14 1542     02/05/14 1700  azithromycin (ZITHROMAX) 200 MG/5ML suspension 600 mg  Status:  Discontinued     600 mg Per Tube Daily 02/05/14 1542 02/13/14 1206   02/05/14 1700  ethambutol (MYAMBUTOL) tablet 800 mg  Status:  Discontinued     800 mg Oral Every 24 hours 02/05/14 1542 02/05/14 1545   02/05/14 1700  ethambutol (MYAMBUTOL) tablet 800 mg  Status:  Discontinued     800 mg Per Tube Every 24 hours 02/05/14 1545 02/15/14 1240   02/05/14 1600  rifabutin (MYCOBUTIN) capsule 150 mg  Status:  Discontinued     150 mg Oral Daily 02/05/14 1542 02/15/14 1240   02/05/14 1200  acyclovir (ZOVIRAX) 450 mg in dextrose 5 % 100 mL IVPB  Status:  Discontinued     10 mg/kg  45 kg109 mL/hr over  60 Minutes Intravenous Every 24 hours 02/04/14 1554 02/04/14 1601   02/05/14 1200  acyclovir (ZOVIRAX) 400 mg in dextrose 5 % 100 mL IVPB  Status:  Discontinued     10 mg/kg  40 kg (Order-Specific)108 mL/hr over 60 Minutes Intravenous Every 24 hours 02/04/14 1601 02/06/14 1110   02/05/14 1200  sulfamethoxazole-trimethoprim (BACTRIM) 240 mg of trimethoprim in dextrose 5 % 250 mL IVPB  Status:  Discontinued     240 mg of trimethoprim265 mL/hr over 60 Minutes Intravenous Every 12 hours 02/05/14 1156 02/05/14 1510   02/05/14 1000  fluconazole (DIFLUCAN) 40 MG/ML suspension 100 mg  Status:  Discontinued     100 mg Per Tube Daily 02/04/14 1759 02/13/14 1213   02/04/14 2000  piperacillin-tazobactam (ZOSYN) IVPB 2.25 g     2.25 g100 mL/hr over 30 Minutes Intravenous Every 8 hours 02/04/14 1613 02/04/14 2121   02/04/14 1100  ganciclovir (CYTOVENE) 55 mg in sodium chloride 0.9 % 100  mL IVPB  Status:  Discontinued     1.25 mg/kg  45 kg100 mL/hr over 60 Minutes Intravenous Every 24 hours 02/04/14 1007 02/04/14 1450   02/04/14 1000  fluconazole (DIFLUCAN) tablet 400 mg  Status:  Discontinued     400 mg Per Tube Daily 02/03/14 0955 02/03/14 1015   02/04/14 1000  fluconazole (DIFLUCAN) 40 MG/ML suspension 400 mg  Status:  Discontinued     400 mg Per Tube Daily 02/03/14 1015 02/03/14 1136   02/04/14 1000  fluconazole (DIFLUCAN) 40 MG/ML suspension 200 mg  Status:  Discontinued     200 mg Per Tube Daily 02/03/14 1136 02/04/14 1759   02/04/14 1000  acyclovir (ZOVIRAX) 400 mg in dextrose 5 % 100 mL IVPB  Status:  Discontinued     10 mg/kg  40 kg108 mL/hr over 60 Minutes Intravenous Every 24 hours 02/03/14 1412 02/04/14 0952   02/04/14 0000  sulfamethoxazole-trimethoprim (BACTRIM) 200 mg of trimethoprim in dextrose 5 % 250 mL IVPB  Status:  Discontinued     5 mg/kg of trimethoprim  40 kg262.5 mL/hr over 60 Minutes Intravenous Every 12 hours 02/03/14 1412 02/05/14 1156   02/03/14 2000  piperacillin-tazobactam (ZOSYN) IVPB 2.25 g  Status:  Discontinued     2.25 g100 mL/hr over 30 Minutes Intravenous Every 8 hours 02/03/14 1412 02/04/14 1613   02/02/14 1400  sulfamethoxazole-trimethoprim (BACTRIM) 200 mg in dextrose 5 % 250 mL IVPB  Status:  Discontinued     15 mg/kg/day  40 kg262.5 mL/hr over 60 Minutes Intravenous 3 times per day 02/02/14 1354 02/03/14 1412   02/02/14 1300  fluconazole (DIFLUCAN) tablet 400 mg  Status:  Discontinued     400 mg Oral Daily 02/02/14 1026 02/03/14 0955   02/01/14 1700  sulfamethoxazole-trimethoprim (BACTRIM DS) 800-160 MG per tablet 1 tablet  Status:  Discontinued     1 tablet Oral Once per day on Mon Wed Fri 02/01/14 1549 02/02/14 1316   02/01/14 1600  azithromycin (ZITHROMAX) tablet 1,200 mg  Status:  Discontinued     1,200 mg Oral Weekly 02/01/14 1515 02/03/14 0955   02/01/14 1000  acyclovir (ZOVIRAX) 420 mg in dextrose 5 % 100 mL IVPB  Status:   Discontinued     10 mg/kg  41.9 kg108.4 mL/hr over 60 Minutes Intravenous Every 12 hours 02/01/14 0909 02/03/14 1412   02/01/14 1000  vancomycin (VANCOCIN) IVPB 750 mg/150 ml premix  Status:  Discontinued     750 mg150 mL/hr over 60 Minutes Intravenous Every 24 hours 02/01/14  7681 02/03/14 0910   02/01/14 0823  vancomycin (VANCOCIN) 500 mg in sodium chloride 0.9 % 100 mL IVPB  Status:  Discontinued     500 mg100 mL/hr over 60 Minutes Intravenous Every 24 hours 01/31/14 1029 02/01/14 0909   01/31/14 1200  piperacillin-tazobactam (ZOSYN) IVPB 3.375 g  Status:  Discontinued     3.375 g12.5 mL/hr over 240 Minutes Intravenous Every 8 hours 01/31/14 1029 02/03/14 1412   01/30/14 2100  acyclovir (ZOVIRAX) 390 mg in dextrose 5 % 100 mL IVPB  Status:  Discontinued     10 mg/kg  39.1 kg107.8 mL/hr over 60 Minutes Intravenous Every 24 hours 01/30/14 2018 02/01/14 0908   01/29/14 1600  acyclovir (ZOVIRAX) 420 mg in dextrose 5 % 100 mL IVPB  Status:  Discontinued     420 mg108.4 mL/hr over 60 Minutes Intravenous Every 24 hours 01/29/14 1457 01/29/14 2015   01/29/14 0800  cefTRIAXone (ROCEPHIN) 1 g in dextrose 5 % 50 mL IVPB  Status:  Discontinued     1 g100 mL/hr over 30 Minutes Intravenous Every 24 hours 01/29/14 0707 01/29/14 0710   01/29/14 0800  vancomycin (VANCOCIN) 500 mg in sodium chloride 0.9 % 100 mL IVPB  Status:  Discontinued     500 mg100 mL/hr over 60 Minutes Intravenous Every 48 hours 01/29/14 0719 01/31/14 1029   01/29/14 0730  piperacillin-tazobactam (ZOSYN) IVPB 2.25 g  Status:  Discontinued     2.25 g100 mL/hr over 30 Minutes Intravenous 4 times per day 01/29/14 0719 01/31/14 1028   01/29/14 0715  azithromycin (ZITHROMAX) 500 mg in dextrose 5 % 250 mL IVPB  Status:  Discontinued     500 mg250 mL/hr over 60 Minutes Intravenous Every 24 hours 01/29/14 0704 01/29/14 0710      Medications: Scheduled Meds: . abacavir  600 mg Per NG tube Daily  . amLODipine  5 mg Oral Daily  . aspirin  81  mg Oral Daily  . azithromycin  1,200 mg Oral Weekly  . clindamycin  600 mg Oral 3 times per day  . dolutegravir  50 mg Oral BID  . feeding supplement (ENSURE COMPLETE)  237 mL Oral BID BM  . feeding supplement (PRO-STAT SUGAR FREE 64)  30 mL Oral BID BM  . [START ON 02/20/2014] fluconazole  100 mg Per Tube Weekly  . heparin subcutaneous  5,000 Units Subcutaneous 3 times per day  . iron polysaccharides  150 mg Oral Daily  . labetalol  200 mg Oral BID  . lamiVUDine  100 mg Oral Daily  . levETIRAcetam  500 mg Oral BID  . pantoprazole  40 mg Oral Daily  . primaquine  30 mg Per Tube Q24H   Continuous Infusions:   PRN Meds:.    Objective: Weight change: 4 lb 13.6 oz (2.2 kg)  Intake/Output Summary (Last 24 hours) at 02/19/14 1522 Last data filed at 02/19/14 0443  Gross per 24 hour  Intake    726 ml  Output    375 ml  Net    351 ml   Blood pressure 140/74, pulse 93, temperature 99.2 F (37.3 C), temperature source Oral, resp. rate 20, height 5' 7"  (1.702 m), weight 128 lb 8.5 oz (58.3 kg), SpO2 95 %. Temp:  [98.4 F (36.9 C)-99.2 F (37.3 C)] 99.2 F (37.3 C) (11/13 1351) Pulse Rate:  [68-93] 93 (11/13 1351) Resp:  [16-20] 20 (11/13 1351) BP: (140-155)/(71-74) 140/74 mmHg (11/13 1351) SpO2:  [95 %-100 %] 95 % (11/13 1351) Weight:  [  128 lb 8.5 oz (58.3 kg)] 128 lb 8.5 oz (58.3 kg) (11/13 0440)  Physical Exam: General: ao  ,answering questions HEENT: EOMI CVS tachycardical r,  no murmur rubs or gallops Chest: coarse breath sounds bilaterally Abdomen: softnondistended, normal bowel sounds, Skin: no rashes  Neuro:   Still profound LE weakness  CBC:  CBC Latest Ref Rng 02/18/2014 02/16/2014 02/15/2014  WBC 4.0 - 10.5 K/uL 5.5 6.0 6.1  Hemoglobin 12.0 - 15.0 g/dL 7.5(L) 8.1(L) 8.3(L)  Hematocrit 36.0 - 46.0 % 22.9(L) 25.3(L) 25.4(L)  Platelets 150 - 400 K/uL 181 215 269     BMET  Recent Labs  02/17/14 0544 02/18/14 0358  NA 145  143 144  K 4.2  4.1 3.9  CL  109  107 107  CO2 21  21 23   GLUCOSE 77  74 75  BUN 99*  101* 96*  CREATININE 3.18*  3.14* 2.92*  CALCIUM 8.4  8.5 8.4     Liver Panel   Recent Labs  02/17/14 0544 02/18/14 0358  PROT  --  5.1*  ALBUMIN 1.7* 1.9*  AST  --  20  ALT  --  36*  ALKPHOS  --  62  BILITOT  --  0.4       Sedimentation Rate No results for input(s): ESRSEDRATE in the last 72 hours. C-Reactive Protein No results for input(s): CRP in the last 72 hours.  Micro Results: Recent Results (from the past 720 hour(s))  Urine culture     Status: None   Collection Time: 01/27/14  3:26 PM  Result Value Ref Range Status   Specimen Description URINE, CATHETERIZED  Final   Special Requests NONE  Final   Culture  Setup Time   Final    01/27/2014 22:22 Performed at [place] Performed at [facility]  Final   Culture NO GROWTH Performed at [facility]  Final   Report Status 01/28/2014 FINAL  Final  Culture, blood (routine x 2)     Status: None   Collection Time: 01/29/14  9:19 AM  Result Value Ref Range Status   Specimen Description BLOOD LEFT ARM  Final   Special Requests BOTTLES DRAWN AEROBIC AND ANAEROBIC 10CC  Final   Culture  Setup Time   Final    01/29/2014 15:04 Performed at [place]   Final    NO GROWTH 5 DAYS Performed at [facility]   Report Status 02/04/2014 FINAL  Final  Culture, blood (routine x 2)     Status: None   Collection Time: 01/29/14  9:30 AM  Result Value Ref Range Status   Specimen Description BLOOD LEFT HAND  Final   Special Requests BOTTLES DRAWN AEROBIC AND ANAEROBIC 5CC  Final   Culture  Setup Time   Final    01/29/2014 15:04 Performed at [place]   Final    NO GROWTH 5 DAYS Performed at [facility]   Report Status 02/04/2014 FINAL  Final  Clostridium Difficile by PCR     Status: None   Collection Time: 01/29/14 11:10 AM  Result Value Ref  Range Status   C difficile by pcr NEGATIVE NEGATIVE Final  MRSA PCR Screening     Status: Abnormal   Collection Time: 01/29/14  3:40 PM  Result Value Ref Range Status   MRSA by PCR POSITIVE (A) NEGATIVE Final    Comment:  The GeneXpert MRSA Assay (FDA approved for NASAL specimens only), is one component of a comprehensive MRSA colonization surveillance program. It is not intended to diagnose MRSA infection nor to guide or monitor treatment for MRSA infections. RESULT CALLED TO, READ BACK BY AND VERIFIED WITH: Sherry Ruffing RN 17:50 01/29/14 ([email or web address])  AFB culture, blood     Status: None (Preliminary result)   Collection Time: 01/30/14 11:13 AM  Result Value Ref Range Status   Specimen Description BLOOD LEFT ARM  Final   Special Requests BOTTLES DRAWN AEROBIC ONLY [place]  Final   Culture   Final    CULTURE WILL BE EXAMINED FOR 6 WEEKS BEFORE ISSUING A FINAL REPORT Performed at [facility]   Report Status PENDING  Incomplete  GC/Chlamydia Probe Amp     Status: None   Collection Time: 01/30/14 11:30 AM  Result Value Ref Range Status   CT Probe RNA NEGATIVE NEGATIVE Final   GC Probe RNA NEGATIVE NEGATIVE Final    Comment: (NOTE)                                                                                       **Normal Reference Range: Negative**      Assay performed using the Gen-Probe APTIMA COMBO2 (R) Assay. Acceptable specimen types for this assay include APTIMA Swabs (Unisex, endocervical, urethral, or vaginal), first void urine, and ThinPrep liquid based cytology samples. Performed at [facility]  CSF culture     Status: None   Collection Time: 01/30/14  5:01 PM  Result Value Ref Range Status   Specimen Description CSF  Final   Special Requests NONE  Final   Gram Stain   Final    CYTOSPIN SLIDE WBC PRESENT, PREDOMINANTLY MONONUCLEAR NO ORGANISMS SEEN Performed at [facility] Performed at [facility]   Culture   Final    NO  GROWTH 3 DAYS Performed at [facility]   Report Status 02/03/2014 FINAL  Final  Gram stain     Status: None   Collection Time: 01/30/14  5:01 PM  Result Value Ref Range Status   Specimen Description CSF  Final   Special Requests NONE  Final   Gram Stain   Final    CYTOSPIN SLIDE WBC PRESENT, PREDOMINANTLY MONONUCLEAR NO ORGANISMS SEEN   Report Status 01/30/2014 FINAL  Final  Fungus culture, blood     Status: None   Collection Time: 02/01/14  2:00 PM  Result Value Ref Range Status   Specimen Description BLOOD LEFT HAND  Final   Special Requests BOTTLES DRAWN AEROBIC AND ANAEROBIC 10CC  Final   Culture   Final    NO GROWTH 7 DAYS Performed at [facility]    Report Status 02/09/2014 FINAL  Final  Culture, blood (routine x 2)     Status: None   Collection Time: 02/02/14 12:07 AM  Result Value Ref Range Status   Specimen Description BLOOD RIGHT HAND  Final   Special Requests BOTTLES DRAWN AEROBIC ONLY [facility]  Final   Culture  Setup Time   Final    02/02/2014 03:55 Performed at [facility]  Culture   Final    NO GROWTH 5 DAYS Performed at [facility]    Report Status 02/08/2014 FINAL  Final  Culture, blood (routine x 2)     Status: None   Collection Time: 02/02/14 12:18 AM  Result Value Ref Range Status   Specimen Description BLOOD LEFT HAND  Final   Special Requests BOTTLES DRAWN AEROBIC AND ANAEROBIC [facility] EACH  Final   Culture  Setup Time   Final    02/02/2014 03:55 Performed at [place]   Final    NO GROWTH 5 DAYS Performed at [facility]    Report Status 02/08/2014 FINAL  Final  Pneumocystis smear by DFA     Status: None   Collection Time: 02/03/14  2:30 PM  Result Value Ref Range Status   Specimen Source-PJSRC SPUTUM  Final   Pneumocystis jiroveci Ag POSITIVE  Final    Comment: Performed at [place] RN 02/04/14 1444 BY J.[email or web address]  Culture, respiratory  (NON-Expectorated)     Status: None   Collection Time: 02/04/14  3:50 AM  Result Value Ref Range Status   Specimen Description TRACHEAL ASPIRATE  Final   Special Requests Immunocompromised  Final   Gram Stain   Final    FEW WBC PRESENT,BOTH PMN AND MONONUCLEAR RARE SQUAMOUS EPITHELIAL CELLS PRESENT NO ORGANISMS SEEN Performed at [facility]   Culture   Final    NO GROWTH 2 DAYS Performed at [facility]   Report Status 02/06/2014 FINAL  Final  AFB culture with smear     Status: None (Preliminary result)   Collection Time: 02/04/14  3:50 AM  Result Value Ref Range Status   Specimen Description TRACHEAL ASPIRATE  Final   Special Requests NONE  Final   Acid Fast Smear   Final    1+ ACID FAST BACILLI SEEN CRITICAL RESULT CALLED TO, READ BACK BY AND VERIFIED WITH: CIARFI 14:55 110/30/15 FUENG CRITICAL RESULT CALLED TO, READ BACK BY AND VERIFIED WITH: LEE ANN STIMPSON H.D. 10:30 02/08/14 FUENG Performed at [facility]    Culture   Final    CULTURE WILL BE EXAMINED FOR 6 WEEKS BEFORE ISSUING A FINAL REPORT Performed at [facility]    Report Status PENDING  Incomplete  Fungus Culture with Smear     Status: None (Preliminary result)   Collection Time: 02/04/14  3:50 AM  Result Value Ref Range Status   Specimen Description TRACHEAL ASPIRATE  Final   Special Requests NONE  Final   Fungal Smear   Final    NO YEAST OR FUNGAL ELEMENTS SEEN Performed at [facility]    Culture   Final    CANDIDA ALBICANS Performed at [facility]    Report Status PENDING  Incomplete  Respiratory virus panel     Status: None   Collection Time: 02/04/14  3:50 AM  Result Value Ref Range Status   Source - RVPAN TRACHEAL ASPIRATE  Final   Respiratory Syncytial Virus A NOT DETECTED  Final   Respiratory Syncytial Virus B NOT DETECTED  Final   Influenza A NOT DETECTED  Final   Influenza B NOT DETECTED  Final   Parainfluenza 1 NOT DETECTED  Final    Parainfluenza 2 NOT DETECTED  Final   Parainfluenza 3 NOT DETECTED  Final   Metapneumovirus NOT DETECTED  Final   Rhinovirus NOT DETECTED  Final  Adenovirus NOT DETECTED  Final   Influenza A H1 NOT DETECTED  Final   Influenza A H3 NOT DETECTED  Final    Comment: (NOTE)       Normal Reference Range for each Analyte: NOT DETECTED Testing performed using the Luminex xTAG Respiratory Viral Panel test kit. The analytical performance characteristics of this assay have been determined by [facility].  The modifications have not been cleared or approved by the FDA. This assay has been validated pursuant to the CLIA regulations and is used for clinical purposes. Performed at [facility]. Tuberculosis complex by PCR     Status: None   Collection Time: 02/04/14  3:50 AM  Result Value Ref Range Status   M. tuberculosis, Direct Not detected Not detected Final    Comment: (NOTE) This test(s) was developed and its performance characteristics  have been determined by [employer] [facility],  [place], [place]. Performance characteristics refer to the analytical  performance of the test. This test should not be used as a  substitute for culture. It should be used as an adjunct to  culture.    Source (MTBPCR) Tracheal Aspirate  Corrected    Comment: Performed at [place] 11/10 AT 1314: PREVIOUSLY REPORTED AS TRACHEAL ASPIRATE   Culture, bal-quantitative     Status: None   Collection Time: 02/04/14 10:31 AM  Result Value Ref Range Status   Specimen Description BRONCHIAL ALVEOLAR LAVAGE  Final   Special Requests Immunocompromised  Final   Gram Stain   Final    FEW WBC PRESENT, PREDOMINANTLY MONONUCLEAR RARE SQUAMOUS EPITHELIAL CELLS PRESENT NO ORGANISMS SEEN Performed at [place] Performed at [facility]  Final   Culture   Final    NO GROWTH 2 DAYS Performed at [facility]    Report Status 02/06/2014 FINAL  Final  M. Tuberculosis complex by PCR     Status: None   Collection Time: 02/06/14  6:14 PM  Result Value Ref Range Status   M. tuberculosis, Direct Not detected Not detected Final    Comment: (NOTE) This test(s) was developed and its performance characteristics  have been determined by [facility],  [place], [place]. Performance characteristics refer to the analytical  performance of the test.    Source (MTBPCR) Sputum  Corrected    Comment: Performed at [place] 11/06 AT 0442: PREVIOUSLY REPORTED AS SPUTUM   AFB culture with smear     Status: None (Preliminary result)   Collection Time: 02/06/14  6:14 PM  Result Value Ref Range Status   Specimen Description SPUTUM  Final   Special Requests NONE  Final   Acid Fast Smear   Final    1+ ACID FAST BACILLI SEEN REFER TO PREVIOUS CULTURE [id number] FOR CALLS Performed at [facility]    Culture   Final    CULTURE WILL BE EXAMINED FOR 6 WEEKS BEFORE ISSUING A FINAL REPORT Performed at [facility]    Report Status PENDING  Incomplete  Culture, blood (routine x 2)     Status: None   Collection Time: 02/07/14  6:26 PM  Result Value Ref Range Status   Specimen Description BLOOD LEFT ARM  Final   Special Requests BOTTLES DRAWN AEROBIC AND ANAEROBIC 10CC  Final   Culture  Setup Time   Final    02/08/2014 02:25 Performed at [facility]  Culture   Final    NO GROWTH 5 DAYS Performed at [facility]    Report Status 02/15/2014 FINAL  Final  Culture, blood (routine x 2)     Status: None   Collection Time: 02/07/14  6:32 PM  Result Value Ref Range Status   Specimen Description BLOOD BLOOD LEFT FOREARM  Final   Special Requests BOTTLES DRAWN AEROBIC ONLY 10CC  Final   Culture  Setup Time   Final    02/08/2014 02:25 Performed at [place]   Final    NO GROWTH 5 DAYS Performed at [facility]    Report Status 02/15/2014 FINAL  Final  Clostridium Difficile by PCR     Status: None   Collection Time: 02/17/14  2:36 AM  Result Value Ref Range Status   C difficile by pcr NEGATIVE NEGATIVE Final    Studies/Results: Mr Lumbar Spine Wo Contrast  02/18/2014   CLINICAL DATA:  Weakness in bilateral lower extremities. Encephalopathy.  EXAM: MRI LUMBAR SPINE WITHOUT CONTRAST  TECHNIQUE: Multiplanar, multisequence MR imaging of the lumbar spine was performed. No intravenous contrast was administered.  COMPARISON:  None.  FINDINGS: Examination is quite limited due to patient's condition. He would not cooperate with any further imaging.  The T2 sagittal sequence demonstrates normal alignment of the lumbar vertebral bodies. They demonstrate grossly normal signal intensity. The intervertebral disc spaces are maintained with normal T2 signal intensity. The conus medullaris terminates at L1. The spinal canal is quite generous. There are no disc protrusions, spinal or foraminal stenosis. No findings for diskitis or osteomyelitis. No findings for septic facet arthritis.  There is diffuse edema like signal abnormality in paraspinal muscles which is nonspecific myositis. This could be inflammatory such as polymyositis or dermatomyositis, posttraumatic, drug induced, etc.  IMPRESSION: Normal alignment of the lumbar vertebral bodies and no acute bony findings.  No disc protrusions, spinal or foraminal stenosis.  Nonspecific edema like signal abnormality in the paraspinal muscles as discussed above.   Electronically Signed   By: Kalman Jewels M.D.   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 SHE HAS PROFOUND LOWER EXTREMITY WEAKNESS >> UPPER EXTREMITY WEAKNESS as is documented more definitively in PT notes from yesterday and today  #1 Lower Extremity weakness: MRI spine clean, if fails to improve would consider MRI brain  #2 HSV 2 encephalitis: HSV1 more typical to cause encephalitis. I cannot explain focal weakness by HSV2 encephalitis.  Can stop acyclovir after today  #3 PCP Pneumonia: continue clindamycin Primaquine and steroids for 21 day course And then change to dapsone 146m daily  She should have G6PD level checked  #4 Renal failure :started ARV, renal failure likely multifactorial. Cr improving   #5  HIV/AIDS: started her on ARV regimen  Abacavir liquid, Epivir liquid and Tivicay   -SHE NEEDS URGENT ADAP APPROVAL, I HAVE ASKED [facility] [facility]SEL[facility] and our FINANCIAL ASSISTANT HAS COME TO BEDSIDE YESTERDAY WITH [place] TO FILL OUT PAPERWORK I WILL SEE IF I NEED TO MAKE PHONE CALL TO STATE TO EXPEDITE APPLICATION  We had extensive discussion today with both her former husband and her daughter. She wants to be followed in [place] and claims that she is a patient at [facility] though I cannot find record of her being there via care everywhere. Certainly I think we should see her for the first 1-2 visits post hospital care and likely longer if she genuinely has not yet established care at [facility].  I spent greater than 40 minutes with the patient including greater than 50% of time in face to face counsel of the patient and in coordination of their care.  I will set the patient up  with a [facility]SF[facility] appt with [place]  Otherwise Dr. Linus Salmons is available for questions over the weekend and Dr. Baxter Flattery will be here on Monday      LOS: 23 days   Alcide Evener 02/19/2014, 3:22 PM

## 2014-02-19 NOTE — Plan of Care (Signed)
Problem: Phase I Progression Outcomes Goal: OOB as tolerated unless otherwise ordered Outcome: Completed/Met Date Met:  02/19/14 Goal: Other Phase I Outcomes/Goals Outcome: Not Applicable Date Met:  42/68/34  Problem: Phase II Progression Outcomes Goal: Other Phase II Outcomes/Goals Outcome: Not Applicable Date Met:  19/62/22  Problem: Phase III Progression Outcomes Goal: Other Phase III Outcomes/Goals Outcome: Not Applicable Date Met:  97/98/92  Problem: Discharge Progression Outcomes Goal: Tolerating diet Outcome: Completed/Met Date Met:  02/19/14 Goal: Other Discharge Outcomes/Goals Outcome: Not Applicable Date Met:  11/94/17

## 2014-02-19 NOTE — Progress Notes (Signed)
PROGRESS NOTE  Desiree Hale ZOX:[id number] DOB: 19-Jan-1971 DOA: 01/27/2014 PCP: No primary care provider on file.  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 She was initially admitted with acute encephalopathy on the hospitalist service, then decompensated from a respiratory standpoint and was intubated 10/28. Patient's respiratory status has gradually improved and was able to be extubated on 11/5, and currently she is comfortable on room air. Her course was complicated by acute renal failure thought to be multifactorial due to nephrotoxic agents, ATN.  Her acute encephalopathy has been very slow to improve and she has intermittent periods of confusion alternating with alertness. On 11/10 her cognitive ability was significantly improved.  On 11/13 I believe she is at baseline.  The patient is waiting for ADAP approval.  Marthann Schiller [phone]) of [facility] is her [employer] and is following the approval process.  Infectious disease, nephrology, PCCM and as well as palliative care have been involved in her care.  Subjective/ 24 H Interval events Patient has no complaints.  Discusses her views about Dorinda [place] Trump's presidential campaign.  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  Patient intubated from 10/28 until 11/5.  Now breathing comfortably on room air.  CXR 11/5 clear  Pneumocystis pneumonia - continue antibiotics (11/13 is day 16/21).  Patient is now asymptomatic. Discontinued prednisone after 11/13.  She is on clindamycin, primaquine.  Being followed by ID. Per Dr. Algis Liming, when antibiotics are complete start Dapsone 100 mg daily to continue indefinitely.  Patient should follow up with his office one time after discharge. Then she prefers to follow in [facility].  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  11/13 is the final day of IV acyclovir.  Mental status much improved but patient still appears to lack insight and capacity.    Lower Extremity Weakness - patient with profound weakness in her LEs.  Unable to left legs against gravity.  PT Eval completed recommended SNF.   MRI of the spine showed nonspecific perispinal edema.  Current plan is to D/C to SNF as patient is unable to bear her own weight and very dependent on assistance with ADLs.  Elevated troponin level - in the setting of demand ischemia and acute renal failure. Patient denies chest pain.  Acute on chronic diastolic heart failure - On beta blocker.  No sign of volume overload.   Acute on chronic renal failure with metabolic acidosis - Likely ATN from antibioitics with possible ischemia from hypotension.  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  BUN/CR is down trending (2.92).  Bicarb has now normalized.  Nephrology has followed her 2x during this hospitalization and has signed off.  Protein calorie malnutrition - nutrition consulted.  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  Patient first learned of her HIV diagnosis in 2008.  On fluconazole weekly.  ID working to gain urgent ADAP  approval.  Need to gain this prior to discharge.    Seizure disorder - stable.  She is on Keppra  Hypertension - moderately controlled.  Added norvasc 11/11.   Continue labetalol   Diet: regular Fluids: none  DVT Prophylaxis: heparin  Code Status: Full Family Communication: Spoke with patient who is alert, orientated, and agreeable [place] Disposition Plan: Plan for D/C to SNF  Consultants:  ID  Nephrology  PCCM  Palliative  Psychiatry  Procedures:   10/22 EEG >>> normal drowsy and asleep electroencephalogram. No epileptiform activity is noted.  10/22 ECHO >>> EF 55-60%, grade 1 diastolic dysfunction, no RWMA, small free-flowing pericardial effusion 10/28 Worsening tachypnea, AMS, tx ICU. Required intubation  10/30 Renal US >> kidneys diffusely echogenic, concerning for medical renal disease 10/31 received 1 unit PRBC's  11/5: Extubated   Antibiotics Anti-infectives    Start     Dose/Rate Route Frequency Ordered Stop   02/20/14 1000  fluconazole (DIFLUCAN) 40 MG/ML suspension 100 mg     100 mg Per Tube Weekly 02/13/14 1213     02/17/14 0000  azithromycin (ZITHROMAX) tablet 1,200 mg     1,200 mg Oral Weekly 02/15/14 1309     02/15/14 1400  clindamycin (CLEOCIN) capsule 600 mg     600 mg Oral 3 times per day 02/15/14 1311     02/13/14 1300  azithromycin (ZITHROMAX) tablet 500 mg  Status:  Discontinued     500 mg Oral Daily 02/13/14 1206 02/15/14 1240   02/09/14 1000  lamiVUDine (EPIVIR) 10 MG/ML solution 100 mg     100 mg Oral Daily 02/08/14 1149     02/08/14 1600  azithromycin (ZITHROMAX) tablet 1,200 mg  Status:  Discontinued     1,200 mg Per Tube Weekly 02/03/14 0955 02/03/14 1012   02/08/14 1300  abacavir (ZIAGEN) tablet 600 mg     600 mg Per NG tube Daily 02/08/14 1115     02/08/14 1200  lamiVUDine (EPIVIR) 10 MG/ML solution 150 mg     150 mg Per Tube  Once 02/08/14 1115 02/08/14 1320   02/08/14 1200  dolutegravir (TIVICAY) tablet  50 mg    Comments:  Per NG TUBE   50 mg Oral 2 times daily 02/08/14 1115     02/08/14 1000  azithromycin (ZITHROMAX) 200 MG/5ML suspension 1,200 mg  Status:  Discontinued     1,200 mg Oral Weekly 02/03/14 1013 02/03/14 1015   02/08/14 1000  azithromycin (ZITHROMAX) 200 MG/5ML suspension 1,200 mg  Status:  Discontinued     1,200 mg Per Tube Weekly 02/03/14 1015 02/05/14 1542   02/06/14 1200  acyclovir (ZOVIRAX) 470 mg in dextrose 5 % 100 mL IVPB     10 mg/kg  47 kg109.4 mL/hr over 60 Minutes Intravenous Every 24 hours 02/06/14 1110     02/05/14 2200  clindamycin (CLEOCIN) IVPB 600 mg  Status:  Discontinued     600 mg100 mL/hr over 30 Minutes Intravenous 3 times per day 02/05/14 1542 02/15/14 1311   02/05/14 2200  primaquine tablet 30 mg     30 mg Per Tube Every 24 hours 02/05/14 1542     02/05/14 1700  azithromycin (ZITHROMAX) 200 MG/5ML suspension 600 mg  Status:  Discontinued     600 mg Per Tube Daily  02/05/14 1542 02/13/14 1206   02/05/14 1700  ethambutol (MYAMBUTOL) tablet 800 mg  Status:  Discontinued     800 mg Oral Every 24 hours 02/05/14 1542 02/05/14 1545   02/05/14 1700  ethambutol (MYAMBUTOL) tablet 800 mg  Status:  Discontinued     800 mg Per Tube Every 24 hours 02/05/14 1545 02/15/14 1240   02/05/14 1600  rifabutin (MYCOBUTIN) capsule 150 mg  Status:  Discontinued     150 mg Oral Daily 02/05/14 1542 02/15/14 1240   02/05/14 1200  acyclovir (ZOVIRAX) 450 mg in dextrose 5 % 100 mL IVPB  Status:  Discontinued     10 mg/kg  45 kg109 mL/hr over 60 Minutes Intravenous Every 24 hours 02/04/14 1554 02/04/14 1601   02/05/14 1200  acyclovir (ZOVIRAX) 400 mg in dextrose 5 % 100 mL IVPB  Status:  Discontinued     10 mg/kg  40 kg (Order-Specific)108 mL/hr over 60 Minutes Intravenous Every 24 hours 02/04/14 1601 02/06/14 1110   02/05/14 1200  sulfamethoxazole-trimethoprim (BACTRIM) 240 mg of trimethoprim in dextrose 5 % 250 mL IVPB  Status:  Discontinued     240 mg of trimethoprim265 mL/hr  over 60 Minutes Intravenous Every 12 hours 02/05/14 1156 02/05/14 1510   02/05/14 1000  fluconazole (DIFLUCAN) 40 MG/ML suspension 100 mg  Status:  Discontinued     100 mg Per Tube Daily 02/04/14 1759 02/13/14 1213   02/04/14 2000  piperacillin-tazobactam (ZOSYN) IVPB 2.25 g     2.25 g100 mL/hr over 30 Minutes Intravenous Every 8 hours 02/04/14 1613 02/04/14 2121   02/04/14 1100  ganciclovir (CYTOVENE) 55 mg in sodium chloride 0.9 % 100 mL IVPB  Status:  Discontinued     1.25 mg/kg  45 kg100 mL/hr over 60 Minutes Intravenous Every 24 hours 02/04/14 1007 02/04/14 1450   02/04/14 1000  fluconazole (DIFLUCAN) tablet 400 mg  Status:  Discontinued     400 mg Per Tube Daily 02/03/14 0955 02/03/14 1015   02/04/14 1000  fluconazole (DIFLUCAN) 40 MG/ML suspension 400 mg  Status:  Discontinued     400 mg Per Tube Daily 02/03/14 1015 02/03/14 1136   02/04/14 1000  fluconazole (DIFLUCAN) 40 MG/ML suspension 200 mg  Status:  Discontinued     200 mg Per Tube Daily 02/03/14 1136 02/04/14 1759   02/04/14 1000  acyclovir (ZOVIRAX) 400 mg in dextrose 5 % 100 mL IVPB  Status:  Discontinued     10 mg/kg  40 kg108 mL/hr over 60 Minutes Intravenous Every 24 hours 02/03/14 1412 02/04/14 0952   02/04/14 0000  sulfamethoxazole-trimethoprim (BACTRIM) 200 mg of trimethoprim in dextrose 5 % 250 mL IVPB  Status:  Discontinued     5 mg/kg of trimethoprim  40 kg262.5 mL/hr over 60 Minutes Intravenous Every 12 hours 02/03/14 1412 02/05/14 1156   02/03/14 2000  piperacillin-tazobactam (ZOSYN) IVPB 2.25 g  Status:  Discontinued     2.25 g100 mL/hr over 30 Minutes Intravenous Every 8 hours 02/03/14 1412 02/04/14 1613   02/02/14 1400  sulfamethoxazole-trimethoprim (BACTRIM) 200 mg in dextrose 5 % 250 mL IVPB  Status:  Discontinued     15 mg/kg/day  40 kg262.5 mL/hr over 60 Minutes Intravenous 3 times per day 02/02/14 1354 02/03/14 1412   02/02/14 1300  fluconazole (DIFLUCAN) tablet 400 mg  Status:  Discontinued     400 mg  Oral Daily 02/02/14 1026 02/03/14 0955   02/01/14 1700  sulfamethoxazole-trimethoprim (BACTRIM DS) 800-160 MG per tablet 1 tablet  Status:  Discontinued     1 tablet Oral Once per day on Mon Wed Fri 02/01/14 1549 02/02/14 1316   02/01/14 1600  azithromycin (ZITHROMAX) tablet 1,200 mg  Status:  Discontinued     1,200 mg Oral Weekly 02/01/14 1515 02/03/14 0955   02/01/14 1000  acyclovir (ZOVIRAX) 420 mg in dextrose 5 % 100 mL IVPB  Status:  Discontinued     10 mg/kg  41.9 kg108.4 mL/hr over 60 Minutes Intravenous Every 12 hours 02/01/14 0909 02/03/14 1412   02/01/14 1000  vancomycin (VANCOCIN) IVPB 750 mg/150 ml premix  Status:  Discontinued     750 mg150 mL/hr over 60 Minutes Intravenous Every 24 hours 02/01/14 0910 02/03/14 0910   02/01/14 0823  vancomycin (VANCOCIN) 500 mg in sodium chloride 0.9 % 100 mL IVPB  Status:  Discontinued     500 mg100 mL/hr over 60 Minutes Intravenous Every 24 hours 01/31/14 1029 02/01/14 0909   01/31/14 1200  piperacillin-tazobactam (ZOSYN) IVPB 3.375 g  Status:  Discontinued     3.375 g12.5 mL/hr over 240 Minutes Intravenous Every 8 hours 01/31/14 1029 02/03/14 1412   01/30/14 2100  acyclovir (ZOVIRAX) 390 mg in dextrose 5 % 100 mL IVPB  Status:  Discontinued     10 mg/kg  39.1 kg107.8 mL/hr over 60 Minutes Intravenous Every 24 hours 01/30/14 2018 02/01/14 0908   01/29/14 1600  acyclovir (ZOVIRAX) 420 mg in dextrose 5 % 100 mL IVPB  Status:  Discontinued     420 mg108.4 mL/hr over 60 Minutes Intravenous Every 24 hours 01/29/14 1457 01/29/14 2015   01/29/14 0800  cefTRIAXone (ROCEPHIN) 1 g in dextrose 5 % 50 mL IVPB  Status:  Discontinued     1 g100 mL/hr over 30 Minutes Intravenous Every 24 hours 01/29/14 0707 01/29/14 0710   01/29/14 0800  vancomycin (VANCOCIN) 500 mg in sodium chloride 0.9 % 100 mL IVPB  Status:  Discontinued     500 mg100 mL/hr over 60 Minutes Intravenous Every 48 hours 01/29/14 0719 01/31/14 1029   01/29/14 0730  piperacillin-tazobactam  (ZOSYN) IVPB 2.25 g  Status:  Discontinued     2.25 g100 mL/hr over 30 Minutes Intravenous 4 times per day 01/29/14 0719 01/31/14 1028   01/29/14 0715  azithromycin (ZITHROMAX) 500 mg in dextrose 5 % 250 mL IVPB  Status:  Discontinued     500 mg250 mL/hr over 60 Minutes Intravenous Every 24 hours 01/29/14 0704 01/29/14 0710        Studies  CXR 11/5 There is no evidence of pneumonia nor other acute cardiopulmonary abnormality.  Objective  Filed Vitals:   02/18/14 2131 02/19/14 0440 02/19/14 0624 02/19/14 1351  BP:   155/71 140/74  Pulse: 74  68 93  Temp: 98.4 F (36.9 C)  98.4 F (36.9 C) 99.2 F (37.3 C)  TempSrc: Oral  Oral Oral  Resp: 18  16 20   Height:      Weight:  58.3 kg (128 lb 8.5 oz)    SpO2: 98%  100% 95%    Intake/Output Summary (Last 24 hours) at 02/19/14 1438 Last data filed at 02/19/14 0443  Gross per 24 hour  Intake    726 ml  Output    375 ml  Net    351 ml   Filed Weights   02/17/14 0529 02/18/14 0420 02/19/14 0440  Weight: 56.1 kg (123 lb 10.9 oz) 56.1 kg (123 lb 10.9 oz) 58.3 kg (128 lb 8.5 oz)    Exam:  General:  Wd, AA female, lying in bed. NAD.  Comfortable. pleasant  Cardiovascular: RRR, no frank m/r/g, no lower extremity edema  Respiratory: CTA biL on anterior auscultation, no accessory muscle use.  Abdomen: very thin, soft, nontender, nd, +bs, no masses  MSK: no peripheral edema, unable to lift LE against gravity.    Psych: Appropriate, cooperative. Mildly confused today.   Data Reviewed: Basic Metabolic Panel:  Recent Labs Lab 02/13/14 0412 02/13/14 0415 02/14/14 0605 02/15/14 0525 02/16/14 0706 02/17/14 0544 02/18/14 0358  NA 138  --  142 143 142 145  143 144  K 4.5  --  4.3 4.7 4.4 4.2  4.1 3.9  CL 99  --  103 103 105 109  107 107  CO2 25  --  23 22 21 21  21 23   GLUCOSE 107*  --  90 105* 75 77  74 75  BUN 105*  --  113* 119* 110* 99*  101* 96*  CREATININE 2.94*  --  3.01* 3.16* 3.16* 3.18*  3.14* 2.92*    CALCIUM 8.5  --  8.8 8.6 8.7 8.4  8.5 8.4  MG  --  2.1 2.0 2.0  --   --   --   PHOS 6.6*  --  7.8*  --  8.3* 7.1*  --    Liver Function Tests:  Recent Labs Lab 02/13/14 0412 02/14/14 0605 02/16/14 0706 02/17/14 0544 02/18/14 0358  AST  --   --   --   --  20  ALT  --   --   --   --  36*  ALKPHOS  --   --   --   --  62  BILITOT  --   --   --   --  0.4  PROT  --   --   --   --  5.1*  ALBUMIN 1.7* 1.7* 1.8* 1.7* 1.9*   CBC:  Recent Labs Lab 02/13/14 0415 02/14/14 0605 02/15/14 0525 02/16/14 0706 02/18/14 0358  WBC 6.3 5.4 6.1 6.0 5.5  HGB 9.0* 8.9* 8.3* 8.1* 7.5*  HCT 27.0* 27.1* 25.4* 25.3* 22.9*  MCV 76.9* 78.8 78.9 81.6 79.5  PLT 306 294 269 215 181   CBG:  Recent Labs Lab 02/18/14 2018 02/19/14 0022 02/19/14 0352 02/19/14 0805 02/19/14 1201  GLUCAP 146* 102* 102* 158* 135*    Recent Results (from the past 240 hour(s))  Clostridium Difficile by PCR     Status: None   Collection Time: 02/17/14  2:36 AM  Result Value Ref Range Status   C difficile by pcr NEGATIVE NEGATIVE Final     Scheduled Meds: . abacavir  600 mg Per NG tube Daily  . acyclovir  10 mg/kg Intravenous Q24H  . amLODipine  5 mg Oral Daily  . aspirin  81 mg Oral Daily  . azithromycin  1,200 mg Oral Weekly  . clindamycin  600 mg Oral 3 times per day  . dolutegravir  50 mg Oral BID  . feeding supplement (ENSURE COMPLETE)  237 mL Oral BID BM  . feeding supplement (PRO-STAT SUGAR FREE 64)  30 mL Oral BID BM  . [START ON 02/20/2014] fluconazole  100 mg Per Tube Weekly  . heparin subcutaneous  5,000 Units Subcutaneous 3 times per day  . iron polysaccharides  150 mg Oral Daily  . labetalol  200 mg Oral BID  . lamiVUDine  100 mg Oral Daily  . levETIRAcetam  500 mg Oral BID  . pantoprazole  40 mg  Oral Daily  . predniSONE  20 mg Oral Q breakfast  . primaquine  30 mg Per Tube Q24H   Continuous Infusions:   Time spent: 25 minutes  Algis Downs, [place] [facility] Pager [phone].  If 7 PM - 7 AM, please contact night-coverage at [email or web address], password [facility] 02/19/2014, 2:38 PM  LOS: 23 days

## 2014-02-19 NOTE — Progress Notes (Addendum)
Pt state 8 out of 10 sacral pain at sacral stage 2 ulcer when pt is turned or clean up. Pt has no prns ordered. [facility]. One time pain pill ordered.

## 2014-02-19 NOTE — Clinical Social Work Note (Addendum)
Patient only agreeable to SNF placement [place]. CSW has sent referrals to all [facility] facilities.  [facility]: Message left, no return call at this time. [facility]: Message left, no return call at this time. [facility]: No bed available [facility]: No Medicaid [facility]: Message left, no return call at this time. [facility]: Message left, no return call at this time. [facility]: Waiting for decision. [facility]: No Medicaid Beds. [facility]: No Medicaid Beds. [place]: No Medicaid Beds. [facility]: Message left, no return call at this time. [facility]: No Medicaid Beds available. [facility]: Message left, waiting for decision. Patient states she will not go to this place. [facility]: No [facility]. [facility]: Waiting for decision. Update: Facility states "we are not able to meet her needs."   CSW has explained to patient that there is currently not a bed available [place] [facility]. CSW marked the facilities on the patient's SNF list that could potentially become an option for the patient in the future (in red above). Patient states that she and her husband discussed the situation and they have decided that they want to return home if the patient cannot get a bed [place]. CSW explained to patient that CSW would notify RNCM of home health needs. CSW has spoken with patient's husband Ethelene Brownsnthony. Ethelene Brownsnthony was appreciative of CSW's efforts in finding a bed [place] [facility], and states that the family will be prepared to bring her home. Treatment team would like to see that patient has her medications in hand prior to leaving the hospital. Patient does have Medicaid. CSW has left message with RNCM to see if it is possible for patient to get medications under her Medicaid. CSW signing off at this time.

## 2014-02-19 NOTE — Plan of Care (Signed)
Problem: Consults Goal: General Medical Patient Education See Patient Education Module for specific education.  Outcome: Completed/Met Date Met:  02/19/14  Problem: Phase I Progression Outcomes Goal: Pain controlled with appropriate interventions Outcome: Completed/Met Date Met:  02/19/14 Goal: Initial discharge plan identified Outcome: Progressing Goal: Voiding-avoid urinary catheter unless indicated Outcome: Completed/Met Date Met:  02/19/14 Goal: Hemodynamically stable Outcome: Completed/Met Date Met:  02/19/14  Problem: Phase II Progression Outcomes Goal: Discharge plan established Outcome: Progressing Goal: Vital signs remain stable Outcome: Completed/Met Date Met:  02/19/14 Goal: IV changed to normal saline lock Outcome: Completed/Met Date Met:  02/19/14 Goal: Obtain order to discontinue catheter if appropriate Outcome: Not Applicable Date Met:  84/53/64  Problem: Phase III Progression Outcomes Goal: Pain controlled on oral analgesia Outcome: Completed/Met Date Met:  02/19/14 Goal: Foley discontinued Outcome: Not Applicable Date Met:  68/03/21

## 2014-02-19 NOTE — Clinical Social Work Note (Signed)
Per PA, patient is now agreeable to SNF placement at this time. CSW will continue SNF search, as there are no [place]. Per PA, [place] [facility] [place]. CSW will assist.   Roddie McBryant Debbe Crumble MSW, [place], [place], [phone]

## 2014-02-20 LAB — GLUCOSE, CAPILLARY
GLUCOSE-CAPILLARY: 94 mg/dL (ref 70–99)
GLUCOSE-CAPILLARY: 109 mg/dL — AB (ref 70–99)
Glucose-Capillary: 114 mg/dL — ABNORMAL HIGH (ref 70–99)
Glucose-Capillary: 85 mg/dL (ref 70–99)
Glucose-Capillary: 91 mg/dL (ref 70–99)
Glucose-Capillary: 95 mg/dL (ref 70–99)

## 2014-02-20 MED ORDER — HYDROCODONE-ACETAMINOPHEN 5-325 MG PO TABS
1.0000 | ORAL_TABLET | Freq: Once | ORAL | Status: AC
  Administered 2014-02-20: 1 via ORAL
  Filled 2014-02-20: qty 1

## 2014-02-20 MED ORDER — HYDROCODONE-ACETAMINOPHEN 5-325 MG PO TABS
1.0000 | ORAL_TABLET | Freq: Four times a day (QID) | ORAL | Status: DC | PRN
  Administered 2014-02-20 – 2014-02-23 (×9): 1 via ORAL
  Filled 2014-02-20 (×9): qty 1

## 2014-02-20 MED ORDER — AMLODIPINE BESYLATE 10 MG PO TABS
10.0000 mg | ORAL_TABLET | Freq: Every day | ORAL | Status: DC
  Administered 2014-02-21 – 2014-02-23 (×3): 10 mg via ORAL
  Filled 2014-02-20 (×3): qty 1

## 2014-02-20 MED ORDER — CETYLPYRIDINIUM CHLORIDE 0.05 % MT LIQD
7.0000 mL | Freq: Two times a day (BID) | OROMUCOSAL | Status: DC
  Administered 2014-02-21 – 2014-02-22 (×3): 7 mL via OROMUCOSAL

## 2014-02-20 NOTE — Progress Notes (Signed)
PROGRESS NOTE  Desiree Hale ZOX:[id number]:[id number] DOB: 1971-01-28 DOA: 01/27/2014 PCP: No primary care provider on file.  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 She was initially admitted with acute encephalopathy on the hospitalist service, then decompensated from a respiratory standpoint and was intubated 10/28. Patient's respiratory status has gradually improved and was able to be extubated on 11/5, and currently she is comfortable on room air. Her course was complicated by acute renal failure thought to be multifactorial due to nephrotoxic agents, ATN. Her acute encephalopathy has been very slow to improve and she has intermittent periods of confusion alternating with alertness. On 11/10 her cognitive ability was significantly improved. On 11/13 I believe she is at baseline. The patient is waiting for ADAP approval. Marthann SchillerMitch [phone]) [facility] is her [employer] Counselor and is following the approval process. Infectious disease, nephrology, PCCM and as well as palliative care have been involved in her care. Assessment/Plan: Acute hypoxemic respiratory failure Resolved. 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 Patient intubated from 10/28 until 11/5. Now breathing comfortably on room air. CXR 11/5 clear  Pneumocystis pneumonia - continue antibiotics (11/14 is day 17/21). Patient is now asymptomatic. Discontinued prednisone after 11/13. She is on clindamycin, primaquine. Being followed by ID. Per Dr. Algis LimingVanDam, when antibiotics are complete start Dapsone 100 mg daily to continue indefinitely. Patient should follow up with his office one time after discharge. [facility]  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 11/13 is the final day of IV acyclovir. Mental status much  improved but patient still appears to lack insight and capacity.  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 -Unable to left legs against gravity.  PT Eval completed recommended SNF.  -MRI of the spine showed nonspecific perispinal edema. Current plan is to D/C to SNF as patient is unable to bear her own weight and very dependent on assistance with ADLs.  Elevated troponin level - in the setting of demand ischemia and acute renal failure. Patient denies chest pain.  Acute on chronic diastolic heart failure - On beta blocker. No sign of volume overload presently  Acute on chronic renal failure with metabolic acidosis - Likely ATN from antibioitics with possible ischemia from hypotension. 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 BUN/CR is down trending (2.92). Bicarb has now normalized. Nephrology has followed her 2x during this hospitalization and has signed off.  Protein calorie malnutrition - nutrition consulted.  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 Patient first learned of her HIV diagnosis in 2008. On fluconazole weekly. ID working to gain urgent ADAP approval. Need to gain this prior to discharge.  -We will need to have confirmation of ADAP approval or confirmation that she can get antiretrovirals prior to d/c -anticipate 02/22/14 d/c Seizure disorder - stable. She is on Keppra  Hypertension - moderately controlled. Added norvasc 11/11.  -increase amlodipine to 10mg  daily -continue Normodyne         Procedures/Studies: Ct Head Wo Contrast  01/27/2014   CLINICAL DATA:  Found unresponsive.  Altered mental status.  EXAM: CT HEAD WITHOUT  CONTRAST  CT CERVICAL SPINE WITHOUT CONTRAST  TECHNIQUE: Multidetector CT imaging of the head and cervical spine was performed following the standard protocol without intravenous contrast. Multiplanar CT image reconstructions of the cervical spine were also generated.  COMPARISON:  None.  FINDINGS: CT HEAD FINDINGS  The ventricles are normal in size and configuration. No extra-axial fluid collections are identified. The gray-white differentiation is normal. No CT findings for acute intracranial process such as hemorrhage or infarction. No mass lesions. The brainstem and cerebellum are grossly normal.  The bony structures are intact. The paranasal sinuses and mastoid air cells are clear. The globes are intact.  CT CERVICAL SPINE FINDINGS  Normal alignment of the cervical vertebral bodies. Disc spaces and vertebral bodies are maintained. No acute fracture or abnormal prevertebral soft tissue swelling. The facets are normally aligned. The skullbase C1 and C1-2 articulations are maintained. The dens is normal. No large disc protrusions, spinal or foraminal stenosis. The lung apices are clear. Emphysematous changes are noted. There is some air in the supraclavicular fossa possibly due to a laceration. No pneumothorax is identified.  IMPRESSION: No acute intracranial findings or skull fracture.  Normal CT examination of the cervical spine. No cervical spine fracture.   Electronically Signed   By: Loralie Champagne M.D.   On: 01/27/2014 17:27   Ct Cervical Spine Wo Contrast  01/27/2014   CLINICAL DATA:  Found unresponsive.  Altered mental status.  EXAM: CT HEAD WITHOUT CONTRAST  CT CERVICAL SPINE WITHOUT CONTRAST  TECHNIQUE: Multidetector CT imaging of the head and cervical spine was performed following the standard protocol without intravenous contrast. Multiplanar CT image reconstructions of the cervical spine were also generated.  COMPARISON:  None.  FINDINGS: CT HEAD FINDINGS  The ventricles are normal in size and  configuration. No extra-axial fluid collections are identified. The gray-white differentiation is normal. No CT findings for acute intracranial process such as hemorrhage or infarction. No mass lesions. The brainstem and cerebellum are grossly normal.  The bony structures are intact. The paranasal sinuses and mastoid air cells are clear. The globes are intact.  CT CERVICAL SPINE FINDINGS  Normal alignment of the cervical vertebral bodies. Disc spaces and vertebral bodies are maintained. No acute fracture or abnormal prevertebral soft tissue swelling. The facets are normally aligned. The skullbase C1 and C1-2 articulations are maintained. The dens is normal. No large disc protrusions, spinal or foraminal stenosis. The lung apices are clear. Emphysematous changes are noted. There is some air in the supraclavicular fossa possibly due to a laceration. No pneumothorax is identified.  IMPRESSION: No acute intracranial findings or skull fracture.  Normal CT examination of the cervical spine. No cervical spine fracture.   Electronically Signed   By: Loralie Champagne M.D.   On: 01/27/2014 17:27   Mr Brain Wo Contrast  01/28/2014   CLINICAL DATA:  Unresponsive. The patient had an episode with loss of consciousness. She has been aphasic since that episode despite regaining who consciousness otherwise.  EXAM: MRI HEAD WITHOUT CONTRAST  TECHNIQUE: Multiplanar, multiecho pulse sequences of the brain and surrounding structures were obtained without intravenous contrast.  COMPARISON:  CT head without contrast 01/27/2014.  FINDINGS: Abnormal cortical and subcortical T2 hyperintensity is evident in the posterior insular cortex and along the sylvian fissure bilaterally. There is abnormal signal extending into the operculum bilaterally, left greater than right. This signal abnormality extends nearly to the vertex within the precentral sulcus. There is no significant associated restricted diffusion.  No hemorrhage or mass lesion is  present. There is no significant mass effect.  Flow is present in the major intracranial arteries. The globes and orbits are intact. Mild mucosal thickening is noted in the ethmoid air cells and sphenoid sinuses bilaterally.  The coronal T2 weighted images are somewhat degraded by patient motion. No definite hippocampal abnormality is evident.  IMPRESSION: 1. Diffuse cortical and subcortical signal abnormality bilaterally within the temporal and posterior frontal lobes as described. This is most likely a postictal phenomenon. There is no restricted diffusion to suggest acute or subacute ischemia. No mass lesion is evident. This could be related to elevated blood pressure similar to posterior reversible encephalopathy syndrome. The distribution is atypical. Recommend follow-up MRI of the brain without and with contrast in 2-3 weeks as the symptoms resolve. 2. Minimal sinus disease.   Electronically Signed   By: Gennette Pac M.D.   On: 01/28/2014 10:27   Mr Lumbar Spine Wo Contrast  02/18/2014   CLINICAL DATA:  Weakness in bilateral lower extremities. Encephalopathy.  EXAM: MRI LUMBAR SPINE WITHOUT CONTRAST  TECHNIQUE: Multiplanar, multisequence MR imaging of the lumbar spine was performed. No intravenous contrast was administered.  COMPARISON:  None.  FINDINGS: Examination is quite limited due to patient's condition. He would not cooperate with any further imaging.  The T2 sagittal sequence demonstrates normal alignment of the lumbar vertebral bodies. They demonstrate grossly normal signal intensity. The intervertebral disc spaces are maintained with normal T2 signal intensity. The conus medullaris terminates at L1. The spinal canal is quite generous. There are no disc protrusions, spinal or foraminal stenosis. No findings for diskitis or osteomyelitis. No findings for septic facet arthritis.  There is diffuse edema like signal abnormality in paraspinal muscles which is nonspecific myositis. This could be  inflammatory such as polymyositis or dermatomyositis, posttraumatic, drug induced, etc.  IMPRESSION: Normal alignment of the lumbar vertebral bodies and no acute bony findings.  No disc protrusions, spinal or foraminal stenosis.  Nonspecific edema like signal abnormality in the paraspinal muscles as discussed above.   Electronically Signed   By: Loralie Champagne M.D.   On: 02/18/2014 17:27   US Renal  02/05/2014   CLINICAL DATA:  Acute renal insufficiency  EXAM: RENAL/URINARY TRACT ULTRASOUND COMPLETE  COMPARISON:  None.  FINDINGS: Right Kidney:  Length: 10.0 cm. Echogenicity is diffusely increased. The renal cortical thickness is normal. There is no appreciable mass or perinephric fluid. There is slight fullness of the renal collecting system without focal obstruction site seen. There is no sonographically demonstrable calculus or ureterectasis.  Left Kidney:  Length: 10.4 cm. Echogenicity is diffusely increased. Renal cortical thickness is normal. No mass, pelvicaliectasis, or perinephric fluid collection. There is no sonographically demonstrable calculus or ureterectasis. Visualized.  Bladder:  Appears normal for degree of bladder distention. A Foley catheter is present within the urinary bladder. There remains some urine in the urinary bladder currently, however.  IMPRESSION: Both kidneys are diffusely echogenic, a finding that may be seen with medical renal disease. The renal cortical thickness is normal bilaterally. There is slight fullness of the right renal collecting system of uncertain etiology. No pelvicaliectasis is seen on the left. No renal masses are identified on either side.   Electronically Signed   By: Bretta Bang M.D.   On: 02/05/2014 16:42   Dg Chest Port 1 View  02/11/2014   CLINICAL DATA:  Ventilated patient; history of HIV knee, acute respiratory failure and aspiration pneumonia  EXAM: PORTABLE CHEST - 1  VIEW  COMPARISON:  Portable chest x-ray of February 10, 2014.  FINDINGS: The  lungs are well-expanded and clear. The heart and mediastinal structures are normal. There is no pleural effusion or pneumothorax. The endotracheal tube tip lies 5.1 cm above the crotch of the carina. The left internal jugular venous catheter tip projects over the mid to distal SVC. The feeding tube tip projects below the inferior margin of the image. The bony thorax is unremarkable.  IMPRESSION: There is no evidence of pneumonia nor other acute cardiopulmonary abnormality.   Electronically Signed   By: Chianne Byrns  [place]   On: 02/11/2014 07:48   Dg Chest Port 1 View  02/10/2014   CLINICAL DATA:  Check endotracheal tube  EXAM: PORTABLE CHEST - 1 VIEW  COMPARISON:  02/08/2014  FINDINGS: Cardiac shadow is stable. A feeding catheter and left jugular central line are again seen and stable. An endotracheal tube is noted now all at 2.6 cm above the carina. This appears to have been advanced slightly in the interval from the prior exam. The lungs are well aerated without focal infiltrate  IMPRESSION: Tubes and lines as described above.  Interval clearing of the changes in the right lung base medially. No focal infiltrate is seen.   Electronically Signed   By: Alcide Clever M.D.   On: 02/10/2014 08:01   Dg Chest Port 1 View  02/08/2014   CLINICAL DATA:  Respiratory failure.  EXAM: PORTABLE CHEST - 1 VIEW  COMPARISON:  02/07/2014.  FINDINGS: Tracheostomy to, feeding tube, left IJ line in stable position. Mediastinum and hilar structures are unremarkable. Heart size stable. Mild right lower lobe infiltrate cannot be excluded. No pleural effusion or pneumothorax. Left costophrenic angle not imaged.  IMPRESSION: 1. Line and tubes in stable position. 2. Mild infiltrate right lower lobe cannot be excluded.   Electronically Signed   By: Maisie Fus  [place]   On: 02/08/2014 07:18   Dg Chest Port 1 View  02/07/2014   CLINICAL DATA:  Respiratory failure/hypoxia  EXAM: PORTABLE CHEST - 1 VIEW  COMPARISON:  February 06, 2014  FINDINGS:  Endotracheal tube tip is 5.8 cm above the carina. Central catheter tip is at the cavoatrial junction. Feeding tube tip is below the diaphragm. No pneumothorax. Lungs are clear. Heart size and pulmonary vascularity are normal. No adenopathy. No bone lesions.  IMPRESSION: Tube and catheter positions as described without pneumothorax. No edema or consolidation.   Electronically Signed   By: Bretta Bang M.D.   On: 02/07/2014 11:04   Dg Chest Port 1 View  02/06/2014   CLINICAL DATA:  Pneumocystis carinii pneumonia.  EXAM: PORTABLE CHEST - 1 VIEW  COMPARISON:  02/05/2014 and 02/04/2014  FINDINGS: Endotracheal tube, central catheter, and feeding tube in place, unchanged. Heart size and vascularity are normal. Right lung is now clear. Slight hazy infiltrate at the left base has almost resolved. No effusions.  IMPRESSION: Minimal residual hazy infiltrate at the left base.   Electronically Signed   By: Geanie Cooley M.D.   On: 02/06/2014 06:07   Dg Chest Port 1 View  02/05/2014   CLINICAL DATA:  Acute respiratory failure.  EXAM: PORTABLE CHEST - 1 VIEW  COMPARISON:  02/04/2014  FINDINGS: Endotracheal tube is 4.9 cm above the carina. Feeding tube extends into the abdomen. Patient is rotated towards the left which limits evaluation of the central line placement. The left jugular central line tip is in the region of the lower SVC. Concern for subtle airspace densities in  the right lower lung. Heart size is normal. Improved aeration at the left lung base. There may be some residual airspace densities in the left mid lung.  IMPRESSION: Residual airspace densities at the right lung base and left mid lung. Improved aeration at the left lung base.  Support apparatuses as described.   Electronically Signed   By: Richarda Overlie M.D.   On: 02/05/2014 07:43   Dg Chest Portable 1 View  02/04/2014   CLINICAL DATA:  Pneumonia  EXAM: PORTABLE CHEST - 1 VIEW  COMPARISON:  02/03/2014  FINDINGS: Cardiac shadow is stable. A left  jugular central line, feeding catheter and endotracheal tube are again seen and stable in position. The endotracheal tube lies 3.8 cm above the carina. There is been improved aeration in the right lung base. Persistent left basilar changes are noted. Continued followup is recommended.  IMPRESSION: Improved aeration in the right lung base. Stable changes in the left lung base are seen.  Tubes and lines as described.   Electronically Signed   By: Alcide Clever M.D.   On: 02/04/2014 07:37   Portable Chest Xray  02/04/2014   CLINICAL DATA:  Line placement.  Respiratory failure.  EXAM: PORTABLE CHEST - 1 VIEW  COMPARISON:  02/03/2014 at 7:34 a.m.  FINDINGS: New left IJ catheter, tip at the upper cavoatrial junction. No evidence of pneumothorax.  New endotracheal tube, tip 1 cm above the carina. Feeding tube remains at least in the stomach.  Greater lung volumes after intubation. Bilateral airspace disease persists, focal in the right infrahilar lung can suspicious for pneumonia. No evidence of effusion or pneumothorax.  IMPRESSION: 1. New endotracheal tube, tip 1 cm above the carina. 2. New left IJ catheter is in good position.  No pneumothorax. 3. Improved lung volumes with persistent bilateral airspace disease (likely pneumonia).   Electronically Signed   By: Tiburcio Pea M.D.   On: 02/04/2014 01:07   Dg Chest Port 1 View  02/03/2014   CLINICAL DATA:  Persistent pneumonia  EXAM: PORTABLE CHEST - 1 VIEW  COMPARISON:  February 01, 2014  FINDINGS: Feeding tube tip is below the diaphragm. No pneumothorax. There is persistent left lower lobe consolidation. Patchy airspace consolidation in the right lower lobe is again noted. In comparison with the recent prior study, there is slightly less perihilar interstitial pneumonitis. No new infiltrate is appreciable. Heart size and pulmonary vascularity are normal. No adenopathy. No bone lesions.  IMPRESSION: Persistent airspace consolidation in the left lower lobe and to a  lesser extent in the right base. There is slightly less perihilar interstitial pneumonitis compared to recent prior study. No new opacity. No change in cardiac silhouette. No pneumothorax.   Electronically Signed   By: Bretta Bang M.D.   On: 02/03/2014 07:51   Dg Chest Port 1 View  02/01/2014   CLINICAL DATA:  Fever, aspiration pneumonia.  EXAM: PORTABLE CHEST - 1 VIEW  COMPARISON:  01/29/2014.  FINDINGS: Feeding tube is followed into the stomach with the tip projecting beyond the inferior margin of the image. Trachea is midline. Heart size stable. There is worsening bilateral airspace disease, somewhat perihilar predominant, right greater than left. No definite pleural fluid. No pneumothorax.  IMPRESSION: Worsening bilateral airspace disease, somewhat perihilar predominant, right greater than left, suspicious for pneumonia. Aspiration not excluded.   Electronically Signed   By: Leanna Battles M.D.   On: 02/01/2014 15:26   Dg Chest Port 1 View  01/29/2014   CLINICAL DATA:  Shortness  of breath, unresponsive  EXAM: PORTABLE CHEST - 1 VIEW  COMPARISON:  01/27/2014  FINDINGS: The cardiac shadow is stable. The lungs are well aerated bilaterally. Increasing density in the medial right lung base is noted consistent with atelectasis and/or early infiltrate. No pneumothorax or sizable effusion is seen.  IMPRESSION: Increased density in the right lung base related to atelectasis/early infiltrate.   Electronically Signed   By: Alcide Clever M.D.   On: 01/29/2014 06:55   Dg Chest Portable 1 View  01/27/2014   CLINICAL DATA:  Altered mental status  EXAM: PORTABLE CHEST - 1 VIEW  COMPARISON:  None.  FINDINGS: The patient is rotated. There is no focal parenchymal opacity, pleural effusion, or pneumothorax. The heart and mediastinal contours are unremarkable.  The osseous structures are unremarkable.  IMPRESSION: No active disease.   Electronically Signed   By: Elige Ko   On: 01/27/2014 16:17   Dg Abd  Portable 1v  01/31/2014   CLINICAL DATA:  NG tube placement.  EXAM: PORTABLE ABDOMEN - 1 VIEW  COMPARISON:  None.  FINDINGS: Bowel gas pattern is nonobstructive. A feeding tube follows the greater curvature of the stomach and terminates in the expected location of the antrum/pylorus.  IMPRESSION: Feeding tube terminates in the expected location of the antrum/pylorus.   Electronically Signed   By: Britta Mccreedy M.D.   On: 01/31/2014 18:58   Dg Fluoro Guide Lumbar Puncture  01/30/2014   CLINICAL DATA:  43 year old female with fever and altered mental status. Suspicious for encephalitis.  EXAM: DIAGNOSTIC LUMBAR PUNCTURE UNDER FLUOROSCOPIC GUIDANCE  FLUOROSCOPY TIME:  51 seconds  PROCEDURE: Informed consent was obtained from the patient prior to the procedure, including potential complications of headache, allergy, and pain. With the patient prone, the lower back was prepped with Betadine. 1% Lidocaine was used for local anesthesia. Lumbar puncture was performed at the L3-L4 level using a 20 gauge needle with return of yellow tinged CSF with an opening pressure of 17 cm water. 10 ml of CSF were obtained for laboratory studies. The patient tolerated the procedure well and there were no apparent complications.  IMPRESSION: 1. Successful uncomplicated fluoroscopic guided lumbar puncture at L3-L4 with opening pressure of 17 cm of water, yielding consistently light yellow tinged CSF.   Electronically Signed   By: Trudie Reed M.D.   On: 01/30/2014 17:02         Subjective: Patient denies fevers, chills, headache, chest pain, dyspnea, nausea, vomiting,  abdominal pain, dysuria, hematuria. She continues to have loose stools   Objective: Filed Vitals:   02/20/14 0300 02/20/14 0448 02/20/14 1111 02/20/14 1359  BP:  146/71 160/76 147/74  Pulse:  69  75  Temp:  98.4 F (36.9 C)  98.4 F (36.9 C)  TempSrc:  Oral  Oral  Resp:  18  16  Height:      Weight: 55.112 kg (121 lb 8 oz)     SpO2:  97%   100%    Intake/Output Summary (Last 24 hours) at 02/20/14 1457 Last data filed at 02/20/14 0944  Gross per 24 hour  Intake    302 ml  Output    275 ml  Net     27 ml   Weight change: -3.188 kg (-7 lb 0.5 oz) Exam:   General:  Pt is alert, follows commands appropriately, not in acute distress  HEENT: No icterus, No thrush,[place]/AT  Cardiovascular: RRR, S1/S2, no rubs, no gallops  Respiratory: CTA bilaterally, no wheezing, no  crackles, no rhonchi  Abdomen: Soft/+BS, non tender, non distended, no guarding  Extremities: No edema, No lymphangitis, No petechiae, No rashes, no synovitis  Data Reviewed: Basic Metabolic Panel:  Recent Labs Lab 02/14/14 0605 02/15/14 0525 02/16/14 0706 02/17/14 0544 02/18/14 0358  NA 142 143 142 145  143 144  K 4.3 4.7 4.4 4.2  4.1 3.9  CL 103 103 105 109  107 107  CO2 23 22 21 21  21 23   GLUCOSE 90 105* 75 77  74 75  BUN 113* 119* 110* 99*  101* 96*  CREATININE 3.01* 3.16* 3.16* 3.18*  3.14* 2.92*  CALCIUM 8.8 8.6 8.7 8.4  8.5 8.4  MG 2.0 2.0  --   --   --   PHOS 7.8*  --  8.3* 7.1*  --    Liver Function Tests:  Recent Labs Lab 02/14/14 0605 02/16/14 0706 02/17/14 0544 02/18/14 0358  AST  --   --   --  20  ALT  --   --   --  36*  ALKPHOS  --   --   --  62  BILITOT  --   --   --  0.4  PROT  --   --   --  5.1*  ALBUMIN 1.7* 1.8* 1.7* 1.9*   No results for input(s): LIPASE, AMYLASE in the last 168 hours. No results for input(s): AMMONIA in the last 168 hours. CBC:  Recent Labs Lab 02/14/14 0605 02/15/14 0525 02/16/14 0706 02/18/14 0358  WBC 5.4 6.1 6.0 5.5  HGB 8.9* 8.3* 8.1* 7.5*  HCT 27.1* 25.4* 25.3* 22.9*  MCV 78.8 78.9 81.6 79.5  PLT 294 269 215 181   Cardiac Enzymes:  Recent Labs Lab 02/18/14 0855  CKTOTAL 49   BNP: Invalid input(s): POCBNP CBG:  Recent Labs Lab 02/19/14 1958 02/20/14 0024 02/20/14 0438 02/20/14 0756 02/20/14 1219  GLUCAP 109* 91 94 109* 114*    Recent Results (from the  past 240 hour(s))  Clostridium Difficile by PCR     Status: None   Collection Time: 02/17/14  2:36 AM  Result Value Ref Range Status   C difficile by pcr NEGATIVE NEGATIVE Final     Scheduled Meds: . abacavir  600 mg Per NG tube Daily  . amLODipine  5 mg Oral Daily  . [START ON 02/21/2014] antiseptic oral rinse  7 mL Mouth Rinse BID  . aspirin  81 mg Oral Daily  . azithromycin  1,200 mg Oral Weekly  . clindamycin  600 mg Oral 3 times per day  . dolutegravir  50 mg Oral BID  . feeding supplement (ENSURE COMPLETE)  237 mL Oral BID BM  . feeding supplement (PRO-STAT SUGAR FREE 64)  30 mL Oral BID BM  . fluconazole  100 mg Per Tube Weekly  . heparin subcutaneous  5,000 Units Subcutaneous 3 times per day  . iron polysaccharides  150 mg Oral Daily  . labetalol  200 mg Oral BID  . lamiVUDine  100 mg Oral Daily  . levETIRAcetam  500 mg Oral BID  . pantoprazole  40 mg Oral Daily  . primaquine  30 mg Per Tube Q24H   Continuous Infusions:    Ellar Hakala, DO  [facility] Pager [phone]  If 7PM-7AM, please contact night-coverage [email or web address] Password [email or web address] 02/20/2014, 2:57 PM   LOS: 24 days

## 2014-02-20 NOTE — Plan of Care (Signed)
Problem: Phase I Progression Outcomes Goal: Initial discharge plan identified Outcome: Progressing  Problem: Phase II Progression Outcomes Goal: Discharge plan established Outcome: Progressing  Problem: Discharge Progression Outcomes Goal: Pain controlled with appropriate interventions Outcome: Progressing

## 2014-02-21 LAB — GLUCOSE, CAPILLARY
GLUCOSE-CAPILLARY: 86 mg/dL (ref 70–99)
GLUCOSE-CAPILLARY: 95 mg/dL (ref 70–99)
GLUCOSE-CAPILLARY: 90 mg/dL (ref 70–99)
Glucose-Capillary: 77 mg/dL (ref 70–99)
Glucose-Capillary: 79 mg/dL (ref 70–99)

## 2014-02-21 LAB — ALDOLASE: Aldolase: 4.8 U/L (ref <=8.1)

## 2014-02-21 MED ORDER — HYDRALAZINE HCL 20 MG/ML IJ SOLN
10.0000 mg | Freq: Four times a day (QID) | INTRAMUSCULAR | Status: DC | PRN
  Administered 2014-02-21: 10 mg via INTRAVENOUS
  Filled 2014-02-21: qty 1

## 2014-02-21 MED ORDER — HYDRALAZINE HCL 20 MG/ML IJ SOLN
20.0000 mg | Freq: Four times a day (QID) | INTRAMUSCULAR | Status: DC | PRN
  Filled 2014-02-21: qty 1

## 2014-02-21 MED ORDER — HYDRALAZINE HCL 20 MG/ML IJ SOLN
10.0000 mg | Freq: Once | INTRAMUSCULAR | Status: AC
  Administered 2014-02-21: 10 mg via INTRAVENOUS
  Filled 2014-02-21: qty 1

## 2014-02-21 NOTE — Progress Notes (Signed)
PROGRESS NOTE  Beryle BeamsKristy Strebeck ZOX:[id number]:[id number] DOB: 1971/01/11 DOA: 01/27/2014 PCP: No primary care provider on file.  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 She was initially admitted with acute encephalopathy on the hospitalist service, then decompensated from a respiratory standpoint and was intubated 10/28. Patient's respiratory status has gradually improved and was able to be extubated on 11/5, and currently she is comfortable on room air. Her course was complicated by acute renal failure thought to be multifactorial due to nephrotoxic agents, ATN. Her acute encephalopathy has been very slow to improve and she has intermittent periods of confusion alternating with alertness. On 11/10 her cognitive ability was significantly improved. On 11/13 I believe she is at baseline. The patient is waiting for ADAP approval. Marthann SchillerMitch [phone]) of [facility] is her [employer] Counselor and is following the approval process. Infectious disease, nephrology, PCCM and as well as palliative care have been involved in her care. Assessment/Plan: Acute hypoxemic respiratory failure Resolved. 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 Patient intubated from 10/28 until 11/5. Now breathing comfortably on room air.  -CXR 11/5 clear  Pneumocystis pneumonia - continue antibiotics (11/15 is day 18/21). Patient is now asymptomatic. Discontinued prednisone after 11/13. She is on clindamycin, primaquine. Being followed by ID. Per Dr. Algis LimingVanDam, when antibiotics are complete start Dapsone 100 mg daily to continue indefinitely. Patient should follow up with his office one time after discharge. [facility]  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 11/13 is the final day of IV acyclovir. Mental status much  improved but patient still appears to lack insight and capacity.  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 -Unable to left legs against gravity. PT Eval completed recommended SNF.  -MRI of the spine showed nonspecific perispinal edema. Current plan is to D/C to SNF as patient is unable to bear her own weight and very dependent on assistance with ADLs.  Elevated troponin level - in the setting of demand ischemia and acute renal failure. Patient denies chest pain.  Acute on chronic diastolic heart failure - On beta blocker. No sign of volume overload presently  Acute on chronic renal failure with metabolic acidosis - Likely ATN from antibioitics with possible ischemia from hypotension. 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 BUN/CR is down trending (2.92). Bicarb has now normalized. Nephrology has followed her 2x during this hospitalization and has signed off.  Protein calorie malnutrition - nutrition consulted.  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 Patient first learned of her HIV diagnosis in 2008. On fluconazole weekly. ID working to gain urgent ADAP approval. Need to gain this prior to discharge.  -We will need to have confirmation of ADAP approval or confirmation that she can get antiretrovirals prior to d/c -anticipate 02/22/14 d/c Seizure disorder - stable.  -continue Keppra  Hypertension - moderately controlled. Added norvasc 11/11.  -02/20/14-increase amlodipine to 10mg  daily -continue Normodyne  Family communication: updated son, Lianne Curenthony Jr.   Procedures/Studies: Ct Head Wo Contrast  01/27/2014   CLINICAL DATA:  Found  unresponsive.  Altered mental status.  EXAM: CT HEAD WITHOUT CONTRAST  CT CERVICAL SPINE WITHOUT CONTRAST  TECHNIQUE: Multidetector CT imaging of the head and cervical spine was performed following the standard protocol without intravenous contrast. Multiplanar CT image reconstructions of the cervical spine were also generated.  COMPARISON:  None.  FINDINGS: CT HEAD FINDINGS  The ventricles are normal in size and configuration. No extra-axial fluid collections are identified. The gray-white differentiation is normal. No CT findings for acute intracranial process such as hemorrhage or infarction. No mass lesions. The brainstem and cerebellum are grossly normal.  The bony structures are intact. The paranasal sinuses and mastoid air cells are clear. The globes are intact.  CT CERVICAL SPINE FINDINGS  Normal alignment of the cervical vertebral bodies. Disc spaces and vertebral bodies are maintained. No acute fracture or abnormal prevertebral soft tissue swelling. The facets are normally aligned. The skullbase C1 and C1-2 articulations are maintained. The dens is normal. No large disc protrusions, spinal or foraminal stenosis. The lung apices are clear. Emphysematous changes are noted. There is some air in the supraclavicular fossa possibly due to a laceration. No pneumothorax is identified.  IMPRESSION: No acute intracranial findings or skull fracture.  Normal CT examination of the cervical spine. No cervical spine fracture.   Electronically Signed   By: Loralie ChampagneMark  Gallerani M.D.   On: 01/27/2014 17:27   Ct Cervical Spine Wo Contrast  01/27/2014   CLINICAL DATA:  Found unresponsive.  Altered mental status.  EXAM: CT HEAD WITHOUT CONTRAST  CT CERVICAL SPINE WITHOUT CONTRAST  TECHNIQUE: Multidetector CT imaging of the head and cervical spine was performed following the standard protocol without intravenous contrast. Multiplanar CT image reconstructions of the cervical spine were also generated.  COMPARISON:  None.   FINDINGS: CT HEAD FINDINGS  The ventricles are normal in size and configuration. No extra-axial fluid collections are identified. The gray-white differentiation is normal. No CT findings for acute intracranial process such as hemorrhage or infarction. No mass lesions. The brainstem and cerebellum are grossly normal.  The bony structures are intact. The paranasal sinuses and mastoid air cells are clear. The globes are intact.  CT CERVICAL SPINE FINDINGS  Normal alignment of the cervical vertebral bodies. Disc spaces and vertebral bodies are maintained. No acute fracture or abnormal prevertebral soft tissue swelling. The facets are normally aligned. The skullbase C1 and C1-2 articulations are maintained. The dens is normal. No large disc protrusions, spinal or foraminal stenosis. The lung apices are clear. Emphysematous changes are noted. There is some air in the supraclavicular fossa possibly due to a laceration. No pneumothorax is identified.  IMPRESSION: No acute intracranial findings or skull fracture.  Normal CT examination of the cervical spine. No cervical spine fracture.   Electronically Signed   By: Loralie ChampagneMark  Gallerani M.D.   On: 01/27/2014 17:27   Mr Brain Wo Contrast  01/28/2014   CLINICAL DATA:  Unresponsive. The patient had an episode with loss of consciousness. She has been aphasic since that episode despite regaining who consciousness otherwise.  EXAM: MRI HEAD WITHOUT CONTRAST  TECHNIQUE: Multiplanar, multiecho pulse sequences of the brain and surrounding structures were obtained without intravenous contrast.  COMPARISON:  CT head without contrast 01/27/2014.  FINDINGS: Abnormal cortical and subcortical T2 hyperintensity is evident in the posterior insular cortex and along the sylvian fissure bilaterally. There is abnormal signal extending into the operculum bilaterally, left greater than right. This signal abnormality extends nearly to the vertex within the precentral sulcus. There is no significant  associated restricted diffusion.  No hemorrhage or mass lesion is present. There is no significant mass effect.  Flow is present in the major intracranial arteries. The globes and orbits are intact. Mild mucosal thickening is noted in the ethmoid air cells and sphenoid sinuses bilaterally.  The coronal T2 weighted images are somewhat degraded by patient motion. No definite hippocampal abnormality is evident.  IMPRESSION: 1. Diffuse cortical and subcortical signal abnormality bilaterally within the temporal and posterior frontal lobes as described. This is most likely a postictal phenomenon. There is no restricted diffusion to suggest acute or subacute ischemia. No mass lesion is evident. This could be related to elevated blood pressure similar to posterior reversible encephalopathy syndrome. The distribution is atypical. Recommend follow-up MRI of the brain without and with contrast in 2-3 weeks as the symptoms resolve. 2. Minimal sinus disease.   Electronically Signed   By: Gennette Pac M.D.   On: 01/28/2014 10:27   Mr Lumbar Spine Wo Contrast  02/18/2014   CLINICAL DATA:  Weakness in bilateral lower extremities. Encephalopathy.  EXAM: MRI LUMBAR SPINE WITHOUT CONTRAST  TECHNIQUE: Multiplanar, multisequence MR imaging of the lumbar spine was performed. No intravenous contrast was administered.  COMPARISON:  None.  FINDINGS: Examination is quite limited due to patient's condition. He would not cooperate with any further imaging.  The T2 sagittal sequence demonstrates normal alignment of the lumbar vertebral bodies. They demonstrate grossly normal signal intensity. The intervertebral disc spaces are maintained with normal T2 signal intensity. The conus medullaris terminates at L1. The spinal canal is quite generous. There are no disc protrusions, spinal or foraminal stenosis. No findings for diskitis or osteomyelitis. No findings for septic facet arthritis.  There is diffuse edema like signal abnormality in  paraspinal muscles which is nonspecific myositis. This could be inflammatory such as polymyositis or dermatomyositis, posttraumatic, drug induced, etc.  IMPRESSION: Normal alignment of the lumbar vertebral bodies and no acute bony findings.  No disc protrusions, spinal or foraminal stenosis.  Nonspecific edema like signal abnormality in the paraspinal muscles as discussed above.   Electronically Signed   By: Loralie Champagne M.D.   On: 02/18/2014 17:27   US Renal  02/05/2014   CLINICAL DATA:  Acute renal insufficiency  EXAM: RENAL/URINARY TRACT ULTRASOUND COMPLETE  COMPARISON:  None.  FINDINGS: Right Kidney:  Length: 10.0 cm. Echogenicity is diffusely increased. The renal cortical thickness is normal. There is no appreciable mass or perinephric fluid. There is slight fullness of the renal collecting system without focal obstruction site seen. There is no sonographically demonstrable calculus or ureterectasis.  Left Kidney:  Length: 10.4 cm. Echogenicity is diffusely increased. Renal cortical thickness is normal. No mass, pelvicaliectasis, or perinephric fluid collection. There is no sonographically demonstrable calculus or ureterectasis. Visualized.  Bladder:  Appears normal for degree of bladder distention. A Foley catheter is present within the urinary bladder. There remains some urine in the urinary bladder currently, however.  IMPRESSION: Both kidneys are diffusely echogenic, a finding that may be seen with medical renal disease. The renal cortical thickness is normal bilaterally. There is slight fullness of the right renal collecting system of uncertain etiology. No pelvicaliectasis is seen on the left. No renal masses are identified on either side.   Electronically Signed   By: Bretta Bang M.D.   On: 02/05/2014 16:42   Dg Chest Port 1 View  02/11/2014   CLINICAL DATA:  Ventilated patient; history of HIV knee, acute respiratory failure and aspiration pneumonia  EXAM: PORTABLE CHEST - 1 VIEW  COMPARISON:  Portable chest x-ray of February 10, 2014.  FINDINGS: The lungs are well-expanded and clear. The heart and mediastinal structures are normal. There is no pleural effusion or pneumothorax. The endotracheal tube tip lies 5.1 cm above the crotch of the carina. The left internal jugular venous catheter tip projects over the mid to distal SVC. The feeding tube tip projects below the inferior margin of the image. The bony thorax is unremarkable.  IMPRESSION: There is no evidence of pneumonia nor other acute cardiopulmonary abnormality.   Electronically Signed   By: Joei Frangos  [place]   On: 02/11/2014 07:48   Dg Chest Port 1 View  02/10/2014   CLINICAL DATA:  Check endotracheal tube  EXAM: PORTABLE CHEST - 1 VIEW  COMPARISON:  02/08/2014  FINDINGS: Cardiac shadow is stable. A feeding catheter and left jugular central line are again seen and stable. An endotracheal tube is noted now all at 2.6 cm above the carina. This appears to have been advanced slightly in the interval from the prior exam. The lungs are well aerated without focal infiltrate  IMPRESSION: Tubes and lines as described above.  Interval clearing of the changes in the right lung base medially. No focal infiltrate is seen.   Electronically Signed   By: Alcide Clever M.D.   On: 02/10/2014 08:01   Dg Chest Port 1 View  02/08/2014   CLINICAL DATA:  Respiratory failure.  EXAM: PORTABLE CHEST - 1 VIEW  COMPARISON:  02/07/2014.  FINDINGS: Tracheostomy to, feeding tube, left IJ line in stable position. Mediastinum and hilar structures are unremarkable. Heart size stable. Mild right lower lobe infiltrate cannot be excluded. No pleural effusion or pneumothorax. Left costophrenic angle not imaged.  IMPRESSION: 1. Line and tubes in stable position. 2. Mild infiltrate right lower lobe cannot be excluded.   Electronically Signed   By: Maisie Fus  Register   On: 02/08/2014 07:18   Dg Chest Port 1 View  02/07/2014   CLINICAL DATA:  Respiratory failure/hypoxia   EXAM: PORTABLE CHEST - 1 VIEW  COMPARISON:  February 06, 2014  FINDINGS: Endotracheal tube tip is 5.8 cm above the carina. Central catheter tip is at the cavoatrial junction. Feeding tube tip is below the diaphragm. No pneumothorax. Lungs are clear. Heart size and pulmonary vascularity are normal. No adenopathy. No bone lesions.  IMPRESSION: Tube and catheter positions as described without pneumothorax. No edema or consolidation.   Electronically Signed   By: Bretta Bang M.D.   On: 02/07/2014 11:04   Dg Chest Port 1 View  02/06/2014   CLINICAL DATA:  Pneumocystis carinii pneumonia.  EXAM: PORTABLE CHEST - 1 VIEW  COMPARISON:  02/05/2014 and 02/04/2014  FINDINGS: Endotracheal tube, central catheter, and feeding tube in place, unchanged. Heart size and vascularity are normal. Right lung is now clear. Slight hazy infiltrate at the left base has almost resolved. No effusions.  IMPRESSION: Minimal residual hazy infiltrate at the left base.   Electronically Signed   By: Geanie Cooley M.D.   On: 02/06/2014 06:07   Dg Chest Port 1 View  02/05/2014   CLINICAL DATA:  Acute respiratory failure.  EXAM: PORTABLE CHEST - 1 VIEW  COMPARISON:  02/04/2014  FINDINGS: Endotracheal tube is 4.9 cm above the carina. Feeding tube extends into the abdomen. Patient is rotated towards the left which limits evaluation of the central line placement. The left jugular central line tip is in the region of the lower SVC. Concern for subtle airspace densities in the right lower  lung. Heart size is normal. Improved aeration at the left lung base. There may be some residual airspace densities in the left mid lung.  IMPRESSION: Residual airspace densities at the right lung base and left mid lung. Improved aeration at the left lung base.  Support apparatuses as described.   Electronically Signed   By: Richarda Overlie M.D.   On: 02/05/2014 07:43   Dg Chest Portable 1 View  02/04/2014   CLINICAL DATA:  Pneumonia  EXAM: PORTABLE CHEST - 1 VIEW   COMPARISON:  02/03/2014  FINDINGS: Cardiac shadow is stable. A left jugular central line, feeding catheter and endotracheal tube are again seen and stable in position. The endotracheal tube lies 3.8 cm above the carina. There is been improved aeration in the right lung base. Persistent left basilar changes are noted. Continued followup is recommended.  IMPRESSION: Improved aeration in the right lung base. Stable changes in the left lung base are seen.  Tubes and lines as described.   Electronically Signed   By: Alcide Clever M.D.   On: 02/04/2014 07:37   Portable Chest Xray  02/04/2014   CLINICAL DATA:  Line placement.  Respiratory failure.  EXAM: PORTABLE CHEST - 1 VIEW  COMPARISON:  02/03/2014 at 7:34 a.m.  FINDINGS: New left IJ catheter, tip at the upper cavoatrial junction. No evidence of pneumothorax.  New endotracheal tube, tip 1 cm above the carina. Feeding tube remains at least in the stomach.  Greater lung volumes after intubation. Bilateral airspace disease persists, focal in the right infrahilar lung can suspicious for pneumonia. No evidence of effusion or pneumothorax.  IMPRESSION: 1. New endotracheal tube, tip 1 cm above the carina. 2. New left IJ catheter is in good position.  No pneumothorax. 3. Improved lung volumes with persistent bilateral airspace disease (likely pneumonia).   Electronically Signed   By: Tiburcio Pea M.D.   On: 02/04/2014 01:07   Dg Chest Port 1 View  02/03/2014   CLINICAL DATA:  Persistent pneumonia  EXAM: PORTABLE CHEST - 1 VIEW  COMPARISON:  February 01, 2014  FINDINGS: Feeding tube tip is below the diaphragm. No pneumothorax. There is persistent left lower lobe consolidation. Patchy airspace consolidation in the right lower lobe is again noted. In comparison with the recent prior study, there is slightly less perihilar interstitial pneumonitis. No new infiltrate is appreciable. Heart size and pulmonary vascularity are normal. No adenopathy. No bone lesions.   IMPRESSION: Persistent airspace consolidation in the left lower lobe and to a lesser extent in the right base. There is slightly less perihilar interstitial pneumonitis compared to recent prior study. No new opacity. No change in cardiac silhouette. No pneumothorax.   Electronically Signed   By: Bretta Bang M.D.   On: 02/03/2014 07:51   Dg Chest Port 1 View  02/01/2014   CLINICAL DATA:  Fever, aspiration pneumonia.  EXAM: PORTABLE CHEST - 1 VIEW  COMPARISON:  01/29/2014.  FINDINGS: Feeding tube is followed into the stomach with the tip projecting beyond the inferior margin of the image. Trachea is midline. Heart size stable. There is worsening bilateral airspace disease, somewhat perihilar predominant, right greater than left. No definite pleural fluid. No pneumothorax.  IMPRESSION: Worsening bilateral airspace disease, somewhat perihilar predominant, right greater than left, suspicious for pneumonia. Aspiration not excluded.   Electronically Signed   By: Leanna Battles M.D.   On: 02/01/2014 15:26   Dg Chest Port 1 View  01/29/2014   CLINICAL DATA:  Shortness of breath, unresponsive  EXAM: PORTABLE CHEST - 1 VIEW  COMPARISON:  01/27/2014  FINDINGS: The cardiac shadow is stable. The lungs are well aerated bilaterally. Increasing density in the medial right lung base is noted consistent with atelectasis and/or early infiltrate. No pneumothorax or sizable effusion is seen.  IMPRESSION: Increased density in the right lung base related to atelectasis/early infiltrate.   Electronically Signed   By: Alcide Clever M.D.   On: 01/29/2014 06:55   Dg Chest Portable 1 View  01/27/2014   CLINICAL DATA:  Altered mental status  EXAM: PORTABLE CHEST - 1 VIEW  COMPARISON:  None.  FINDINGS: The patient is rotated. There is no focal parenchymal opacity, pleural effusion, or pneumothorax. The heart and mediastinal contours are unremarkable.  The osseous structures are unremarkable.  IMPRESSION: No active disease.    Electronically Signed   By: Elige Ko   On: 01/27/2014 16:17   Dg Abd Portable 1v  01/31/2014   CLINICAL DATA:  NG tube placement.  EXAM: PORTABLE ABDOMEN - 1 VIEW  COMPARISON:  None.  FINDINGS: Bowel gas pattern is nonobstructive. A feeding tube follows the greater curvature of the stomach and terminates in the expected location of the antrum/pylorus.  IMPRESSION: Feeding tube terminates in the expected location of the antrum/pylorus.   Electronically Signed   By: Britta Mccreedy M.D.   On: 01/31/2014 18:58   Dg Fluoro Guide Lumbar Puncture  01/30/2014   CLINICAL DATA:  43 year old female with fever and altered mental status. Suspicious for encephalitis.  EXAM: DIAGNOSTIC LUMBAR PUNCTURE UNDER FLUOROSCOPIC GUIDANCE  FLUOROSCOPY TIME:  51 seconds  PROCEDURE: Informed consent was obtained from the patient prior to the procedure, including potential complications of headache, allergy, and pain. With the patient prone, the lower back was prepped with Betadine. 1% Lidocaine was used for local anesthesia. Lumbar puncture was performed at the L3-L4 level using a 20 gauge needle with return of yellow tinged CSF with an opening pressure of 17 cm water. 10 ml of CSF were obtained for laboratory studies. The patient tolerated the procedure well and there were no apparent complications.  IMPRESSION: 1. Successful uncomplicated fluoroscopic guided lumbar puncture at L3-L4 with opening pressure of 17 cm of water, yielding consistently light yellow tinged CSF.   Electronically Signed   By: Trudie Reed M.D.   On: 01/30/2014 17:02         Subjective: Patient denies fevers, chills, headache, chest pain, dyspnea, nausea, vomiting, diarrhea, abdominal pain, dysuria, hematuria   Objective: Filed Vitals:   02/21/14 0418 02/21/14 0639 02/21/14 0821 02/21/14 1343  BP:  172/80 150/67 124/60  Pulse:    69  Temp:    98.7 F (37.1 C)  TempSrc:    Oral  Resp:    17  Height:      Weight: 55.339 kg (122 lb)      SpO2:    100%    Intake/Output Summary (Last 24 hours) at 02/21/14 1754 Last data filed at 02/21/14 1347  Gross per 24 hour  Intake    564 ml  Output      0 ml  Net    564 ml   Weight change: 0.227 kg (8 oz) Exam:   General:  Pt is alert, follows commands appropriately, not in acute distress  HEENT: No icterus, No thrush,  [place]/AT  Cardiovascular: RRR, S1/S2, no rubs, no gallops  Respiratory: bibasilar rales. No wheezing. Good air movement.  Abdomen: Soft/+BS, non tender, non distended, no guarding  Extremities: No  edema, No lymphangitis, No petechiae, No rashes, no synovitis  Data Reviewed: Basic Metabolic Panel:  Recent Labs Lab 02/15/14 0525 02/16/14 0706 02/17/14 0544 02/18/14 0358  NA 143 142 145  143 144  K 4.7 4.4 4.2  4.1 3.9  CL 103 105 109  107 107  CO2 22 21 21  21 23   GLUCOSE 105* 75 77  74 75  BUN 119* 110* 99*  101* 96*  CREATININE 3.16* 3.16* 3.18*  3.14* 2.92*  CALCIUM 8.6 8.7 8.4  8.5 8.4  MG 2.0  --   --   --   PHOS  --  8.3* 7.1*  --    Liver Function Tests:  Recent Labs Lab 02/16/14 0706 02/17/14 0544 02/18/14 0358  AST  --   --  20  ALT  --   --  36*  ALKPHOS  --   --  62  BILITOT  --   --  0.4  PROT  --   --  5.1*  ALBUMIN 1.8* 1.7* 1.9*   No results for input(s): LIPASE, AMYLASE in the last 168 hours. No results for input(s): AMMONIA in the last 168 hours. CBC:  Recent Labs Lab 02/15/14 0525 02/16/14 0706 02/18/14 0358  WBC 6.1 6.0 5.5  HGB 8.3* 8.1* 7.5*  HCT 25.4* 25.3* 22.9*  MCV 78.9 81.6 79.5  PLT 269 215 181   Cardiac Enzymes:  Recent Labs Lab 02/18/14 0855  CKTOTAL 49   BNP: Invalid input(s): POCBNP CBG:  Recent Labs Lab 02/20/14 1945 02/21/14 0008 02/21/14 0412 02/21/14 0822 02/21/14 1224  GLUCAP 85 79 77 86 95    Recent Results (from the past 240 hour(s))  Clostridium Difficile by PCR     Status: None   Collection Time: 02/17/14  2:36 AM  Result Value Ref Range Status   C  difficile by pcr NEGATIVE NEGATIVE Final     Scheduled Meds: . abacavir  600 mg Per NG tube Daily  . amLODipine  10 mg Oral Daily  . antiseptic oral rinse  7 mL Mouth Rinse BID  . aspirin  81 mg Oral Daily  . azithromycin  1,200 mg Oral Weekly  . clindamycin  600 mg Oral 3 times per day  . dolutegravir  50 mg Oral BID  . feeding supplement (ENSURE COMPLETE)  237 mL Oral BID BM  . feeding supplement (PRO-STAT SUGAR FREE 64)  30 mL Oral BID BM  . fluconazole  100 mg Per Tube Weekly  . heparin subcutaneous  5,000 Units Subcutaneous 3 times per day  . iron polysaccharides  150 mg Oral Daily  . labetalol  200 mg Oral BID  . lamiVUDine  100 mg Oral Daily  . levETIRAcetam  500 mg Oral BID  . pantoprazole  40 mg Oral Daily  . primaquine  30 mg Per Tube Q24H   Continuous Infusions:    Edilson Vital, DO  [facility] Pager [phone]  If 7PM-7AM, please contact night-coverage [email or web address] Password [email or web address] 02/21/2014, 5:54 PM   LOS: 25 days

## 2014-02-21 NOTE — Plan of Care (Signed)
Problem: Phase I Progression Outcomes Goal: Initial discharge plan identified Outcome: Progressing  Problem: Discharge Progression Outcomes Goal: Pain controlled with appropriate interventions Outcome: Completed/Met Date Met:  02/21/14

## 2014-02-22 DIAGNOSIS — G729 Myopathy, unspecified: Secondary | ICD-10-CM

## 2014-02-22 LAB — CBC WITH DIFFERENTIAL/PLATELET
BASOS ABS: 0 10*3/uL (ref 0.0–0.1)
Basophils Relative: 0 % (ref 0–1)
EOS ABS: 0.1 10*3/uL (ref 0.0–0.7)
EOS PCT: 2 % (ref 0–5)
HCT: 19.1 % — ABNORMAL LOW (ref 36.0–46.0)
Hemoglobin: 6.3 g/dL — CL (ref 12.0–15.0)
LYMPHS ABS: 0.2 10*3/uL — AB (ref 0.7–4.0)
Lymphocytes Relative: 4 % — ABNORMAL LOW (ref 12–46)
MCH: 27.2 pg (ref 26.0–34.0)
MCHC: 33 g/dL (ref 30.0–36.0)
MCV: 82.3 fL (ref 78.0–100.0)
Monocytes Absolute: 0.5 10*3/uL (ref 0.1–1.0)
Monocytes Relative: 8 % (ref 3–12)
NEUTROS PCT: 86 % — AB (ref 43–77)
Neutro Abs: 5.4 10*3/uL (ref 1.7–7.7)
Platelets: 172 10*3/uL (ref 150–400)
RBC: 2.32 MIL/uL — AB (ref 3.87–5.11)
RDW: 17.9 % — AB (ref 11.5–15.5)
WBC: 6.2 10*3/uL (ref 4.0–10.5)

## 2014-02-22 LAB — GLUCOSE, CAPILLARY
GLUCOSE-CAPILLARY: 84 mg/dL (ref 70–99)
GLUCOSE-CAPILLARY: 99 mg/dL (ref 70–99)
Glucose-Capillary: 92 mg/dL (ref 70–99)
Glucose-Capillary: 93 mg/dL (ref 70–99)
Glucose-Capillary: 95 mg/dL (ref 70–99)

## 2014-02-22 LAB — COMPREHENSIVE METABOLIC PANEL
ALBUMIN: 1.8 g/dL — AB (ref 3.5–5.2)
ALK PHOS: 70 U/L (ref 39–117)
ALT: 18 U/L (ref 0–35)
AST: 13 U/L (ref 0–37)
Anion gap: 12 (ref 5–15)
BUN: 52 mg/dL — ABNORMAL HIGH (ref 6–23)
CO2: 22 mEq/L (ref 19–32)
Calcium: 8.1 mg/dL — ABNORMAL LOW (ref 8.4–10.5)
Chloride: 105 mEq/L (ref 96–112)
Creatinine, Ser: 1.92 mg/dL — ABNORMAL HIGH (ref 0.50–1.10)
GFR calc non Af Amer: 31 mL/min — ABNORMAL LOW (ref >90)
GFR, EST AFRICAN AMERICAN: 36 mL/min — AB (ref >90)
Glucose, Bld: 88 mg/dL (ref 70–99)
Potassium: 3.6 mEq/L — ABNORMAL LOW (ref 3.7–5.3)
Sodium: 139 mEq/L (ref 137–147)
TOTAL PROTEIN: 5.5 g/dL — AB (ref 6.0–8.3)
Total Bilirubin: 0.4 mg/dL (ref 0.3–1.2)

## 2014-02-22 LAB — GLUCOSE 6 PHOSPHATE DEHYDROGENASE: G6PDH: 14.2 U/g Hgb (ref 7.0–20.5)

## 2014-02-22 LAB — PREPARE RBC (CROSSMATCH)

## 2014-02-22 MED ORDER — ALUM & MAG HYDROXIDE-SIMETH 200-200-20 MG/5ML PO SUSP
15.0000 mL | Freq: Once | ORAL | Status: DC
  Filled 2014-02-22: qty 30

## 2014-02-22 MED ORDER — SODIUM CHLORIDE 0.9 % IV SOLN
Freq: Once | INTRAVENOUS | Status: AC
  Administered 2014-02-22: 250 mL via INTRAVENOUS

## 2014-02-22 MED ORDER — FUROSEMIDE 10 MG/ML IJ SOLN
20.0000 mg | Freq: Once | INTRAMUSCULAR | Status: AC
  Administered 2014-02-22: 20 mg via INTRAVENOUS
  Filled 2014-02-22: qty 2

## 2014-02-22 NOTE — Progress Notes (Signed)
Physical Therapy Treatment Patient Details Name: Desiree BeamsKristy Printup MRN: [id number] DOB: 09-26-1970 Today's Date: 02/22/2014    History of Present Illness 43 year old female with past medical history of hypertension, pancreatic cancer (per ED resident note, but this is not noted in H&P), marijuana use who [facility] 01/27/2014 after she was found unresponsive at home by a family member. Patient was found shaking, lying on the floor, unable to speak.    PT Comments    Pt willing to participate with therapy today, performed bed mobility, sitting balance EOB, pt requiring UE support in sitting or mod A without UE support due to poor trunk control. Attempted sit to stand with max A, achieved partial stand but pt unable to take significant wt through LE's for safe transfer and began to have BM, therefore returned to bed. Pt stating that she really wants to go home and have family care for her. This would be a monumental task at this point, recommending SNF.   Follow Up Recommendations  SNF     Equipment Recommendations  Wheelchair (measurements PT);Wheelchair cushion (measurements PT);3in1 (PT) (w/c seat belt)    Recommendations for Other Services OT consult     Precautions / Restrictions Precautions Precautions: Fall Precaution Comments: very little trunk control Restrictions Weight Bearing Restrictions: No    Mobility  Bed Mobility Overal bed mobility: Needs Assistance Bed Mobility: Supine to Sit;Sit to Supine;Rolling Rolling: Mod assist   Supine to sit: Max assist Sit to supine: Max assist   General bed mobility comments: pt able to initiate mvmt with UE's and trunk but no LE mvmt noted throughout session. Once in SL, pt able to maintain position with holding of rail. Max A to elevate/ lower trunk from/ to bed with transitional mvmts. Unable to control rate of descent of legs off bed, LE's supported to prevent injury  Transfers Overall transfer level: Needs  assistance Equipment used: None Transfers: Sit to/from Stand Sit to Stand: Max assist         General transfer comment: intiated sit to stand, pt achieved partial stand with knees and feet blocked, no significant wt taken through LE's and pt began to have BM so returned to sitting then supine for clean up  Ambulation/Gait             General Gait Details: unable   Stairs            Wheelchair Mobility    Modified Rankin (Stroke Patients Only)       Balance Overall balance assessment: Needs assistance Sitting-balance support: Bilateral upper extremity supported;Feet unsupported Sitting balance-Leahy Scale: Poor Sitting balance - Comments: pt can maintain sitting only with bilateral UE support, when she let go with either UE, would lose balance posterior with max A to correct. Even with bilateral UE support, could not tolerate perturbation in any direction. Poor trunk control and severely diminshed balance reactions Postural control: Posterior lean Standing balance support: Bilateral upper extremity supported;During functional activity Standing balance-Leahy Scale: Zero                      Cognition Arousal/Alertness: Awake/alert Behavior During Therapy: WFL for tasks assessed/performed Overall Cognitive Status: Impaired/Different from baseline Area of Impairment: Problem solving;Safety/judgement       Following Commands: Follows one step commands consistently Safety/Judgement: Decreased awareness of safety;Decreased awareness of deficits   Problem Solving: Decreased initiation;Slow processing;Requires tactile cues;Requires verbal cues General Comments: pt appropriate throughout session but with decreased awareness of body in  space, loses balance posteriorly without warning or protective reactions, flat affect in regards to this. Was distressed today by smell and feeling not clean    Exercises General Exercises - Lower Extremity Heel Slides: AAROM;5  reps;Both;Supine    General Comments        Pertinent Vitals/Pain Pain Assessment: Faces Faces Pain Scale: Hurts little more Pain Location: right hip with legs off bed, reports decreased pain overall in legs Pain Intervention(s): Monitored during session    Home Living                      Prior Function            PT Goals (current goals can now be found in the care plan section) Acute Rehab PT Goals Patient Stated Goal: Return home PT Goal Formulation: With patient Time For Goal Achievement: 03/02/14 Potential to Achieve Goals: Fair Progress towards PT goals: Progressing toward goals    Frequency  Min 2X/week    PT Plan Current plan remains appropriate    Co-evaluation             End of Session   Activity Tolerance: Patient limited by fatigue Patient left: in bed;with call bell/phone within reach;with bed alarm set     Time: 1021-1050 PT Time Calculation (min) (ACUTE ONLY): 29 min  Charges:  $Therapeutic Activity: 23-37 mins                    G Codes:     Lyanne CoVictoria Sharissa Brierley, PT  Acute Rehab Services  [phone]  Lyanne CoManess, Shelsy Seng 02/22/2014, 12:15 PM

## 2014-02-22 NOTE — Progress Notes (Signed)
NUTRITION FOLLOW UP  Intervention:   -Continue Ensure Complete po BID, each supplement provides 350 kcal and 13 grams of protein -Continue 30 ml Prostat BID -Add Magic Cup BID with meals  Nutrition Dx:   Inadequate oral intake related to inability to eat, acute encephalopathy as evidenced by NPO status, ongoing.  Goal:   Intake to meet >90% of estimated nutrition needs; goal not met  Monitor:   PO/supplement intake, labs, weight changes, I/O's  Assessment:   43 year old Female with PMH of HTN, marijuana use who presented to [facility] ED 01/27/2014 after she was found unresponsive at home by a family member. CT head showed no acute intracranial findings. Chest x-ray showed no active disease. Patient has received fluid boluses in ED. Patient was admitted for further evaluation and management of altered mental status, dehydration.  Patient with + HIV test, new diagnosis.  Intake has decreased. Noted meal intake of PO: 0-15%. Noted 0% meal completion over the past 2 days. Pt is refusing supplements approximately 50% of the time.  Noted a 10.5% wt loss x 7 days, likely due to poor po intake and increased nutritional needs due to multiple medical problems.  Discharge disposition is home with 24 hour care. Labs reviewed. K: 3.6, BUN/Creat: 52/1.92, Calcium: 8.1, Phos: 7.1.   Height: Ht Readings from Last 1 Encounters:  02/09/14 5' 7"  (1.702 m)    Weight Status:   Wt Readings from Last 1 Encounters:  02/22/14 110 lb 4.8 oz (50.032 kg)   02/17/14 123 lb 10.9 oz (56.1 kg)   Re-estimated needs:  Kcal: 1700-1900 Protein: 75-85 grams Fluid: 1.7-1.9 L  Skin: MASD on buttocks, stage II pressure ulcer on sacrum  Diet Order: Diet regular   Intake/Output Summary (Last 24 hours) at 02/22/14 1331 Last data filed at 02/22/14 0400  Gross per 24 hour  Intake    430 ml  Output      0 ml  Net    430 ml    Last BM: 02/22/14   Labs:   Recent Labs Lab 02/16/14 0706 02/17/14 0544  02/18/14 0358 02/22/14 0520  NA 142 145  143 144 139  K 4.4 4.2  4.1 3.9 3.6*  CL 105 109  107 107 105  CO2 21 21  21 23 22   BUN 110* 99*  101* 96* 52*  CREATININE 3.16* 3.18*  3.14* 2.92* 1.92*  CALCIUM 8.7 8.4  8.5 8.4 8.1*  PHOS 8.3* 7.1*  --   --   GLUCOSE 75 77  74 75 88    CBG (last 3)   Recent Labs  02/22/14 0522 02/22/14 0750 02/22/14 1144  GLUCAP 95 84 99    Scheduled Meds: . sodium chloride   Intravenous Once  . abacavir  600 mg Per NG tube Daily  . alum & mag hydroxide-simeth  15 mL Oral Once  . amLODipine  10 mg Oral Daily  . antiseptic oral rinse  7 mL Mouth Rinse BID  . aspirin  81 mg Oral Daily  . azithromycin  1,200 mg Oral Weekly  . clindamycin  600 mg Oral 3 times per day  . dolutegravir  50 mg Oral BID  . feeding supplement (ENSURE COMPLETE)  237 mL Oral BID BM  . feeding supplement (PRO-STAT SUGAR FREE 64)  30 mL Oral BID BM  . fluconazole  100 mg Per Tube Weekly  . furosemide  20 mg Intravenous Once  . heparin subcutaneous  5,000 Units Subcutaneous 3 times per day  .  iron polysaccharides  150 mg Oral Daily  . labetalol  200 mg Oral BID  . lamiVUDine  100 mg Oral Daily  . levETIRAcetam  500 mg Oral BID  . pantoprazole  40 mg Oral Daily  . primaquine  30 mg Per Tube Q24H    Continuous Infusions:   Demoni Parmar A. Jimmye Norman, RD, LDN Pager: [phone] After hours Pager: [phone]

## 2014-02-22 NOTE — Plan of Care (Signed)
Problem: Phase III Progression Outcomes Goal: Voiding independently Outcome: Completed/Met Date Met:  02/22/14     

## 2014-02-22 NOTE — Progress Notes (Signed)
Patient was complaining of stomach distress; per MD order I offered Maalox 15cc po but she refused saying that the smell it makes her sick. Patient was monitored and offered again medication, but after one hour she said she is feeling better. Will continue to monitor.

## 2014-02-22 NOTE — Clinical Social Work Note (Signed)
CSW received call from patient's son Desiree Hale had questions regarding the patient's discharge. CSW explained what was explained to the patient (please see my previous note). Ethelene Hale agrees with patient and does not want [place] outside [place]. He states that he will ensure that the patient has 24 hr. Care at home (assistance from his fiance and other family members). He asks what special equipment the patient will need. CSW calling RNCM to notify of HH needs.   Desiree Hale Desiree Hale MSW, [place]WA, [place], [phone]

## 2014-02-22 NOTE — Progress Notes (Signed)
[facility]    Date of Admission:  01/27/2014   Total days of antibiotics            ID: Desiree Hale is a 43 y.o. 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 Still pretty weak in lower extremities  Medications:  . abacavir  600 mg Per NG tube Daily  . alum & mag hydroxide-simeth  15 mL Oral Once  . amLODipine  10 mg Oral Daily  . antiseptic oral rinse  7 mL Mouth Rinse BID  . aspirin  81 mg Oral Daily  . azithromycin  1,200 mg Oral Weekly  . clindamycin  600 mg Oral 3 times per day  . dolutegravir  50 mg Oral BID  . feeding supplement (ENSURE COMPLETE)  237 mL Oral BID BM  . feeding supplement (PRO-STAT SUGAR FREE 64)  30 mL Oral BID BM  . fluconazole  100 mg Per Tube Weekly  . heparin subcutaneous  5,000 Units Subcutaneous 3 times per day  . iron polysaccharides  150 mg Oral Daily  . labetalol  200 mg Oral BID  . lamiVUDine  100 mg Oral Daily  . levETIRAcetam  500 mg Oral BID  . pantoprazole  40 mg Oral Daily  . primaquine  30 mg Per Tube Q24H    Objective: Vital signs in last 24 hours: Temp:  [98 F (36.7 C)-99.8 F (37.7 C)] 98.7 F (37.1 C) (11/16 1948) Pulse Rate:  [74-79] 75 (11/16 1948) Resp:  [17-19] 17 (11/16 1948) BP: (120-144)/(61-68) 125/62 mmHg (11/16 1948) SpO2:  [99 %-100 %] 100 % (11/16  1948) Weight:  [110 lb 4.8 oz (50.032 kg)] 110 lb 4.8 oz (50.032 kg) (11/16 0554)  Physical Exam  Constitutional:  oriented to person, place, and time. appears well-developed and well-nourished. No distress.  HENT:  Mouth/Throat: Oropharynx is clear and moist. No oropharyngeal exudate.  Cardiovascular: Normal rate, regular rhythm and normal heart sounds. Exam reveals no gallop and no friction rub.  No murmur heard.  Pulmonary/Chest: Effort normal and breath sounds normal. No respiratory distress.  has no wheezes.  Abdominal: Soft. Bowel sounds are normal.  exhibits no distension. There is no tenderness.  Lymphadenopathy: no cervical adenopathy.  Neurological: proximal and distal weakness to lower extremities L>R.  Skin: Skin is warm and dry. No rash noted. No erythema.  Psychiatric: a normal mood and affect.  behavior is normal.    Lab Results  Recent Labs  02/22/14 0520 02/22/14 1215  WBC  --  6.2  HGB  --  6.3*  HCT  --  19.1*  NA 139  --   K 3.6*  --   CL 105  --   CO2 22  --   BUN 52*  --   CREATININE 1.92*  --    Liver Panel  Recent Labs  02/22/14 0520  PROT 5.5*  ALBUMIN 1.8*  AST 13  ALT 18  ALKPHOS 70  BILITOT 0.4   Microbiology:  Studies/Results: No results found.   Assessment/Plan: hiv = continue on tivicay, abacavir, and renally dosed lamivudine  pcp = nearly completed treatment and switch to prophylaxis dosing  Anemia = baseline of last week appeared closer to hgb of 8 now down to 6, consider iron studies, see if any gi loss or if this is all anemia of chronic disease. Will likely need repletion of red cells or iron infusion  oi proph = continue with azithromycin weekly, and fluconazole weekly  Myopathy = will need nerve conduction study as outpatient to see if myopathy vs. Neuropathy. Will need to convince to go to acute rehab as much as possible. Will need to discuss with family to encourage rehab vs. snf  Malnutrition = encourage improved  oral intake of protein supplementations  Maybelle Depaoli, [facility] Cell: [phone] Pager: [phone]  02/22/2014, 8:30 PM

## 2014-02-22 NOTE — Clinical Social Work Note (Signed)
Patient discussed in progression this morning. Per PA, patient's son Elberta Fortis has informed treatment team that he will not be able to take the patient home at discharge. CSW met with patient at bedside to discuss this. Patient states, "I just got off the phone with him and he said to come on home. Just make sure I have a walker and a bedside toilet." CSW inquired about whether or not patient would reconsider SNF placement. Patient stated that she would only consider placement in [facility]. CSW shared again with patient that we have not received any SNF offers from from facilities in [facility] (or anywhere in [place]). Patient states that she wants to go home since this is not the case. Patient and CSW attempted to call [facility]. 3X with phone in patient's room. CSW also called and left a message from CSW's work phone. Patient states, "Sir, I wouldn't lie to you, he said that I can come home. He said that his fiance will stay with me at home when he's at work. My daughters and nieces will also help me take care of me." CSW will continue to try and reach Elberta Fortis to determine if a DC home is going to be possible as the patient wants this.    Liz Beach MSW, [place], [place], [phone]

## 2014-02-22 NOTE — Progress Notes (Signed)
CRITICAL VALUE ALERT  Critical value received: Hgb=6.3  Date of notification:  02/22/2014  Time of notification:  1303  Critical value read back:Yes.    Nurse who received alert:  Lurena NidaGreta Ande Therrell  MD notified (1st page):  Dr. Arbutus Leasat  Time of first page:  1305  MD notified (2nd page):  Time of second page:  Responding MD: new order for blood 2 units  Time MD responded:  1310

## 2014-02-22 NOTE — Progress Notes (Signed)
PROGRESS NOTE  Desiree Hale ZOX:[id number] DOB: 1970/06/13 DOA: 01/27/2014 PCP: No primary care provider on file.  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 She was initially admitted with acute encephalopathy on the hospitalist service, then decompensated from a respiratory standpoint and was intubated 10/28. Patient's respiratory status has gradually improved and was able to be extubated on 11/5, and currently she is comfortable on room air. Her course was complicated by acute renal failure thought to be multifactorial due to nephrotoxic agents, ATN. Her acute encephalopathy has been very slow to improve and she has intermittent periods of confusion alternating with alertness. On 11/10 her cognitive ability was significantly improved. On 11/13 I believe she is at baseline.  Infectious disease, nephrology, PCCM and as well as palliative care have been involved in her care.  Assessment/Plan: Acute hypoxemic respiratory failure Resolved. 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 Patient intubated from 10/28 until 11/5. Now breathing comfortably on room air.  -CXR 11/5 clear  Pneumocystis pneumonia - continue antibiotics (11/16 is day 19/21). Patient is now asymptomatic. Discontinued prednisone after 11/13. She is on clindamycin, primaquine. Being followed by ID. Per Dr. Algis Liming, when antibiotics are complete start Dapsone 100 mg daily to continue indefinitely. Patient should follow up with his office one time after discharge. Then she prefers to follow in [facility].  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 The patient received 3 weeks of IV acyclovir. Mental status much improved.    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 -Unable to left  legs against gravity. -MRI of the spine showed nonspecific perispinal edema. -PT Eval completed recommended SNF. Patient refuses SNF (outside of [facility]) and plans to go home with her son who agrees to provide 24 hour support.  Normocytic Anemia -Likely due to Acute illness, AKI, and blood draws. -No signs of bleeding or significant bruising. -Will order 2 units of PRBCs to be given 11/16  Elevated troponin level - in the setting of demand ischemia and acute renal failure. Patient denies chest pain.  Acute on chronic diastolic heart failure - On beta blocker. No sign of volume overload presently  Acute on chronic renal failure with metabolic acidosis - Likely ATN from antibioitics with possible ischemia from hypotension. 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 BUN/CR is down trending (1.92). Bicarb has now normalized. Nephrology has followed her 2x during this hospitalization and has signed off.  Protein calorie malnutrition - nutrition consulted.  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 Patient first learned of her HIV diagnosis in 2008. On fluconazole weekly. Patient has medicaid approval as of 02/07/2014 and there fore will be able to obtain her medications on d/c.  Seizure disorder - stable.  -continue Keppra  Hypertension - moderately controlled. Added norvasc 11/11.  -02/20/14-increase amlodipine to 10mg  daily -continue Normodyne  Family communication: patient is alert and orientated.   Procedures/Studies: Ct Head Wo Contrast  01/27/2014   CLINICAL DATA:  Found unresponsive.  Altered mental status.  EXAM: CT HEAD WITHOUT CONTRAST  CT CERVICAL SPINE WITHOUT CONTRAST  TECHNIQUE: Multidetector CT imaging of the head and cervical spine was performed  following the standard protocol without intravenous contrast. Multiplanar CT image reconstructions of the cervical spine were also generated.  COMPARISON:  None.  FINDINGS: CT HEAD FINDINGS  The ventricles are normal in size and configuration. No extra-axial fluid collections are identified. The gray-white differentiation is normal. No CT findings for acute intracranial process such as hemorrhage or infarction. No mass lesions. The brainstem and cerebellum are grossly normal.  The bony structures are intact. The paranasal sinuses and mastoid air cells are clear. The globes are intact.  CT CERVICAL SPINE FINDINGS  Normal alignment of the cervical vertebral bodies. Disc spaces and vertebral bodies are maintained. No acute fracture or abnormal prevertebral soft tissue swelling. The facets are normally aligned. The skullbase C1 and C1-2 articulations are maintained. The dens is normal. No large disc protrusions, spinal or foraminal stenosis. The lung apices are clear. Emphysematous changes are noted. There is some air in the supraclavicular fossa possibly due to a laceration. No pneumothorax is identified.  IMPRESSION: No acute intracranial findings or skull fracture.  Normal CT examination of the cervical spine. No cervical spine fracture.   Electronically Signed   By: Loralie Champagne M.D.   On: 01/27/2014 17:27   Ct Cervical Spine Wo Contrast  01/27/2014   CLINICAL DATA:  Found unresponsive.  Altered mental status.  EXAM: CT HEAD WITHOUT CONTRAST  CT CERVICAL SPINE WITHOUT CONTRAST  TECHNIQUE: Multidetector CT imaging of the head and cervical spine was performed following the standard protocol without intravenous contrast. Multiplanar CT image reconstructions of the cervical spine were also generated.  COMPARISON:  None.  FINDINGS: CT HEAD FINDINGS  The ventricles are normal in size and configuration. No extra-axial fluid collections are identified. The gray-white differentiation is normal. No CT findings for  acute intracranial process such as hemorrhage or infarction. No mass lesions. The brainstem and cerebellum are grossly normal.  The bony structures are intact. The paranasal sinuses and mastoid air cells are clear. The globes are intact.  CT CERVICAL SPINE FINDINGS  Normal alignment of the cervical vertebral bodies. Disc spaces and vertebral bodies are maintained. No acute fracture or abnormal prevertebral soft tissue swelling. The facets are normally aligned. The skullbase C1 and C1-2 articulations are maintained. The dens is normal. No large disc protrusions, spinal or foraminal stenosis. The lung apices are clear. Emphysematous changes are noted. There is some air in the supraclavicular fossa possibly due to a laceration. No pneumothorax is identified.  IMPRESSION: No acute intracranial findings or skull fracture.  Normal CT examination of the cervical spine. No cervical spine fracture.   Electronically Signed   By: Loralie Champagne M.D.   On: 01/27/2014 17:27   Mr Brain Wo Contrast  01/28/2014   CLINICAL DATA:  Unresponsive. The patient had an episode with loss of consciousness. She has been aphasic since that episode despite regaining who consciousness otherwise.  EXAM: MRI HEAD WITHOUT CONTRAST  TECHNIQUE: Multiplanar, multiecho pulse sequences of the brain and surrounding structures were obtained without intravenous contrast.  COMPARISON:  CT head without contrast 01/27/2014.  FINDINGS: Abnormal cortical and subcortical T2 hyperintensity is evident in the posterior insular cortex and along the sylvian fissure bilaterally. There is abnormal signal extending into the operculum bilaterally, left greater than right. This signal abnormality extends nearly to the vertex within the precentral sulcus. There is no significant associated restricted diffusion. No hemorrhage or mass lesion is present. There is no significant mass effect.  Flow is present in the major intracranial arteries. The globes and  orbits are  intact. Mild mucosal thickening is noted in the ethmoid air cells and sphenoid sinuses bilaterally.  The coronal T2 weighted images are somewhat degraded by patient motion. No definite hippocampal abnormality is evident.  IMPRESSION: 1. Diffuse cortical and subcortical signal abnormality bilaterally within the temporal and posterior frontal lobes as described. This is most likely a postictal phenomenon. There is no restricted diffusion to suggest acute or subacute ischemia. No mass lesion is evident. This could be related to elevated blood pressure similar to posterior reversible encephalopathy syndrome. The distribution is atypical. Recommend follow-up MRI of the brain without and with contrast in 2-3 weeks as the symptoms resolve. 2. Minimal sinus disease.   Electronically Signed   By: Gennette Pachris  Mattern M.D.   On: 01/28/2014 10:27   Mr Lumbar Spine Wo Contrast  02/18/2014   CLINICAL DATA:  Weakness in bilateral lower extremities. Encephalopathy.  EXAM: MRI LUMBAR SPINE WITHOUT CONTRAST  TECHNIQUE: Multiplanar, multisequence MR imaging of the lumbar spine was performed. No intravenous contrast was administered.  COMPARISON:  None.  FINDINGS: Examination is quite limited due to patient's condition. He would not cooperate with any further imaging.  The T2 sagittal sequence demonstrates normal alignment of the lumbar vertebral bodies. They demonstrate grossly normal signal intensity. The intervertebral disc spaces are maintained with normal T2 signal intensity. The conus medullaris terminates at L1. The spinal canal is quite generous. There are no disc protrusions, spinal or foraminal stenosis. No findings for diskitis or osteomyelitis. No findings for septic facet arthritis.  There is diffuse edema like signal abnormality in paraspinal muscles which is nonspecific myositis. This could be inflammatory such as polymyositis or dermatomyositis, posttraumatic, drug induced, etc.  IMPRESSION: Normal alignment of the  lumbar vertebral bodies and no acute bony findings.  No disc protrusions, spinal or foraminal stenosis.  Nonspecific edema like signal abnormality in the paraspinal muscles as discussed above.   Electronically Signed   By: Loralie ChampagneMark  Gallerani M.D.   On: 02/18/2014 17:27   [place] Renal  02/05/2014   CLINICAL DATA:  Acute renal insufficiency  EXAM: RENAL/URINARY TRACT ULTRASOUND COMPLETE  COMPARISON:  None.  FINDINGS: Right Kidney:  Length: 10.0 cm. Echogenicity is diffusely increased. The renal cortical thickness is normal. There is no appreciable mass or perinephric fluid. There is slight fullness of the renal collecting system without focal obstruction site seen. There is no sonographically demonstrable calculus or ureterectasis.  Left Kidney:  Length: 10.4 cm. Echogenicity is diffusely increased. Renal cortical thickness is normal. No mass, pelvicaliectasis, or perinephric fluid collection. There is no sonographically demonstrable calculus or ureterectasis. Visualized.  Bladder:  Appears normal for degree of bladder distention. A Foley catheter is present within the urinary bladder. There remains some urine in the urinary bladder currently, however.  IMPRESSION: Both kidneys are diffusely echogenic, a finding that may be seen with medical renal disease. The renal cortical thickness is normal bilaterally. There is slight fullness of the right renal collecting system of uncertain etiology. No pelvicaliectasis is seen on the left. No renal masses are identified on either side.   Electronically Signed   By: Bretta BangWilliam  Woodruff M.D.   On: 02/05/2014 16:42   Dg Chest Port 1 View  02/11/2014   CLINICAL DATA:  Ventilated patient; history of HIV knee, acute respiratory failure and aspiration pneumonia  EXAM: PORTABLE CHEST - 1 VIEW  COMPARISON:  Portable chest x-ray of February 10, 2014.  FINDINGS: The lungs are well-expanded and clear. The heart and mediastinal structures are  normal. There is no pleural effusion or  pneumothorax. The endotracheal tube tip lies 5.1 cm above the crotch of the carina. The left internal jugular venous catheter tip projects over the mid to distal SVC. The feeding tube tip projects below the inferior margin of the image. The bony thorax is unremarkable.  IMPRESSION: There is no evidence of pneumonia nor other acute cardiopulmonary abnormality.   Electronically Signed   By: David  [place]   On: 02/11/2014 07:48   Dg Chest Port 1 View  02/10/2014   CLINICAL DATA:  Check endotracheal tube  EXAM: PORTABLE CHEST - 1 VIEW  COMPARISON:  02/08/2014  FINDINGS: Cardiac shadow is stable. A feeding catheter and left jugular central line are again seen and stable. An endotracheal tube is noted now all at 2.6 cm above the carina. This appears to have been advanced slightly in the interval from the prior exam. The lungs are well aerated without focal infiltrate  IMPRESSION: Tubes and lines as described above.  Interval clearing of the changes in the right lung base medially. No focal infiltrate is seen.   Electronically Signed   By: Alcide Clever M.D.   On: 02/10/2014 08:01   Dg Chest Port 1 View  02/08/2014   CLINICAL DATA:  Respiratory failure.  EXAM: PORTABLE CHEST - 1 VIEW  COMPARISON:  02/07/2014.  FINDINGS: Tracheostomy to, feeding tube, left IJ line in stable position. Mediastinum and hilar structures are unremarkable. Heart size stable. Mild right lower lobe infiltrate cannot be excluded. No pleural effusion or pneumothorax. Left costophrenic angle not imaged.  IMPRESSION: 1. Line and tubes in stable position. 2. Mild infiltrate right lower lobe cannot be excluded.   Electronically Signed   By: Maisie Fus  Register   On: 02/08/2014 07:18   Dg Chest Port 1 View  02/07/2014   CLINICAL DATA:  Respiratory failure/hypoxia  EXAM: PORTABLE CHEST - 1 VIEW  COMPARISON:  February 06, 2014  FINDINGS: Endotracheal tube tip is 5.8 cm above the carina. Central catheter tip is at the cavoatrial junction. Feeding tube  tip is below the diaphragm. No pneumothorax. Lungs are clear. Heart size and pulmonary vascularity are normal. No adenopathy. No bone lesions.  IMPRESSION: Tube and catheter positions as described without pneumothorax. No edema or consolidation.   Electronically Signed   By: Bretta Bang M.D.   On: 02/07/2014 11:04   Dg Chest Port 1 View  02/06/2014   CLINICAL DATA:  Pneumocystis carinii pneumonia.  EXAM: PORTABLE CHEST - 1 VIEW  COMPARISON:  02/05/2014 and 02/04/2014  FINDINGS: Endotracheal tube, central catheter, and feeding tube in place, unchanged. Heart size and vascularity are normal. Right lung is now clear. Slight hazy infiltrate at the left base has almost resolved. No effusions.  IMPRESSION: Minimal residual hazy infiltrate at the left base.   Electronically Signed   By: Geanie Cooley M.D.   On: 02/06/2014 06:07   Dg Chest Port 1 View  02/05/2014   CLINICAL DATA:  Acute respiratory failure.  EXAM: PORTABLE CHEST - 1 VIEW  COMPARISON:  02/04/2014  FINDINGS: Endotracheal tube is 4.9 cm above the carina. Feeding tube extends into the abdomen. Patient is rotated towards the left which limits evaluation of the central line placement. The left jugular central line tip is in the region of the lower SVC. Concern for subtle airspace densities in the right lower lung. Heart size is normal. Improved aeration at the left lung base. There may be some residual airspace densities in the left  mid lung.  IMPRESSION: Residual airspace densities at the right lung base and left mid lung. Improved aeration at the left lung base.  Support apparatuses as described.   Electronically Signed   By: Richarda Overlie M.D.   On: 02/05/2014 07:43   Dg Chest Portable 1 View  02/04/2014   CLINICAL DATA:  Pneumonia  EXAM: PORTABLE CHEST - 1 VIEW  COMPARISON:  02/03/2014  FINDINGS: Cardiac shadow is stable. A left jugular central line, feeding catheter and endotracheal tube are again seen and stable in position. The endotracheal  tube lies 3.8 cm above the carina. There is been improved aeration in the right lung base. Persistent left basilar changes are noted. Continued followup is recommended.  IMPRESSION: Improved aeration in the right lung base. Stable changes in the left lung base are seen.  Tubes and lines as described.   Electronically Signed   By: Alcide Clever M.D.   On: 02/04/2014 07:37   Portable Chest Xray  02/04/2014   CLINICAL DATA:  Line placement.  Respiratory failure.  EXAM: PORTABLE CHEST - 1 VIEW  COMPARISON:  02/03/2014 at 7:34 a.m.  FINDINGS: New left IJ catheter, tip at the upper cavoatrial junction. No evidence of pneumothorax.  New endotracheal tube, tip 1 cm above the carina. Feeding tube remains at least in the stomach.  Greater lung volumes after intubation. Bilateral airspace disease persists, focal in the right infrahilar lung can suspicious for pneumonia. No evidence of effusion or pneumothorax.  IMPRESSION: 1. New endotracheal tube, tip 1 cm above the carina. 2. New left IJ catheter is in good position.  No pneumothorax. 3. Improved lung volumes with persistent bilateral airspace disease (likely pneumonia).   Electronically Signed   By: Tiburcio Pea M.D.   On: 02/04/2014 01:07   Dg Chest Port 1 View  02/03/2014   CLINICAL DATA:  Persistent pneumonia  EXAM: PORTABLE CHEST - 1 VIEW  COMPARISON:  February 01, 2014  FINDINGS: Feeding tube tip is below the diaphragm. No pneumothorax. There is persistent left lower lobe consolidation. Patchy airspace consolidation in the right lower lobe is again noted. In comparison with the recent prior study, there is slightly less perihilar interstitial pneumonitis. No new infiltrate is appreciable. Heart size and pulmonary vascularity are normal. No adenopathy. No bone lesions.  IMPRESSION: Persistent airspace consolidation in the left lower lobe and to a lesser extent in the right base. There is slightly less perihilar interstitial pneumonitis compared to recent prior  study. No new opacity. No change in cardiac silhouette. No pneumothorax.   Electronically Signed   By: Bretta Bang M.D.   On: 02/03/2014 07:51   Dg Chest Port 1 View  02/01/2014   CLINICAL DATA:  Fever, aspiration pneumonia.  EXAM: PORTABLE CHEST - 1 VIEW  COMPARISON:  01/29/2014.  FINDINGS: Feeding tube is followed into the stomach with the tip projecting beyond the inferior margin of the image. Trachea is midline. Heart size stable. There is worsening bilateral airspace disease, somewhat perihilar predominant, right greater than left. No definite pleural fluid. No pneumothorax.  IMPRESSION: Worsening bilateral airspace disease, somewhat perihilar predominant, right greater than left, suspicious for pneumonia. Aspiration not excluded.   Electronically Signed   By: Leanna Battles M.D.   On: 02/01/2014 15:26   Dg Chest Port 1 View  01/29/2014   CLINICAL DATA:  Shortness of breath, unresponsive  EXAM: PORTABLE CHEST - 1 VIEW  COMPARISON:  01/27/2014  FINDINGS: The cardiac shadow is stable. The lungs are well  aerated bilaterally. Increasing density in the medial right lung base is noted consistent with atelectasis and/or early infiltrate. No pneumothorax or sizable effusion is seen.  IMPRESSION: Increased density in the right lung base related to atelectasis/early infiltrate.   Electronically Signed   By: Alcide CleverMark  Lukens M.D.   On: 01/29/2014 06:55   Dg Chest Portable 1 View  01/27/2014   CLINICAL DATA:  Altered mental status  EXAM: PORTABLE CHEST - 1 VIEW  COMPARISON:  None.  FINDINGS: The patient is rotated. There is no focal parenchymal opacity, pleural effusion, or pneumothorax. The heart and mediastinal contours are unremarkable.  The osseous structures are unremarkable.  IMPRESSION: No active disease.   Electronically Signed   By: Elige KoHetal  Patel   On: 01/27/2014 16:17   Dg Abd Portable 1v  01/31/2014   CLINICAL DATA:  NG tube placement.  EXAM: PORTABLE ABDOMEN - 1 VIEW  COMPARISON:  None.   FINDINGS: Bowel gas pattern is nonobstructive. A feeding tube follows the greater curvature of the stomach and terminates in the expected location of the antrum/pylorus.  IMPRESSION: Feeding tube terminates in the expected location of the antrum/pylorus.   Electronically Signed   By: Britta MccreedySusan  Turner M.D.   On: 01/31/2014 18:58   Dg Fluoro Guide Lumbar Puncture  01/30/2014   CLINICAL DATA:  43 year old female with fever and altered mental status. Suspicious for encephalitis.  EXAM: DIAGNOSTIC LUMBAR PUNCTURE UNDER FLUOROSCOPIC GUIDANCE  FLUOROSCOPY TIME:  51 seconds  PROCEDURE: Informed consent was obtained from the patient prior to the procedure, including potential complications of headache, allergy, and pain. With the patient prone, the lower back was prepped with Betadine. 1% Lidocaine was used for local anesthesia. Lumbar puncture was performed at the L3-L4 level using a 20 gauge needle with return of yellow tinged CSF with an opening pressure of 17 cm water. 10 ml of CSF were obtained for laboratory studies. The patient tolerated the procedure well and there were no apparent complications.  IMPRESSION: 1. Successful uncomplicated fluoroscopic guided lumbar puncture at L3-L4 with opening pressure of 17 cm of water, yielding consistently light yellow tinged CSF.   Electronically Signed   By: Trudie Reedaniel  Entrikin M.D.   On: 01/30/2014 17:02        Subjective: Patient denies fevers, chills, headache, chest pain, dyspnea, nausea, vomiting, diarrhea, abdominal pain, dysuria, hematuria.  She understands that she will receive two units of blood and go home tomorrow morning.   Objective: Filed Vitals:   02/21/14 1343 02/21/14 2226 02/22/14 0528 02/22/14 0554  BP: 124/60 141/66 144/68   Pulse: 69  79   Temp: 98.7 F (37.1 C) 98 F (36.7 C) 99.8 F (37.7 C)   TempSrc: Oral Oral Oral   Resp: 17 19 18    Height:      Weight:    50.032 kg (110 lb 4.8 oz)  SpO2: 100%  99%     Intake/Output Summary  (Last 24 hours) at 02/22/14 1442 Last data filed at 02/22/14 0400  Gross per 24 hour  Intake    430 ml  Output      0 ml  Net    430 ml   Weight change: -5.307 kg (-11 lb 11.2 oz) Exam:   General:  Pt is alert, follows commands appropriately, not in acute distress  HEENT: No icterus, No thrush,  [place]/AT  Cardiovascular: RRR, S1/S2, no rubs, no gallops  Respiratory: bibasilar rales. No wheezing. Good air movement.  Abdomen: Soft/+BS, non tender, non distended, no  guarding  Extremities: No edema, No lymphangitis, No petechiae, No rashes, no synovitis  Data Reviewed: Basic Metabolic Panel:  Recent Labs Lab 02/16/14 0706 02/17/14 0544 02/18/14 0358 02/22/14 0520  NA 142 145  143 144 139  K 4.4 4.2  4.1 3.9 3.6*  CL 105 109  107 107 105  CO2 21 21  21 23 22   GLUCOSE 75 77  74 75 88  BUN 110* 99*  101* 96* 52*  CREATININE 3.16* 3.18*  3.14* 2.92* 1.92*  CALCIUM 8.7 8.4  8.5 8.4 8.1*  PHOS 8.3* 7.1*  --   --    Liver Function Tests:  Recent Labs Lab 02/16/14 0706 02/17/14 0544 02/18/14 0358 02/22/14 0520  AST  --   --  20 13  ALT  --   --  36* 18  ALKPHOS  --   --  62 70  BILITOT  --   --  0.4 0.4  PROT  --   --  5.1* 5.5*  ALBUMIN 1.8* 1.7* 1.9* 1.8*   No results for input(s): LIPASE, AMYLASE in the last 168 hours. No results for input(s): AMMONIA in the last 168 hours. CBC:  Recent Labs Lab 02/16/14 0706 02/18/14 0358 02/22/14 1215  WBC 6.0 5.5 6.2  NEUTROABS  --   --  5.4  HGB 8.1* 7.5* 6.3*  HCT 25.3* 22.9* 19.1*  MCV 81.6 79.5 82.3  PLT 215 181 172   Cardiac Enzymes:  Recent Labs Lab 02/18/14 0855  CKTOTAL 49   BNP: Invalid input(s): POCBNP CBG:  Recent Labs Lab 02/21/14 2016 02/22/14 0014 02/22/14 0522 02/22/14 0750 02/22/14 1144  GLUCAP 90 93 95 84 99    Recent Results (from the past 240 hour(s))  Clostridium Difficile by PCR     Status: None   Collection Time: 02/17/14  2:36 AM  Result Value Ref Range Status   C  difficile by pcr NEGATIVE NEGATIVE Final     Scheduled Meds: . sodium chloride   Intravenous Once  . abacavir  600 mg Per NG tube Daily  . alum & mag hydroxide-simeth  15 mL Oral Once  . amLODipine  10 mg Oral Daily  . antiseptic oral rinse  7 mL Mouth Rinse BID  . aspirin  81 mg Oral Daily  . azithromycin  1,200 mg Oral Weekly  . clindamycin  600 mg Oral 3 times per day  . dolutegravir  50 mg Oral BID  . feeding supplement (ENSURE COMPLETE)  237 mL Oral BID BM  . feeding supplement (PRO-STAT SUGAR FREE 64)  30 mL Oral BID BM  . fluconazole  100 mg Per Tube Weekly  . furosemide  20 mg Intravenous Once  . heparin subcutaneous  5,000 Units Subcutaneous 3 times per day  . iron polysaccharides  150 mg Oral Daily  . labetalol  200 mg Oral BID  . lamiVUDine  100 mg Oral Daily  . levETIRAcetam  500 mg Oral BID  . pantoprazole  40 mg Oral Daily  . primaquine  30 mg Per Tube Q24H   Continuous Infusions:    Romualdo Bolk [facility] Pager [phone]  If 7PM-7AM, please contact night-coverage [email or web address] Password [email or web address] 02/22/2014, 2:42 PM   LOS: 26 days

## 2014-02-22 NOTE — Plan of Care (Signed)
Problem: Phase II Progression Outcomes Goal: ADLs completed with minimal assistance Outcome: Not Met (add Reason) Pt is unable to do her ADL by herself

## 2014-02-23 LAB — TYPE AND SCREEN
ABO/RH(D): A POS
Antibody Screen: NEGATIVE
UNIT DIVISION: 0
Unit division: 0

## 2014-02-23 LAB — GLUCOSE, CAPILLARY
GLUCOSE-CAPILLARY: 100 mg/dL — AB (ref 70–99)
Glucose-Capillary: 86 mg/dL (ref 70–99)
Glucose-Capillary: 93 mg/dL (ref 70–99)
Glucose-Capillary: 138 mg/dL — ABNORMAL HIGH (ref 70–99)
Glucose-Capillary: 193 mg/dL — ABNORMAL HIGH (ref 70–99)

## 2014-02-23 LAB — CBC
HCT: 25.9 % — ABNORMAL LOW (ref 36.0–46.0)
HEMOGLOBIN: 8.9 g/dL — AB (ref 12.0–15.0)
MCH: 28.6 pg (ref 26.0–34.0)
MCHC: 34.4 g/dL (ref 30.0–36.0)
MCV: 83.3 fL (ref 78.0–100.0)
Platelets: 147 10*3/uL — ABNORMAL LOW (ref 150–400)
RBC: 3.11 MIL/uL — ABNORMAL LOW (ref 3.87–5.11)
RDW: 17.3 % — ABNORMAL HIGH (ref 11.5–15.5)
WBC: 5.3 10*3/uL (ref 4.0–10.5)

## 2014-02-23 LAB — MAGNESIUM: MAGNESIUM: 1.3 mg/dL — AB (ref 1.5–2.5)

## 2014-02-23 LAB — BASIC METABOLIC PANEL
Anion gap: 16 — ABNORMAL HIGH (ref 5–15)
BUN: 41 mg/dL — AB (ref 6–23)
CALCIUM: 8.1 mg/dL — AB (ref 8.4–10.5)
CO2: 20 meq/L (ref 19–32)
Chloride: 101 mEq/L (ref 96–112)
Creatinine, Ser: 1.75 mg/dL — ABNORMAL HIGH (ref 0.50–1.10)
GFR calc Af Amer: 40 mL/min — ABNORMAL LOW (ref >90)
GFR, EST NON AFRICAN AMERICAN: 34 mL/min — AB (ref >90)
Glucose, Bld: 97 mg/dL (ref 70–99)
Potassium: 3.2 mEq/L — ABNORMAL LOW (ref 3.7–5.3)
SODIUM: 137 meq/L (ref 137–147)

## 2014-02-23 MED ORDER — HYDROCODONE-ACETAMINOPHEN 5-325 MG PO TABS: 1.0000 | ORAL_TABLET | Freq: Four times a day (QID) | ORAL | Status: AC | PRN

## 2014-02-23 MED ORDER — DAPSONE 100 MG PO TABS
100.0000 mg | ORAL_TABLET | Freq: Every day | ORAL | Status: DC
Start: 2014-02-25 — End: 2014-02-23

## 2014-02-23 MED ORDER — DOLUTEGRAVIR SODIUM 50 MG PO TABS: 50.0000 mg | ORAL_TABLET | Freq: Two times a day (BID) | ORAL | Status: AC

## 2014-02-23 MED ORDER — AMLODIPINE BESYLATE 10 MG PO TABS: 10.0000 mg | ORAL_TABLET | Freq: Every day | ORAL | Status: AC

## 2014-02-23 MED ORDER — PRIMAQUINE PHOSPHATE 26.3 MG PO TABS: 30.0000 mg | ORAL_TABLET | ORAL | Status: DC

## 2014-02-23 MED ORDER — LAMIVUDINE 150 MG PO TABS: 150.0000 mg | ORAL_TABLET | Freq: Two times a day (BID) | ORAL | Status: AC

## 2014-02-23 MED ORDER — ASPIRIN 81 MG PO CHEW: 81.0000 mg | CHEWABLE_TABLET | Freq: Every day | ORAL | Status: AC

## 2014-02-23 MED ORDER — DAPSONE 100 MG PO TABS: 100.0000 mg | ORAL_TABLET | Freq: Every day | ORAL | Status: AC

## 2014-02-23 MED ORDER — LAMIVUDINE 100 MG PO TABS: 100.0000 mg | ORAL_TABLET | Freq: Two times a day (BID) | ORAL | Status: DC

## 2014-02-23 MED ORDER — CLINDAMYCIN HCL 300 MG PO CAPS: 600.0000 mg | ORAL_CAPSULE | Freq: Three times a day (TID) | ORAL | Status: DC

## 2014-02-23 MED ORDER — DAPSONE 100 MG PO TABS: 100.0000 mg | ORAL_TABLET | Freq: Every day | ORAL | Status: DC

## 2014-02-23 MED ORDER — LEVETIRACETAM 500 MG PO TABS: 500.0000 mg | ORAL_TABLET | Freq: Two times a day (BID) | ORAL | Status: AC

## 2014-02-23 MED ORDER — AZITHROMYCIN 600 MG PO TABS: 1200.0000 mg | ORAL_TABLET | ORAL | Status: AC

## 2014-02-23 MED ORDER — ENSURE COMPLETE PO LIQD: 237.0000 mL | Freq: Two times a day (BID) | ORAL | Status: AC

## 2014-02-23 MED ORDER — LAMIVUDINE 10 MG/ML PO SOLN: 150.0000 mg | Freq: Every day | ORAL | Status: DC

## 2014-02-23 MED ORDER — FLUCONAZOLE 40 MG/ML PO SUSR: 100.0000 mg | ORAL | Status: AC

## 2014-02-23 MED ORDER — PANTOPRAZOLE SODIUM 40 MG PO TBEC: 40.0000 mg | DELAYED_RELEASE_TABLET | Freq: Every day | ORAL | Status: AC

## 2014-02-23 MED ORDER — POLYSACCHARIDE IRON COMPLEX 150 MG PO CAPS: 150.0000 mg | ORAL_CAPSULE | Freq: Every day | ORAL | Status: AC

## 2014-02-23 MED ORDER — ABACAVIR SULFATE 300 MG PO TABS: 600.0000 mg | ORAL_TABLET | Freq: Every day | ORAL | Status: AC

## 2014-02-23 MED ORDER — LABETALOL HCL 200 MG PO TABS: 200.0000 mg | ORAL_TABLET | Freq: Two times a day (BID) | ORAL | Status: AC

## 2014-02-23 NOTE — Clinical Social Work Note (Signed)
Per MD patient ready to DC back to home to live with son. Patient was unwilling to consider [place] [facility] [place] [facility], and unfortunately CSW did not receive any [place] offers [place]. RN, patient/family Ethelene Browns([facility] [place] notified of patient's DC. DC packet on patient's chart. Ambulance transport requested for patient at 4:30PM (PTAR states there will be a 2+ hour wait). CSW was able to confirm with [facility] and [facility] that they are ready to receive the patient. Anthony's address was confirmed with [facility]lene [place] and [place]. CSW signing off at this time.

## 2014-02-23 NOTE — Progress Notes (Addendum)
[facility]    Date of Admission:  01/27/2014   Total days of antibiotics            ID: Desiree BeamsKristy Hale is a 43 y.o. 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 abacavir  600 mg Per NG tube Daily  . alum & mag hydroxide-simeth  15 mL Oral Once  . amLODipine  10 mg Oral Daily  . antiseptic oral rinse  7 mL Mouth Rinse BID  . aspirin  81 mg Oral Daily  . azithromycin  1,200 mg Oral Weekly  . clindamycin  600 mg Oral 3 times per day  . dolutegravir  50 mg Oral BID  . feeding supplement (ENSURE COMPLETE)  237 mL Oral BID BM  . feeding supplement (PRO-STAT SUGAR FREE 64)  30 mL Oral BID BM  . fluconazole  100 mg Per Tube Weekly  . heparin subcutaneous  5,000 Units Subcutaneous 3 times per day  . iron polysaccharides  150 mg Oral Daily  . labetalol  200 mg Oral BID  . lamiVUDine  100 mg Oral Daily  . levETIRAcetam  500 mg Oral BID  . pantoprazole  40 mg Oral Daily  . primaquine  30 mg Per Tube Q24H    Objective: Vital signs in last 24 hours: Temp:  [98.1 F (36.7 C)-99.3 F (37.4 C)] 98.5 F (36.9 C) (11/17 0546) Pulse Rate:  [59-76] 72 (11/17 0546) Resp:  [16-18] 18 (11/17 0546) BP: (120-158)/(61-78) 150/78 mmHg (11/17 0546) SpO2:  [99 %-100 %] 100 % (11/17 0546) Weight:  [110 lb 3.7 oz  (50 kg)] 110 lb 3.7 oz (50 kg) (11/17 0546)  Physical Exam  Constitutional:  oriented to person, place, and time. appears well-developed and well-nourished. No distress.  HENT:  Mouth/Throat: Oropharynx is clear and moist. No oropharyngeal exudate.  Cardiovascular: Normal rate, regular rhythm and normal heart sounds. Exam reveals no gallop and no friction rub.  No murmur heard.  Pulmonary/Chest: Effort normal and breath sounds normal. No respiratory distress.  has no wheezes.  Abdominal: Soft. Bowel sounds are normal.  exhibits no distension. There is no tenderness.  Lymphadenopathy: no cervical adenopathy.  Neurological: proximal and distal weakness to lower extremities L>R.  Skin: Skin is warm and dry. No rash noted. No erythema.  Psychiatric: a normal mood and affect.  behavior is normal.    Lab Results  Recent Labs  02/22/14 0520 02/22/14 1215 02/23/14 0624  WBC  --  6.2 5.3  HGB  --  6.3* 8.9*  HCT  --  19.1* 25.9*  NA 139  --  137  K 3.6*  --  3.2*  CL 105  --  101  CO2 22  --  20  BUN 52*  --  41*  CREATININE 1.92*  --  1.75*   Liver Panel  Recent Labs  02/22/14 0520  PROT 5.5*  ALBUMIN 1.8*  AST 13  ALT 18  ALKPHOS 70  BILITOT 0.4   Microbiology:  Studies/Results: No results found.   Assessment/Plan: hiv = continue on tivicay bid, abacavir, and increase dose to lamivudine 150mg  daily. May be able to change to triumeq as outpatient next week  pcp = finished treatment with clindamycin and primaquine. We will switch her to dapsone 100mg  a day for prophylaxis  Anemia = hgb up to 8 after 2 units rbc transfusion. Recommend weekly cbc x 4 for now  oi proph = continue with azithromycin weekly, and fluconazole weekly  Myopathy = will need nerve conduction study as outpatient to see if myopathy vs. Neuropathy. Will need to convince to go to acute rehab as much as possible. Will need to discuss with family to encourage rehab vs. snf  Malnutrition = encourage  improved oral intake of protein supplementations  dispo = please encourage family to bring back to hospital if they feel too overwhelmed with home responsibility. i do not feel that this is optimal for her care, and have appreciated primary care team attempting to persuade patient to go to reahb   Has appt with dr. Zenaida NieceVan dam at 11/25th at 3:15pm  [facility] Cell: [phone] Pager: [phone]  02/23/2014, 10:00 AM

## 2014-02-23 NOTE — Discharge Instructions (Signed)
Dr. Daiva EvesVan Dam recommends [facility] [address] as they stock these medications and are very familiar with them.  Primaquine and Clindamycin are going to be complete on 11/18.  (only today and tomorrow left).  Please start taking Dapsone 100 mg daily on 11/19.  [facility] ([facility] and make sure you have 1 follow up appointment in 2-3 weeks.  Then you are welcome to seek follow up [place] [facility] for your convenience.   If your leg strength does not improve at all with exercise in the next few weeks please consider an appointment with Dr. Allena [facility]Patel of [facility] for a nerve conduction study.    Please take care - you are an absolute Joy to care for  :)

## 2014-02-23 NOTE — Progress Notes (Signed)
CARE MANAGEMENT NOTE 02/23/2014  Patient:  Desiree Hale, Desiree Hale   Account Number:  [id number]  Date Initiated:  01/28/2014  Documentation initiated by:  Lorne Skeens  Subjective/Objective Assessment:   Patient was admitted with seizures.  Admitted from home.     Action/Plan:   Will follow for discharge needs pending PT/OT evals and physician orders.   Anticipated DC Date:  02/22/2014   Anticipated DC Plan:  [place]  In-house referral  Clinical Social Worker  Development worker, community      DC Planning Services  CM consult      Choice offered to / List presented to:  C-1 Patient   DME arranged  [place]      DME agency  [facility] arranged  [place] RN      [place].   Status of service:  Completed, signed off Medicare Important Message given?   (If response is "NO", the following Medicare IM given date fields will be blank) Date Medicare IM given:   Medicare IM given by:   Date Additional Medicare IM given:   Additional Medicare IM given by:    Discharge Disposition:  [place]  Per UR Regulation:  Reviewed for med. necessity/level of care/duration of stay  If discussed at [place] of Stay Meetings, dates discussed:   02/02/2014    Comments:  02/19/14 Sharolyn Douglas met with SSW to discuss dc planning. pt wants to go home to son Elberta Fortis) house [place] phone # [phone]. Met with pt to discuss dc needs, pt requesting rolling walker, bsc, wheelchair & hoyer lift. Discussed service options. All choices for [place] and DME given to pt, and choice was for [facility] as family has used this agency in past. Call to [place] and discussed case will need RN & HHA as MA pt. Call to [facility] with advanced DME and informed of DME needs. Call to [facility] and pt will not be ready for  dc until Monday as awaiting ADAP approval. discussed with pt.    02/02/14 [place] with dtr, Hilary Hertz [phone]), who states pt does not have [place] POA.  She also states that pt has been living with her recently and she would like to become [facility] POA when pt's mentation permits.  Pt's husband is living in [place], dtr lives in [place].

## 2014-02-23 NOTE — Progress Notes (Signed)
NURSING PROGRESS NOTE  Desiree BeamsKristy Luhn [id number] Discharge Data: 02/23/2014 3:58 PM Attending Provider: Catarina Hartshornavid Tat, MD PCP:No primary care provider on file.   Desiree BeamsKristy Montanari to be D/C'd Home per MD order.    All IV's will be discontinued and monitored for bleeding.  All belongings will be returned to patient for patient to take home.  Last Documented Vital Signs:  Blood pressure 135/70, pulse 67, temperature 98.3 F (36.8 C), temperature source Oral, resp. rate 16, height 5\' 7"  (1.702 m), weight 50 kg (110 lb 3.7 oz), SpO2 100 %.  [name], MSN, RN, [employer]

## 2014-02-23 NOTE — Discharge Summary (Signed)
Physician Discharge Summary  Desiree BeamsKristy Hale ZOX:[id number] DOB: 1970/10/30 DOA: 01/27/2014  PCP: No primary care provider on file.  Admit date: 01/27/2014 Discharge date: 02/23/2014  Time spent: 60 minutes  Recommendations for Outpatient Follow-up:  1. Infectious Disease Follow up on 11/25 at 3:15 pm. 2. Home health Services ordered including RN for medication management, weekly blood draws and to monitor for pressure ulcers.  We request weekly blood draw results be sent to [facility]. 3. Consider referral to neurology for nerve conduction study if there is no improvement in Lower Extremity strength.  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 She was initially admitted with acute encephalopathy on the hospitalist service, then decompensated from a respiratory standpoint and was intubated 10/28. Patient's respiratory status has gradually improved and was able to be extubated on 11/5, and currently she is comfortable on room air. Her course was complicated by acute renal failure thought to be multifactorial due to nephrotoxic  agents, ATN. Her acute encephalopathy was very slow to improve and she had intermittent periods of confusion alternating with alertness. Since 11/13 she has been at baseline mental status. She was kept in the hospital due to extreme lower extremity weakness.  The primary team tried hard to place her in a rehab facility for physical therapy rehab but were unable to successfully do so.  On 11/16 she received 2 units of packed RBCs for a hgb of 6.3. On the morning of discharge - she is awake, alert, very pleasant, still unable to bear her own weight - but is being discharged to her son's house with the promise of 24 hour support from the family.  Home Health services including RN, Aide, and Social Work have also been ordered. Infectious disease, nephrology, PCCM, Psychiatry and Palliative care have been involved in her care.  Hospital Course:  Acute hypoxemic respiratory failure Resolved. 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 Patient intubated from 10/28 until 11/5. Now breathing comfortably on room air. CXR 11/5 clear.  Pneumocystis pneumonia - Patient received 20 days of clindamycin and primaquine.  These were discontinued by ID.  She will be discharged on Dapsone 100 mg daily indefinitely.  Patient is now asymptomatic. Patient should follow up with his office one time after discharge. Then [facility]  HSV encephalitis - Managed by ID. The patient received 3 weeks of IV acyclovir. Mental status much improved.   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  Both CPK and Aldolase (4.8) are normal so myopathy felt to be less likely. Patient  with profound weakness in her LEs bilaterally but left is more weak than right.  Unable to left legs against gravity.MRI of the spine showed nonspecific perispinal edema.PT Eval completed recommended SNF. [facility] [facility] and plans to go home with her son who agrees to provide 24 hour support.  On 02/18/2014 psychiatry evaluated the patient and felt she had capacity to make her own medical decisions and living arrangements.  Home health RN, SW, Aide are ordered for Desiree Hale.  Unfortunately insurance does not cover PT /OT.  The patient has been advised to follow up with neurology (to consider nerve conduction study)  if her lower extremity weakness does not improved with mobility.  Normocytic Anemia Likely due to Acute illness, AKI, and blood draws. No signs of bleeding or significant bruising. Two units of PRBCs were given 11/16 with appropriate elevation in hgb.  Patient has been started on iron supplementation this admission.  Elevated troponin level - in the setting of demand ischemia and acute renal failure. Patient denies chest pain.  Acute on chronic diastolic heart failure - On beta blocker. No sign of volume overload.   Acute on chronic renal failure with metabolic acidosis - Likely ATN from antibioitics with possible ischemia from hypotension. 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 BUN/CR is down trending (1.92). Bicarb has now normalized. Nephrology  followed her 2x during this hospitalization.   Protein calorie malnutrition - nutrition evaluated.  Ensure supplementation prescribed.  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  The patient is now on appropriate prophylaxis.  Home with azithromycin 1200mg  daily  HIV/AIDS - continue antiretroviral therapy per ID, she is on abacavir, dolutegravir, lamivudine. On fluconazole weekly. Patient has medicaid approval as of 02/07/2014 and there fore will be able to obtain her medications on d/c.  Seizure disorder - stable. Continue Keppra.   Hypertension - Moderately controlled. Added norvasc 11/11.  Continue Labetalol 200 mg daily.  Procedures:  Intubation Extubation  Naso/Gastric tube  Lumbar Puncture  Blood transfusion (2 units)  Consultations:  PCCM  Infectious Disease  Psychiatry  Palliative  Nephrology  Discharge Exam: Filed Vitals:   02/22/14 2356 02/23/14 0025 02/23/14 0331 02/23/14 0546  BP: 147/67 144/77 139/71 150/78  Pulse:  74 59 72  Temp:  99.3 F (37.4 C) 98.1 F (36.7 C) 98.5 F (36.9 C)  TempSrc:  Oral Oral Oral  Resp:   16 18  Height:      Weight:    50 kg (110 lb 3.7 oz)  SpO2:  100% 99% 100%    General: Pt is alert, pleasant, follows commands appropriately, not in acute distress  HEENT: No icterus, No thrush, [place]/AT  Cardiovascular: RRR, S1/S2, no rubs, no gallops  Respiratory:  No wheezing. Good air movement.  Abdomen:  Think Soft/+BS, non tender, non distended, no guarding  Extremities: No edema, No lymphangitis, No petechiae, No rashes, no synovitis.  Lower extremities unable to lift against resistance.  Discharge Instructions   Discharge Instructions    Increase activity slowly    Complete by:  As directed           Current Discharge Medication List    START taking these medications   Details  abacavir (ZIAGEN) 300 MG tablet Take 2 tablets (600 mg total) by mouth daily. Qty: 60 tablet, Refills: 6  amLODipine (NORVASC) 10 MG tablet Take 1 tablet (10 mg total) by mouth daily. Qty: 30 tablet, Refills: 6    aspirin 81 MG chewable tablet Chew 1 tablet (81 mg total) by mouth daily.    azithromycin (ZITHROMAX) 600 MG tablet Take 2 tablets (1,200 mg total) by mouth once a week. Next Dose is Due on 11/24.  Then every Tuesday there after. Qty: 30 tablet, Refills: 6    dapsone 100 MG tablet Take 1 tablet (100 mg total) by mouth daily. Qty: 30 tablet, Refills: 6    dolutegravir (TIVICAY) 50 MG tablet Take 1 tablet (50 mg total) by mouth 2 (two) times daily. Qty: 60 tablet, Refills: 6    feeding supplement, ENSURE  COMPLETE, (ENSURE COMPLETE) LIQD Take 237 mLs by mouth 2 (two) times daily between meals. Qty: 60 Bottle, Refills: 6    fluconazole (DIFLUCAN) 40 MG/ML suspension Take 2.5 mLs (100 mg total) by mouth once a week. Take every Tuesday. Qty: 35 mL, Refills: 0    HYDROcodone-acetaminophen (NORCO/VICODIN) 5-325 MG per tablet Take 1 tablet by mouth every 6 (six) hours as needed for moderate pain. Qty: 30 tablet, Refills: 0    iron polysaccharides (NIFEREX) 150 MG capsule Take 1 capsule (150 mg total) by mouth daily. Qty: 30 capsule, Refills: 6    labetalol (NORMODYNE) 200 MG tablet Take 1 tablet (200 mg total) by mouth 2 (two) times daily. Qty: 60 tablet, Refills: 6    lamiVUDine (EPIVIR) 150 MG tablet Take 1 tablet (150 mg total) by mouth 2 (two) times daily. Qty: 60 tablet, Refills: 6    levETIRAcetam (KEPPRA) 500 MG tablet Take 1 tablet (500 mg total) by mouth 2 (two) times daily. Qty: 60 tablet, Refills: 6    pantoprazole (PROTONIX) 40 MG tablet Take 1 tablet (40 mg total) by mouth daily. Qty: 30 tablet, Refills: 6      CONTINUE these medications which have NOT CHANGED   Details  Ibuprofen-Diphenhydramine HCl (ADVIL PM) 200-25 MG CAPS Take 1 tablet by mouth at bedtime as needed (for sleep and pain).      STOP taking these medications     gabapentin (NEURONTIN) 300 MG capsule        Allergies  Allergen Reactions  . Ace Inhibitors Anaphylaxis  . Oxycodone Other (See Comments)   Follow-up Information    Follow up with [facility].   Why:  RN,  HHA   Contact information:   [address] [place] [address] [phone]       Follow up with [facility].   Why:  wheelchair, rolling walker, hoyer lift, bedside commode   Contact information:   [address] [place] [address] [phone]       Follow up with [facility]              On 03/03/2014.   Why:  Arrive at 3:15 pm to see Dr. Daiva Eves   Contact information:   [address] [place] [address] [place] [address]        The results of significant diagnostics from this hospitalization (including imaging, microbiology, ancillary and laboratory) are listed below for reference.    Significant Diagnostic Studies: Ct Head Wo Contrast  01/27/2014   CLINICAL DATA:  Found unresponsive.  Altered mental status.  EXAM: CT HEAD WITHOUT CONTRAST  CT CERVICAL SPINE WITHOUT CONTRAST  TECHNIQUE: Multidetector CT imaging of the head and  cervical spine was performed following the standard protocol without intravenous contrast. Multiplanar CT image reconstructions of the cervical spine were also generated.  COMPARISON:  None.  FINDINGS: CT HEAD FINDINGS  The ventricles are normal in size and configuration. No extra-axial fluid collections are identified. The gray-white differentiation is normal. No CT findings for acute intracranial process such as hemorrhage or infarction. No mass lesions. The brainstem and cerebellum are grossly normal.  The bony structures are intact. The paranasal sinuses and mastoid air cells are clear. The globes are intact.  CT CERVICAL SPINE FINDINGS  Normal alignment of the cervical vertebral bodies. Disc spaces and vertebral bodies are maintained. No acute fracture or abnormal prevertebral soft tissue swelling. The facets are normally aligned. The skullbase C1 and C1-2 articulations are maintained. The dens is normal. No large disc protrusions, spinal or foraminal stenosis. The lung apices are clear. Emphysematous changes are noted. There is some air in the supraclavicular fossa possibly due to a laceration. No pneumothorax is identified.  IMPRESSION: No acute intracranial findings or skull fracture.  Normal CT examination of the cervical spine. No cervical spine fracture.   Electronically Signed   By: Loralie Champagne M.D.   On: 01/27/2014 17:27   Ct Cervical Spine Wo Contrast  01/27/2014   CLINICAL DATA:   Found unresponsive.  Altered mental status.  EXAM: CT HEAD WITHOUT CONTRAST  CT CERVICAL SPINE WITHOUT CONTRAST  TECHNIQUE: Multidetector CT imaging of the head and cervical spine was performed following the standard protocol without intravenous contrast. Multiplanar CT image reconstructions of the cervical spine were also generated.  COMPARISON:  None.  FINDINGS: CT HEAD FINDINGS  The ventricles are normal in size and configuration. No extra-axial fluid collections are identified. The gray-white differentiation is normal. No CT findings for acute intracranial process such as hemorrhage or infarction. No mass lesions. The brainstem and cerebellum are grossly normal.  The bony structures are intact. The paranasal sinuses and mastoid air cells are clear. The globes are intact.  CT CERVICAL SPINE FINDINGS  Normal alignment of the cervical vertebral bodies. Disc spaces and vertebral bodies are maintained. No acute fracture or abnormal prevertebral soft tissue swelling. The facets are normally aligned. The skullbase C1 and C1-2 articulations are maintained. The dens is normal. No large disc protrusions, spinal or foraminal stenosis. The lung apices are clear. Emphysematous changes are noted. There is some air in the supraclavicular fossa possibly due to a laceration. No pneumothorax is identified.  IMPRESSION: No acute intracranial findings or skull fracture.  Normal CT examination of the cervical spine. No cervical spine fracture.   Electronically Signed   By: Loralie Champagne M.D.   On: 01/27/2014 17:27   Mr Brain Wo Contrast  01/28/2014   CLINICAL DATA:  Unresponsive. The patient had an episode with loss of consciousness. She has been aphasic since that episode despite regaining who consciousness otherwise.  EXAM: MRI HEAD WITHOUT CONTRAST  TECHNIQUE: Multiplanar, multiecho pulse sequences of the brain and surrounding structures were obtained without intravenous contrast.  COMPARISON:  CT head without contrast  01/27/2014.  FINDINGS: Abnormal cortical and subcortical T2 hyperintensity is evident in the posterior insular cortex and along the sylvian fissure bilaterally. There is abnormal signal extending into the operculum bilaterally, left greater than right. This signal abnormality extends nearly to the vertex within the precentral sulcus. There is no significant associated restricted diffusion. No hemorrhage or mass lesion is present. There is no significant mass effect.  Flow is present in the major  intracranial arteries. The globes and orbits are intact. Mild mucosal thickening is noted in the ethmoid air cells and sphenoid sinuses bilaterally.  The coronal T2 weighted images are somewhat degraded by patient motion. No definite hippocampal abnormality is evident.  IMPRESSION: 1. Diffuse cortical and subcortical signal abnormality bilaterally within the temporal and posterior frontal lobes as described. This is most likely a postictal phenomenon. There is no restricted diffusion to suggest acute or subacute ischemia. No mass lesion is evident. This could be related to elevated blood pressure similar to posterior reversible encephalopathy syndrome. The distribution is atypical. Recommend follow-up MRI of the brain without and with contrast in 2-3 weeks as the symptoms resolve. 2. Minimal sinus disease.   Electronically Signed   By: Gennette Pac M.D.   On: 01/28/2014 10:27   Mr Lumbar Spine Wo Contrast  02/18/2014   CLINICAL DATA:  Weakness in bilateral lower extremities. Encephalopathy.  EXAM: MRI LUMBAR SPINE WITHOUT CONTRAST  TECHNIQUE: Multiplanar, multisequence MR imaging of the lumbar spine was performed. No intravenous contrast was administered.  COMPARISON:  None.  FINDINGS: Examination is quite limited due to patient's condition. He would not cooperate with any further imaging.  The T2 sagittal sequence demonstrates normal alignment of the lumbar vertebral bodies. They demonstrate grossly normal signal  intensity. The intervertebral disc spaces are maintained with normal T2 signal intensity. The conus medullaris terminates at L1. The spinal canal is quite generous. There are no disc protrusions, spinal or foraminal stenosis. No findings for diskitis or osteomyelitis. No findings for septic facet arthritis.  There is diffuse edema like signal abnormality in paraspinal muscles which is nonspecific myositis. This could be inflammatory such as polymyositis or dermatomyositis, posttraumatic, drug induced, etc.  IMPRESSION: Normal alignment of the lumbar vertebral bodies and no acute bony findings.  No disc protrusions, spinal or foraminal stenosis.  Nonspecific edema like signal abnormality in the paraspinal muscles as discussed above.   Electronically Signed   By: Loralie Champagne M.D.   On: 02/18/2014 17:27   US Renal  02/05/2014   CLINICAL DATA:  Acute renal insufficiency  EXAM: RENAL/URINARY TRACT ULTRASOUND COMPLETE  COMPARISON:  None.  FINDINGS: Right Kidney:  Length: 10.0 cm. Echogenicity is diffusely increased. The renal cortical thickness is normal. There is no appreciable mass or perinephric fluid. There is slight fullness of the renal collecting system without focal obstruction site seen. There is no sonographically demonstrable calculus or ureterectasis.  Left Kidney:  Length: 10.4 cm. Echogenicity is diffusely increased. Renal cortical thickness is normal. No mass, pelvicaliectasis, or perinephric fluid collection. There is no sonographically demonstrable calculus or ureterectasis. Visualized.  Bladder:  Appears normal for degree of bladder distention. A Foley catheter is present within the urinary bladder. There remains some urine in the urinary bladder currently, however.  IMPRESSION: Both kidneys are diffusely echogenic, a finding that may be seen with medical renal disease. The renal cortical thickness is normal bilaterally. There is slight fullness of the right renal collecting system of uncertain  etiology. No pelvicaliectasis is seen on the left. No renal masses are identified on either side.   Electronically Signed   By: Bretta Bang M.D.   On: 02/05/2014 16:42    Portable Chest Xray  02/04/2014   CLINICAL DATA:  Line placement.  Respiratory failure.  EXAM: PORTABLE CHEST - 1 VIEW  COMPARISON:  02/03/2014 at 7:34 a.m.  FINDINGS: New left IJ catheter, tip at the upper cavoatrial junction. No evidence of pneumothorax.  New endotracheal tube,  tip 1 cm above the carina. Feeding tube remains at least in the stomach.  Greater lung volumes after intubation. Bilateral airspace disease persists, focal in the right infrahilar lung can suspicious for pneumonia. No evidence of effusion or pneumothorax.  IMPRESSION: 1. New endotracheal tube, tip 1 cm above the carina. 2. New left IJ catheter is in good position.  No pneumothorax. 3. Improved lung volumes with persistent bilateral airspace disease (likely pneumonia).   Electronically Signed   By: Tiburcio Pea M.D.   On: 02/04/2014 01:07    Dg Chest Port 1 View  02/01/2014   CLINICAL DATA:  Fever, aspiration pneumonia.  EXAM: PORTABLE CHEST - 1 VIEW  COMPARISON:  01/29/2014.  FINDINGS: Feeding tube is followed into the stomach with the tip projecting beyond the inferior margin of the image. Trachea is midline. Heart size stable. There is worsening bilateral airspace disease, somewhat perihilar predominant, right greater than left. No definite pleural fluid. No pneumothorax.  IMPRESSION: Worsening bilateral airspace disease, somewhat perihilar predominant, right greater than left, suspicious for pneumonia. Aspiration not excluded.   Electronically Signed   By: Leanna Battles M.D.   On: 02/01/2014 15:26    Dg Abd Portable 1v  01/31/2014   CLINICAL DATA:  NG tube placement.  EXAM: PORTABLE ABDOMEN - 1 VIEW  COMPARISON:  None.  FINDINGS: Bowel gas pattern is nonobstructive. A feeding tube follows the greater curvature of the stomach and terminates in  the expected location of the antrum/pylorus.  IMPRESSION: Feeding tube terminates in the expected location of the antrum/pylorus.   Electronically Signed   By: Britta Mccreedy M.D.   On: 01/31/2014 18:58   Dg Fluoro Guide Lumbar Puncture  01/30/2014   CLINICAL DATA:  43 year old female with fever and altered mental status. Suspicious for encephalitis.  EXAM: DIAGNOSTIC LUMBAR PUNCTURE UNDER FLUOROSCOPIC GUIDANCE  FLUOROSCOPY TIME:  51 seconds  PROCEDURE: Informed consent was obtained from the patient prior to the procedure, including potential complications of headache, allergy, and pain. With the patient prone, the lower back was prepped with Betadine. 1% Lidocaine was used for local anesthesia. Lumbar puncture was performed at the L3-L4 level using a 20 gauge needle with return of yellow tinged CSF with an opening pressure of 17 cm water. 10 ml of CSF were obtained for laboratory studies. The patient tolerated the procedure well and there were no apparent complications.  IMPRESSION: 1. Successful uncomplicated fluoroscopic guided lumbar puncture at L3-L4 with opening pressure of 17 cm of water, yielding consistently light yellow tinged CSF.   Electronically Signed   By: Trudie Reed M.D.   On: 01/30/2014 17:02    Microbiology: Recent Results (from the past 240 hour(s))  Clostridium Difficile by PCR     Status: None   Collection Time: 02/17/14  2:36 AM  Result Value Ref Range Status   C difficile by pcr NEGATIVE NEGATIVE Final     Labs: Basic Metabolic Panel:  Recent Labs Lab 02/17/14 0544 02/18/14 0358 02/22/14 0520 02/23/14 0624  NA 145  143 144 139 137  K 4.2  4.1 3.9 3.6* 3.2*  CL 109  107 107 105 101  CO2 21  21 23 22 20   GLUCOSE 77  74 75 88 97  BUN 99*  101* 96* 52* 41*  CREATININE 3.18*  3.14* 2.92* 1.92* 1.75*  CALCIUM 8.4  8.5 8.4 8.1* 8.1*  MG  --   --   --  1.3*  PHOS 7.1*  --   --   --  Liver Function Tests:  Recent Labs Lab 02/17/14 0544  02/18/14 0358 02/22/14 0520  AST  --  20 13  ALT  --  36* 18  ALKPHOS  --  62 70  BILITOT  --  0.4 0.4  PROT  --  5.1* 5.5*  ALBUMIN 1.7* 1.9* 1.8*   CBC:  Recent Labs Lab 02/18/14 0358 02/22/14 1215 02/23/14 0624  WBC 5.5 6.2 5.3  NEUTROABS  --  5.4  --   HGB 7.5* 6.3* 8.9*  HCT 22.9* 19.1* 25.9*  MCV 79.5 82.3 83.3  PLT 181 172 147*   Cardiac Enzymes:  Recent Labs Lab 02/18/14 0855  CKTOTAL 49   CBG:  Recent Labs Lab 02/22/14 1144 02/22/14 2011 02/23/14 0011 02/23/14 0426 02/23/14 0755  GLUCAP 99 92 100* 86 93       Signed:  Stephani Police, PA-C  [facility] 02/23/2014, 10:58 AM  Attending  Patient was seen, examined,treatment plan was discussed with the Physician extender. I have directly reviewed the clinical findings, lab, imaging studies and management of this patient in detail. I have made the necessary changes to the above noted documentation, and agree with the documentation, as recorded by the Physician extender.  Catarina Hartshorn, DO [phone]

## 2014-03-01 LAB — FUNGUS CULTURE W SMEAR: Fungal Smear: NONE SEEN

## 2014-03-03 ENCOUNTER — Encounter (HOSPITAL_COMMUNITY): Payer: Self-pay | Admitting: Emergency Medicine

## 2014-03-03 ENCOUNTER — Ambulatory Visit: Payer: Medicaid Other | Admitting: Infectious Disease

## 2014-03-14 LAB — AFB CULTURE, BLOOD

## 2014-03-22 LAB — AFB CULTURE WITH SMEAR (NOT AT ARMC)

## 2014-04-28 ENCOUNTER — Telehealth: Payer: Self-pay | Admitting: *Deleted

## 2014-04-28 NOTE — Telephone Encounter (Signed)
Needing signed order to run sensitivities.  Please fax signed order to [facility], [phone].

## 2014-05-03 ENCOUNTER — Telehealth: Payer: Self-pay

## 2014-05-03 NOTE — Telephone Encounter (Signed)
Nannette, from [facility]. Calling for orders Dr Jenny ReichmannSider added to sputum culture.    Order needed for culture sensitivities form 02-04-14 .  Fax: [phone]  Orders Sent  Via faxed.   Verbal orders/read back /per Dr Patrecia PaceSnider    Tammy K Slayden, RN

## 2014-05-14 NOTE — Telephone Encounter (Signed)
error 

## 2014-05-26 LAB — AFB CULTURE WITH SMEAR (NOT AT ARMC)

## 2015-04-20 IMAGING — US US RENAL
1 series · 13 of 25 positions shown · non-contrast
Comparison: None.

CLINICAL DATA: Acute renal insufficiency

EXAM:
RENAL/URINARY TRACT ULTRASOUND COMPLETE

[Series 1: us renal · 0.20mm/px · 13 of 34 slices shown]
[im 1/34]
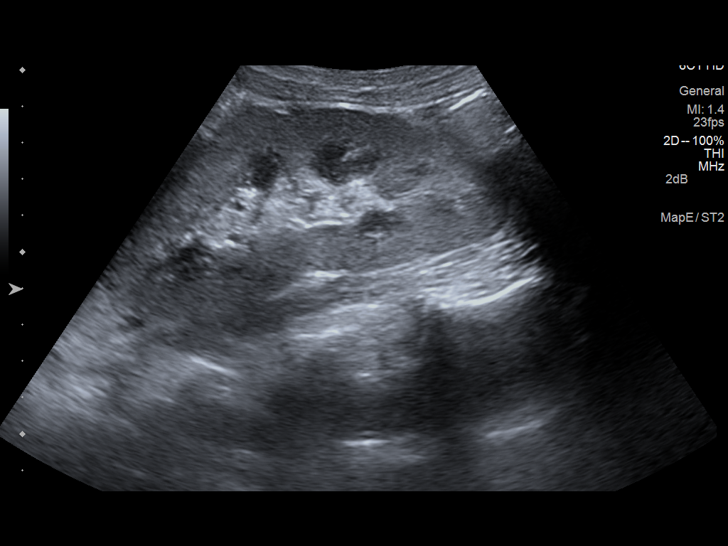
[im 3/34]
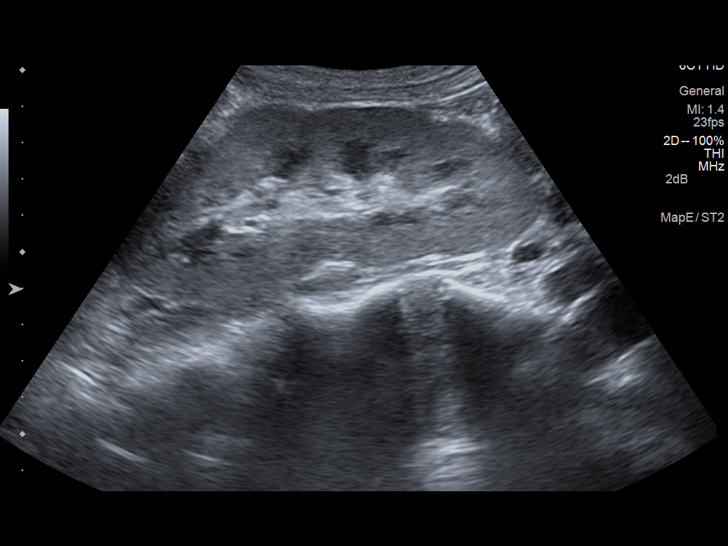
[im 6/34]
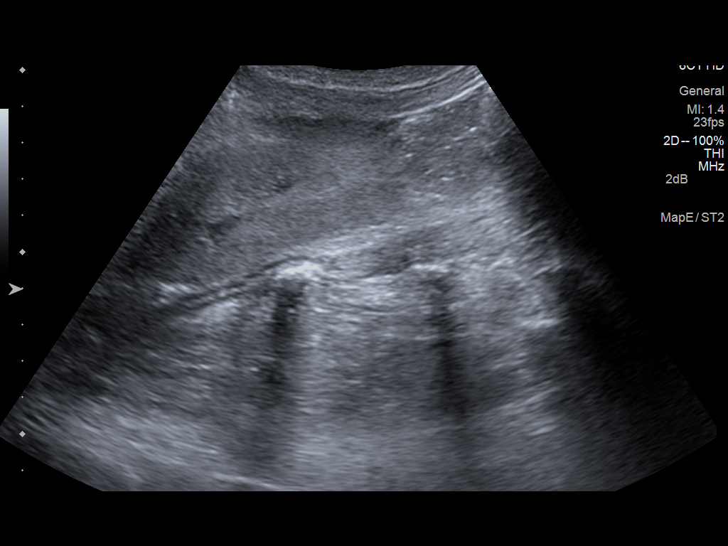
[im 9/34]
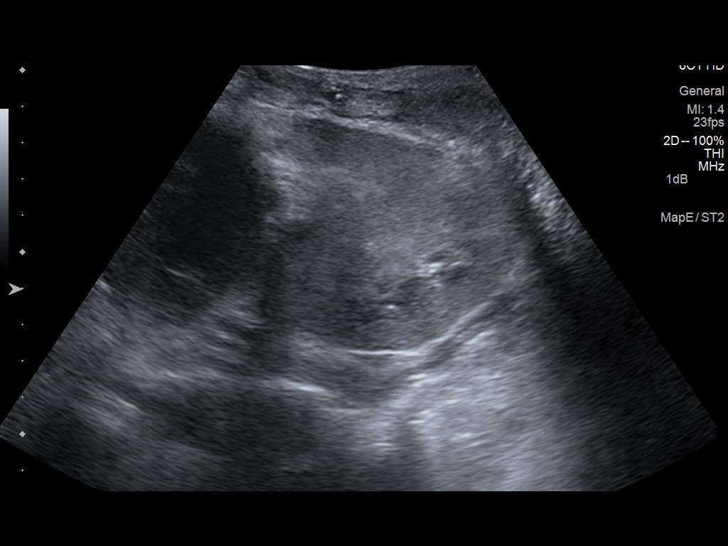
[im 12/34]
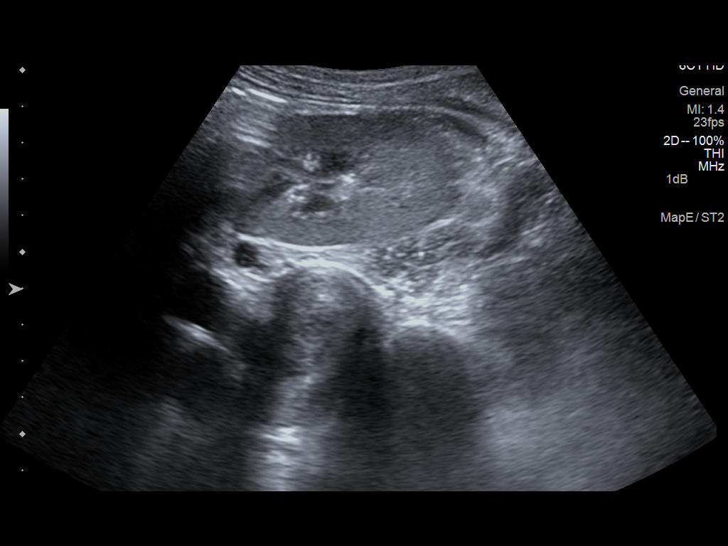
[im 14/34]
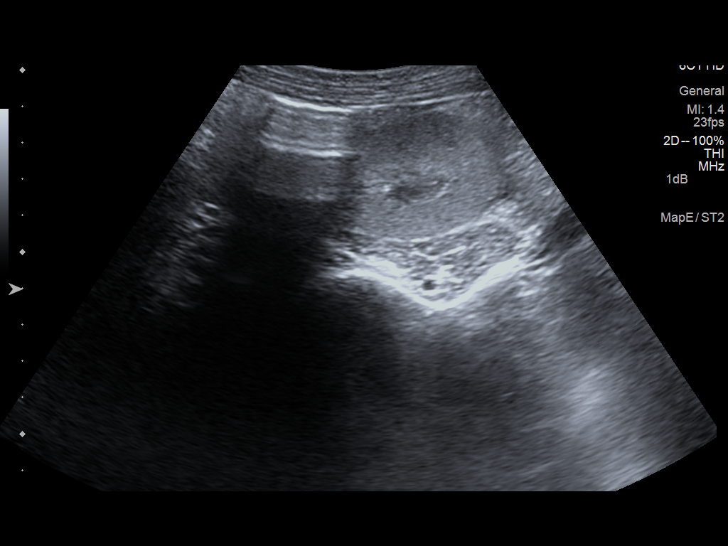
[im 17/34]
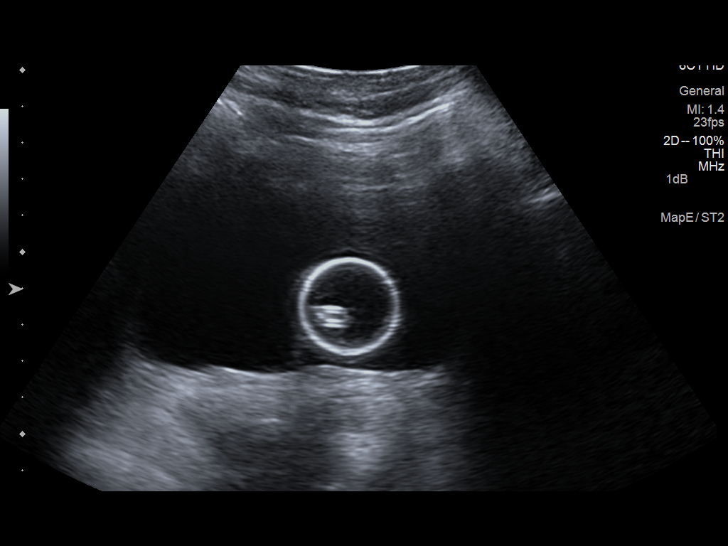
[im 20/34]
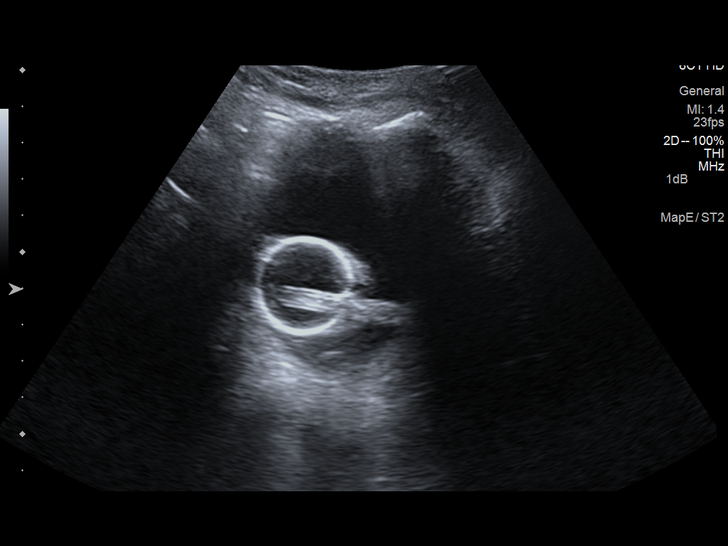
[im 23/34]
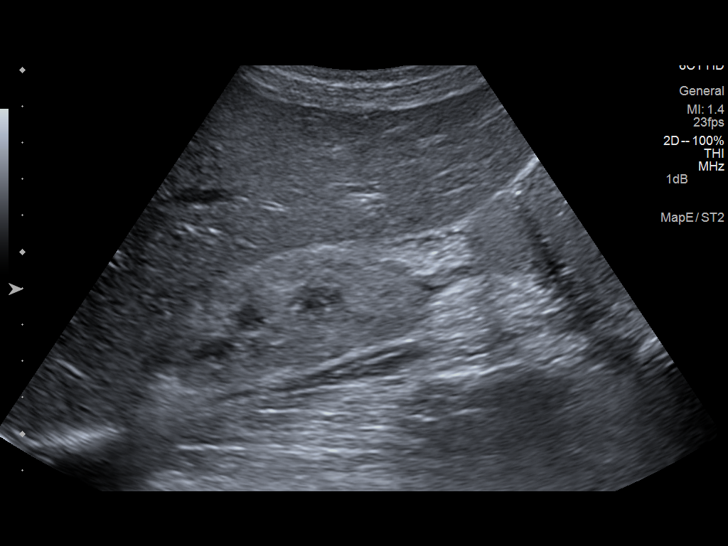
[im 25/34]
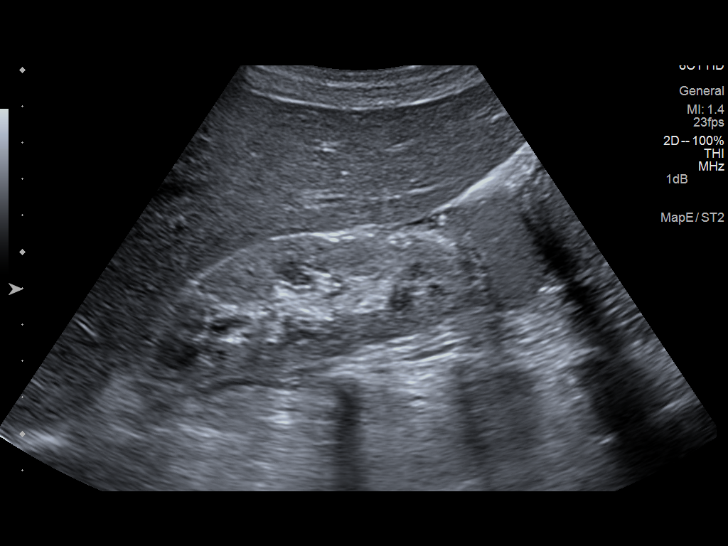
[im 28/34]
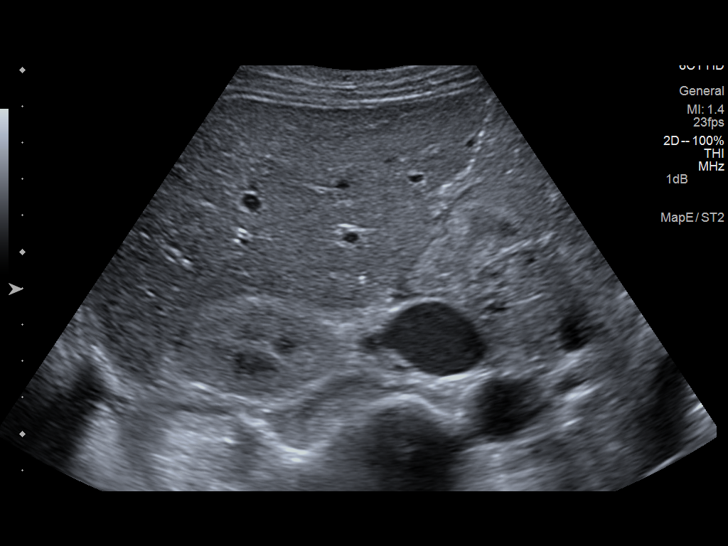
[im 31/34]
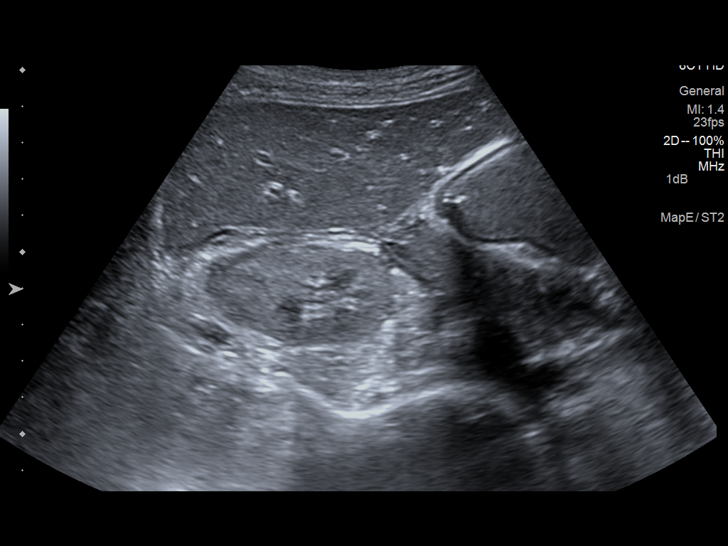
[im 34/34]
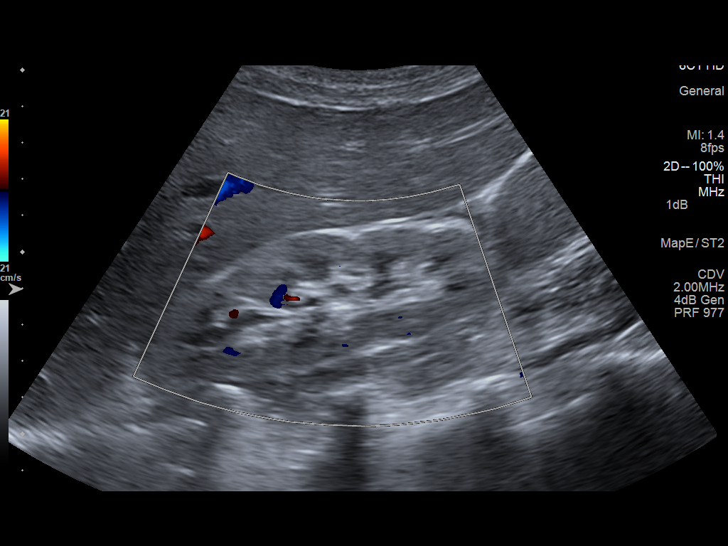

[13 of 25 positions shown; findings below may reference images not displayed]

FINDINGS: Right Kidney:

Length: 10.0 cm. Echogenicity is diffusely increased. The renal
cortical thickness is normal. There is no appreciable mass or
perinephric fluid. There is slight fullness of the renal collecting
system without focal obstruction site seen. There is no
sonographically demonstrable calculus or ureterectasis.

Left Kidney:

Length: 10.4 cm. Echogenicity is diffusely increased. Renal cortical
thickness is normal. No mass, pelvicaliectasis, or perinephric fluid
collection. There is no sonographically demonstrable calculus or
ureterectasis. Visualized.

Bladder:

Appears normal for degree of bladder distention. A Foley catheter is
present within the urinary bladder. There remains some urine in the
urinary bladder currently, however.
IMPRESSION: Both kidneys are diffusely echogenic, a finding that may be seen
with medical renal disease. The renal cortical thickness is normal
bilaterally. There is slight fullness of the right renal collecting
system of uncertain etiology. No pelvicaliectasis is seen on the
left. No renal masses are identified on either side.

## 2015-04-26 IMAGING — CR DG CHEST 1V PORT
1 series · 1 of 1 positions shown · non-contrast
Comparison: Portable chest x-ray February 10, 2014.

CLINICAL DATA: Ventilated patient; history of HIV knee, acute
respiratory failure and aspiration pneumonia

EXAM:
PORTABLE CHEST - 1 VIEW

[AP]
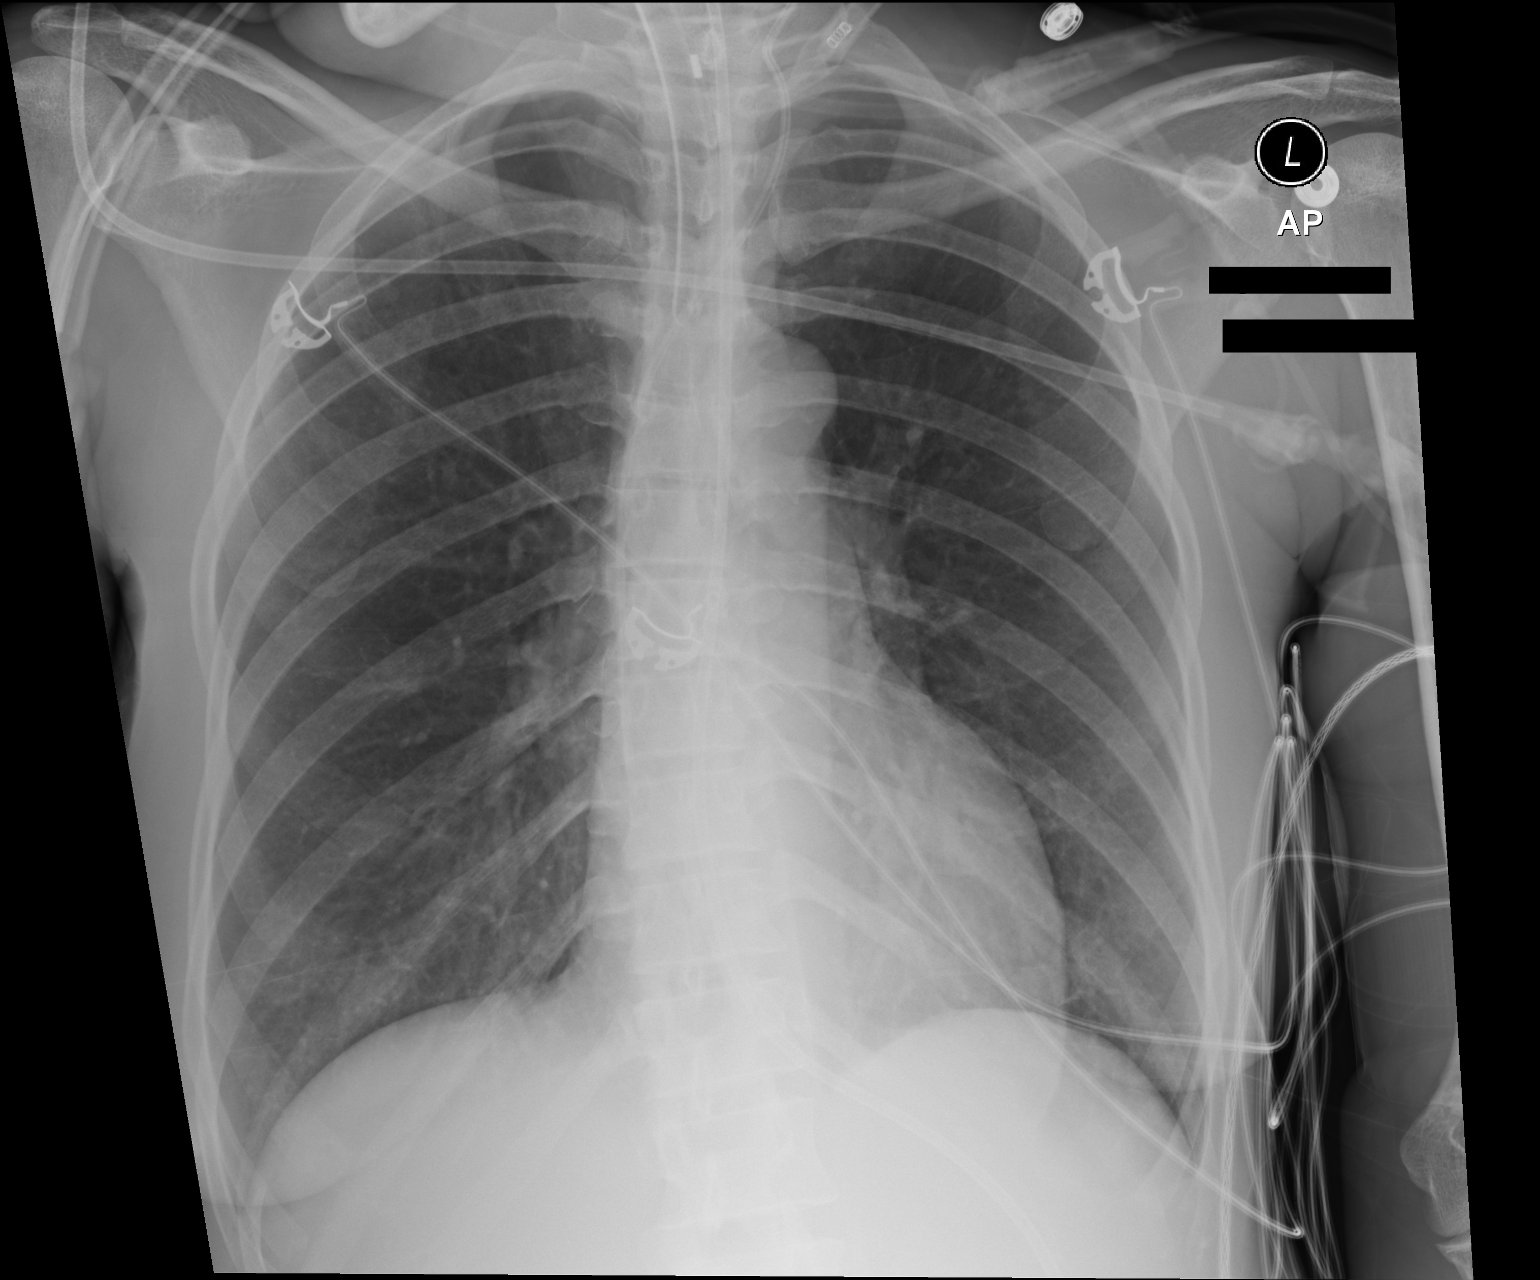

[1 of 1 positions shown; findings below may reference images not displayed]

FINDINGS: The lungs are well-expanded and clear. The heart and mediastinal
structures are normal. There is no pleural effusion or pneumothorax.
The endotracheal tube tip lies 5.1 cm above the crotch of the
carina. The left internal jugular venous catheter tip projects over
the mid to distal SVC. The feeding tube tip projects below the
inferior margin of the image. The bony thorax is unremarkable.
IMPRESSION: There is no evidence of pneumonia nor other acute cardiopulmonary
abnormality.

## 2023-02-08 DEATH — deceased
